# Patient Record
Sex: Female | Born: 1935 | Race: Black or African American | Hispanic: No | Smoking: Never smoker
Health system: Southern US, Community
[De-identification: ages and names within clinical notes are randomized; demographics above are authoritative.]

## PROBLEM LIST (undated history)

## (undated) DIAGNOSIS — I739 Peripheral vascular disease, unspecified: Secondary | ICD-10-CM

## (undated) DIAGNOSIS — R001 Bradycardia, unspecified: Secondary | ICD-10-CM

## (undated) DIAGNOSIS — I82409 Acute embolism and thrombosis of unspecified deep veins of unspecified lower extremity: Secondary | ICD-10-CM

## (undated) DIAGNOSIS — M353 Polymyalgia rheumatica: Secondary | ICD-10-CM

## (undated) DIAGNOSIS — N3941 Urge incontinence: Secondary | ICD-10-CM

## (undated) DIAGNOSIS — J309 Allergic rhinitis, unspecified: Secondary | ICD-10-CM

## (undated) DIAGNOSIS — G5790 Unspecified mononeuropathy of unspecified lower limb: Secondary | ICD-10-CM

## (undated) DIAGNOSIS — I6529 Occlusion and stenosis of unspecified carotid artery: Secondary | ICD-10-CM

## (undated) DIAGNOSIS — I10 Essential (primary) hypertension: Secondary | ICD-10-CM

## (undated) DIAGNOSIS — R6251 Failure to thrive (child): Secondary | ICD-10-CM

## (undated) DIAGNOSIS — R0600 Dyspnea, unspecified: Secondary | ICD-10-CM

## (undated) DIAGNOSIS — N189 Chronic kidney disease, unspecified: Secondary | ICD-10-CM

## (undated) DIAGNOSIS — M1990 Unspecified osteoarthritis, unspecified site: Secondary | ICD-10-CM

## (undated) DIAGNOSIS — F320 Major depressive disorder, single episode, mild: Secondary | ICD-10-CM

## (undated) DIAGNOSIS — R42 Dizziness and giddiness: Secondary | ICD-10-CM

## (undated) DIAGNOSIS — R1011 Right upper quadrant pain: Secondary | ICD-10-CM

## (undated) DIAGNOSIS — I679 Cerebrovascular disease, unspecified: Secondary | ICD-10-CM

## (undated) DIAGNOSIS — F32A Depression, unspecified: Secondary | ICD-10-CM

## (undated) DIAGNOSIS — E785 Hyperlipidemia, unspecified: Secondary | ICD-10-CM

## (undated) DIAGNOSIS — N318 Other neuromuscular dysfunction of bladder: Secondary | ICD-10-CM

## (undated) HISTORY — DX: Chronic kidney disease, unspecified: N18.9

## (undated) HISTORY — DX: Essential (primary) hypertension: I10

## (undated) HISTORY — DX: Cerebrovascular disease, unspecified: I67.9

## (undated) HISTORY — DX: Unspecified mononeuropathy of unspecified lower limb: G57.90

## (undated) HISTORY — DX: Hyperlipidemia, unspecified: E78.5

## (undated) HISTORY — DX: Occlusion and stenosis of unspecified carotid artery: I65.29

## (undated) HISTORY — DX: Major depressive disorder, single episode, mild: F32.0

## (undated) HISTORY — DX: Acute embolism and thrombosis of unspecified deep veins of unspecified lower extremity: I82.409

## (undated) HISTORY — DX: Depression, unspecified: F32.A

## (undated) HISTORY — DX: Urge incontinence: N39.41

## (undated) HISTORY — DX: Failure to thrive (child): R62.51

## (undated) HISTORY — DX: Allergic rhinitis, unspecified: J30.9

## (undated) HISTORY — DX: Peripheral vascular disease, unspecified: I73.9

## (undated) HISTORY — DX: Right upper quadrant pain: R10.11

## (undated) HISTORY — DX: Polymyalgia rheumatica: M35.3

## (undated) HISTORY — DX: Other neuromuscular dysfunction of bladder: N31.8

## (undated) HISTORY — DX: Dyspnea, unspecified: R06.00

## (undated) HISTORY — DX: Bradycardia, unspecified: R00.1

## (undated) HISTORY — DX: Dizziness and giddiness: R42

## (undated) HISTORY — DX: Unspecified osteoarthritis, unspecified site: M19.90

## (undated) HISTORY — PX: OTHER SURGICAL HISTORY: SHX169

## (undated) HISTORY — PX: CATARACT EXTRACTION: SUR2

---

## 1998-06-04 HISTORY — PX: CHOLECYSTECTOMY: SHX55

## 1999-07-06 HISTORY — PX: OTHER SURGICAL HISTORY: SHX169

## 1999-07-06 LAB — HM DEXA SCAN

## 2000-06-04 HISTORY — PX: OTHER SURGICAL HISTORY: SHX169

## 2000-09-02 HISTORY — PX: OTHER SURGICAL HISTORY: SHX169

## 2000-10-02 HISTORY — PX: OTHER SURGICAL HISTORY: SHX169

## 2002-05-19 LAB — FECAL OCCULT BLOOD, GUAIAC: Fecal Occult Blood: NEGATIVE

## 2002-06-04 HISTORY — PX: COLONOSCOPY: SHX174

## 2002-06-04 LAB — HM COLONOSCOPY

## 2002-11-03 HISTORY — PX: OTHER SURGICAL HISTORY: SHX169

## 2004-05-05 ENCOUNTER — Ambulatory Visit: Payer: Self-pay | Admitting: Family Medicine

## 2004-11-07 ENCOUNTER — Ambulatory Visit: Payer: Self-pay | Admitting: Family Medicine

## 2004-11-10 LAB — HM MAMMOGRAPHY: HM Mammogram: NORMAL

## 2004-11-29 ENCOUNTER — Ambulatory Visit: Payer: Self-pay | Admitting: Family Medicine

## 2004-12-08 ENCOUNTER — Ambulatory Visit: Payer: Self-pay | Admitting: Family Medicine

## 2004-12-22 ENCOUNTER — Ambulatory Visit: Payer: Self-pay | Admitting: Family Medicine

## 2005-01-02 HISTORY — PX: OTHER SURGICAL HISTORY: SHX169

## 2005-01-03 ENCOUNTER — Ambulatory Visit: Payer: Self-pay | Admitting: Cardiology

## 2005-01-19 ENCOUNTER — Ambulatory Visit: Payer: Self-pay

## 2005-02-02 ENCOUNTER — Ambulatory Visit: Payer: Self-pay | Admitting: Cardiology

## 2005-02-16 ENCOUNTER — Ambulatory Visit: Payer: Self-pay | Admitting: Cardiology

## 2005-02-16 ENCOUNTER — Ambulatory Visit: Payer: Self-pay

## 2005-02-22 ENCOUNTER — Ambulatory Visit: Payer: Self-pay | Admitting: Family Medicine

## 2005-04-09 ENCOUNTER — Ambulatory Visit: Payer: Self-pay | Admitting: Family Medicine

## 2005-05-01 ENCOUNTER — Ambulatory Visit: Payer: Self-pay | Admitting: Family Medicine

## 2005-07-04 ENCOUNTER — Ambulatory Visit: Payer: Self-pay | Admitting: Family Medicine

## 2005-07-30 ENCOUNTER — Ambulatory Visit: Payer: Self-pay | Admitting: Cardiology

## 2005-10-08 ENCOUNTER — Ambulatory Visit: Payer: Self-pay | Admitting: Family Medicine

## 2005-10-09 ENCOUNTER — Ambulatory Visit: Payer: Self-pay | Admitting: Family Medicine

## 2005-12-10 ENCOUNTER — Ambulatory Visit: Payer: Self-pay | Admitting: Family Medicine

## 2006-02-11 ENCOUNTER — Ambulatory Visit: Payer: Self-pay | Admitting: Family Medicine

## 2006-03-04 ENCOUNTER — Ambulatory Visit: Payer: Self-pay | Admitting: Cardiology

## 2006-03-04 ENCOUNTER — Ambulatory Visit: Payer: Self-pay

## 2006-04-23 ENCOUNTER — Ambulatory Visit: Payer: Self-pay | Admitting: Family Medicine

## 2006-04-29 ENCOUNTER — Ambulatory Visit: Payer: Self-pay | Admitting: Family Medicine

## 2006-09-09 ENCOUNTER — Ambulatory Visit: Payer: Self-pay | Admitting: Cardiology

## 2006-10-11 ENCOUNTER — Ambulatory Visit: Payer: Self-pay | Admitting: Family Medicine

## 2006-10-11 ENCOUNTER — Encounter: Payer: Self-pay | Admitting: Family Medicine

## 2006-10-11 DIAGNOSIS — I6529 Occlusion and stenosis of unspecified carotid artery: Secondary | ICD-10-CM | POA: Insufficient documentation

## 2006-10-11 DIAGNOSIS — I10 Essential (primary) hypertension: Secondary | ICD-10-CM | POA: Insufficient documentation

## 2006-10-11 DIAGNOSIS — N318 Other neuromuscular dysfunction of bladder: Secondary | ICD-10-CM | POA: Insufficient documentation

## 2006-10-11 DIAGNOSIS — I679 Cerebrovascular disease, unspecified: Secondary | ICD-10-CM | POA: Insufficient documentation

## 2006-10-11 DIAGNOSIS — N189 Chronic kidney disease, unspecified: Secondary | ICD-10-CM | POA: Insufficient documentation

## 2006-10-11 DIAGNOSIS — G5790 Unspecified mononeuropathy of unspecified lower limb: Secondary | ICD-10-CM | POA: Insufficient documentation

## 2006-10-11 DIAGNOSIS — I519 Heart disease, unspecified: Secondary | ICD-10-CM | POA: Insufficient documentation

## 2006-10-11 DIAGNOSIS — N184 Chronic kidney disease, stage 4 (severe): Secondary | ICD-10-CM | POA: Insufficient documentation

## 2006-10-11 DIAGNOSIS — N183 Chronic kidney disease, stage 3 unspecified: Secondary | ICD-10-CM | POA: Insufficient documentation

## 2006-10-11 DIAGNOSIS — M1990 Unspecified osteoarthritis, unspecified site: Secondary | ICD-10-CM | POA: Insufficient documentation

## 2006-10-11 DIAGNOSIS — J309 Allergic rhinitis, unspecified: Secondary | ICD-10-CM | POA: Insufficient documentation

## 2006-10-11 DIAGNOSIS — I739 Peripheral vascular disease, unspecified: Secondary | ICD-10-CM | POA: Insufficient documentation

## 2006-10-11 DIAGNOSIS — M353 Polymyalgia rheumatica: Secondary | ICD-10-CM | POA: Insufficient documentation

## 2006-10-11 DIAGNOSIS — Z8679 Personal history of other diseases of the circulatory system: Secondary | ICD-10-CM | POA: Insufficient documentation

## 2006-10-11 DIAGNOSIS — E785 Hyperlipidemia, unspecified: Secondary | ICD-10-CM | POA: Insufficient documentation

## 2006-10-14 LAB — CONVERTED CEMR LAB
ALT: 19 units/L (ref 0–40)
AST: 32 units/L (ref 0–37)
Folate: 16.7 ng/mL
LDL Cholesterol: 114 mg/dL — ABNORMAL HIGH (ref 0–99)
Total CHOL/HDL Ratio: 4.7
Triglycerides: 78 mg/dL (ref 0–149)

## 2007-03-19 ENCOUNTER — Encounter: Payer: Self-pay | Admitting: Family Medicine

## 2007-03-21 ENCOUNTER — Ambulatory Visit: Payer: Self-pay | Admitting: Family Medicine

## 2007-07-25 ENCOUNTER — Telehealth: Payer: Self-pay | Admitting: Family Medicine

## 2007-09-03 DIAGNOSIS — I6529 Occlusion and stenosis of unspecified carotid artery: Secondary | ICD-10-CM

## 2007-09-03 HISTORY — DX: Occlusion and stenosis of unspecified carotid artery: I65.29

## 2007-09-09 ENCOUNTER — Ambulatory Visit: Payer: Self-pay

## 2007-09-09 ENCOUNTER — Ambulatory Visit: Payer: Self-pay | Admitting: Internal Medicine

## 2008-02-05 ENCOUNTER — Ambulatory Visit: Payer: Self-pay | Admitting: Internal Medicine

## 2008-02-27 ENCOUNTER — Ambulatory Visit: Payer: Self-pay | Admitting: Family Medicine

## 2008-02-27 DIAGNOSIS — N3941 Urge incontinence: Secondary | ICD-10-CM | POA: Insufficient documentation

## 2008-02-27 LAB — CONVERTED CEMR LAB
Bilirubin Urine: NEGATIVE
Blood in Urine, dipstick: NEGATIVE
Ketones, urine, test strip: NEGATIVE
Nitrite: POSITIVE
Specific Gravity, Urine: <1.005

## 2008-02-28 ENCOUNTER — Encounter: Payer: Self-pay | Admitting: Family Medicine

## 2008-03-01 LAB — CONVERTED CEMR LAB
ALT: 18 units/L (ref 0–35)
Albumin: 3.4 g/dL — ABNORMAL LOW (ref 3.5–5.2)
BUN: 18 mg/dL (ref 6–23)
CO2: 31 meq/L (ref 19–32)
Chloride: 103 meq/L (ref 96–112)
Cholesterol: 156 mg/dL (ref 0–200)
Creatinine, Ser: 1.1 mg/dL (ref 0.4–1.2)
GFR calc Af Amer: 61 mL/min
Glucose, Bld: 100 mg/dL — ABNORMAL HIGH (ref 70–99)
HDL: 35.2 mg/dL — ABNORMAL LOW (ref >39.0)
Sed Rate: 75 mm/hr — ABNORMAL HIGH (ref 0–22)
Total CHOL/HDL Ratio: 4.4
Triglycerides: 61 mg/dL (ref 0–149)

## 2008-03-04 ENCOUNTER — Ambulatory Visit: Payer: Self-pay

## 2008-03-04 ENCOUNTER — Ambulatory Visit: Payer: Self-pay | Admitting: Family Medicine

## 2008-03-05 ENCOUNTER — Encounter: Payer: Self-pay | Admitting: Family Medicine

## 2008-05-21 ENCOUNTER — Ambulatory Visit: Payer: Self-pay | Admitting: Internal Medicine

## 2008-05-21 ENCOUNTER — Telehealth (INDEPENDENT_AMBULATORY_CARE_PROVIDER_SITE_OTHER): Payer: Self-pay | Admitting: Internal Medicine

## 2008-05-24 ENCOUNTER — Telehealth (INDEPENDENT_AMBULATORY_CARE_PROVIDER_SITE_OTHER): Payer: Self-pay | Admitting: Internal Medicine

## 2008-05-25 ENCOUNTER — Encounter (INDEPENDENT_AMBULATORY_CARE_PROVIDER_SITE_OTHER): Payer: Self-pay | Admitting: Internal Medicine

## 2008-05-25 HISTORY — PX: CT SCAN: SHX5351

## 2008-06-01 ENCOUNTER — Encounter (INDEPENDENT_AMBULATORY_CARE_PROVIDER_SITE_OTHER): Payer: Self-pay | Admitting: Internal Medicine

## 2008-08-05 ENCOUNTER — Telehealth: Payer: Self-pay | Admitting: Family Medicine

## 2008-10-26 DIAGNOSIS — I498 Other specified cardiac arrhythmias: Secondary | ICD-10-CM | POA: Insufficient documentation

## 2008-10-26 DIAGNOSIS — R0602 Shortness of breath: Secondary | ICD-10-CM | POA: Insufficient documentation

## 2008-11-05 ENCOUNTER — Ambulatory Visit: Payer: Self-pay | Admitting: Internal Medicine

## 2008-11-05 ENCOUNTER — Encounter: Payer: Self-pay | Admitting: Internal Medicine

## 2008-11-30 ENCOUNTER — Ambulatory Visit: Payer: Self-pay

## 2008-12-01 ENCOUNTER — Encounter: Payer: Self-pay | Admitting: Internal Medicine

## 2009-01-11 ENCOUNTER — Ambulatory Visit: Payer: Self-pay | Admitting: Family Medicine

## 2009-01-12 LAB — CONVERTED CEMR LAB
Basophils Relative: 0.2 % (ref 0.0–3.0)
CO2: 32 meq/L (ref 19–32)
Chloride: 97 meq/L (ref 96–112)
Cholesterol: 158 mg/dL (ref 0–200)
Creatinine, Ser: 1.3 mg/dL — ABNORMAL HIGH (ref 0.4–1.2)
Eosinophils Absolute: 0.1 10*3/uL (ref 0.0–0.7)
Eosinophils Relative: 0.7 % (ref 0.0–5.0)
HCT: 35.9 % — ABNORMAL LOW (ref 36.0–46.0)
Hemoglobin: 12.1 g/dL (ref 12.0–15.0)
LDL Cholesterol: 114 mg/dL — ABNORMAL HIGH (ref 0–99)
MCHC: 33.6 g/dL (ref 30.0–36.0)
MCV: 87.4 fL (ref 78.0–100.0)
Monocytes Absolute: 1.2 10*3/uL — ABNORMAL HIGH (ref 0.1–1.0)
Neutro Abs: 4.9 10*3/uL (ref 1.4–7.7)
Neutrophils Relative %: 58.2 % (ref 43.0–77.0)
Potassium: 3.4 meq/L — ABNORMAL LOW (ref 3.5–5.1)
RBC: 4.1 M/uL (ref 3.87–5.11)
Sodium: 138 meq/L (ref 135–145)
Total CHOL/HDL Ratio: 5
VLDL: 12.8 mg/dL (ref 0.0–40.0)
WBC: 8.3 10*3/uL (ref 4.5–10.5)

## 2009-01-17 ENCOUNTER — Telehealth (INDEPENDENT_AMBULATORY_CARE_PROVIDER_SITE_OTHER): Payer: Self-pay | Admitting: Internal Medicine

## 2009-01-25 ENCOUNTER — Ambulatory Visit: Payer: Self-pay | Admitting: Family Medicine

## 2009-01-25 DIAGNOSIS — E875 Hyperkalemia: Secondary | ICD-10-CM | POA: Insufficient documentation

## 2009-01-26 LAB — CONVERTED CEMR LAB: Vitamin B-12: 504 pg/mL (ref 211–911)

## 2009-02-09 ENCOUNTER — Telehealth: Payer: Self-pay | Admitting: Family Medicine

## 2009-03-03 ENCOUNTER — Ambulatory Visit: Payer: Self-pay | Admitting: Family Medicine

## 2009-04-07 ENCOUNTER — Ambulatory Visit: Payer: Self-pay | Admitting: Family Medicine

## 2009-04-07 DIAGNOSIS — R609 Edema, unspecified: Secondary | ICD-10-CM | POA: Insufficient documentation

## 2009-04-11 ENCOUNTER — Encounter: Payer: Self-pay | Admitting: Family Medicine

## 2009-04-12 ENCOUNTER — Telehealth: Payer: Self-pay | Admitting: Family Medicine

## 2009-04-12 ENCOUNTER — Ambulatory Visit: Payer: Self-pay | Admitting: Family Medicine

## 2009-04-12 ENCOUNTER — Ambulatory Visit: Payer: Self-pay

## 2009-04-12 DIAGNOSIS — I82409 Acute embolism and thrombosis of unspecified deep veins of unspecified lower extremity: Secondary | ICD-10-CM | POA: Insufficient documentation

## 2009-04-15 ENCOUNTER — Ambulatory Visit: Payer: Self-pay | Admitting: Family Medicine

## 2009-04-15 LAB — CONVERTED CEMR LAB
Bilirubin Urine: NEGATIVE
Glucose, Urine, Semiquant: NEGATIVE
Prothrombin Time: 15.2 s
Specific Gravity, Urine: 1.01
Urine crystals, microscopic: 0 /hpf
Urobilinogen, UA: 0.2
pH: 7

## 2009-04-16 ENCOUNTER — Encounter: Payer: Self-pay | Admitting: Family Medicine

## 2009-04-18 ENCOUNTER — Ambulatory Visit: Payer: Self-pay | Admitting: Family Medicine

## 2009-04-18 LAB — CONVERTED CEMR LAB: Prothrombin Time: 19.8 s

## 2009-04-21 ENCOUNTER — Ambulatory Visit: Payer: Self-pay | Admitting: Family Medicine

## 2009-04-27 ENCOUNTER — Ambulatory Visit: Payer: Self-pay | Admitting: Family Medicine

## 2009-05-02 LAB — CONVERTED CEMR LAB: Prothrombin Time: 30.6 s — ABNORMAL HIGH (ref 11.6–15.2)

## 2009-05-06 ENCOUNTER — Ambulatory Visit: Payer: Self-pay | Admitting: Family Medicine

## 2009-05-07 ENCOUNTER — Encounter: Payer: Self-pay | Admitting: Family Medicine

## 2009-05-09 LAB — CONVERTED CEMR LAB
Albumin: 3.2 g/dL — ABNORMAL LOW (ref 3.5–5.2)
Chloride: 105 meq/L (ref 96–112)
GFR calc non Af Amer: 34.18 mL/min (ref >60)
Phosphorus: 4.1 mg/dL (ref 2.3–4.6)
Potassium: 4.2 meq/L (ref 3.5–5.1)

## 2009-05-13 ENCOUNTER — Ambulatory Visit: Payer: Self-pay | Admitting: Family Medicine

## 2009-05-13 LAB — CONVERTED CEMR LAB: INR: 1.4

## 2009-05-17 ENCOUNTER — Ambulatory Visit: Payer: Self-pay | Admitting: Family Medicine

## 2009-05-17 LAB — CONVERTED CEMR LAB
INR: 2.8
Prothrombin Time: 20.4 s

## 2009-05-24 ENCOUNTER — Telehealth: Payer: Self-pay | Admitting: Family Medicine

## 2009-05-31 ENCOUNTER — Ambulatory Visit: Payer: Self-pay | Admitting: Family Medicine

## 2009-05-31 LAB — CONVERTED CEMR LAB: INR: 2.9

## 2009-06-01 ENCOUNTER — Telehealth: Payer: Self-pay | Admitting: Family Medicine

## 2009-06-28 ENCOUNTER — Ambulatory Visit: Payer: Self-pay | Admitting: Family Medicine

## 2009-06-28 LAB — CONVERTED CEMR LAB: Prothrombin Time: 24 s

## 2009-07-05 ENCOUNTER — Ambulatory Visit: Payer: Self-pay | Admitting: Family Medicine

## 2009-07-05 LAB — CONVERTED CEMR LAB: Prothrombin Time: 19.9 s

## 2009-08-02 ENCOUNTER — Ambulatory Visit: Payer: Self-pay | Admitting: Family Medicine

## 2009-08-02 LAB — CONVERTED CEMR LAB: INR: 3.5

## 2009-08-08 ENCOUNTER — Ambulatory Visit: Payer: Self-pay | Admitting: Family Medicine

## 2009-08-08 DIAGNOSIS — H900 Conductive hearing loss, bilateral: Secondary | ICD-10-CM | POA: Insufficient documentation

## 2009-08-08 LAB — CONVERTED CEMR LAB
Creatinine, Ser: 2.8 mg/dL — ABNORMAL HIGH (ref 0.4–1.2)
GFR calc non Af Amer: 20.51 mL/min (ref >60)
Glucose, Bld: 89 mg/dL (ref 70–99)
Phosphorus: 5.1 mg/dL — ABNORMAL HIGH (ref 2.3–4.6)
Sodium: 143 meq/L (ref 135–145)

## 2009-08-12 ENCOUNTER — Telehealth: Payer: Self-pay | Admitting: Family Medicine

## 2009-08-29 ENCOUNTER — Ambulatory Visit: Payer: Self-pay | Admitting: Family Medicine

## 2009-08-29 ENCOUNTER — Other Ambulatory Visit: Admission: RE | Admit: 2009-08-29 | Discharge: 2009-08-29 | Payer: Self-pay | Admitting: Family Medicine

## 2009-08-29 DIAGNOSIS — R3129 Other microscopic hematuria: Secondary | ICD-10-CM | POA: Insufficient documentation

## 2009-08-29 LAB — CONVERTED CEMR LAB
Casts: 0 /lpf
Glucose, Urine, Semiquant: NEGATIVE
Specific Gravity, Urine: 1.01
pH: 6

## 2009-08-29 LAB — HM PAP SMEAR

## 2009-08-30 ENCOUNTER — Encounter: Payer: Self-pay | Admitting: Family Medicine

## 2009-08-31 ENCOUNTER — Telehealth: Payer: Self-pay | Admitting: Family Medicine

## 2009-09-02 ENCOUNTER — Ambulatory Visit: Payer: Self-pay | Admitting: Family Medicine

## 2009-09-02 LAB — CONVERTED CEMR LAB: INR: 3.3

## 2009-09-05 ENCOUNTER — Encounter: Payer: Self-pay | Admitting: Family Medicine

## 2009-09-05 ENCOUNTER — Encounter (INDEPENDENT_AMBULATORY_CARE_PROVIDER_SITE_OTHER): Payer: Self-pay | Admitting: *Deleted

## 2009-09-05 ENCOUNTER — Telehealth: Payer: Self-pay | Admitting: Family Medicine

## 2009-09-05 LAB — CONVERTED CEMR LAB: Pap Smear: NEGATIVE

## 2009-09-13 ENCOUNTER — Ambulatory Visit: Payer: Self-pay | Admitting: Family Medicine

## 2009-09-16 ENCOUNTER — Telehealth: Payer: Self-pay | Admitting: Family Medicine

## 2009-09-23 ENCOUNTER — Encounter: Payer: Self-pay | Admitting: Family Medicine

## 2009-10-03 ENCOUNTER — Telehealth: Payer: Self-pay | Admitting: Family Medicine

## 2009-10-11 ENCOUNTER — Ambulatory Visit: Payer: Self-pay | Admitting: Family Medicine

## 2009-10-11 LAB — CONVERTED CEMR LAB
INR: 3
Prothrombin Time: 35.8 s

## 2009-10-19 ENCOUNTER — Ambulatory Visit: Payer: Self-pay | Admitting: Urology

## 2009-11-08 ENCOUNTER — Ambulatory Visit: Payer: Self-pay | Admitting: Family Medicine

## 2009-11-08 LAB — CONVERTED CEMR LAB
INR: 4.6
Prothrombin Time: 55 s

## 2009-11-09 ENCOUNTER — Encounter: Payer: Self-pay | Admitting: Internal Medicine

## 2009-11-10 ENCOUNTER — Encounter: Payer: Self-pay | Admitting: Internal Medicine

## 2009-11-10 ENCOUNTER — Ambulatory Visit: Payer: Self-pay

## 2009-11-14 ENCOUNTER — Ambulatory Visit: Payer: Self-pay | Admitting: Family Medicine

## 2009-11-14 LAB — CONVERTED CEMR LAB: Prothrombin Time: 40.9 s

## 2009-11-17 ENCOUNTER — Encounter: Payer: Self-pay | Admitting: Family Medicine

## 2009-11-22 ENCOUNTER — Ambulatory Visit: Payer: Self-pay | Admitting: Family Medicine

## 2009-11-28 ENCOUNTER — Ambulatory Visit: Payer: Self-pay | Admitting: Family Medicine

## 2009-11-28 LAB — CONVERTED CEMR LAB
INR: 4.4
Prothrombin Time: 53 s

## 2009-12-06 ENCOUNTER — Ambulatory Visit: Payer: Self-pay | Admitting: Family Medicine

## 2009-12-06 LAB — CONVERTED CEMR LAB: INR: 3.3

## 2009-12-20 ENCOUNTER — Ambulatory Visit: Payer: Self-pay | Admitting: Family Medicine

## 2009-12-20 LAB — CONVERTED CEMR LAB: INR: 2.7

## 2009-12-22 ENCOUNTER — Telehealth: Payer: Self-pay | Admitting: Family Medicine

## 2010-01-17 ENCOUNTER — Ambulatory Visit: Payer: Self-pay | Admitting: Family Medicine

## 2010-01-17 LAB — CONVERTED CEMR LAB: Prothrombin Time: 49.5 s

## 2010-01-18 ENCOUNTER — Telehealth (INDEPENDENT_AMBULATORY_CARE_PROVIDER_SITE_OTHER): Payer: Self-pay | Admitting: *Deleted

## 2010-01-25 ENCOUNTER — Ambulatory Visit: Payer: Self-pay | Admitting: Family Medicine

## 2010-01-25 LAB — CONVERTED CEMR LAB
INR: 2.4
Prothrombin Time: 29.3 s

## 2010-02-08 ENCOUNTER — Ambulatory Visit: Payer: Self-pay | Admitting: Family Medicine

## 2010-02-10 ENCOUNTER — Encounter: Payer: Self-pay | Admitting: Family Medicine

## 2010-02-24 ENCOUNTER — Ambulatory Visit: Payer: Self-pay | Admitting: Family Medicine

## 2010-02-24 DIAGNOSIS — R634 Abnormal weight loss: Secondary | ICD-10-CM | POA: Insufficient documentation

## 2010-02-24 LAB — CONVERTED CEMR LAB: INR: 2.2

## 2010-02-27 LAB — CONVERTED CEMR LAB
ALT: 6 units/L (ref 0–35)
BUN: 45 mg/dL — ABNORMAL HIGH (ref 6–23)
CO2: 23 meq/L (ref 19–32)
Chloride: 103 meq/L (ref 96–112)
GFR calc non Af Amer: 19.44 mL/min (ref >60)
HDL: 31.7 mg/dL — ABNORMAL LOW (ref >39.00)
VLDL: 19.2 mg/dL (ref 0.0–40.0)

## 2010-02-28 ENCOUNTER — Telehealth: Payer: Self-pay | Admitting: Family Medicine

## 2010-03-13 ENCOUNTER — Encounter: Payer: Self-pay | Admitting: Family Medicine

## 2010-03-24 ENCOUNTER — Ambulatory Visit: Payer: Self-pay | Admitting: Family Medicine

## 2010-03-31 ENCOUNTER — Ambulatory Visit: Payer: Self-pay | Admitting: Family Medicine

## 2010-03-31 LAB — CONVERTED CEMR LAB
INR: 3.4
Prothrombin Time: 40.7 s

## 2010-04-12 ENCOUNTER — Ambulatory Visit: Payer: Self-pay | Admitting: Family Medicine

## 2010-04-12 LAB — CONVERTED CEMR LAB
INR: 3.5
Prothrombin Time: 41.5 s

## 2010-05-02 ENCOUNTER — Telehealth: Payer: Self-pay | Admitting: Family Medicine

## 2010-05-05 ENCOUNTER — Emergency Department: Payer: Self-pay | Admitting: Emergency Medicine

## 2010-05-05 ENCOUNTER — Ambulatory Visit: Payer: Self-pay | Admitting: Nephrology

## 2010-05-05 ENCOUNTER — Encounter: Payer: Self-pay | Admitting: Family Medicine

## 2010-05-30 ENCOUNTER — Encounter: Payer: Self-pay | Admitting: Family Medicine

## 2010-06-06 ENCOUNTER — Ambulatory Visit
Admission: RE | Admit: 2010-06-06 | Discharge: 2010-06-06 | Payer: Self-pay | Source: Home / Self Care | Attending: Family Medicine | Admitting: Family Medicine

## 2010-06-06 DIAGNOSIS — R071 Chest pain on breathing: Secondary | ICD-10-CM | POA: Insufficient documentation

## 2010-06-15 ENCOUNTER — Ambulatory Visit: Payer: Self-pay | Admitting: Internal Medicine

## 2010-06-19 ENCOUNTER — Ambulatory Visit
Admission: RE | Admit: 2010-06-19 | Discharge: 2010-06-19 | Payer: Self-pay | Source: Home / Self Care | Attending: Family Medicine | Admitting: Family Medicine

## 2010-06-19 DIAGNOSIS — R079 Chest pain, unspecified: Secondary | ICD-10-CM | POA: Insufficient documentation

## 2010-06-22 ENCOUNTER — Encounter: Payer: Self-pay | Admitting: Family Medicine

## 2010-07-02 LAB — CONVERTED CEMR LAB
BUN: 20 mg/dL (ref 6–23)
Basophils Absolute: 0.1 10*3/uL (ref 0.0–0.1)
CO2: 33 meq/L — ABNORMAL HIGH (ref 19–32)
Calcium: 8.9 mg/dL (ref 8.4–10.5)
Creatinine, Ser: 1.7 mg/dL — ABNORMAL HIGH (ref 0.4–1.2)
Eosinophils Absolute: 0.2 10*3/uL (ref 0.0–0.7)
GFR calc non Af Amer: 36.52 mL/min (ref >60)
Glucose, Bld: 92 mg/dL (ref 70–99)
HCT: 30 % — ABNORMAL LOW (ref 36.0–46.0)
Homocysteine: 22.4 micromoles/L — ABNORMAL HIGH (ref 4.0–15.4)
INR: 1.1 — ABNORMAL HIGH (ref 0.8–1.0)
Lymphs Abs: 2.1 10*3/uL (ref 0.7–4.0)
MCV: 88.8 fL (ref 78.0–100.0)
Monocytes Absolute: 0.6 10*3/uL (ref 0.1–1.0)
Neutrophils Relative %: 36.9 % — ABNORMAL LOW (ref 43.0–77.0)
Platelets: 358 10*3/uL (ref 150.0–400.0)
Prothrombin Time: 11 s (ref 9.1–11.7)
RDW: 13.9 % (ref 11.5–14.6)
Total Bilirubin: 0.7 mg/dL (ref 0.3–1.2)

## 2010-07-04 NOTE — Progress Notes (Signed)
Summary: Pt tired and weak  Phone Note Call from Patient Call back at [phone]   Caller: Tracy Morales pt's daughter Call For: Judith Part MD Summary of Call: Pt's daughter called and said Pt's kidney dr. told her pt's blood was low after being seen on 02/10/10. Tracy Morales asked if pt needed to take any med for low blood and was told by nurse at Kidney dr's office no to continue what she had been taking.  Tracy Morales wants to know if that is why she  is so tired and weak. Does pt need to take anything because her blood is low. there is a note from [facility] on 02/10/10 in EMR.Please advise. [facility]. Initial call taken by: Lewanda Rife LPN,  February 28, 2010 10:46 AM  Follow-up for Phone Call        did they tell her not to continue what she was taking or to continue ? (phone note is unclear) Follow-up by: Judith Part MD,  February 28, 2010 11:17 AM  Additional Follow-up for Phone Call Additional follow up Details #1::        I'm sorry. Pt was told to continue what she was taking.Lewanda Rife LPN  February 28, 2010 2:09 PM  thanks for that clarification   her anemia is not really bad enough to cause the fatigue she is having (it is quite mild) and likely coming from her age/ chronic dz and kidney function her renal dr would not recommend med for this unless it was much worse I think her fatigue is multifactorial -- and age is playing a role as well as chronic medical problems  please keep me updated of any new symptoms and by all means they should also let her renal Dr know how she has been feeling as well Additional Follow-up by: Judith Part MD,  February 28, 2010 4:56 PM    Additional Follow-up for Phone Call Additional follow up Details #2::    Patient's daughter Tracy Morales notified as instructed by telephone. Lewanda Rife LPN  February 28, 2010 5:17 PM

## 2010-07-04 NOTE — Assessment & Plan Note (Signed)
Summary: 3 MONTH FOLLOW UP/[facility]   Vital Signs:  Patient profile:   77 year old female Height:      63.5 inches Weight:      143.25 pounds Temp:     97.3 degrees F oral Pulse rate:   68 / minute Pulse rhythm:   regular BP sitting:   138 / 80  (left arm) Cuff size:   regular  Vitals Entered By: Lewanda Rife LPN (August 09, 5407 11:19 AM)  History of Present Illness: here for f/u mult med prob incl HTN/ , renal insuff/ DVT  last visit cr 1.8- needs to be re checked  is doing pretty good with water intake -- almost too much  is more thirsty lately  was having some utis at that time  is thinking about compression stockings for veins - but they are hard to put on   suppl K - that was better   still on coumadin for dvt and doing well overall  will be done with that in may for 6 mo course   struggles with arthritis - esp in her legs   no swelling- from DVt is gone entirely   used to take darvocet occ for pain -- was taken off market  is getting by with tylenol overall   still has incontinence -- bladder leaks all the time - has to wear depends  no burning or odor or frequency   hearing is worse - ? wax in ears   no longer on extra K -- just a multi vit , needs to have that checked today    Allergies: 1)  Ace Inhibitors  Past History:  Past Medical History: Last updated: 04/12/2009 Dyspnea     --echo with normal EF. +diastolic dysfunction CAROTID STENOSIS (ICD-433.10)     --u/s 4/09, L 40-59% R 0-39%  stable for many years.  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 basilar insuffiency (2002) Dexa- osteopenia (07/1999) Carotid doppler- non significant stenosis (01/2005) Zoster (10/2000) Colonoscopy- polyps (06/2002) Trigger fingercontracture (11/2002) 2D Echo- mild aortic sclerosis (01/2005) Carotid doppler 40% R, 40-60% L CT abd and pelvis--inflated urinary bladder and large amt stool throughout without blockage---05/25/08 R DVT 11/10  Family History: Last updated: 04/15/2009 Father: Heart problems, ? HTN Mother:  Siblings: sister- benign breast nodule brother - blood clot   Social History: Last updated: 10/26/2008 Marital Status: widowed Children: 2 Occupation: retired non smoker no alcohol Regular Exercise - yes Drug Use - no  Risk Factors: Exercise: yes (10/26/2008)  Risk Factors: Smoking Status: never (10/11/2006)  Review of Systems General:  Denies fatigue, fever, loss of appetite, and malaise. Eyes:  Denies blurring and eye irritation. CV:  Denies chest pain or discomfort, lightheadness, near fainting, and palpitations. Resp:  Denies cough, shortness of breath, and wheezing. GI:  Denies abdominal pain, change in bowel habits, indigestion, nausea, and vomiting. GU:  Complains of incontinence; denies discharge, dysuria, and urinary frequency. MS:  Complains of joint pain and stiffness; denies muscle aches, cramps, and muscle weakness. Derm:  Denies itching, lesion(s), poor wound healing,  and rash. Neuro:  Denies numbness, tingling, visual disturbances, and weakness. Psych:  mood is ok. Endo:  Denies cold intolerance, excessive thirst, excessive urination, and heat intolerance. Heme:  Denies abnormal bruising and bleeding.  Physical Exam  General:  elderly and  frail appearing- in good spirits  Head:  normocephalic, atraumatic, and no abnormalities observed.   Eyes:  vision grossly intact, pupils equal, pupils round, and pupils reactive to light.  no conjunctival pallor, injection or icterus  Ears:  R ear normal and L ear normal.  - no cerumen hearing seems to be mildly impaired  Nose:  no nasal discharge.   Mouth:  pharynx pink and moist.   Neck:  supple with full rom and no masses or thyromegally, no JVD or carotid bruit  Chest Wall:  No deformities, masses, or tenderness noted. Lungs:  normal respiratory effort, no intercostal retractions, no accessory muscle use, normal breath sounds, no crackles, and no wheezes.   Heart:  RRR stable systolic M  no gallops Abdomen:  soft, non-tender, and normal bowel sounds.no renal bruits (noted on sitting)   Msk:  poor rom L knee with some crepitice- suspect OA  Pulses:  R and L carotid,radial,femoral,dorsalis pedis and posterior tibial pulses are full and equal bilaterally Extremities:  no cce today Neurologic:  sensation intact to light touch, gait normal, and DTRs symmetrical and normal.   Skin:  Intact without suspicious lesions or rashes Cervical Nodes:  No lymphadenopathy noted Psych:  normal affect, talkative and pleasant  is mentally sharp   Impression & Recommendations:  Problem # 1:  DVT (ICD-453.40) Assessment Improved overall good recovery with coumadin- to complete 6 mo course  no more swelling or pain at all adv to update if any return of symptoms continue protimes   Problem # 2:  EDEMA (ICD-782.3) Assessment: Improved resolved currently disc opt of supp hose in future for comfort and varicose veins   Problem # 3:  HYPERPOTASSEMIA (ICD-276.7) Assessment: Unchanged check level today- now just on mvi Orders: Venipuncture (78469) TLB-Renal Function Panel (80069-RENAL)  Problem # 4:  HYPERTENSION (ICD-401.9) Assessment: Unchanged bp remains controlled - without change / no meds  currently continues to watch sodium in diet  BP today: 138/80 Prior BP: 142/70 (05/06/2009)  Labs Reviewed: K+: 4.2 (05/06/2009) Creat: : 1.8 (05/06/2009)   Chol: 158 (01/11/2009)   HDL: 31.40 (01/11/2009)   LDL: 114 (01/11/2009)   TG: 64.0 (01/11/2009)  Problem # 5:  RENAL INSUFFICIENCY, CHRONIC (ICD-585.9) Assessment: Unchanged  ongoing - with poss multiple etiol in elderly female with hx of vasc dz and HTN  re check today with better water intake and resolution of uti and update if worse would consider renal work up  Orders: Venipuncture (62952) TLB-Renal Function Panel (80069-RENAL)  Labs Reviewed: BUN: 24 (05/06/2009)   Cr: 1.8 (05/06/2009)    Hgb: 10.2 (04/12/2009)   Hct: 30.0 (04/12/2009)   Ca++: 9.2 (05/06/2009)   Phos: 4.1 (05/06/2009) TP: 7.6 (04/12/2009)   Alb: 3.2 (05/06/2009)  Problem # 6:  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 Take one by mouth daily 2)  Mirtazapine 15 Mg Tabs (Mirtazapine) .... Take one by mouth at bedtime 3)  Meclizine Hcl 25 Mg Tabs (Meclizine hcl) .... Take by mouth as directed prn 4)  Coumadin 5 Mg Tabs (Warfarin sodium) .... Take  by mouth as directed 5)  Daily Multi Tabs (Multiple vitamins-minerals) .... Take one tab two times a day 6)  Coumadin 5 Mg Tabs (Warfarin sodium) .... Take by mouth as directed 7)  Tylenol Extra Strength 500 Mg Tabs (Acetaminophen) .... Otc as directed.  Patient Instructions: 1)  labs today for kidney function 2)  continue tylenol for pain as needed but update me if you need something stronger 3)  ears are clear-- if you want to see a hearing specialist in future update me  4)  I will update you also about what  to do for potassium  Current Allergies (reviewed today): ACE INHIBITORS

## 2010-07-04 NOTE — Progress Notes (Signed)
Summary: citrate  Phone Note Call from Patient Call back at [facility] Phone [phone]   Caller: Daughter Summary of Call: Daughter says that her mom takes citrate of magnesium because she says that "it works for her". Daughter wants to know if it is safe for her mother to be taking it because of the sodium that is in it with Anthony's high BP. Please advise. She says that when she was at the kidney dr. it was 200/90 and that is when she started taking the amlodipine 5 mg. Initial call taken by: Melody Comas,  September 16, 2009 11:33 AM  Follow-up for Phone Call        it is probably fine - the sodium in other compounds is often different than sodium chloride which is what inc bp let me know daily dose she is taking please Follow-up by: Judith Part MD,  September 16, 2009 12:01 PM  Additional Follow-up for Phone Call Additional follow up Details #1::        Left message for patient's daughter to call back. Lewanda Rife LPN  September 16, 2009 5:13 PM   spoke with pt's daughter.  Pt takes 6-8 oz of magnesium citrate within 24hrs each time. Daughter not sure how much sodium is in the bottle. Pt's daughter knows Dr Milinda Antis has left and it will be next week for answer. She said no problem. Lewanda Rife LPN  September 16, 2009 5:30 PM     Additional Follow-up for Phone Call Additional follow up Details #2::    I did some research and use of magnesium citrate is not good for kidney- in fact is contraindicated in her because of her kidney issues -- she needs to stop it  she should let her renal physician know they have been using it - this may be important I recommend stool softener or miralax for constipation - both otc  Follow-up by: Judith Part MD,  September 20, 2009 11:37 AM  Additional Follow-up for Phone Call Additional follow up Details #3:: Details for Additional Follow-up Action Taken: Patient's daughter notified as instructed by telephone. Lewanda Rife LPN  September 20, 2009 12:48 PM

## 2010-07-04 NOTE — Progress Notes (Signed)
Summary: refill request for remeron  Phone Note Refill Request Message from:  Fax from Pharmacy  Refills Requested: Medication #1:  MIRTAZAPINE 15 MG TABS Take one by mouth at bedtime   Last Refilled: 07/12/2009 Faxed request from [facility], [phone].  Initial call taken by: Lowella Petties CMA,  August 12, 2009 8:17 AM  Follow-up for Phone Call        px written on EMR for call in  Follow-up by: Judith Part MD,  August 12, 2009 9:04 AM  Additional Follow-up for Phone Call Additional follow up Details #1::        Called to pharmacy. Additional Follow-up by: Lowella Petties CMA,  August 12, 2009 9:10 AM    Prescriptions: MIRTAZAPINE 15 MG TABS (MIRTAZAPINE) Take one by mouth at bedtime  #30 x 11   Entered and Authorized by:   Judith Part MD   Signed by:   Lowella Petties CMA on 08/12/2009   Method used:   Telephoned to ...       [facility]  [place] (retail)       [address]       [place], [place]  [address]       Ph: [phone] or [phone]       Fax: [phone]   RxID:   [id number]

## 2010-07-04 NOTE — Progress Notes (Signed)
Summary: what can she take for pain  Phone Note Call from Patient Call back at Home Phone [phone]   Caller: Patient Call For: Judith Part MD Summary of Call: Patient takes tylenol for pain. They have been to numberous stores and can't find any tylenol in the stores. She is on a blood thinner and wants to know what else she can take.  Initial call taken by: Melody Comas,  December 22, 2009 9:54 AM  Follow-up for Phone Call        tylenol is acetaminophen-- so any store brand of acetominophen is fine - can take 500 mg 2 pills up to every 4-6 hours  if brand is not avail the store brand is fine Follow-up by: Judith Part MD,  December 22, 2009 12:15 PM  Additional Follow-up for Phone Call Additional follow up Details #1::        Patient's daughter notified as instructed by telephone.Lewanda Rife LPN  December 22, 2009 12:25 PM

## 2010-07-04 NOTE — Consult Note (Signed)
Summary: [facility]   Imported By: Lanelle Bal 09/08/2009 14:25:47  _____________________________________________________________________  External Attachment:    Type:   Image     Comment:   External Document

## 2010-07-04 NOTE — Progress Notes (Signed)
Summary: losing weight  Phone Note Call from Patient Call back at Home Phone [phone]   Caller: Patient Call For: Judith Part MD Summary of Call: Daughter is calling to let you know that patient is losing weight again. She is not eating. Daughter doesn't know how much weight she has lost, but can tell by looking at her that she has lost weight. Daughter wants to know if you want her to start taking the mirtazapine again. [facility] [place]  Initial call taken by: Melody Comas,  January 18, 2010 10:03 AM  Follow-up for Phone Call        I think it is a good idea f/u with me in 1-2 mo  px written on EMR for call in  Follow-up by: Judith Part MD,  January 18, 2010 11:47 AM  Additional Follow-up for Phone Call Additional follow up Details #1::        Detailed message left for daughter [name]. Rx sent electronically Liane Comber CMA (AAMA)  January 18, 2010 1:03 PM     New/Updated Medications: REMERON 15 MG TABS (MIRTAZAPINE) 1 by mouth at bedtime Prescriptions: REMERON 15 MG TABS (MIRTAZAPINE) 1 by mouth at bedtime  #30 x 11   Entered by:   Liane Comber CMA (AAMA)   Authorized by:   Judith Part MD   Signed by:   Liane Comber CMA (AAMA) on 01/18/2010   Method used:   Electronically [facility]        [facility]  [place] (retail)       [address]       [place], [place]  [address]       Ph: [phone] or [phone]       Fax: [phone]   RxID:   [id number]

## 2010-07-04 NOTE — Letter (Signed)
Summary: Results Follow up Letter  [place] [facility]  [address] [place], [place] [address]   Phone: [phone]  Fax: [phone]    09/05/2009 MRN: [id number]    SHANITHA TWINING [address] [place], [place]  [address]    Dear Ms. Dacey,  The following are the results of your recent test(s):  Test         Result    Pap Smear:        Normal __X___  Not Normal _____ Comments: ______________________________________________________ Cholesterol: LDL(Bad cholesterol):         Your goal is less than:         HDL (Good cholesterol):       Your goal is more than: Comments:  ______________________________________________________ Mammogram:        Normal _____  Not Normal _____ Comments:  ___________________________________________________________________ Hemoccult:        Normal _____  Not normal _______ Comments:    _____________________________________________________________________ Other Tests:    We routinely do not discuss normal results over the telephone.  If you desire a copy of the results, or you have any questions about this information we can discuss them at your next office visit.   Sincerely,    Marne A. Milinda Antis, M.D.  MAT:lsf

## 2010-07-04 NOTE — Miscellaneous (Signed)
Summary: Warfarin 1mg   Medications Added WARFARIN SODIUM 1 MG TABS (WARFARIN SODIUM) take as directed by mouth       Clinical Lists Changes  Medications: Added new medication of WARFARIN SODIUM 1 MG TABS (WARFARIN SODIUM) take as directed by mouth - Signed Rx of WARFARIN SODIUM 1 MG TABS (WARFARIN SODIUM) take as directed by mouth;  #30 x 5;  Signed;  Entered by: Lewanda Rife LPN;  Authorized by: Colon Flattery Tower MD;  Method used: [facility]  [place] [place], [place]  [address], Ph: [phone] or [phone], Fax: [phone]    Prescriptions: WARFARIN SODIUM 1 MG TABS (WARFARIN SODIUM) take as directed by mouth  #30 x 5   Entered by:   Lewanda Rife LPN   Authorized by:   Judith Part MD   Signed by:   Lewanda Rife LPN on 40/34/7425   Method used:   Telephoned to ...       [facility]  [place] (retail)       [address]       [place], [place]  [address]       Ph: [phone] or [phone]       Fax: [phone]   RxID:   [id number]  Terri in lab asked me to call med into [facility].Lewanda Rife LPN  January 17, 2010 10:35 AM  Current Allergies: ACE INHIBITORS

## 2010-07-04 NOTE — Letter (Signed)
Summary: [facility]  [facility]   Imported By: Lanelle Bal 03/21/2010 10:33:10  _____________________________________________________________________  External Attachment:    Type:   Image     Comment:   External Document

## 2010-07-04 NOTE — Miscellaneous (Signed)
Summary: Orders Update  Clinical Lists Changes  Orders: Added new Test order of Carotid Duplex (Carotid Duplex) - Signed 

## 2010-07-04 NOTE — Consult Note (Signed)
Summary: [facility]   Imported By: Lanelle Bal 09/29/2009 12:21:10  _____________________________________________________________________  External Attachment:    Type:   Image     Comment:   External Document

## 2010-07-04 NOTE — Assessment & Plan Note (Signed)
Summary: KNOT ON ARM   Vital Signs:  Patient profile:   77 year old female Weight:      136 pounds BMI:     23.80 Temp:     98.0 degrees F oral Pulse rate:   64 / minute Pulse rhythm:   regular BP sitting:   120 / 68  (left arm) Cuff size:   regular  Vitals Entered By: Sydell Axon LPN (November 22, 2009 10:36 AM) CC: Bruise and knot on right arm, on Coumadin   History of Present Illness: Pt here with her daughrter for bruise to inner right forearm, which she noticed last Sat. It has a small knot in it and she was concerned about a possible blood clot. She has had no breathing probs and feels well otherwise. Is essentially at baseline. She uses a walker to help her ambulate. The buise has not increased since seeing it and the knot is nontender and also has not extended. Sheis on coumadin chronically, therapeutic as of her last check 6/13, slightly high the check befiore that.  Problems Prior to Update: 1)  Routine Gynecological Examination  (ICD-V72.31) 2)  Microscopic Hematuria  (ICD-599.72) 3)  Conductive Hearing Loss Bilateral  (ICD-389.06) 4)  Encounter For Therapeutic Drug Monitoring  (ICD-V58.83) 5)  Encounter For Long-term Use of Anticoagulants  (ICD-V58.61) 6)  Dvt  (ICD-453.40) 7)  Edema  (ICD-782.3) 8)  Hyperpotassemia  (ICD-276.7) 9)  Diastolic Dysfunction  (ICD-429.9) 10)  Carotid Stenosis  (ICD-433.10) 11)  Cardiac Murmur, Hx of  (ICD-V12.50) 12)  Peripheral Vascular Disease  (ICD-443.9) 13)  Bradycardia  (ICD-427.89) 14)  Hypertension  (ICD-401.9) 15)  Hyperlipidemia  (ICD-272.4) 16)  Dyspnea  (ICD-786.05) 17)  Incontinence, Urge  (ICD-788.31) 18)  Renal Insufficiency, Chronic  (ICD-585.9) 19)  Peripheral Neuropathy, Lower Extremities, Bilateral  (ICD-355.8) 20)  Cerebrovascular Disease  (ICD-437.9) 21)  Osteoarthritis  (ICD-715.90) 22)  Allergic Rhinitis  (ICD-477.9) 23)  Polymyalgia Rheumatica, Hx of  (ICD-725) 24)  Hx of Overactive Bladder   (ICD-596.51)  Medications Prior to Update: 1)  Zocor 20 Mg Tabs (Simvastatin) .... Take One By Mouth Daily 2)  Meclizine Hcl 25 Mg Tabs (Meclizine Hcl) .... Take By Mouth As Directed Prn 3)  Daily Multi  Tabs (Multiple Vitamins-Minerals) .... Take One Tab Two Times A Day 4)  Coumadin 5 Mg Tabs (Warfarin Sodium) .... Take By Mouth As Directed 5)  Tylenol Extra Strength 500 Mg Tabs (Acetaminophen) .... Otc As Directed. 6)  Septra Ds 800-160 Mg Tabs (Sulfamethoxazole-Trimethoprim) .Marland Kitchen.. 1 By Mouth Two Times A Day For 7 Days For Urinary Infection 7)  Walker With [employer] .... For Use With Ambulation Once Daily  429.9, 715.90, 355.9  Allergies: 1)  Ace Inhibitors  Physical Exam  General:  elderly and frail appearing- in good spirits  Head:  normocephalic, atraumatic, and no abnormalities observed.   Eyes:  vision grossly intact, pupils equal, pupils round, and pupils reactive to light.  no conj pallor  Skin:  R forearm medially with 2-3 cm ecymotic spot with superficial 5mm firm knot in the middle directly over a superficial vein. No tenderness and easily mobile.   Impression & Recommendations:  Problem # 1:  CONTUSION OF FOREARM (ICD-923.10) Assessment New Bruise to forearm with small hematoma formation to stop bleeding from superficial vein due to coumadin. Ressured. Should resolve without any further problem. Apply heat as often as possible. Last PT therapeutic one week ago. RTC if extends or becomes tender.  Complete Medication List: 1)  Zocor 20 Mg Tabs (Simvastatin) .... Take one by mouth daily 2)  Meclizine Hcl 25 Mg Tabs (Meclizine hcl) .... Take by mouth as directed prn 3)  Coumadin 5 Mg Tabs (Warfarin sodium) .... Take by mouth as directed 4)  Tylenol Extra Strength 500 Mg Tabs (Acetaminophen) .... Otc as directed. 5)  Walker With [employer]  .... For use with ambulation once daily  429.9, 715.90, 355.9 6)  Amlodipine Besylate 5 Mg Tabs (Amlodipine besylate) .... Take one by mouth  daily  Patient Instructions: 1)  RTC if enlarges.  Current Allergies (reviewed today): ACE INHIBITORS

## 2010-07-04 NOTE — Assessment & Plan Note (Signed)
Summary: vaginal bleeding/ alc   Vital Signs:  Patient profile:   77 year old female Height:      63.5 inches Weight:      144.75 pounds Temp:     98.2 degrees F oral Pulse rate:   68 / minute Pulse rhythm:   regular BP sitting:   152 / 78  (left arm) Cuff size:   regular  Vitals Entered By: Lewanda Rife LPN (August 29, 2009 11:56 AM) CC: on 08/27/09 saw bright red blood on pad in panty. No abdominal pain and no blood since Sat.   History of Present Illness: on 3/26 had episode of vaginal bleeding -- unusual for her has never had this before (no bleeding since 1956) on coumadin  no pain or d/c or gyn symptoms  not sexually active    gets up often at night -- uses bedside commode  urine is a bit darker than usual -- since her epidose  is good about drinking water  urine smelled funny   no burning to urinate  does urinate freqently , however   Allergies: 1)  Ace Inhibitors  Past History:  Past Medical History: Last updated: 04/12/2009 Dyspnea     --echo with normal EF. +diastolic dysfunction CAROTID STENOSIS (ICD-433.10)     --u/s 4/09, L 40-59% R 0-39%  stable for many years.  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 HTN Mother:  Siblings: sister- benign breast nodule brother - blood clot   Social History: Last updated: 10/26/2008 Marital Status: widowed Children: 2 Occupation: retired non smoker no alcohol Regular Exercise - yes Drug Use - no  Risk Factors: Exercise: yes (10/26/2008)  Risk Factors: Smoking Status: never (10/11/2006)  Review of Systems General:  Denies fatigue, fever, loss of appetite, and malaise. Eyes:  Denies blurring and eye irritation. CV:  Denies chest pain or discomfort, lightheadness, palpitations, shortness of breath with exertion, and swelling of feet. Resp:  Denies cough and wheezing. GI:  Denies abdominal pain, bloody stools, change in bowel habits, indigestion, nausea, and vomiting. GU:  Complains of hematuria, nocturia, and urinary frequency; denies discharge and dysuria. MS:  Denies low back pain and mid back pain. Derm:  Denies lesion(s), poor wound healing, and rash. Neuro:  Denies headaches, numbness, and tingling. Psych:  Denies anxiety and depression. Endo:  Denies cold intolerance, excessive thirst, excessive urination, and heat intolerance. Heme:  Denies abnormal bruising and enlarge lymph nodes.  Physical Exam  General:  elderly and frail appearing- in good spirits  Head:  normocephalic, atraumatic, and no abnormalities observed.   Eyes:  vision grossly intact, pupils equal, pupils round, and pupils reactive to light.  no conj pallor  Mouth:  pharynx pink and moist.   Neck:  supple with full rom and no masses or thyromegally, no JVD or carotid bruit  Lungs:  normal respiratory effort,  no intercostal retractions, no accessory muscle use, normal breath sounds, no crackles, and no wheezes.   Heart:  RRR stable systolic M  no gallops Abdomen:  Bowel sounds positive,abdomen soft and non-tender without masses, organomegaly or hernias noted. no suprapubic tenderness or fullness felt  Rectal:  No external abnormalities noted. Normal sphincter tone. No rectal masses or tenderness. heme neg stool  Genitalia:  no vaginal discharge, no vaginal or cervical lesions, and no adnexal masses or tenderness.   vaginal atrophy noted no pain on bimanual exam no bleeding or source of bleeding found Msk:  poor rom L knee with some crepitice- suspect OA  Pulses:  R and L carotid,radial,femoral,dorsalis pedis and posterior tibial pulses are full and equal bilaterally Extremities:  no cce today Neurologic:  sensation intact to light touch, gait normal, and DTRs symmetrical and normal.   Skin:  Intact without suspicious lesions or rashes nl color and turgor  no pallor or icterus  Cervical Nodes:  No lymphadenopathy noted Inguinal Nodes:  No significant adenopathy Psych:  normal affect, talkative and pleasant    Impression & Recommendations:  Problem # 1:  MICROSCOPIC HEMATURIA (ICD-599.72) Assessment New blood on pad and in urine with worse incontinence  rbcs and some wbcs on ua- tx empirically for uti with sulfa pend cx  also pap pend  Her updated medication list for this problem includes:    Septra Ds 800-160 Mg Tabs (Sulfamethoxazole-trimethoprim) .Marland Kitchen... 1 by mouth two times a day for 7 days for urinary infection  Orders: T-Culture, Urine ([id number]) Specimen Handling (24401) UA Dipstick W/ Micro (manual) (81000)  Problem # 2:  ROUTINE GYNECOLOGICAL EXAMINATION (ICD-V72.31) Assessment: Comment Only  exam done in light of blood on her pad pap done no source of bleeding found- but vag mucosa is very atrophic  suspect blood seen more likely from urine  Orders: Obtaining  Screening PAP Smear (U2725) Pelvic & Breast Exam ( Medicare)  (G0101)  Problem # 3:  ENCOUNTER FOR THERAPEUTIC DRUG MONITORING (ICD-V58.83) Assessment: Comment Only  INR today -- will be starting abx that may inc inr- pt and caregiver aware   Orders: Protime ([id number])  Problem # 4:  POLYMYALGIA RHEUMATICA, HX OF (ICD-725) Assessment: Improved this has been stable- and in past pt was put on remeron for this / pain/ wt loss/sleep feels she does not need it and is stopping it   Complete Medication List: 1)  Zocor 20 Mg Tabs (Simvastatin) .... Take one by mouth daily 2)  Meclizine Hcl 25 Mg Tabs (Meclizine hcl) .... Take by mouth as directed prn 3)  Daily Multi Tabs (Multiple vitamins-minerals) .... Take one tab two times a day 4)  Coumadin 5 Mg Tabs (Warfarin sodium) .... Take by mouth as directed 5)  Tylenol Extra Strength 500 Mg Tabs (Acetaminophen) .... Otc as directed. 6)  Septra Ds 800-160 Mg Tabs (Sulfamethoxazole-trimethoprim) .Marland Kitchen.. 1 by mouth two times a day for 7 days for urinary infection  Patient Instructions: 1)  go ahead mirtazapine -- if you feel worse and start loosing weight let me know  2)  take the septra DS for urinary infection 3)  if bleeding re- occurs please update me  4)  if fever or other symptoms update me 5)  keep drinking your water  6)  I will update you with a plan when your urine culture returns  Prescriptions: SEPTRA  DS 800-160 MG TABS (SULFAMETHOXAZOLE-TRIMETHOPRIM) 1 by mouth two times a day for 7 days for urinary infection  #14 x 0   Entered and Authorized by:   Judith Part MD   Signed by:   Judith Part MD on 08/29/2009   Method used:   [facility]        [facility] [id number]       [address]       [place], [place]  [address]       Ph: [phone] or [phone]       Fax: [phone]   RxID:   [id number]   Current Allergies (reviewed today): ACE INHIBITORS   Laboratory Results   Urine  Tests  Date/Time Received: August 29, 2009 1:06 PM  Date/Time Reported: August 29, 2009 1:06 PM   Routine Urinalysis   Color: yellow Appearance: Hazy Glucose: negative   (Normal Range: Negative) Bilirubin: negative   (Normal Range: Negative) Ketone: negative   (Normal Range: Negative) Spec. Gravity: 1.010   (Normal Range: 1.003-1.035) Blood: large   (Normal Range: Negative) pH: 6.0   (Normal Range: 5.0-8.0) Protein: 30   (Normal Range: Negative) Urobilinogen: 0.2   (Normal Range: 0-1) Nitrite: negative   (Normal Range: Negative) Leukocyte Esterace: small   (Normal Range: Negative)  Urine Microscopic WBC/HPF: 2-4 RBC/HPF: many Bacteria/HPF: moderate Mucous/HPF: few Epithelial/HPF: 1-3 Crystals/HPF: few Casts/LPF: 0 Yeast/HPF: 0 Other: 0     Blood Tests   Date/Time Received: August 29, 2009 2:02 PM Date/Time Reported: August 29, 2009 2:02 PM   INR: 2.4   (Normal Range: 0.88-1.12   Therap INR: 2.0-3.5)      ANTICOAGULATION RECORD PREVIOUS REGIMEN & LAB RESULTS Anticoagulation Diagnosis:  Deep venous thrombosis on  06/28/2009 Previous INR Goal Range:  2.5-3.5 on  06/28/2009 Previous INR:  3.5 on  08/02/2009 Previous Coumadin Dose(mg):  5/2.5/5/2.5 on  06/28/2009 Previous Regimen:  no change on  08/02/2009 Previous Coagulation Comments:  . on  05/13/2009  NEW REGIMEN & LAB RESULTS Current INR: 2.4 Current Coumadin Dose(mg): 5/2.5/2.5 Regimen: no change  (no change)  Provider: Suprina Mandeville Repeat testing in: 3 weeks  Anticoagulation Visit Questionnaire Coumadin dose missed/changed:  No Abnormal Bleeding Symptoms:  No  Any diet changes including alcohol intake, vegetables or greens since the last visit:  No Any illnesses or hospitalizations since the last visit:  No Any signs of clotting since the last visit (including chest discomfort, dizziness, shortness of breath, arm tingling, slurred speech, swelling or redness in leg):  No  MEDICATIONS ZOCOR 20 MG TABS  (SIMVASTATIN) Take one by mouth daily MECLIZINE HCL 25 MG TABS (MECLIZINE HCL) Take by mouth as directed prn DAILY MULTI  TABS (MULTIPLE VITAMINS-MINERALS) take one tab two times a day COUMADIN 5 MG TABS (WARFARIN SODIUM) take by mouth as directed TYLENOL EXTRA STRENGTH 500 MG TABS (ACETAMINOPHEN) OTC As directed. SEPTRA DS 800-160 MG TABS (SULFAMETHOXAZOLE-TRIMETHOPRIM) 1 by mouth two times a day for 7 days for urinary infection

## 2010-07-04 NOTE — Progress Notes (Signed)
Summary: ? drug interaction  Phone Note From Pharmacy   [facility]er: [facility] Summary of Call: Letter regarding possible drug interaction between sulfa and warfarin is on your shelf. Initial call taken by: Lowella Petties CMA,  August 31, 2009 8:54 AM  Follow-up for Phone Call        I am aware of the possible interaction I disc this with pt when I gave it to her - that this (and all ) abx can affect INR when on coumadin  for that reason- I recommend checking her INR early- fri or monday please to monitor - thanks  Follow-up by: Judith Part MD,  August 31, 2009 10:23 AM  Additional Follow-up for Phone Call Additional follow up Details #1::        Left message for patient to call back. Lewanda Rife LPN  August 31, 2009 10:50 AM   Advised pt's daughter, protime scheduled for friday. Additional Follow-up by: Lowella Petties CMA,  August 31, 2009 11:06 AM

## 2010-07-04 NOTE — Progress Notes (Signed)
Summary: regarding BP  Phone Note Call from Patient   Caller: DaughterMaren Morales [phone] Summary of Call: Pt saw kidney specialist this morning, BP was up so he put her on amlodipine 5 mg, one a day.  She is supposed to get her protime checked 4/18 but Dr. Thedore Mins said she should get it checked sooner than that.  Please advise. Initial call taken by: Lowella Petties CMA,  September 05, 2009 11:27 AM  Follow-up for Phone Call        thanks for the update sched PT/ INR later this week- thanks Follow-up by: Judith Part MD,  September 05, 2009 11:41 AM  Additional Follow-up for Phone Call Additional follow up Details #1::        Left message for patient to call back. Lewanda Rife LPN  September 05, 1912 2:59 PM   [name] called back and said the dr. wanted her protime checked next week. Pt scheduled for 09/13/09 at 9:45 am for PT and INR. Left 09/19/09 scheduled in case need to be retested.Lewanda Rife LPN  September 06, 7827 3:07 PM

## 2010-07-04 NOTE — Consult Note (Signed)
Summary: [facility]   Imported By: Lanelle Bal 11/25/2009 10:07:26  _____________________________________________________________________  External Attachment:    Type:   Image     Comment:   External Document

## 2010-07-04 NOTE — Assessment & Plan Note (Signed)
Summary: F/U,FLU SHOT/CLE   Vital Signs:  Patient profile:   77 year old female Height:      63.5 inches Weight:      128.25 pounds BMI:     22.44 Temp:     97.9 degrees F oral Pulse rate:   76 / minute Pulse rhythm:   regular BP sitting:   114 / 64  (left arm) Cuff size:   regular  Vitals Entered By: Lewanda Rife LPN (February 24, 2010 11:28 AM) CC: follow-up visit   History of Present Illness: here for f/u of chronic med problems and also wt loss   wt is down  8 lb family called and we re-started remeron in helps of inc appetite again this helped her sleep but not appetite  has gotten some ensure  ? unsure what she craves or would taste good    HTN in good control  renal insuff(with bladder outlet obst) is fairly stable and being watched by renal dribbles all the time  last cr 2.8 K got higher so went off vit  gets up frequently to urinate   family says her mood is low -- she agrees    flu shot today  lipids- on zocor  DVT last fall with cerebrovasc dz-on coumadin  plan to stop coumadin at 1 year mark in nov if no problems   constipation due to low intake  has to take regular laxative     Allergies: 1)  Ace Inhibitors  Past History:  Past Surgical History: Last updated: 04/12/2009 Cataract extraction Cholecystectomy (2000) Carotid doppler- bilateral plaque (09/2000) Echo- mild LVH EF 50% AS Arterial doppler-  Vert. basilar insuffiency (2002) Dexa- osteopenia (07/1999) Carotid doppler- non significant stenosis (01/2005) Zoster (10/2000) Colonoscopy- polyps (06/2002) Trigger fingercontracture (11/2002) 2D Echo- mild aortic sclerosis (01/2005) Carotid doppler 40% R, 40-60% L CT abd and pelvis--inflated urinary bladder and large amt stool throughout without blockage---05/25/08 R DVT 11/10  Family History: Last updated: 04/15/2009 Father: Heart problems, ? HTN Mother:  Siblings: sister- benign breast nodule brother - blood clot   Social  History: Last updated: 10/26/2008 Marital Status: widowed Children: 2 Occupation: retired non smoker no alcohol Regular Exercise - yes Drug Use - no  Risk Factors: Exercise: yes (10/26/2008)  Risk Factors: Smoking Status: never (10/11/2006)  Past Medical History: Dyspnea     --echo with normal EF. +diastolic dysfunction CAROTID STENOSIS (ICD-433.10)     --u/s 4/09, L 40-59% R 0-39%  stable for many years.  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 Eyes:  Denies blurring and eye irritation. CV:  Denies chest pain or discomfort, lightheadness, and palpitations. Resp:  Denies cough, shortness of breath, and wheezing. GI:  Denies abdominal pain, change in bowel habits, and indigestion. MS:  Denies joint pain, joint redness, joint swelling, muscle aches, and cramps; generally feels a bit weak. Derm:  Denies lesion(s), poor wound healing, and rash. Neuro:  Denies numbness and tingling. Psych:  Complains of depression; denies anxiety, panic attacks, sense of great danger, and suicidal thoughts/plans. Endo:  Denies cold intolerance, excessive thirst, excessive urination, and  heat intolerance. Heme:  Denies abnormal bruising and bleeding.  Physical Exam  General:  elderly and frail appearing- seems more down than usual Head:  normocephalic, atraumatic, and no  abnormalities observed.   Eyes:  vision grossly intact, pupils equal, pupils round, and pupils reactive to light.  no conj pallor  Mouth:  pharynx pink and moist.   Neck:  supple with full rom and no masses or thyromegally, no JVD or carotid bruit  Chest Wall:  No deformities, masses, or tenderness noted. Lungs:  Normal respiratory effort, chest expands symmetrically. Lungs are clear to auscultation, no crackles or wheezes. Heart:  RRR stable systolic M  no gallops Abdomen:  Bowel sounds positive,abdomen soft and non-tender without masses, organomegaly or hernias noted. no renal bruits  Msk:  No deformity or scoliosis noted of thoracic or lumbar spine.  some changes of OA in peripheral joints  Pulses:  R and L carotid,radial,femoral,dorsalis pedis and posterior tibial pulses are full and equal bilaterally Extremities:  trace pedal edema  Neurologic:  sensation intact to light touch.   Skin:  Intact without suspicious lesions or rashes Cervical Nodes:  No lymphadenopathy noted Inguinal Nodes:  No significant adenopathy Psych:  seems a bit depressed today does answer questions appropriately   Impression & Recommendations:  Problem # 1:  DVT (ICD-453.40) Assessment Improved  check INR- doing well can stop coumadin nov1 at the 1 year mark  Orders: Prescription Created Electronically [phone])  Problem # 2:  HYPERPOTASSEMIA (ICD-276.7) Assessment: Unchanged watching / also renal  renal panel Orders: Venipuncture (30865) TLB-Renal Function Panel (80069-RENAL) TLB-Lipid Panel (80061-LIPID) TLB-ALT (SGPT) (84460-ALT) TLB-AST (SGOT) (84450-SGOT)  Problem # 3:  HYPERTENSION (ICD-401.9) Assessment: Unchanged  good control on norvasc without change lab today Her updated medication list for this problem includes:    Amlodipine Besylate 5 Mg Tabs (Amlodipine besylate) .Marland Kitchen... Take one by mouth daily  Orders: Venipuncture (78469) TLB-Renal Function Panel (80069-RENAL) TLB-Lipid  Panel (80061-LIPID) TLB-ALT (SGPT) (84460-ALT) TLB-AST (SGOT) (84450-SGOT) Prescription Created Electronically [phone])  BP today: 114/64 Prior BP: 120/68 (11/22/2009)  Labs Reviewed: K+: 4.8 (08/08/2009) Creat: : 2.8 (08/08/2009)   Chol: 158 (01/11/2009)   HDL: 31.40 (01/11/2009)   LDL: 114 (01/11/2009)   TG: 64.0 (01/11/2009)  Problem # 4:  HYPERLIPIDEMIA (ICD-272.4) Assessment: Unchanged  lab today on low dose zocor and adv  Her updated medication list for this problem includes:    Zocor 20 Mg Tabs (Simvastatin) .Marland Kitchen... Take one by mouth daily  Orders: Venipuncture (84132) TLB-Renal Function Panel (80069-RENAL) TLB-Lipid Panel (80061-LIPID) TLB-ALT (SGPT) (84460-ALT) TLB-AST (SGOT) (84450-SGOT) Prescription Created Electronically [phone])  Labs Reviewed: SGOT: 31 (04/12/2009)   SGPT: 15 (04/12/2009)   HDL:31.40 (01/11/2009), 35.2 (02/27/2008)  LDL:114 (01/11/2009), 109 (02/27/2008)  Chol:158 (01/11/2009), 156 (02/27/2008)  Trig:64.0 (01/11/2009), 61 (02/27/2008)  Problem # 5:  WEIGHT LOSS (ICD-783.21) Assessment: Deteriorated  multifactorial with geriatric failure to thrive as well as depression/ renal insuff and mult meds inc remeron to 30  meal sched disc f/u 3 mo   Orders: Prescription Created Electronically [phone])  Problem # 6:  Preventive Health Care (ICD-V70.0) flu shot today  Complete Medication List: 1)  Zocor 20 Mg Tabs (Simvastatin) .... Take one by mouth daily 2)  Meclizine Hcl 25 Mg Tabs (Meclizine hcl) .... Take by mouth as directed prn 3)  Coumadin 5 Mg Tabs (Warfarin sodium) .... Take by mouth as directed 4)  Tylenol Extra Strength 500 Mg Tabs (Acetaminophen) .... Otc as directed. 5)  Walker With [employer]  .Marland KitchenMarland KitchenMarland Kitchen  For use with ambulation once daily  429.9, 715.90, 355.9 6)  Amlodipine Besylate 5 Mg Tabs (Amlodipine besylate) .... Take one by mouth daily 7)  Warfarin Sodium 1 Mg Tabs (Warfarin sodium) .... Take as directed by mouth 8)  Remeron 30 Mg Tabs  (Mirtazapine) .Marland Kitchen.. 1 by mouth at bedtime  Other Orders: Flu Vaccine 59yrs + MEDICARE PATIENTS (J1914) Administration Flu vaccine - MCR (N8295)  Patient Instructions: 1)  protime/ inr today 2)  you can stop coumadin on november 1 3)  labs today 4)  increase the remeron (mirtazapine) to 30 mg daily  5)  this should further help appetite 6)  put on regular eating schedule- even if small amounts 7)  miralax is fine  8)  follow up in 3 month  9)  handicapped sticker- [facility] to [facility] Prescriptions: ZOCOR 20 MG TABS (SIMVASTATIN) Take one by mouth daily  #30 x 11   Entered and Authorized by:   Judith Part MD   Signed by:   Judith Part MD on 02/24/2010   Method used:   [facility]        [facility]  [address] [facility]       [address]       [place], [place]  [address]       Ph: [phone] or [phone]       Fax: [phone]   RxID:   [id number] REMERON 30 MG TABS (MIRTAZAPINE) 1 by mouth at bedtime  #30 x 11   Entered and Authorized by:   Judith Part MD   Signed by:   Judith Part MD on 02/24/2010   Method used:   [facility]        [facility]  [address] [facility]ail[facility]       [address]       [place], [place]  [address]       Ph: [phone] or [phone]       Fax: [phone]   RxID:   [id number]   Current Allergies (reviewed today): ACE INHIBITORS     Flu Vaccine Consent Questions     Do you have a history of severe allergic reactions to this vaccine? no    Any prior history of allergic reactions to egg and/or gelatin? no    Do you have a sensitivity to the preservative Thimersol? no    Do you have a past history of Guillan-Barre Syndrome? no    Do you currently have an acute febrile illness? no    Have you ever had a severe reaction to latex? no    Vaccine information given and explained to patient? yes    Are you currently pregnant? no    Lot Number:[id number]   Exp Date:12/02/2010   Site Given  Left Deltoid  IMedflu  Lewanda Rife LPN  February 24, 2010 12:07 PM

## 2010-07-04 NOTE — Consult Note (Signed)
Summary: [facility]   Imported By: Sherian Rein 02/18/2010 09:40:45  _____________________________________________________________________  External Attachment:    Type:   Image     Comment:   External Document

## 2010-07-04 NOTE — Progress Notes (Signed)
Summary: needs order for a walker  Phone Note Call from Patient Call back at Home Phone [phone]   Caller: Daughter [name]  Summary of Call: Pt is asking for an order so that she can get a walker with wheels.  Please let daughter know when order is ready. Initial call taken by: Lowella Petties CMA,  Oct 03, 2009 4:51 PM  Follow-up for Phone Call        I will do px for that and put in nurse in box Follow-up by: Judith Part MD,  Oct 04, 2009 8:05 AM  Additional Follow-up for Phone Call Additional follow up Details #1::        [name] notified as instructed by telephone. Prescription left at front desk. Lewanda Rife LPN  Oct 05, 4538 12:05 PM     New/Updated Medications: * WALKER WITH WHEELS for use with ambulation once daily  429.9, 715.90, 355.9 Prescriptions: WALKER WITH WHEELS for use with ambulation once daily  429.9, 715.90, 355.9  #1 x 0   Entered and Authorized by:   Judith Part MD   Signed by:   Judith Part MD on 10/04/2009   Method used:   Print then Give to Patient   RxID:   [id number]

## 2010-07-04 NOTE — Progress Notes (Signed)
Summary: should pt take asa?  Phone Note Call from Patient Call back at [facility] Phone [phone]   Caller: Daughter  [name] Summary of Call: Pt's daughter is asking if you want pt to start taking a daily aspirin since she is no longer takes coumadin.  Also, should she start wearing support hose. Initial call taken by: Lowella Petties CMA, AAMA,  May 02, 2010 9:29 AM  Follow-up for Phone Call        I'm going to have her avoid the aspirin in light of her kidney function (asa is hard on the kidneys) good question however Follow-up by: Judith Part MD,  May 02, 2010 10:08 AM  Additional Follow-up for Phone Call Additional follow up Details #1::        Do you want pt to wear support stockings?Lewanda Rife LPN  May 02, 2010 10:09 AM   oh sorry...- yes I do if she can Additional Follow-up by: Judith Part MD,  May 02, 2010 10:20 AM    Additional Follow-up for Phone Call Additional follow up Details #2::    Left message for Minervat to call back. Lewanda Rife LPN  May 02, 2010 10:28 AM   [name] notified as instructed by telephone. Lewanda Rife LPN  May 02, 2010 10:54 AM

## 2010-07-05 ENCOUNTER — Ambulatory Visit: Payer: Self-pay | Admitting: Internal Medicine

## 2010-07-06 NOTE — Consult Note (Signed)
Summary: [facility]   Imported By: Lanelle Bal 06/13/2010 10:05:40  _____________________________________________________________________  External Attachment:    Type:   Image     Comment:   External Document

## 2010-07-06 NOTE — Assessment & Plan Note (Signed)
Summary: SIDE PAIN/CLE   Vital Signs:  Patient profile:   77 year old female Height:      63.5 inches Weight:      122 pounds BMI:     21.35 Temp:     97.9 degrees F oral Pulse rate:   84 / minute Pulse rhythm:   regular BP sitting:   118 / 64  (left arm) Cuff size:   regular  Vitals Entered By: Lewanda Rife LPN (June 06, 2010 3:37 PM) CC: Rt side pain , hurts worse when takes deep breath, on and off pain for 2 days. Now pain level is 0.   History of Present Illness: had some R side pain after she had a cold with cough  cough has been off and on for a while  ? how long the side pain was   cough was prod of clear sputum and is better now   got sore when she took a deep breath last night kept her up all night -- especially with breathing   nl appetite and BMs   today is totally normal   is still struggling with eating  perhaps not a whole lot  the remeron has not really helped   Allergies: 1)  Ace Inhibitors  Past History:  Past Medical History: Last updated: 02/24/2010 Dyspnea     --echo with normal EF. +diastolic dysfunction CAROTID STENOSIS (ICD-433.10)     --u/s 4/09, L 40-59% R 0-39%  stable for many years.  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  basilar insuffiency (2002) Dexa- osteopenia (07/1999) Carotid doppler- non significant stenosis (01/2005) Zoster (10/2000) Colonoscopy- polyps (06/2002) Trigger fingercontracture (11/2002) 2D Echo- mild aortic sclerosis (01/2005) Carotid doppler 40% R, 40-60% L CT abd and pelvis--inflated urinary bladder and large amt stool throughout without blockage---05/25/08 R DVT 11/10  Family History: Last updated: 04/15/2009 Father: Heart problems, ? HTN Mother:  Siblings: sister- benign breast nodule brother - blood clot   Social History: Last updated: 10/26/2008 Marital Status: widowed Children: 2 Occupation: retired non smoker no alcohol Regular Exercise - yes Drug Use - no  Risk Factors: Exercise: yes (10/26/2008)  Risk Factors: Smoking Status: never (10/11/2006)  Review of Systems General:  Complains of loss of appetite; denies chills, fatigue, and fever. Eyes:  Denies blurring and eye irritation. ENT:  Denies nasal congestion, postnasal drainage, and sore throat. CV:  Complains of chest pain or discomfort; denies lightheadness, palpitations, shortness of breath with exertion, and swelling of feet. Resp:  Complains of chest pain with inspiration, cough, and pleuritic; denies shortness of breath, sputum productive, and wheezing. GI:  Denies indigestion, nausea, and vomiting. GU:  Denies dysuria and urinary frequency. MS:  Complains of stiffness; denies cramps and muscle weakness. Derm:  Denies itching, lesion(s), poor wound healing, and rash. Neuro:  Denies numbness, tingling, and weakness. Endo:  Denies cold intolerance and heat intolerance. Heme:  Denies abnormal bruising and bleeding.  Physical Exam  General:  frail elderly appearing female- in no distress  Head:  normocephalic, atraumatic, and  no abnormalities observed.   Eyes:  vision grossly intact, pupils equal, pupils round, and pupils reactive to light.   Ears:  R ear normal and L ear normal.   Nose:  no nasal  discharge.   Mouth:  pharynx pink and moist.   Neck:  supple with full rom and no masses or thyromegally, no JVD or carotid bruit  Chest Wall:  No deformities, masses, or tenderness noted. no crepitice or skin change over chest wall Breasts:  No mass, nodules, thickening, tenderness, bulging, retraction, inflamation, nipple discharge or skin changes noted.   Lungs:  Normal respiratory effort, chest expands symmetrically. Lungs are clear to auscultation, no crackles or wheezes. Heart:  RRR stable systolic M  no gallops Abdomen:  Bowel sounds positive,abdomen soft and non-tender without masses, organomegaly or hernias noted. Msk:  no cs or TS tenderness Extremities:  trace pedal edema  Neurologic:  strength normal in all extremities, sensation intact to light touch, gait normal, and DTRs symmetrical and normal.   Skin:  Intact without suspicious lesions or rashes Cervical Nodes:  No lymphadenopathy noted Inguinal Nodes:  No significant adenopathy Psych:  normal affect, talkative and pleasant    Impression & Recommendations:  Problem # 1:  PAINFUL RESPIRATION (WUJ-811.91) Assessment New suspect rib pain/strain after cough at this moment is asymptomatic  will check x ray chest and ribs and update recommend sympt care- see pt instructions   Her updated medication list for this problem includes:    Ecotrin Low Strength 81 Mg Tbec (Aspirin) .Marland Kitchen... Take 1 tablet by mouth once a day  Orders: T-2 View CXR (71020TC) T-Ribs Unilateral 2 Views (71100TC)  Complete Medication List: 1)  Zocor 20 Mg Tabs (Simvastatin) .... Take one by mouth daily 2)  Meclizine Hcl 25 Mg Tabs (Meclizine hcl) .... Take by mouth as directed as needed 3)  Tylenol Extra Strength 500 Mg Tabs (Acetaminophen) .... Otc as directed. 4)  Walker With [employer]  .... For use with ambulation once daily  429.9, 715.90, 355.9 5)  Amlodipine Besylate 5 Mg Tabs (Amlodipine besylate) .... Take one by mouth daily 6)  Remeron 30 Mg  Tabs (Mirtazapine) .Marland Kitchen.. 1 by mouth at bedtime 7)  Stool Softener Unsure of Name  .... Otc as directed. 8)  Miralax Powd (Polyethylene glycol 3350) .... Otc as directed. 9)  Ecotrin Low Strength 81 Mg Tbec (Aspirin) .... Take 1 tablet by mouth once a day  Patient Instructions: 1)  doing x rays today- I will update you of results tomorrow 2)  please call if pain worsens 3)  you can try some gentle heat on your side and tylenol as needed    Orders Added: 1)  T-2 View CXR [71020TC] 2)  T-Ribs Unilateral 2 Views [71100TC] 3)  Est. Patient Level III [47829]    Current Allergies (reviewed today): ACE INHIBITORS

## 2010-07-06 NOTE — Letter (Signed)
Summary: [facility]  [facility]   Imported By: Maryln Gottron 06/27/2010 13:39:28  _____________________________________________________________________  External Attachment:    Type:   Image     Comment:   External Document

## 2010-07-06 NOTE — Letter (Signed)
Summary: [facility]   Imported By: Lester [place] 05/11/2010 09:07:46  _____________________________________________________________________  External Attachment:    Type:   Image     Comment:   External Document

## 2010-08-19 ENCOUNTER — Emergency Department: Payer: Self-pay | Admitting: Emergency Medicine

## 2010-08-28 ENCOUNTER — Telehealth: Payer: Self-pay | Admitting: *Deleted

## 2010-08-28 NOTE — Telephone Encounter (Signed)
Patient's daughter, Maren Reamer  notified as instructed by telephone. [name] will call back for appt if no improvement.

## 2010-08-28 NOTE — Telephone Encounter (Signed)
Something like ben gay or icy hot may be helpful but do not use this with a heating pad at the same time Can massage the area  If not improving -- needs follow up for eval / poss x rays depending on how it looks

## 2010-08-28 NOTE — Telephone Encounter (Signed)
Patient fell last week and hurt her back. Daughter says that she has been taking tylenol and using heating pad, but it isn't really helping. She is asking if there is something else she could try, such as a cream that they can apply. Please advise. Uses [facility].

## 2010-08-28 NOTE — Telephone Encounter (Signed)
Opened in error

## 2010-09-21 ENCOUNTER — Ambulatory Visit: Payer: Self-pay | Admitting: Internal Medicine

## 2010-10-03 ENCOUNTER — Ambulatory Visit: Payer: Self-pay | Admitting: Internal Medicine

## 2010-10-17 NOTE — Assessment & Plan Note (Signed)
[facility] OFFICE NOTE   Tracy Morales, Tracy Morales                      MRN:          [id number]  DATE:02/05/2008                            DOB:          08/03/1919    PRIMARY CARE PHYSICIAN:  Tracy A. Tower, MD.   INTERVAL HISTORY:  [name] is a very pleasant 77 year old woman with  a history of hypertension, carotid stenosis, and diastolic dysfunction  who returns today for follow-up.   She says overall she is doing quite well.  Her main complaint is  arthritis in her hands and legs.  She does note that she gets short of  breath with just to moderate activity, but she is able to do all her  things around the house that she needs to do.  She denies any worsening  of her dyspnea.  She has not had any orthopnea, no PND, no chest pain,  no lower extremity edema.   Her review of systems as per HPI and problems, otherwise negative.   CURRENT MEDICATIONS:  1. Zocor 20 a day.  2. Aspirin 81 a day.  3. HCTZ 25 a day.  4. Mirtazapine.   PHYSICAL EXAMINATION:  GENERAL:  She is an elderly woman in no acute  distress.  She ambulates in the clinic slowly with a cane.  VITAL SIGNS:  Blood pressure is 130/70, heart rate is 53 weight is 144  which is stable.  HEENT:  Normal.  NECK:  Supple.  There is no JVD.  Carotids are 2+ bilaterally without  bruits.  There is no lymphadenopathy or thyromegaly.  CARDIAC:  PMI is  nondisplaced.  She has regular with an S4, no murmur.  LUNGS:  Clear.  ABDOMEN:  Soft, nontender, nondistended.  No hepatosplenomegaly.  No  bruits.  No masses.  Good bowel sounds.  NEURO:  She is alert and oriented x3.  Cranial nerves II through XII are  intact.  Moves all 4 extremities without difficulty.  Affect is  pleasant.   Carotid ultrasound done on September 09, 2007, showed a 40-59% lesion on the  left internal carotid and 0-39% on the right.  This has been stable for  many years.   ASSESSMENT  AND PLAN:  1. Shortness of breath.  I think this is multifactorial.  She does      have significant diastolic dysfunction on her echocardiogram, but      no evidence of fluid overload.  Continue current therapy.  We will      repeat echocardiogram in the next few months to make sure this is      stable.  2. Hypertension.  Blood pressure is relatively well controlled as      followed by Dr. Milinda Morales.  3. Carotid artery stenosis.  This has been very stable.  We will      follow ultrasounds every 1-2 years.     Tracy Morales. Bensimhon, MD  Electronically Signed    DRB/MedQ  DD: 02/05/2008  DT: 02/06/2008  Job #: [id number]

## 2010-10-17 NOTE — Assessment & Plan Note (Signed)
[facility] OFFICE NOTE   Tracy Morales, Tracy Morales                      MRN:          [id number]  DATE:09/09/2007                            DOB:          [birth year]    PRIMARY CARE PHYSICIAN:  Tracy A. Tower, MD   INTERVAL HISTORY:  Tracy Morales is a delightful 77 year old woman with a  history of hypertension, carotid artery stenosis and diastolic  dysfunction.  She has not had any known coronary artery disease.  She  also has a history of chronic bradycardia.  She returns today for  routine follow-up.  She was previously followed by Dr. Diona Browner.   I was not seeing Tracy Morales for about a year and a half now.  She says  she has been doing quite well.  She continues to have fatigue and stable  exertional dyspnea.  She says she walks some with her cane, then has to  take a break and starts back up.  This is really unchanged.  Denies any  chest pain, no lower extremity edema.  No dizziness.  She has not had  any focal neurologic problems.   CURRENT MEDICATIONS:  1. Zocor 20 mg a day.  2. Cilostazol 100 mg b.i.d.  3. Ecotrin 81 mg a day.  4. Hydrochlorothiazide 25 mg a day.  5. Mirtazapine 15 mg a day.  6. Aspirin 81 mg a day coated.   PHYSICAL EXAMINATION:  GENERAL APPEARANCE:  She is an elderly woman in  no acute distress.  Ambulates around the clinic slowly with her cane.  VITAL SIGNS:  Blood pressure is 120/60, heart rate 60, her weight is 144  which is stable.  HEENT:  Normal.  She has some significant kyphosis.  NECK:  Supple.  No JVD, carotids are 2+ bilaterally without bruits.  There is no lymphadenopathy or thyromegaly.  CARDIAC:  PMI is nondisplaced.  Irregular rate and rhythm with no rubs  or gallops.  She does have a soft tricuspid regurgitation murmur at the  left sternal border.  LUNGS:  Clear.  ABDOMEN:  Soft, nontender, nondistended.  No hepatosplenomegaly.  No  bruits, no masses.  Good bowel  sounds.  NEUROLOGIC:  Alert and oriented x3.  Cranial nerves II-XII were intact.  Moves all four extremities difficulty.  Affect is very pleasant.   ASSESSMENT/PLAN:  1. Hypertension.  This is well-controlled.  2. Carotid artery stenosis.  She is due for her carotid ultrasound      today.  3. Exertional dyspnea.  This is stable.  She has had an echocardiogram      a couple years ago which showed normal left ventricular function      and just diastolic dysfunction.  She also had a Myoview which was      reportedly normal.  4. Hyperlipidemia.  This is followed Dr. Milinda Morales.  Given her carotid      disease, would suggest titration for Statin to keep her LDL under      70.  5. Cilostazol.  I am not sure why she is on this.  She  denies any real      history of claudication.  I think it is probably reasonable to go      ahead and stop this and see how she does.  6. Bradycardia.  This is stable.  She does have left anterior      fascicular block.  We will continue to follow.     Bevelyn Buckles. Bensimhon, MD  Electronically Signed    DRB/MedQ  DD: 09/09/2007  DT: 09/09/2007  Job #: [id number]   cc:   Tracy A. Milinda Antis, MD

## 2010-10-20 NOTE — Assessment & Plan Note (Signed)
[facility]                              CARDIOLOGY OFFICE NOTE   SHASTA, CHINN                      MRN:          [id number]  DATE:03/04/2006                            DOB:          1920/03/04    PRIMARY CARE PHYSICIAN:  Dr. Roxy Manns.   REASON FOR VISIT:  Cardiac followup.   HISTORY OF PRESENT ILLNESS:  Tracy Morales returns for her routine visit.  She  states that she has been doing fairly well.  She apparently did develop  cough after her change from Benicar to lisinopril, and has had this  discontinued.  Her electrocardiogram shows sinus rhythm with a leftward axis  and decreased R wave progression anteriorly.  She has not had any  significant chest pain and has had similar changes on prior tracings,  specifically 1 noted from July of 2006.  Recent lipids from May showed an  LDL cholesterol of 109.  She is scheduled for followup carotid Dopplers  later today.  She is not experiencing any orthopnea, PND, or lower extremity  edema.   ALLERGIES:  No known drug allergies.   PRESENT MEDICATIONS:  1. Zocor 20 mg p.o. q. day.  2. Cilostazol 100 mg p.o. b.i.d.  3. Ecotrin 81 mg p.o. q. day.  4. Hydrochlorothiazide 25 mg p.o. q. day.  5. Mirtazapine 15 mg p.o. nightly.   REVIEW OF SYSTEMS:  As described in the history of present illness.  Otherwise, negative.   EXAMINATION:  Blood pressure today is 133/74, heart rate is 58, weight is  134 pounds, which is down form 147 in February.  The patient is in no acute distress without active symptoms.  NECK:  Examination of the neck reveals no elevated jugular venous pressure  or loud bruits.  LUNGS:  Clear without labored breathing.  CARDIAC:  Regular rate and rhythm with a 2/6 systolic murmur at the base but  preserved S2 and no gallop.  EXTREMITIES:  No significant pitting edemas.   IMPRESSION/RECOMMENDATIONS:  1. Hypertension with a history of diastolic dysfunction.  This has been  relatively stable.  She is now off of Benicar and lisinopril, and      continues on hydrochlorothiazide.  If she needs additional blood      pressure control may need to consider Norvasc.  She was taken off of      Benicar originally per the patient request in hopes of finding a      generic substitute.  2. Known carotid artery disease with a 40-60% left internal carotid artery      stenosis.  Followup Dopplers are arranged for today.  We will inform      her by phone of the results.  3. Hyperlipidemia on Zocor.  LDL cholesterol was 109 in May.  Would      consider increasing Zocor of 40 mg in the evening.       Jonelle Sidle, MD     SGM/MedQ  DD:  03/04/2006  DT:  03/04/2006  Job #:  [id number]   cc:   Tracy A. Milinda Antis, MD

## 2010-10-20 NOTE — Assessment & Plan Note (Signed)
[facility]                            CARDIOLOGY OFFICE NOTE   CARNELLA, FRYMAN                      MRN:          [id number]  DATE:09/09/2006                            DOB:          1920/05/05    PRIMARY CARE PHYSICIAN:  Marne A. Tower, MD   REASON FOR VISIT:  Routine follow-up.   HISTORY OF PRESENT ILLNESS:  I saw Ms. Kemmerer back in October.  Her  history is detailed in my previous notes.  She is doing fairly well,  stating that she has had no significant exertional chest pain and no  major change in her baseline dyspnea on exertion, which she reports  being relatively mild at this point.  Her electrocardiogram is stable,  showing sinus bradycardia with a leftward axis and voltage criteria for  left ventricular hypertrophy with no marked change compared to a prior  tracing from October.  At that time, she had a carotid ultrasound, which  revealed a 0-39% right and coronary artery stenosis and 40%-59% left  coronary artery stenosis.  Otherwise, her medicines remain unchanged.   ALLERGIES:  No known drug allergies.   PRESENT MEDICATIONS:  1. Zocor 20 mg p.o. daily.  2. Cilostazol 100 mg p.o. b.i.d.  3. Ecotrin 81 mg p.o. daily.  4. Hydrochlorothiazide 25 mg p.o. daily.  5. Remeron 15 mg p.o. nightly.  6. She uses Meclizine and Zyrtec p.r.n.   REVIEW OF SYSTEMS:  As noted in the history of present illness.   PHYSICAL EXAMINATION:  Blood pressure 132/78, heart rate is 56, weight  is 141 pounds up from 134.  The patient is comfortable and in no acute distress.  NECK:  Reveals no elevated jugular pressure without bruits, no  thyromegaly was noted.  LUNGS:  Clear without labored breathing.  No rales.  CARDIAC:  Reveals a regular rate and rhythm, no loud murmur or gallop.  EXTREMITIES:  Show no pitting edema.   IMPRESSION/RECOMMENDATIONS:  1. History of hypertensive heart disease with diastolic dysfunction.      She will continue on  Hydrochlorothiazide at this point.  Would      consider possible addition of low-dose Norvasc as a next step if      needed.  She did not tolerate angiotension-converting enzyme      inhibitor before and was concerned about a angiotensin-receptor      blockers given cost.  I have encouraged her to follow up with Dr.      Milinda Antis, in the meanwhile we will plan to see her back in one years      time.  2. Carotid artery disease as detailed above.  We will plan a followup      carotid duplex scan in one year.     Jonelle Sidle, MD  Electronically Signed    SGM/MedQ  DD: 09/09/2006  DT: 09/09/2006  Job #: [id number]   cc:   Marne A. Milinda Antis, MD

## 2010-11-22 ENCOUNTER — Encounter: Payer: Self-pay | Admitting: Cardiovascular Disease

## 2010-11-28 ENCOUNTER — Encounter: Payer: Self-pay | Admitting: Internal Medicine

## 2010-11-28 ENCOUNTER — Encounter (INDEPENDENT_AMBULATORY_CARE_PROVIDER_SITE_OTHER): Payer: Medicare Other | Admitting: *Deleted

## 2010-11-28 ENCOUNTER — Other Ambulatory Visit: Payer: Self-pay | Admitting: Cardiology

## 2010-11-28 DIAGNOSIS — I6529 Occlusion and stenosis of unspecified carotid artery: Secondary | ICD-10-CM

## 2011-01-23 ENCOUNTER — Ambulatory Visit: Payer: Self-pay | Admitting: Internal Medicine

## 2011-02-03 ENCOUNTER — Ambulatory Visit: Payer: Self-pay | Admitting: Internal Medicine

## 2011-02-28 ENCOUNTER — Other Ambulatory Visit: Payer: Self-pay

## 2011-02-28 MED ORDER — SIMVASTATIN 20 MG PO TABS: 20.0000 mg | ORAL_TABLET | Freq: Every day | ORAL | Status: DC

## 2011-02-28 MED ORDER — MIRTAZAPINE 30 MG PO TABS: 30.0000 mg | ORAL_TABLET | Freq: Every day | ORAL | Status: DC

## 2011-02-28 NOTE — Telephone Encounter (Signed)
Will refill electronically  

## 2011-02-28 NOTE — Telephone Encounter (Signed)
[facility] faxed refill request Mirtazapine 30 mg. Last refilled 01/22/11.Please advise.

## 2011-03-01 ENCOUNTER — Other Ambulatory Visit: Payer: Self-pay | Admitting: *Deleted

## 2011-03-01 MED ORDER — MIRTAZAPINE 30 MG PO TABS: 30.0000 mg | ORAL_TABLET | Freq: Every day | ORAL | Status: DC

## 2011-03-01 MED ORDER — SIMVASTATIN 20 MG PO TABS: 20.0000 mg | ORAL_TABLET | Freq: Every day | ORAL | Status: DC

## 2011-03-01 NOTE — Telephone Encounter (Signed)
Sent electronically b/c pharmacy did not receive it  [employer]

## 2011-03-08 ENCOUNTER — Ambulatory Visit (INDEPENDENT_AMBULATORY_CARE_PROVIDER_SITE_OTHER): Payer: Medicare Other

## 2011-03-08 DIAGNOSIS — Z23 Encounter for immunization: Secondary | ICD-10-CM

## 2011-03-19 ENCOUNTER — Other Ambulatory Visit: Payer: Self-pay | Admitting: *Deleted

## 2011-03-19 MED ORDER — SIMVASTATIN 20 MG PO TABS: 20.0000 mg | ORAL_TABLET | Freq: Every day | ORAL | Status: DC

## 2011-03-19 NOTE — Telephone Encounter (Signed)
Schedule a general f/u when she can come  Will refill electronically

## 2011-03-19 NOTE — Telephone Encounter (Signed)
Last OV 06/06/2010 please advise.

## 2011-03-21 NOTE — Telephone Encounter (Signed)
Left vm for pt to callback 

## 2011-03-21 NOTE — Telephone Encounter (Signed)
Tracy Morales called back and left message with triage for me to call back. I called back at [phone] and left v/m for Tracy Morales to call me back.

## 2011-03-23 NOTE — Telephone Encounter (Signed)
Tracy Morales notified as instructed by telephone. Tracy Morales will call back to schedule appt.

## 2011-03-29 ENCOUNTER — Encounter: Payer: Self-pay | Admitting: Family Medicine

## 2011-03-30 ENCOUNTER — Ambulatory Visit (INDEPENDENT_AMBULATORY_CARE_PROVIDER_SITE_OTHER): Payer: Medicare Other | Admitting: Family Medicine

## 2011-03-30 ENCOUNTER — Encounter: Payer: Self-pay | Admitting: Family Medicine

## 2011-03-30 VITALS — BP 128/64 | HR 72 | Temp 98.0°F | Ht 63.5 in | Wt 137.2 lb

## 2011-03-30 DIAGNOSIS — I10 Essential (primary) hypertension: Secondary | ICD-10-CM

## 2011-03-30 DIAGNOSIS — N189 Chronic kidney disease, unspecified: Secondary | ICD-10-CM

## 2011-03-30 DIAGNOSIS — M79673 Pain in unspecified foot: Secondary | ICD-10-CM | POA: Insufficient documentation

## 2011-03-30 DIAGNOSIS — M79609 Pain in unspecified limb: Secondary | ICD-10-CM

## 2011-03-30 DIAGNOSIS — E785 Hyperlipidemia, unspecified: Secondary | ICD-10-CM

## 2011-03-30 LAB — LIPID PANEL
HDL: 37.3 mg/dL — ABNORMAL LOW (ref >39.00)
LDL Cholesterol: 143 mg/dL — ABNORMAL HIGH (ref 0–99)
VLDL: 12.8 mg/dL (ref 0.0–40.0)

## 2011-03-30 LAB — COMPREHENSIVE METABOLIC PANEL
ALT: 12 U/L (ref 0–35)
Alkaline Phosphatase: 103 U/L (ref 39–117)
CO2: 27 mEq/L (ref 19–32)
Creatinine, Ser: 2.1 mg/dL — ABNORMAL HIGH (ref 0.4–1.2)
GFR: 29.12 mL/min — ABNORMAL LOW (ref >60.00)
Sodium: 140 mEq/L (ref 135–145)
Total Bilirubin: 0.3 mg/dL (ref 0.3–1.2)
Total Protein: 7.5 g/dL (ref 6.0–8.3)

## 2011-03-30 NOTE — Patient Instructions (Signed)
I'm glad you are doing well and gaining weight  Keep eating a healthy diet No change in medicines Labs today  Look for heel protector/ boot or egg crate mattress cover at a medical supply company for night time heel pain

## 2011-03-30 NOTE — Assessment & Plan Note (Signed)
In very good control with norvasc- no problems or edema  No change Lab today

## 2011-03-30 NOTE — Assessment & Plan Note (Signed)
Check labs today  On low dose zocor without side eff Diet is balanced - focus on wt gain now Rev sat fats Lab today

## 2011-03-30 NOTE — Assessment & Plan Note (Signed)
Much better after dx of urinary retention and with self cathing  Lab today Continue renal f/u  Also appetite better - with imp in failure  To thrive

## 2011-03-30 NOTE — Assessment & Plan Note (Signed)
Pt is having pain in heels due to pressure from mattress when she lies on her back  No ulceration seen and no tenderness today  Adv checking out heel protector boots or egg crate cover for mattress and update me

## 2011-03-30 NOTE — Progress Notes (Signed)
Subjective:    Patient ID: Tracy Morales, female    DOB: August 31, 1919, 77 y.o.   MRN: [id number]  HPI Here for f/u of chronic medical problems  Is doing allright and feels good  Nothing new medically   Sometimes heels hurt in bed at night - not enough cushion on mattress    Wt is up 15 lb with bmi of 23 - which is great  appetitite is much better -- with the mirtazapine  Mood is good too  Sleeping well also   HTN stable 128/64- good control  No cp or ha or palpitatins  Due for labs for cholesterol on zocor Lab Results  Component Value Date   CHOL 156 02/24/2010   HDL 31.70* 02/24/2010   LDLCALC 105* 02/24/2010   TRIG 96.0 02/24/2010   CHOLHDL 5 02/24/2010   has been well controlled  Also due for check of multifactorial renal insuff    Chemistry      Component Value Date/Time   NA 137 02/24/2010 1152   K 4.7 02/24/2010 1152   CL 103 02/24/2010 1152   CO2 23 02/24/2010 1152   BUN 45* 02/24/2010 1152   CREATININE 2.9* 02/24/2010 1152      Component Value Date/Time   CALCIUM 8.7 02/24/2010 1152   ALKPHOS 63 04/12/2009 0000   AST 15 02/24/2010 1152   ALT 6 02/24/2010 1152   BILITOT 0.7 04/12/2009 0000     she does see specialist for this - and recently told that her kidney fxn was better and anemia better too  Doing self caths - she learned how to do that herself  Had issues with urinary retention  Had her flu shot   Patient Active Problem List  Diagnoses  . HYPERLIPIDEMIA  . HYPERPOTASSEMIA  . PERIPHERAL NEUROPATHY, LOWER EXTREMITIES, BILATERAL  . CONDUCTIVE HEARING LOSS BILATERAL  . HYPERTENSION  . BRADYCARDIA  . DIASTOLIC DYSFUNCTION  . CAROTID STENOSIS  . CEREBROVASCULAR DISEASE  . PERIPHERAL VASCULAR DISEASE  . DVT  . ALLERGIC RHINITIS  . RENAL INSUFFICIENCY, CHRONIC  . OVERACTIVE BLADDER  . MICROSCOPIC HEMATURIA  . OSTEOARTHRITIS  . POLYMYALGIA RHEUMATICA, HX OF  . EDEMA  . DYSPNEA  . INCONTINENCE, URGE  . CARDIAC MURMUR, HX OF  . Heel pain   Past  Medical History  Diagnosis Date  . Dyspnea     echo with normal EF. +diastolic dysfunction  . Occlusion and stenosis of carotid artery without mention of cerebral infarction 4/09    u/s L 40-59%, R 0-39%; stable for many years  . Peripheral vascular disease, unspecified   . Bradycardia   . HTN (hypertension)   . Hyperlipidemia   . RUQ pain   . Urge incontinence   . Chronic kidney disease, unspecified     chronic renal insufficiency  . Mononeuritis of lower limb, unspecified     peripheral neuropathy, bilateral  . Cerebrovascular disease, unspecified   . Osteoarthrosis, unspecified whether generalized or localized, unspecified site   . Dizziness and giddiness   . Allergic rhinitis, cause unspecified   . Polymyalgia rheumatica     hx  . Hypertonicity of bladder     hx  . DVT (deep venous thrombosis)     R  . Failure to thrive     gariatric  . Mild depression    Past Surgical History  Procedure Date  . Cataract extraction   . Cholecystectomy 2000  . Carotid doppler 4/02    bilateral plaque   .  Echo     mild LVH EF 50% As  . Arterial doppler   . Vert. basilar insufficiency 2002  . Dexa 2/01    osteopenia  . Carotid doppler 8/06    non significant stenosis  . Zoster 5/02  . Colonoscopy 1/04    polyps  . 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 2d echo 8/06    mild aortic stenosis.   . Carotid doppler     R 40%; L 40-60%  . Ct scan 05/25/08    abd and pelvis- inflated urinary bladder and large amt stool throughout w/o blockage    History  Substance Use Topics  . Smoking status: Never Smoker   . Smokeless tobacco: Not on file   Comment: non smoker   . Alcohol Use: No   Family History  Problem Relation Age of Onset  . Hypertension Father   . Other Father     Cardiac problems  . Heart disease Father   . Other Brother     blood clot  . Other Sister     benign breast nodule   Allergies  Allergen Reactions  . Ace Inhibitors     REACTION: cough   Current  Outpatient Prescriptions on File Prior to Visit  Medication Sig Dispense Refill  . amLODipine (NORVASC) 5 MG tablet Take 5 mg by mouth daily.        Marland Kitchen aspirin 81 MG EC tablet Take 81 mg by mouth daily.        . mirtazapine (REMERON) 30 MG tablet Take 1 tablet (30 mg total) by mouth at bedtime.  30 tablet  11  . NON FORMULARY Stool softener (unsure of name). OTC-UAD       . NON FORMULARY Walker with wheels - for use with ambulation once daily. 429.9, 715.90, 355.9       . polyethylene glycol powder (MIRALAX) powder Take 17 g by mouth daily. OTC  UAD       . simvastatin (ZOCOR) 20 MG tablet Take 1 tablet (20 mg total) by mouth daily.  30 tablet  5  . acetaminophen (TYLENOL) 500 MG tablet Take 500 mg by mouth every 6 (six) hours as needed. OTC-UAD       . meclizine (ANTIVERT) 32 MG tablet Take 32 mg by mouth as needed. UAD            Review of Systems Review of Systems  Constitutional: Negative for fever, appetite change, fatigue and unexpected weight change.  Eyes: Negative for pain and visual disturbance.  Respiratory: Negative for cough and shortness of breath.   Cardiovascular: Negative for cp or palpitations    Gastrointestinal: Negative for nausea, diarrhea and constipation.  Genitourinary: Negative for urgency and frequency.  Skin: Negative for pallor or rash   MSK - heel pain when lying in bed/ general arthritis, no swollen or red joints Neurological: Negative for weakness, light-headedness, numbness and headaches.  Hematological: Negative for adenopathy. Does not bruise/bleed easily.  Psychiatric/Behavioral: Negative for dysphoric mood. The patient is not nervous/anxious.          Objective:   Physical Exam  Constitutional: She appears well-developed and well-nourished. No distress.       Frail appearing elderly female  HENT:  Head: Normocephalic and atraumatic.  Mouth/Throat: Oropharynx is clear and moist.  Eyes: Conjunctivae and EOM are normal. Pupils are equal, round,  and reactive to light.  Neck: Normal range of motion. Neck supple. No JVD present. Carotid bruit is not present.  No thyromegaly present.  Cardiovascular: Normal rate, regular rhythm, normal heart sounds and intact distal pulses.  Exam reveals no gallop.   Pulmonary/Chest: Effort normal and breath sounds normal. No respiratory distress. She has no wheezes.  Abdominal: Soft. Bowel sounds are normal. She exhibits no distension, no abdominal bruit and no mass. There is no tenderness.  Musculoskeletal: She exhibits no edema and no tenderness.       Heels are not tender No skin change or ulceration seen  Lymphadenopathy:    She has no cervical adenopathy.  Neurological: She is alert. She has normal reflexes. No cranial nerve deficit. Coordination normal.  Skin: Skin is warm and dry. No rash noted. No erythema. No pallor.  Psychiatric: She has a normal mood and affect.          Assessment & Plan:

## 2011-06-21 ENCOUNTER — Other Ambulatory Visit: Payer: Self-pay | Admitting: *Deleted

## 2011-06-21 MED ORDER — SIMVASTATIN 20 MG PO TABS: 20.0000 mg | ORAL_TABLET | Freq: Every day | ORAL | Status: DC

## 2011-06-25 ENCOUNTER — Telehealth: Payer: Self-pay | Admitting: Family Medicine

## 2011-06-25 ENCOUNTER — Ambulatory Visit: Payer: Self-pay | Admitting: Family Medicine

## 2011-06-25 ENCOUNTER — Encounter: Payer: Self-pay | Admitting: Family Medicine

## 2011-06-25 ENCOUNTER — Ambulatory Visit (INDEPENDENT_AMBULATORY_CARE_PROVIDER_SITE_OTHER): Payer: Medicare Other | Admitting: Family Medicine

## 2011-06-25 VITALS — BP 126/76 | HR 76 | Temp 97.9°F | Wt 143.8 lb

## 2011-06-25 DIAGNOSIS — M79609 Pain in unspecified limb: Secondary | ICD-10-CM

## 2011-06-25 DIAGNOSIS — M79604 Pain in right leg: Secondary | ICD-10-CM

## 2011-06-25 DIAGNOSIS — I82409 Acute embolism and thrombosis of unspecified deep veins of unspecified lower extremity: Secondary | ICD-10-CM

## 2011-06-25 MED ORDER — ENOXAPARIN SODIUM 100 MG/ML ~~LOC~~ SOLN: 100.0000 mg | SUBCUTANEOUS | Status: DC

## 2011-06-25 MED ORDER — WARFARIN SODIUM 5 MG PO TABS: 5.0000 mg | ORAL_TABLET | Freq: Every day | ORAL | Status: DC

## 2011-06-25 NOTE — Telephone Encounter (Signed)
Thanks - please schedule her for f/u with me next week

## 2011-06-25 NOTE — Telephone Encounter (Signed)
Pt to see Dr Patsy Lager today 06/25/11 at 12:30pm.

## 2011-06-25 NOTE — Telephone Encounter (Signed)
Triage Record Num: [id number] Operator: Lesli Albee Patient Name: [facility] Call Date & Time: 06/25/2011 9:26:26AM Patient Phone: [phone] PCP: Patient Gender: Female PCP Fax : Patient DOB: Sep 21, 1919 Practice Name: [facility] Reason for Call: Caller: Maren Reamer Smith/Other; PCP: Roxy Manns A.; CB#: [phone]; ; ; Call regarding Daughter Was Told Saturday by Patient That Her Right Leg Swollen.; Daughter/[name] is calling to say that the pt has told her that her right leg is swollen. Pt tells the daughter does not know any of the details other than the fact that her MOm had a clot in that same leg 2 years ago. Rn unable to triage based on limited information. Advised daughter she could call back with the info and we could do a triage to get a more accurate view of when she needed to be seen. Daughter just requested appt. RN scheduled appt at 12:30 with Dr. Dallas Schimke . Dr. Milinda Antis is unavailable. Protocol(s) Used: Information Only Call; No Symptom Triage (Adult) Recommended Outcome per Protocol: Call Provider When Office is Open Reason for Outcome: Requesting regular office appointment Care Advice: ~ 06/25/2011 9:37:07AM Page 1 of 1 [email or web address]

## 2011-06-25 NOTE — Telephone Encounter (Signed)
Pt and pts daughter came by and was instructed on giving Lovenox injection. Copy of instructions including photos was given to pt and pts daughter; both pt and pts daughter said they had understanding of how to give injections and had no questions, also advised any questions to call back.Maren Reamer to call back to schedule appt with Dr Milinda Antis.

## 2011-06-25 NOTE — Progress Notes (Signed)
  Patient Name: Tracy Morales Date of Birth: 03/05/1920 Age: 78 y.o. Medical Record Number: [id number] Gender: female Date of Encounter: 06/25/2011  History of Present Illness:  Tracy Morales is a 78 y.o. very pleasant female patient who presents with the following:  R posterior lower leg pain - ? dvt Patient presents with R sided LE swelling, pain, firmness that has been present the last 5-7 days, but it has been decreasing in size and pain. No fever, chills, sweats, or trauma. Relatively inactive. No cut or break in skin  Had a prior DVT on the right 2 years ago.   Past Medical History, Surgical History, Social History, Family History, Problem List, Medications, and Allergies have been reviewed and updated if relevant.  Review of Systems: ROS: GEN: Acute illness details above GI: Tolerating PO intake GU: maintaining adequate hydration and urination Pulm: No SOB Interactive and getting along well at home.  Otherwise, ROS is as per the HPI.   Physical Examination: Filed Vitals:   06/25/11 1251  BP: 126/76  Pulse: 76  Temp: 97.9 F (36.6 C)  TempSrc: Oral  Weight: 143 lb 12 oz (65.205 kg)    There is no height on file to calculate BMI.   GEN: WDWN, NAD, Non-toxic, Alert & Oriented x 3 HEENT: Atraumatic, Normocephalic.  Ears and Nose: No external deformity. EXTR: No clubbing/cyanosis/edema. R posterior leg - more medially TTP, mild firmness, + with squeeze - pain NEURO: Normal gait.  PSYCH: Normally interactive. Conversant. Not depressed or anxious appearing.  Calm demeanor.    Assessment and Plan:  1. Right leg pain  US Venous Img Lower Unilateral Left  2. DVT (deep venous thrombosis)       At the time of this note, u/s has returned + for DVT on the right.  We will place the patient on Lovenox and start coumadin with close follow-up.  Wt = 65 kg Will use daily Lovenox dosing 100 mg daily Start Coumadin 5 mg daily, recheck on Thursday  All communicated  with patient / family  F/u next week with PCP

## 2011-06-25 NOTE — Telephone Encounter (Signed)
New onset DVT, placing on Lovenox and Coumadin  Heather or Rena - I asked them to come by today or tomorrow AM, ask for you to show them how to give the Subcutaneous injection for lovenox. And help schedule a follow-up with Dr. Milinda Antis next week  Terri - I am putting her on 5 mg of Coumadin for now - this will need to be adjusted, can you schedule an INR for thursday

## 2011-06-25 NOTE — Patient Instructions (Signed)
REFERRAL: GO THE THE FRONT ROOM AT THE ENTRANCE OF OUR CLINIC, NEAR CHECK IN. ASK FOR [name]. SHE WILL HELP YOU SET UP YOUR REFERRAL. DATE: TIME:  

## 2011-06-25 NOTE — Telephone Encounter (Signed)
Pt was seen

## 2011-06-26 ENCOUNTER — Encounter: Payer: Self-pay | Admitting: Family Medicine

## 2011-06-26 NOTE — Telephone Encounter (Signed)
Spoke with [name]. Has given the Lovenox injection with no problem and [name] has already scheduled f/u appt with Dr Milinda Antis.

## 2011-06-28 ENCOUNTER — Ambulatory Visit (INDEPENDENT_AMBULATORY_CARE_PROVIDER_SITE_OTHER): Payer: Medicare Other | Admitting: Family Medicine

## 2011-06-28 DIAGNOSIS — I82409 Acute embolism and thrombosis of unspecified deep veins of unspecified lower extremity: Secondary | ICD-10-CM

## 2011-06-28 DIAGNOSIS — Z5181 Encounter for therapeutic drug level monitoring: Secondary | ICD-10-CM

## 2011-06-28 DIAGNOSIS — Z7901 Long term (current) use of anticoagulants: Secondary | ICD-10-CM

## 2011-06-28 LAB — POCT INR: INR: 1.3

## 2011-06-28 NOTE — Patient Instructions (Signed)
Continue 5 mg daily and Lovenox injections. Will recheck on Monday

## 2011-07-02 ENCOUNTER — Ambulatory Visit: Payer: Medicare Other

## 2011-07-03 ENCOUNTER — Ambulatory Visit (INDEPENDENT_AMBULATORY_CARE_PROVIDER_SITE_OTHER): Payer: Medicare Other | Admitting: Family Medicine

## 2011-07-03 ENCOUNTER — Encounter: Payer: Self-pay | Admitting: Family Medicine

## 2011-07-03 VITALS — BP 130/74 | HR 68 | Temp 98.0°F | Ht 63.5 in | Wt 143.8 lb

## 2011-07-03 DIAGNOSIS — Z5181 Encounter for therapeutic drug level monitoring: Secondary | ICD-10-CM

## 2011-07-03 DIAGNOSIS — I82409 Acute embolism and thrombosis of unspecified deep veins of unspecified lower extremity: Secondary | ICD-10-CM

## 2011-07-03 DIAGNOSIS — Z7901 Long term (current) use of anticoagulants: Secondary | ICD-10-CM

## 2011-07-03 MED ORDER — WARFARIN SODIUM 5 MG PO TABS: ORAL_TABLET | ORAL | Status: DC

## 2011-07-03 NOTE — Assessment & Plan Note (Signed)
2nd episode of DVT - with family hx Clotting disorder is possible Doing very well transitioning from lovenox to coumadin today with INR 3.1 (goal 2.5-3.5) Symptoms much improved and reassuring exam R leg Disc coumadin dosing - dec to 2.5, 5,5,2.5,5,5 Voiced understanding Elderly but mentally sharp and steady with walker Has help and supervision from family

## 2011-07-03 NOTE — Progress Notes (Signed)
Subjective:    Patient ID: Tracy Morales, female    DOB: [birth year], 78 y.o.   MRN: [id number]  HPI Here for f/u of DVT- seen by Dr Patsy Lager- started on lovenox and coumadin Also had DVT 2 years ago Also had brother with hx of blood clot Had been sitting - sewing - working on [employer] are stable   On R leg-knot was hard at the onset -then went down some and was sore  Today-feels ok and down in size  Still taking lovenox   - INR was 3.1 today- great  Is taking 5 mg daily  Is quite steady with her walker No falls No cp  No sob   Patient Active Problem List  Diagnoses  . HYPERLIPIDEMIA  . HYPERPOTASSEMIA  . PERIPHERAL NEUROPATHY, LOWER EXTREMITIES, BILATERAL  . CONDUCTIVE HEARING LOSS BILATERAL  . HYPERTENSION  . BRADYCARDIA  . DIASTOLIC DYSFUNCTION  . CAROTID STENOSIS  . CEREBROVASCULAR DISEASE  . PERIPHERAL VASCULAR DISEASE  . DVT  . ALLERGIC RHINITIS  . RENAL INSUFFICIENCY, CHRONIC  . OVERACTIVE BLADDER  . MICROSCOPIC HEMATURIA  . OSTEOARTHRITIS  . POLYMYALGIA RHEUMATICA, HX OF  . EDEMA  . DYSPNEA  . INCONTINENCE, URGE  . CARDIAC MURMUR, HX OF  . Heel pain   Past Medical History  Diagnosis Date  . Dyspnea     echo with normal EF. +diastolic dysfunction  . Occlusion and stenosis of carotid artery without mention of cerebral infarction 4/09    u/s L 40-59%, R 0-39%; stable for many years  . Peripheral vascular disease, unspecified   . Bradycardia   . HTN (hypertension)   . Hyperlipidemia   . RUQ pain   . Urge incontinence   . Chronic kidney disease, unspecified     chronic renal insufficiency  . Mononeuritis of lower limb, unspecified     peripheral neuropathy, bilateral  . Cerebrovascular disease, unspecified   . Osteoarthrosis, unspecified whether generalized or localized, unspecified site   . Dizziness and giddiness   . Allergic rhinitis, cause unspecified   . Polymyalgia rheumatica     hx  . Hypertonicity of bladder     hx  . DVT (deep  venous thrombosis)     R  . Failure to thrive     gariatric  . Mild depression    Past Surgical History  Procedure Date  . Cataract extraction   . Cholecystectomy 2000  . Carotid doppler 4/02    bilateral plaque   . Echo     mild LVH EF 50% As  . Arterial doppler   . Vert. basilar insufficiency 2002  . Dexa 2/01    osteopenia  . Carotid doppler 8/06    non significant stenosis  . Zoster 5/02  . Colonoscopy 1/04    polyps  . 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 2d echo 8/06    mild aortic stenosis.   . Carotid doppler     R 40%; L 40-60%  . Ct scan 05/25/08    abd and pelvis- inflated urinary bladder and large amt stool throughout w/o blockage    History  Substance Use Topics  . Smoking status: Never Smoker   . Smokeless tobacco: Never Used   Comment: non smoker   . Alcohol Use: No   Family History  Problem Relation Age of Onset  . Hypertension Father   . Other Father     Cardiac problems  . Heart disease Father   . Other Brother  blood clot  . Other Sister     benign breast nodule   Allergies  Allergen Reactions  . Ace Inhibitors     REACTION: cough   Current Outpatient Prescriptions on File Prior to Visit  Medication Sig Dispense Refill  . acetaminophen (TYLENOL) 500 MG tablet Take 500 mg by mouth every 6 (six) hours as needed. OTC-UAD       . amLODipine (NORVASC) 5 MG tablet Take 5 mg by mouth daily.        Marland Kitchen aspirin 81 MG EC tablet Take 81 mg by mouth daily.        Marland Kitchen enoxaparin (LOVENOX) 100 MG/ML SOLN Inject 1 mL (100 mg total) into the skin daily.  10 mL  0  . ferrous sulfate 325 (65 FE) MG tablet Take 325 mg by mouth 2 (two) times daily with a meal.        . meclizine (ANTIVERT) 32 MG tablet Take 32 mg by mouth as needed. UAD       . mirtazapine (REMERON) 30 MG tablet Take 1 tablet (30 mg total) by mouth at bedtime.  30 tablet  11  . NON FORMULARY Stool softener (unsure of name). OTC-UAD       . NON FORMULARY Walker with wheels - for use with  ambulation once daily. 429.9, 715.90, 355.9       . polyethylene glycol powder (MIRALAX) powder Take 17 g by mouth daily. OTC  UAD       . simvastatin (ZOCOR) 20 MG tablet Take 1 tablet (20 mg total) by mouth daily.  30 tablet  5          Review of Systems Review of Systems  Constitutional: Negative for fever, appetite change, fatigue and unexpected weight change.  Eyes: Negative for pain and visual disturbance.  Respiratory: Negative for cough and shortness of breath.   Cardiovascular: Negative for cp or palpitations    Gastrointestinal: Negative for nausea, diarrhea and constipation.  Genitourinary: Negative for urgency and frequency.  Skin: Negative for pallor or rash   Neurological: Negative for weakness, light-headedness, numbness and headaches.  Hematological: Negative for adenopathy. Does not bruise/bleed easily. (but knows she will with the blood thinner) Psychiatric/Behavioral: Negative for dysphoric mood. The patient is not nervous/anxious.          Objective:   Physical Exam  Constitutional: She appears well-developed and well-nourished.  HENT:  Head: Normocephalic and atraumatic.  Mouth/Throat: Oropharynx is clear and moist.  Eyes: Conjunctivae and EOM are normal. Pupils are equal, round, and reactive to light. No scleral icterus.  Neck: Normal range of motion. Neck supple. No JVD present. Carotid bruit is not present.  Cardiovascular: Normal rate, regular rhythm and intact distal pulses.  Exam reveals no gallop.   Murmur heard. Pulmonary/Chest: Effort normal and breath sounds normal. No respiratory distress. She has no wheezes. She has no rales. She exhibits no tenderness.  Musculoskeletal: She exhibits edema. She exhibits no tenderness.       R leg- area of mild swelling and scant warmth over medial calf- non tender Some varicosities noted as well  Lymphadenopathy:    She has no cervical adenopathy.  Neurological: She is alert. She has normal reflexes. She  exhibits normal muscle tone.  Skin: Skin is warm and dry. No rash noted. No erythema. No pallor.  Psychiatric: She has a normal mood and affect.       Pt is mentally sharp  Assessment & Plan:

## 2011-07-03 NOTE — Patient Instructions (Signed)
I'm glad you are feeling good  Elevate leg when you can and let me know if pain or swelling get worse  Take the coumadin as follows  2.5 , 5, 5, 2.5, 5, 5   (can split the pill in 1/2 for 2.5)  That means wed 1/2 pill, Thursday whole, Friday whole, sat 1/2 , sun whole, mon whole  Stop the lovenox - since your INR is theraputic  Schedule protime/ INR check in 1 week Follow up with me in about 3 months Green vegetables can counteract the coumadin- so make sure to eat about the same amount every day

## 2011-07-10 ENCOUNTER — Ambulatory Visit (INDEPENDENT_AMBULATORY_CARE_PROVIDER_SITE_OTHER): Payer: Medicare Other | Admitting: Family Medicine

## 2011-07-10 DIAGNOSIS — M79604 Pain in right leg: Secondary | ICD-10-CM

## 2011-07-10 DIAGNOSIS — Z7901 Long term (current) use of anticoagulants: Secondary | ICD-10-CM

## 2011-07-10 DIAGNOSIS — I82409 Acute embolism and thrombosis of unspecified deep veins of unspecified lower extremity: Secondary | ICD-10-CM

## 2011-07-10 DIAGNOSIS — Z5181 Encounter for therapeutic drug level monitoring: Secondary | ICD-10-CM

## 2011-07-10 DIAGNOSIS — M79609 Pain in unspecified limb: Secondary | ICD-10-CM

## 2011-07-10 NOTE — Patient Instructions (Signed)
Hold x 2 days check Friday ( already took Coumadin today)

## 2011-07-13 ENCOUNTER — Ambulatory Visit (INDEPENDENT_AMBULATORY_CARE_PROVIDER_SITE_OTHER): Payer: Medicare Other | Admitting: Family Medicine

## 2011-07-13 ENCOUNTER — Other Ambulatory Visit: Payer: Self-pay | Admitting: *Deleted

## 2011-07-13 DIAGNOSIS — Z5181 Encounter for therapeutic drug level monitoring: Secondary | ICD-10-CM

## 2011-07-13 DIAGNOSIS — I82409 Acute embolism and thrombosis of unspecified deep veins of unspecified lower extremity: Secondary | ICD-10-CM

## 2011-07-13 DIAGNOSIS — Z7901 Long term (current) use of anticoagulants: Secondary | ICD-10-CM

## 2011-07-13 MED ORDER — WARFARIN SODIUM 1 MG PO TABS: 1.0000 mg | ORAL_TABLET | ORAL | Status: DC

## 2011-07-13 NOTE — Patient Instructions (Addendum)
2.5 mg daily except 1 mg T/TH check in 1 week New Rx for 1 mg called in Daughter Menerva, notified of changes

## 2011-07-19 ENCOUNTER — Ambulatory Visit (INDEPENDENT_AMBULATORY_CARE_PROVIDER_SITE_OTHER): Payer: Medicare Other | Admitting: Family Medicine

## 2011-07-19 DIAGNOSIS — I82409 Acute embolism and thrombosis of unspecified deep veins of unspecified lower extremity: Secondary | ICD-10-CM

## 2011-07-19 DIAGNOSIS — Z5181 Encounter for therapeutic drug level monitoring: Secondary | ICD-10-CM

## 2011-07-19 DIAGNOSIS — Z7901 Long term (current) use of anticoagulants: Secondary | ICD-10-CM

## 2011-07-19 LAB — POCT INR: INR: 1.8

## 2011-07-19 NOTE — Patient Instructions (Signed)
Increase to 2.5 mg then recheck 1 week

## 2011-07-20 ENCOUNTER — Ambulatory Visit: Payer: Medicare Other

## 2011-07-24 ENCOUNTER — Ambulatory Visit: Payer: Self-pay | Admitting: Internal Medicine

## 2011-07-24 LAB — CBC CANCER CENTER
Basophil %: 0.8 %
HCT: 36.5 % (ref 35.0–47.0)
HGB: 12.2 g/dL (ref 12.0–16.0)
Lymphocyte #: 1.6 x10 3/mm (ref 1.0–3.6)
Lymphocyte %: 40.4 %
MCH: 29.5 pg (ref 26.0–34.0)
MCHC: 33.5 g/dL (ref 32.0–36.0)
MCV: 88 fL (ref 80–100)
Monocyte %: 13.9 %
Neutrophil #: 1.6 x10 3/mm (ref 1.4–6.5)
RBC: 4.15 10*6/uL (ref 3.80–5.20)

## 2011-07-27 ENCOUNTER — Ambulatory Visit (INDEPENDENT_AMBULATORY_CARE_PROVIDER_SITE_OTHER): Payer: Medicare Other | Admitting: Family Medicine

## 2011-07-27 DIAGNOSIS — Z5181 Encounter for therapeutic drug level monitoring: Secondary | ICD-10-CM

## 2011-07-27 DIAGNOSIS — Z7901 Long term (current) use of anticoagulants: Secondary | ICD-10-CM

## 2011-07-27 DIAGNOSIS — I82409 Acute embolism and thrombosis of unspecified deep veins of unspecified lower extremity: Secondary | ICD-10-CM

## 2011-07-27 LAB — POCT INR: INR: 2.1

## 2011-07-27 NOTE — Patient Instructions (Signed)
Continue current dose, check in 2 weeks  

## 2011-08-03 ENCOUNTER — Ambulatory Visit: Payer: Self-pay | Admitting: Internal Medicine

## 2011-08-10 ENCOUNTER — Ambulatory Visit (INDEPENDENT_AMBULATORY_CARE_PROVIDER_SITE_OTHER): Payer: Medicare Other | Admitting: Internal Medicine

## 2011-08-10 DIAGNOSIS — I82409 Acute embolism and thrombosis of unspecified deep veins of unspecified lower extremity: Secondary | ICD-10-CM

## 2011-08-10 DIAGNOSIS — Z5181 Encounter for therapeutic drug level monitoring: Secondary | ICD-10-CM

## 2011-08-10 DIAGNOSIS — Z7901 Long term (current) use of anticoagulants: Secondary | ICD-10-CM

## 2011-08-10 LAB — POCT INR: INR: 1.5

## 2011-08-10 NOTE — Patient Instructions (Signed)
Take 5mg  today, then 2.5 mg then recheck 1 week

## 2011-08-11 ENCOUNTER — Other Ambulatory Visit: Payer: Self-pay | Admitting: Family Medicine

## 2011-08-17 ENCOUNTER — Ambulatory Visit (INDEPENDENT_AMBULATORY_CARE_PROVIDER_SITE_OTHER): Payer: Medicare Other | Admitting: Family Medicine

## 2011-08-17 DIAGNOSIS — Z7901 Long term (current) use of anticoagulants: Secondary | ICD-10-CM

## 2011-08-17 DIAGNOSIS — Z5181 Encounter for therapeutic drug level monitoring: Secondary | ICD-10-CM

## 2011-08-17 DIAGNOSIS — I82409 Acute embolism and thrombosis of unspecified deep veins of unspecified lower extremity: Secondary | ICD-10-CM

## 2011-08-17 LAB — POCT INR
INR: 1.7
INR: 1.7
INR: 1.7

## 2011-08-17 NOTE — Patient Instructions (Signed)
Take 2.5 mg except 5 mg Tues, Fri then recheck 1 week

## 2011-08-24 ENCOUNTER — Ambulatory Visit (INDEPENDENT_AMBULATORY_CARE_PROVIDER_SITE_OTHER): Payer: Medicare Other | Admitting: Internal Medicine

## 2011-08-24 DIAGNOSIS — I82409 Acute embolism and thrombosis of unspecified deep veins of unspecified lower extremity: Secondary | ICD-10-CM

## 2011-08-24 DIAGNOSIS — Z7901 Long term (current) use of anticoagulants: Secondary | ICD-10-CM

## 2011-08-24 DIAGNOSIS — Z5181 Encounter for therapeutic drug level monitoring: Secondary | ICD-10-CM

## 2011-08-24 LAB — POCT INR: INR: 1.9

## 2011-08-24 NOTE — Patient Instructions (Signed)
Take 2.5 mg except 5 mg Tues, Fri, sat   then recheck 1 week ( increased by 2.5 mg weekly)

## 2011-09-03 ENCOUNTER — Ambulatory Visit (INDEPENDENT_AMBULATORY_CARE_PROVIDER_SITE_OTHER): Payer: Medicare Other | Admitting: Family Medicine

## 2011-09-03 DIAGNOSIS — I82409 Acute embolism and thrombosis of unspecified deep veins of unspecified lower extremity: Secondary | ICD-10-CM

## 2011-09-03 DIAGNOSIS — Z7901 Long term (current) use of anticoagulants: Secondary | ICD-10-CM

## 2011-09-03 DIAGNOSIS — Z5181 Encounter for therapeutic drug level monitoring: Secondary | ICD-10-CM

## 2011-09-03 LAB — POCT INR: INR: 3.3

## 2011-09-03 NOTE — Patient Instructions (Signed)
Decrease back to 2.5 mg except 5 mg Tues, Fri,   then recheck 2 weeks

## 2011-09-17 ENCOUNTER — Ambulatory Visit (INDEPENDENT_AMBULATORY_CARE_PROVIDER_SITE_OTHER): Payer: Medicare Other | Admitting: Family Medicine

## 2011-09-17 DIAGNOSIS — Z7901 Long term (current) use of anticoagulants: Secondary | ICD-10-CM

## 2011-09-17 DIAGNOSIS — I82409 Acute embolism and thrombosis of unspecified deep veins of unspecified lower extremity: Secondary | ICD-10-CM

## 2011-09-17 DIAGNOSIS — Z5181 Encounter for therapeutic drug level monitoring: Secondary | ICD-10-CM

## 2011-09-17 NOTE — Patient Instructions (Signed)
2.5 mg except 5 mg Tues, Fri,   then recheck 2 weeks

## 2011-10-01 ENCOUNTER — Ambulatory Visit: Payer: Medicare Other | Admitting: Family Medicine

## 2011-10-01 ENCOUNTER — Ambulatory Visit: Payer: Medicare Other

## 2011-10-05 ENCOUNTER — Ambulatory Visit: Payer: Medicare Other

## 2011-10-05 ENCOUNTER — Ambulatory Visit: Payer: Medicare Other | Admitting: Family Medicine

## 2011-10-08 ENCOUNTER — Ambulatory Visit: Payer: Medicare Other

## 2011-10-09 ENCOUNTER — Ambulatory Visit (INDEPENDENT_AMBULATORY_CARE_PROVIDER_SITE_OTHER): Payer: Medicare Other | Admitting: Family Medicine

## 2011-10-09 ENCOUNTER — Encounter: Payer: Self-pay | Admitting: Family Medicine

## 2011-10-09 VITALS — BP 132/62 | HR 68 | Temp 98.8°F | Ht 63.5 in | Wt 146.8 lb

## 2011-10-09 DIAGNOSIS — Z5181 Encounter for therapeutic drug level monitoring: Secondary | ICD-10-CM

## 2011-10-09 DIAGNOSIS — I10 Essential (primary) hypertension: Secondary | ICD-10-CM

## 2011-10-09 DIAGNOSIS — E785 Hyperlipidemia, unspecified: Secondary | ICD-10-CM

## 2011-10-09 DIAGNOSIS — I82409 Acute embolism and thrombosis of unspecified deep veins of unspecified lower extremity: Secondary | ICD-10-CM

## 2011-10-09 DIAGNOSIS — N189 Chronic kidney disease, unspecified: Secondary | ICD-10-CM

## 2011-10-09 DIAGNOSIS — Z7901 Long term (current) use of anticoagulants: Secondary | ICD-10-CM

## 2011-10-09 LAB — LIPID PANEL
Cholesterol: 143 mg/dL (ref 0–200)
HDL: 33.8 mg/dL — ABNORMAL LOW (ref >39.00)
LDL Cholesterol: 88 mg/dL (ref 0–99)
VLDL: 21 mg/dL (ref 0.0–40.0)

## 2011-10-09 LAB — COMPREHENSIVE METABOLIC PANEL
AST: 30 U/L (ref 0–37)
Alkaline Phosphatase: 89 U/L (ref 39–117)
Glucose, Bld: 89 mg/dL (ref 70–99)
Sodium: 140 mEq/L (ref 135–145)
Total Bilirubin: 0.2 mg/dL — ABNORMAL LOW (ref 0.3–1.2)
Total Protein: 7.6 g/dL (ref 6.0–8.3)

## 2011-10-09 LAB — TSH: TSH: 1.15 u[IU]/mL (ref 0.35–5.50)

## 2011-10-09 LAB — POCT INR: INR: 3

## 2011-10-09 NOTE — Patient Instructions (Addendum)
If you are interested in a shingles/zoster vaccine - call your insurance to check on coverage,( you should not get it within 1 month of other vaccines) , then call us for a prescription  for it to take to a pharmacy that gives the shot    Labs today  protime today  Keep eating a healthy diet an try to stay active Try 4 oz of tonic water at bedtime for cramps (to prevent them)- also stretch your legs out  Follow up in about 6 months

## 2011-10-09 NOTE — Patient Instructions (Signed)
Continue 2.5 mg daily except 5 mg Tues, Fri,   then recheck 4 weeks 

## 2011-10-09 NOTE — Progress Notes (Signed)
Subjective:    Patient ID: Tracy Morales, female    DOB: 24-Jan-1920, 78 y.o.   MRN: [id number]  HPI Here for f/u of chronic conditions Has been feeling "allright"   Long term anticoag for recurrent DVT Last INR 3.4  Wt is up 3 lb  Hx of geriatric failure to thrive  bmi is 25 now On mirtazapine Appetite is good , and mood is ok as well - getting better   bp is  132/62   Today BP Readings from Last 3 Encounters:  10/09/11 132/62  07/03/11 130/74  06/25/11 126/76   no swellig in legs    Chemistry      Component Value Date/Time   NA 140 03/30/2011 1120   K 4.6 03/30/2011 1120   CL 108 03/30/2011 1120   CO2 27 03/30/2011 1120   BUN 40* 03/30/2011 1120   CREATININE 2.1* 03/30/2011 1120      Component Value Date/Time   CALCIUM 8.9 03/30/2011 1120   ALKPHOS 103 03/30/2011 1120   AST 24 03/30/2011 1120   ALT 12 03/30/2011 1120   BILITOT 0.3 03/30/2011 1120     hx of renal insuff -- just went not too long ago to renal - all is stable  No cp or palpitations or headaches or edema  No side effects to medicines    On norvasc - no problems  Sometimes cramps in legs   On zocor for lipids Lab Results  Component Value Date   CHOL 193 03/30/2011   HDL 37.30* 03/30/2011   LDLCALC 143* 03/30/2011   TRIG 64.0 03/30/2011   CHOLHDL 5 03/30/2011    Review of Systems  Constitutional: Negative for fever, appetite change, fatigue and unexpected weight change.  Eyes: Negative for pain and visual disturbance.  Respiratory: Negative for cough and shortness of breath.   Cardiovascular: Negative for cp or palpitations    Gastrointestinal: Negative for nausea, diarrhea and constipation.  Genitourinary: Negative for urgency and frequency.  Skin: Negative for pallor or rash   MSK occ aches and pains/ occ leg cramps  Neurological: Negative for weakness, light-headedness, numbness and headaches.  Hematological: Negative for adenopathy. Does not bruise/bleed easily.    Psychiatric/Behavioral: Negative for dysphoric mood. The patient is not nervous/anxious.        Review of Systems     Objective:   Physical Exam  Constitutional: She appears well-developed and well-nourished. No distress.       Frail appearing elderly female   HENT:  Head: Normocephalic and atraumatic.  Mouth/Throat: Oropharynx is clear and moist.  Eyes: Conjunctivae and EOM are normal. Pupils are equal, round, and reactive to light. No scleral icterus.  Neck: Normal range of motion. Neck supple. Carotid bruit is not present. Erythema present. No tracheal deviation present. No thyromegaly present.  Cardiovascular: Normal rate, regular rhythm and normal heart sounds.  Exam reveals no gallop.   Pulmonary/Chest: Effort normal and breath sounds normal. No respiratory distress. She has no wheezes.  Abdominal: Soft. Bowel sounds are normal. She exhibits no distension, no abdominal bruit and no mass. There is no tenderness.  Musculoskeletal: She exhibits edema. She exhibits no tenderness.  Lymphadenopathy:    She has no cervical adenopathy.  Neurological: She is alert. She has normal reflexes. No cranial nerve deficit. She exhibits normal muscle tone. Coordination normal.  Skin: Skin is warm and dry. No rash noted. No erythema. No pallor.  Psychiatric: She has a normal mood and affect.  Quiet and calm - pleasant          Assessment & Plan:

## 2011-10-10 ENCOUNTER — Encounter: Payer: Self-pay | Admitting: *Deleted

## 2011-10-11 ENCOUNTER — Emergency Department: Payer: Self-pay | Admitting: Emergency Medicine

## 2011-10-12 ENCOUNTER — Telehealth: Payer: Self-pay | Admitting: Family Medicine

## 2011-10-12 NOTE — Telephone Encounter (Signed)
agree

## 2011-10-12 NOTE — Telephone Encounter (Signed)
Triage Record Num: [id number] Operator: Terance Ice Patient Name: [facility] Call Date & Time: 10/11/2011 7:27:36PM Patient Phone: [phone] PCP: Audrie Gallus. Tower Patient Gender: Female PCP Fax : Patient DOB: 1920/06/02 Practice Name: [facility] [place] Reason for Call: Caller: [name]/daughter; PCP: Roxy Manns A.; CB#: [phone]; Call regarding Cuts, Scrapes, Abrasions; cut thumb with knife on 10/11/11 at 1800 appx; bleeding stopped within 10 min; cut is 1-1/12 inch long that is closed "folded over" but gaps open when bending thumb joint as cut is right at the joint; Guideline: Abrasions, Lacerations, Puncture Wounds; Disp: see ED immediately r/t wound is at joint and japes when joint bent; Appt? no, send to UC/ED, states will go to ED. Protocol(s) Used: Abrasions, Lacerations, Puncture Wounds Protocol(s) Used: Hand Injury Recommended Outcome per Protocol: See ED Immediately Reason for Outcome: Cut, abrasion, laceration or puncture wound is the primary concern Wound is on or near joint AND gapes when joint is bent Care Advice: ~ Another adult should drive. ~ Do not give the patient anything to eat or drink. ~ Support injured part in position of comfort to reduce pain and prevent further damage; avoid unnecessary movement. ~ IMMEDIATE ACTION Write down provider's name. List or place the following in a bag for transport with the patient: current prescription and/or nonprescription medications; alternative treatments, therapies and medications; and street drugs. ~ 10/11/2011 7:43:14PM Page 1 of 1 [email or web address]

## 2011-10-14 NOTE — Assessment & Plan Note (Signed)
bp in fair control at this time  No changes needed  Disc lifstyle change with low sodium diet and exercise   No change in med  

## 2011-10-14 NOTE — Assessment & Plan Note (Signed)
Much imp with imp of urinary retention/ cath for urine  Continue to monitor   Chemistry      Component Value Date/Time   NA 140 10/09/2011 1155   K 4.5 10/09/2011 1155   CL 107 10/09/2011 1155   CO2 23 10/09/2011 1155   BUN 41* 10/09/2011 1155   CREATININE 1.8* 10/09/2011 1155      Component Value Date/Time   CALCIUM 8.7 10/09/2011 1155   ALKPHOS 89 10/09/2011 1155   AST 30 10/09/2011 1155   ALT 16 10/09/2011 1155   BILITOT 0.2* 10/09/2011 1155     continues to see renal for obs

## 2011-11-06 ENCOUNTER — Ambulatory Visit (INDEPENDENT_AMBULATORY_CARE_PROVIDER_SITE_OTHER): Payer: Medicare Other | Admitting: Family Medicine

## 2011-11-06 DIAGNOSIS — I82409 Acute embolism and thrombosis of unspecified deep veins of unspecified lower extremity: Secondary | ICD-10-CM

## 2011-11-06 DIAGNOSIS — Z7901 Long term (current) use of anticoagulants: Secondary | ICD-10-CM

## 2011-11-06 DIAGNOSIS — Z5181 Encounter for therapeutic drug level monitoring: Secondary | ICD-10-CM

## 2011-11-06 NOTE — Patient Instructions (Signed)
Continue current dose, check in 4 weeks  

## 2011-12-04 ENCOUNTER — Ambulatory Visit (INDEPENDENT_AMBULATORY_CARE_PROVIDER_SITE_OTHER): Payer: Medicare Other | Admitting: Family Medicine

## 2011-12-04 DIAGNOSIS — Z7901 Long term (current) use of anticoagulants: Secondary | ICD-10-CM

## 2011-12-04 DIAGNOSIS — I82409 Acute embolism and thrombosis of unspecified deep veins of unspecified lower extremity: Secondary | ICD-10-CM

## 2011-12-04 DIAGNOSIS — Z5181 Encounter for therapeutic drug level monitoring: Secondary | ICD-10-CM

## 2011-12-04 NOTE — Patient Instructions (Signed)
Continue 2.5 mg daily except 5 mg Tues, Fri,   then recheck 4 weeks

## 2011-12-17 ENCOUNTER — Other Ambulatory Visit: Payer: Self-pay | Admitting: Family Medicine

## 2011-12-17 NOTE — Telephone Encounter (Signed)
Last OV 10/09/11. Ok to refill?

## 2011-12-17 NOTE — Telephone Encounter (Signed)
Px written for call in   

## 2012-01-01 ENCOUNTER — Ambulatory Visit (INDEPENDENT_AMBULATORY_CARE_PROVIDER_SITE_OTHER): Payer: Medicare Other | Admitting: Internal Medicine

## 2012-01-01 DIAGNOSIS — Z7901 Long term (current) use of anticoagulants: Secondary | ICD-10-CM

## 2012-01-01 DIAGNOSIS — I82409 Acute embolism and thrombosis of unspecified deep veins of unspecified lower extremity: Secondary | ICD-10-CM

## 2012-01-01 DIAGNOSIS — Z5181 Encounter for therapeutic drug level monitoring: Secondary | ICD-10-CM

## 2012-01-01 NOTE — Patient Instructions (Signed)
Continue current dose, check in 4 weeks  

## 2012-01-29 ENCOUNTER — Ambulatory Visit (INDEPENDENT_AMBULATORY_CARE_PROVIDER_SITE_OTHER): Payer: Medicare Other | Admitting: Family Medicine

## 2012-01-29 ENCOUNTER — Encounter: Payer: Self-pay | Admitting: Family Medicine

## 2012-01-29 VITALS — BP 130/72 | HR 68 | Temp 97.6°F | Ht 65.0 in | Wt 146.8 lb

## 2012-01-29 DIAGNOSIS — M79609 Pain in unspecified limb: Secondary | ICD-10-CM

## 2012-01-29 DIAGNOSIS — Z7901 Long term (current) use of anticoagulants: Secondary | ICD-10-CM

## 2012-01-29 DIAGNOSIS — M79604 Pain in right leg: Secondary | ICD-10-CM | POA: Insufficient documentation

## 2012-01-29 DIAGNOSIS — I739 Peripheral vascular disease, unspecified: Secondary | ICD-10-CM

## 2012-01-29 DIAGNOSIS — I82409 Acute embolism and thrombosis of unspecified deep veins of unspecified lower extremity: Secondary | ICD-10-CM

## 2012-01-29 DIAGNOSIS — Z5181 Encounter for therapeutic drug level monitoring: Secondary | ICD-10-CM

## 2012-01-29 DIAGNOSIS — M79605 Pain in left leg: Secondary | ICD-10-CM | POA: Insufficient documentation

## 2012-01-29 LAB — POCT INR: INR: 3.3

## 2012-01-29 NOTE — Assessment & Plan Note (Signed)
Worse with standing No acute findings on exam/ nl rom joints without pain/ no swelling/ varicosities stable and ambulates with walker  Will hold statin for 2 weeks and update to see if that is the cause  If not - will do ABIs since pedal pulses were absent  Doubt this is her PMR since pain is in lower body only

## 2012-01-29 NOTE — Progress Notes (Signed)
Subjective:    Patient ID: Tracy Morales, female    DOB: [birth year], 78 y.o.   MRN: [id number]  HPIHere for leg pain  Both hurt at times L one hurts more  Starts from thigh down whole leg - worse after standing  Can stand about 30 min at a time , ? How long she can walk for  She uses a walker all the time  Not more swollen than usual   No falls or accidents  Pain has been off and on for about 2 months Tylenol does helps some- uses sparingly   Back is not hurting    Hx of R DVT -on coumadin  tx INR on 7/30 She is on zocor   Also venous insuff Also PVD  Also hx of PMR- but arms and shoulder girdle no longer hurt  Patient Active Problem List  Diagnosis  . HYPERLIPIDEMIA  . HYPERPOTASSEMIA  . PERIPHERAL NEUROPATHY, LOWER EXTREMITIES, BILATERAL  . CONDUCTIVE HEARING LOSS BILATERAL  . HYPERTENSION  . BRADYCARDIA  . DIASTOLIC DYSFUNCTION  . CAROTID STENOSIS  . CEREBROVASCULAR DISEASE  . PERIPHERAL VASCULAR DISEASE  . DVT  . ALLERGIC RHINITIS  . RENAL INSUFFICIENCY, CHRONIC  . OVERACTIVE BLADDER  . MICROSCOPIC HEMATURIA  . OSTEOARTHRITIS  . POLYMYALGIA RHEUMATICA, HX OF  . EDEMA  . DYSPNEA  . INCONTINENCE, URGE  . CARDIAC MURMUR, HX OF  . Heel pain   Past Medical History  Diagnosis Date  . Dyspnea     echo with normal EF. +diastolic dysfunction  . Occlusion and stenosis of carotid artery without mention of cerebral infarction 4/09    u/s L 40-59%, R 0-39%; stable for many years  . Peripheral vascular disease, unspecified   . Bradycardia   . HTN (hypertension)   . Hyperlipidemia   . RUQ pain   . Urge incontinence   . Chronic kidney disease, unspecified     chronic renal insufficiency  . Mononeuritis of lower limb, unspecified     peripheral neuropathy, bilateral  . Cerebrovascular disease, unspecified   . Osteoarthrosis, unspecified whether generalized or localized, unspecified site   . Dizziness and giddiness   . Allergic rhinitis, cause unspecified     . Polymyalgia rheumatica     hx  . Hypertonicity of bladder     hx  . DVT (deep venous thrombosis)     R  . 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 Mild depression    Past Surgical History  Procedure Date  . Cataract extraction   . Cholecystectomy 2000  . Carotid doppler 4/02    bilateral plaque   . Echo     mild LVH EF 50% As  . Arterial doppler   . Vert. basilar insufficiency 2002  . Dexa 2/01    osteopenia  . Carotid doppler 8/06    non significant stenosis  . Zoster 5/02  . Colonoscopy 1/04    polyps  . 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 2d echo 8/06    mild aortic stenosis.   . Carotid doppler     R 40%; L 40-60%  . Ct scan 05/25/08    abd and pelvis- inflated urinary bladder and large amt stool throughout w/o blockage    History  Substance Use Topics  . Smoking status: Never Smoker   . Smokeless tobacco: Never Used   Comment: non smoker   . Alcohol Use: No   Family History  Problem Relation Age of Onset  .  Hypertension Father   . Other Father     Cardiac problems  . Heart disease Father   . Other Brother     blood clot  . Other Sister     benign breast nodule   Allergies  Allergen Reactions  . Ace Inhibitors     REACTION: cough   Current Outpatient Prescriptions on File Prior to Visit  Medication Sig Dispense Refill  . acetaminophen (TYLENOL) 500 MG tablet Take 500 mg by mouth every 6 (six) hours as needed. OTC-UAD       . amLODipine (NORVASC) 5 MG tablet Take 5 mg by mouth daily.        Marland Kitchen aspirin 81 MG EC tablet Take 81 mg by mouth daily.        . ferrous sulfate 325 (65 FE) MG tablet Take 325 mg by mouth 2 (two) times daily with a meal.        . Meclizine HCl (BONINE PO) Take by mouth as needed.      . mirtazapine (REMERON) 30 MG tablet Take 1 tablet (30 mg total) by mouth at bedtime.  30 tablet  11  . NON FORMULARY Stool softener (unsure of name). OTC-UAD       . NON FORMULARY Walker with wheels - for use with ambulation once daily.  429.9, 715.90, 355.9       . polyethylene glycol powder (MIRALAX) powder Take 17 g by mouth daily. OTC  UAD       . simvastatin (ZOCOR) 20 MG tablet TAKE 1 TABLET (20 MG TOTAL) BY MOUTH DAILY.  30 tablet  11  . warfarin (COUMADIN) 1 MG tablet Take 1 tablet (1 mg total) by mouth as directed.  90 tablet  3  . warfarin (COUMADIN) 5 MG tablet Take as directed  30 tablet  3  . warfarin (COUMADIN) 5 MG tablet TAKE 1 TABLET (5 MG TOTAL) BY MOUTH DAILY.  30 tablet  0   last Friday- did a lot of house work and cooking  Runs her own vacuum cleaner    Review of Systems Review of Systems  Constitutional: Negative for fever, appetite change, fatigue and unexpected weight change.  Eyes: Negative for pain and visual disturbance.  Respiratory: Negative for cough and shortness of breath.   Cardiovascular: Negative for cp or palpitations    Gastrointestinal: Negative for nausea, diarrhea and constipation.  Genitourinary: Negative for urgency and frequency.  Skin: Negative for pallor or rash   MSK pos for diffuse leg pain - worse with walking and standing / neg for swollen joints or redness Neurological: Negative for weakness, light-headedness, numbness and headaches.  Hematological: Negative for adenopathy. Does not bruise/bleed easily.  Psychiatric/Behavioral: Negative for dysphoric mood. The patient is not nervous/anxious.         Objective:   Physical Exam  Constitutional: She appears well-developed and well-nourished. No distress.       Frail appearing elderly female in no distress   HENT:  Head: Normocephalic and atraumatic.  Mouth/Throat: Oropharynx is clear and moist.  Eyes: Conjunctivae and EOM are normal. Pupils are equal, round, and reactive to light. Right eye exhibits no discharge. Left eye exhibits no discharge. No scleral icterus.  Neck: Normal range of motion. Neck supple.  Cardiovascular: Normal rate and regular rhythm.        Unable to palpate pedal pulses, though her feet felt  warm and well perfused   Pulmonary/Chest: Effort normal and breath sounds normal. No respiratory distress. She has  no wheezes.  Abdominal: Soft. Bowel sounds are normal. She exhibits no distension and no mass. There is no tenderness.  Musculoskeletal: She exhibits no edema and no tenderness.       Varicosities noted- compressible without ulceration or skin change  No tenderness -myofascial or bony Nl rom LS and hips and knees and ankle without pain  No palp cords Neg homan's sign  Lymphadenopathy:    She has no cervical adenopathy.  Neurological: She is alert. She has normal reflexes. No cranial nerve deficit. She exhibits normal muscle tone. Coordination normal.       Gait is steady with walker or assistance, was able to get up on the table   Skin: Skin is warm and dry. No rash noted. No erythema. No pallor.  Psychiatric: She has a normal mood and affect.       Mentally sharp           Assessment & Plan:

## 2012-01-29 NOTE — Patient Instructions (Addendum)
Hold your zocor/ simvastatin for 2 weeks -just to see if this could cause your leg pain  Call and update me in 2 weeks with how symptoms are If not improved- I'm going to set up a test for blood circulation in your legs  INR/ protime today

## 2012-01-29 NOTE — Assessment & Plan Note (Signed)
Pt has non palpable pedal pulses /hx of PVD Her feet feel warm and well perfused, however  ? If her leg pain could be related Will first try stopping statin to see if that is the cause , then if not, order ABIs

## 2012-01-29 NOTE — Patient Instructions (Signed)
Continue 2.5 mg daily except 5 mg Tues, Fri,   then recheck 4 weeks 

## 2012-02-12 ENCOUNTER — Telehealth: Payer: Self-pay | Admitting: Family Medicine

## 2012-02-12 DIAGNOSIS — I739 Peripheral vascular disease, unspecified: Secondary | ICD-10-CM

## 2012-02-12 DIAGNOSIS — M79604 Pain in right leg: Secondary | ICD-10-CM

## 2012-02-12 NOTE — Telephone Encounter (Signed)
Ok , re start the zocor and I will order ABIs to check her arterial circulation in her legs

## 2012-02-12 NOTE — Telephone Encounter (Signed)
Caller: [name]/Child; Phone: [phone]; Reason for Call: Dr Milinda Antis asked patient to call back in 2 weeks and give status update.  [name] wants Dr Milinda Antis to know that taking patient off of Zocor has not improved leg pain.

## 2012-02-13 ENCOUNTER — Telehealth: Payer: Self-pay | Admitting: Family Medicine

## 2012-02-13 ENCOUNTER — Other Ambulatory Visit: Payer: Self-pay

## 2012-02-13 DIAGNOSIS — I739 Peripheral vascular disease, unspecified: Secondary | ICD-10-CM

## 2012-02-13 DIAGNOSIS — M79604 Pain in right leg: Secondary | ICD-10-CM

## 2012-02-13 NOTE — Telephone Encounter (Signed)
Order for ABIs

## 2012-02-13 NOTE — Telephone Encounter (Signed)
Patient daughter notified

## 2012-02-14 ENCOUNTER — Encounter (INDEPENDENT_AMBULATORY_CARE_PROVIDER_SITE_OTHER): Payer: Medicare Other

## 2012-02-14 DIAGNOSIS — M79604 Pain in right leg: Secondary | ICD-10-CM

## 2012-02-14 DIAGNOSIS — I739 Peripheral vascular disease, unspecified: Secondary | ICD-10-CM

## 2012-02-14 DIAGNOSIS — M79605 Pain in left leg: Secondary | ICD-10-CM

## 2012-02-25 ENCOUNTER — Ambulatory Visit (INDEPENDENT_AMBULATORY_CARE_PROVIDER_SITE_OTHER): Payer: Medicare Other | Admitting: Family Medicine

## 2012-02-25 ENCOUNTER — Ambulatory Visit: Payer: Medicare Other

## 2012-02-25 ENCOUNTER — Ambulatory Visit (INDEPENDENT_AMBULATORY_CARE_PROVIDER_SITE_OTHER)
Admission: RE | Admit: 2012-02-25 | Discharge: 2012-02-25 | Disposition: A | Discharge status: Home / Self Care | Payer: Medicare Other | Source: Ambulatory Visit | Attending: Family Medicine | Admitting: Family Medicine

## 2012-02-25 ENCOUNTER — Encounter: Payer: Self-pay | Admitting: Family Medicine

## 2012-02-25 VITALS — BP 130/66 | HR 72 | Temp 97.6°F | Wt 146.0 lb

## 2012-02-25 DIAGNOSIS — Z7901 Long term (current) use of anticoagulants: Secondary | ICD-10-CM

## 2012-02-25 DIAGNOSIS — M5416 Radiculopathy, lumbar region: Secondary | ICD-10-CM

## 2012-02-25 DIAGNOSIS — Z5181 Encounter for therapeutic drug level monitoring: Secondary | ICD-10-CM

## 2012-02-25 DIAGNOSIS — I82409 Acute embolism and thrombosis of unspecified deep veins of unspecified lower extremity: Secondary | ICD-10-CM

## 2012-02-25 DIAGNOSIS — IMO0002 Reserved for concepts with insufficient information to code with codable children: Secondary | ICD-10-CM

## 2012-02-25 DIAGNOSIS — Z23 Encounter for immunization: Secondary | ICD-10-CM

## 2012-02-25 LAB — POCT INR: INR: 3

## 2012-02-25 MED ORDER — PREDNISONE 10 MG PO TABS: ORAL_TABLET | ORAL | Status: DC

## 2012-02-25 NOTE — Addendum Note (Signed)
Addended by: Eliezer Bottom on: 02/25/2012 11:22 AM   Modules accepted: Orders

## 2012-02-25 NOTE — Patient Instructions (Signed)
Continue current dose, check in 4 weeks  

## 2012-02-25 NOTE — Progress Notes (Signed)
Nature conservation officer at [facility] [address] [place] [address] Phone: [phone] Fax: [phone]  Date:  02/25/2012   Name:  Tracy Morales   DOB:  January 24, 1920   MRN:  [id number] Gender: female Age: 78 y.o.  PCP:  Roxy Manns, MD    Chief Complaint: Pain in back of both legs   History of Present Illness:  Tracy Morales is a 78 y.o. pleasant patient who presents with the following:  78 year old, seen at request of Dr. Milinda Antis: The patient is having back pain and radiculopathy down bilateral legs. Checks his arm he had ankle-brachial indexes, which were normal.  Legs in back of legs are hurting. Has been bothering for about a month. All the way to feet. Uses a walker. Lives by self - grandchild, lives with her.   She has not any, or falls. No prior back operations. No prior hip surgery. She denies any new numbness or any focal weakness. She does walk with a walker, but that is her baseline.  Her current symptoms have been ongoing for approximately the last 5-6 weeks and are under exacerbation.  Patient Active Problem List  Diagnosis  . HYPERLIPIDEMIA  . HYPERPOTASSEMIA  . PERIPHERAL NEUROPATHY, LOWER EXTREMITIES, BILATERAL  . CONDUCTIVE HEARING LOSS BILATERAL  . HYPERTENSION  . BRADYCARDIA  . DIASTOLIC DYSFUNCTION  . CAROTID STENOSIS  . CEREBROVASCULAR DISEASE  . PERIPHERAL VASCULAR DISEASE  . DVT  . ALLERGIC RHINITIS  . RENAL INSUFFICIENCY, CHRONIC  . OVERACTIVE BLADDER  . MICROSCOPIC HEMATURIA  . OSTEOARTHRITIS  . POLYMYALGIA RHEUMATICA, HX OF  . EDEMA  . DYSPNEA  . INCONTINENCE, URGE  . CARDIAC MURMUR, HX OF  . Heel pain  . Leg pain, bilateral    Past Medical History  Diagnosis Date  . Dyspnea     echo with normal EF. +diastolic dysfunction  . Occlusion and stenosis of carotid artery without mention of cerebral infarction 4/09    u/s L 40-59%, R 0-39%; stable for many years  . Peripheral vascular disease, unspecified   . Bradycardia   .  HTN (hypertension)   . Hyperlipidemia   . RUQ pain   . Urge incontinence   . Chronic kidney disease, unspecified     chronic renal insufficiency  . Mononeuritis of lower limb, unspecified     peripheral neuropathy, bilateral  . Cerebrovascular disease, unspecified   . Osteoarthrosis, unspecified whether generalized or localized, unspecified site   . Dizziness and giddiness   . Allergic rhinitis, cause unspecified   . Polymyalgia rheumatica     hx  . Hypertonicity of bladder     hx  . DVT (deep venous thrombosis)     R  . 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 Mild depression     Past Surgical History  Procedure Date  . Cataract extraction   . Cholecystectomy 2000  . Carotid doppler 4/02    bilateral plaque   . Echo     mild LVH EF 50% As  . Arterial doppler   . Vert. basilar insufficiency 2002  . Dexa 2/01    osteopenia  . Carotid doppler 8/06    non significant stenosis  . Zoster 5/02  . Colonoscopy 1/04    polyps  . 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 2d echo 8/06    mild aortic stenosis.   . Carotid doppler     R 40%; L 40-60%  . Ct scan  05/25/08    abd and pelvis- inflated urinary bladder and large amt stool throughout w/o blockage     History  Substance Use Topics  . Smoking status: Never Smoker   . Smokeless tobacco: Never Used   Comment: non smoker   . Alcohol Use: No    Family History  Problem Relation Age of Onset  . Hypertension Father   . Other Father     Cardiac problems  . Heart disease Father   . Other Brother     blood clot  . Other Sister     benign breast nodule    Allergies  Allergen Reactions  . Ace Inhibitors     REACTION: cough    Medication list has been reviewed and updated.  Outpatient Prescriptions Prior to Visit  Medication Sig Dispense Refill  . acetaminophen (TYLENOL) 500 MG tablet Take 500 mg by mouth every 6 (six) hours as needed. OTC-UAD       . amLODipine (NORVASC) 5 MG tablet Take 5 mg by mouth  daily.       Marland Kitchen aspirin 81 MG EC tablet Take 81 mg by mouth daily.        . ferrous sulfate 325 (65 FE) MG tablet Take 325 mg by mouth 2 (two) times daily with a meal.        . Meclizine HCl (BONINE PO) Take by mouth as needed.      . mirtazapine (REMERON) 30 MG tablet Take 1 tablet (30 mg total) by mouth at bedtime.  30 tablet  11  . NON FORMULARY Stool softener (unsure of name). OTC-UAD       . NON FORMULARY Walker with wheels - for use with ambulation once daily. 429.9, 715.90, 355.9       . polyethylene glycol powder (MIRALAX) powder Take 17 g by mouth daily. OTC  UAD       . simvastatin (ZOCOR) 20 MG tablet TAKE 1 TABLET (20 MG TOTAL) BY MOUTH DAILY.  30 tablet  11  . warfarin (COUMADIN) 1 MG tablet Take 1 tablet (1 mg total) by mouth as directed.  90 tablet  3  . warfarin (COUMADIN) 5 MG tablet Take as directed  30 tablet  3  . warfarin (COUMADIN) 5 MG tablet TAKE 1 TABLET (5 MG TOTAL) BY MOUTH DAILY.  30 tablet  0    Review of Systems:   GEN: No fevers, chills. Nontoxic. Primarily MSK c/o today. MSK: Detailed in the HPI GI: tolerating PO intake without difficulty Neuro: detailed above Otherwise the pertinent positives of the ROS are noted above.    Physical Examination: Filed Vitals:   02/25/12 0912  BP: 130/66  Pulse: 72  Temp: 97.6 F (36.4 C)   Filed Vitals:   02/25/12 0912  Weight: 146 lb (66.225 kg)   There is no height on file to calculate BMI. Ideal Body Weight:     GEN: Well-developed,well-nourished,in no acute distress; alert,appropriate and cooperative throughout examination HEENT: Normocephalic and atraumatic without obvious abnormalities. Ears, externally no deformities PULM: Breathing comfortably in no respiratory distress EXT: No clubbing, cyanosis, or edema PSYCH: Normally interactive. Cooperative during the interview.   Range of motion at  the waist: Flexion, extension, lateral bending and rotation:  Patient examined while seated.   No echymosis or  edema Rises to examination table with mild difficulty Gait: minimally antalgic - uses walker  Inspection/Deformity: N Paraspinus Tenderness: L4-S1  B Ankle Dorsiflexion (L5,4): 5/5 B Great Toe Dorsiflexion (L5,4): 5/5 Heel  Walk (L5): WNL Toe Walk (S1): WNL Rise/Squat (L4): WNL, mild pain  SENSORY B Medial Foot (L4): WNL B Dorsum (L5): WNL B Lateral (S1): WNL Light Touch: WNL Pinprick: WNL  REFLEXES Knee (L4): 2+ Ankle (S1): 2+  B SLR, seated: neg Mild to moderate restriction and bilateral hip internal and external rotation. B Greater Troch: NT B Sciatic Notch: NT   Assessment and Plan:  1. Lumbar radiculopathy  DG Lumbar Spine Complete, predniSONE (DELTASONE) 10 MG tablet   Back pain with radiculopathy. Probable foraminal encroachment versus spinal cord impingement versus spinal stenosis, versus other pathology causing radicular symptoms.  At 78 years of age, cautioned her and recommended continued conservative management. She has been using Tylenol as needed. Low dose of prednisone over the next week.  Orders Today:  Orders Placed This Encounter  Procedures  . DG Lumbar Spine Complete    Standing Status: Future     Number of Occurrences: 1     Standing Expiration Date: 04/26/2013    Order Specific Question:  Preferred imaging location?    Answer:  [facility]    Order Specific Question:  Reason for exam:    Answer:  radicular back pain    Updated Medication List: (Includes new medications, updates to list, dose adjustments) Outpatient Encounter Prescriptions as of 02/25/2012  Medication Sig Dispense Refill  . acetaminophen (TYLENOL) 500 MG tablet Take 500 mg by mouth every 6 (six) hours as needed. OTC-UAD       . amLODipine (NORVASC) 5 MG tablet Take 5 mg by mouth daily.       Marland Kitchen aspirin 81 MG EC tablet Take 81 mg by mouth daily.        . ferrous sulfate 325 (65 FE) MG tablet Take 325 mg by mouth 2 (two) times daily with a meal.        . Meclizine HCl  (BONINE PO) Take by mouth as needed.      . mirtazapine (REMERON) 30 MG tablet Take 1 tablet (30 mg total) by mouth at bedtime.  30 tablet  11  . NON FORMULARY Stool softener (unsure of name). OTC-UAD       . NON FORMULARY Walker with wheels - for use with ambulation once daily. 429.9, 715.90, 355.9       . polyethylene glycol powder (MIRALAX) powder Take 17 g by mouth daily. OTC  UAD       . simvastatin (ZOCOR) 20 MG tablet TAKE 1 TABLET (20 MG TOTAL) BY MOUTH DAILY.  30 tablet  11  . warfarin (COUMADIN) 1 MG tablet Take 1 tablet (1 mg total) by mouth as directed.  90 tablet  3  . warfarin (COUMADIN) 5 MG tablet Take as directed  30 tablet  3  . warfarin (COUMADIN) 5 MG tablet TAKE 1 TABLET (5 MG TOTAL) BY MOUTH DAILY.  30 tablet  0  . predniSONE (DELTASONE) 10 MG tablet 2 tablets po for 4 days, then 1 tablet for 3 days  11 tablet  0    Medications Discontinued: There are no discontinued medications.   Hannah Beat, MD,

## 2012-02-26 ENCOUNTER — Ambulatory Visit: Payer: Medicare Other

## 2012-03-03 ENCOUNTER — Other Ambulatory Visit: Payer: Self-pay | Admitting: Family Medicine

## 2012-03-24 ENCOUNTER — Ambulatory Visit: Payer: Medicare Other

## 2012-03-24 ENCOUNTER — Telehealth: Payer: Self-pay | Admitting: Family Medicine

## 2012-03-24 NOTE — Telephone Encounter (Signed)
Will put px in IN box to fax to medical supply- lets see if this helps

## 2012-03-24 NOTE — Telephone Encounter (Signed)
Caller: [name]/Other; Patient Name: Tracy Morales; PCP: Roxy Manns [facility] Phone Number: [phone]. Caller reports patient has ongoing leg pain for approx 2 months and she asks if compression stockings may help her feel better.  She relates she has been seen by Dr. Milinda Antis and [facility] regarding this pain; negative x-ray and ultrasounds per caller.   Bilateral pain reported; estimated at 6-7 of 10.  Pain worse with activity and improves with rest. Emergent symptoms ruled out.  Home care per Leg Non-Injury protocol. Note to office for follow up related to caller question regarding copression stockings/ possible Rx for same. Caller asks to use [employer] company for this if ordered. [phone]).

## 2012-03-24 NOTE — Telephone Encounter (Signed)
We can try support stockings to waist and see if this helps for venous insufficiency I will put px in IN box to fax to med supply co

## 2012-03-24 NOTE — Telephone Encounter (Signed)
Faxed in Rx and notified pt's daughter Maren Reamer

## 2012-03-25 ENCOUNTER — Ambulatory Visit (INDEPENDENT_AMBULATORY_CARE_PROVIDER_SITE_OTHER): Payer: Medicare Other | Admitting: Family Medicine

## 2012-03-25 DIAGNOSIS — I82409 Acute embolism and thrombosis of unspecified deep veins of unspecified lower extremity: Secondary | ICD-10-CM

## 2012-03-25 DIAGNOSIS — Z7901 Long term (current) use of anticoagulants: Secondary | ICD-10-CM

## 2012-03-25 DIAGNOSIS — Z5181 Encounter for therapeutic drug level monitoring: Secondary | ICD-10-CM

## 2012-03-25 NOTE — Patient Instructions (Signed)
Continue current dose, check in 4 weeks  

## 2012-04-11 ENCOUNTER — Ambulatory Visit (INDEPENDENT_AMBULATORY_CARE_PROVIDER_SITE_OTHER): Payer: Medicare Other | Admitting: Family Medicine

## 2012-04-11 ENCOUNTER — Encounter: Payer: Self-pay | Admitting: Family Medicine

## 2012-04-11 VITALS — BP 146/68 | HR 80 | Temp 97.7°F | Ht 65.0 in | Wt 144.5 lb

## 2012-04-11 DIAGNOSIS — I10 Essential (primary) hypertension: Secondary | ICD-10-CM

## 2012-04-11 DIAGNOSIS — Z9181 History of falling: Secondary | ICD-10-CM

## 2012-04-11 DIAGNOSIS — M79609 Pain in unspecified limb: Secondary | ICD-10-CM

## 2012-04-11 DIAGNOSIS — M79604 Pain in right leg: Secondary | ICD-10-CM

## 2012-04-11 DIAGNOSIS — N189 Chronic kidney disease, unspecified: Secondary | ICD-10-CM

## 2012-04-11 MED ORDER — MIRTAZAPINE 30 MG PO TABS: 30.0000 mg | ORAL_TABLET | Freq: Every day | ORAL | Status: DC

## 2012-04-11 NOTE — Assessment & Plan Note (Signed)
bp in fair control at this time  No changes needed  Disc lifstyle change with low sodium diet and exercise   Was better on 2nd check

## 2012-04-11 NOTE — Assessment & Plan Note (Signed)
With nl ABIs  Thought to be due to spinal stenosis/ deg LS dz  Imp after course of prednisone  Tylenol prn  Uses walker Overall improved

## 2012-04-11 NOTE — Assessment & Plan Note (Signed)
Due to mobility issues Urged pt to use walker instead of a cane -all the time Also disc getting rugs off the floor Caregiver present No falls or fractures

## 2012-04-11 NOTE — Progress Notes (Signed)
Subjective:    Patient ID: Tracy Morales, female    DOB: 05-27-1920, 78 y.o.   MRN: [id number]  HPI Here for f/u of chronic conditions  Saw Dr Patsy Lager for leg pain after ABIs were neg -thought to be likely spinal stenosis Walking with walker Took a prednisone pack  L leg bothers her the most  Overall is better than it was   occ has a dizzy / vertigo episode - but the meclizine helps with just one dose   On mirtazapine for appetite- it helps tremendously  Wt is down 2 lb (but overall much better than in 2012)    bp is stable today  No cp or palpitations or headaches or edema  No side effects to medicines  BP Readings from Last 3 Encounters:  04/11/12 146/68  02/25/12 130/66  01/29/12 130/72      Hyperlipidemia Lab Results  Component Value Date   CHOL 143 10/09/2011   CHOL 193 03/30/2011   CHOL 156 02/24/2010   Lab Results  Component Value Date   HDL 33.80* 10/09/2011   HDL 40.98* 03/30/2011   HDL 31.70* 02/24/2010   Lab Results  Component Value Date   LDLCALC 88 10/09/2011   LDLCALC 143* 03/30/2011   LDLCALC 105* 02/24/2010   Lab Results  Component Value Date   TRIG 105.0 10/09/2011   TRIG 64.0 03/30/2011   TRIG 96.0 02/24/2010   Lab Results  Component Value Date   CHOLHDL 4 10/09/2011   CHOLHDL 5 03/30/2011   CHOLHDL 5 02/24/2010   No results found for this basename: LDLDIRECT   on statin and diet  Figured out the statin did not cause pain  Uses a cane or a walker No falls and no broken bones  Most of her chairs have arms on them Needs to pick up rugs in the house  Mood has been very good   Patient Active Problem List  Diagnosis  . HYPERLIPIDEMIA  . HYPERPOTASSEMIA  . PERIPHERAL NEUROPATHY, LOWER EXTREMITIES, BILATERAL  . CONDUCTIVE HEARING LOSS BILATERAL  . HYPERTENSION  . BRADYCARDIA  . DIASTOLIC DYSFUNCTION  . CAROTID STENOSIS  . CEREBROVASCULAR DISEASE  . PERIPHERAL VASCULAR DISEASE  . DVT  . ALLERGIC RHINITIS  . RENAL INSUFFICIENCY, CHRONIC   . OVERACTIVE BLADDER  . MICROSCOPIC HEMATURIA  . OSTEOARTHRITIS  . POLYMYALGIA RHEUMATICA, HX OF  . EDEMA  . DYSPNEA  . INCONTINENCE, URGE  . CARDIAC MURMUR, HX OF  . Heel pain  . Leg pain, bilateral  . Risk for falls   Past Medical History  Diagnosis Date  . Dyspnea     echo with normal EF. +diastolic dysfunction  . Occlusion and stenosis of carotid artery without mention of cerebral infarction 4/09    u/s L 40-59%, R 0-39%; stable for many years  . Peripheral vascular disease, unspecified   . Bradycardia   . HTN (hypertension)   . Hyperlipidemia   . RUQ pain   . Urge incontinence   . Chronic kidney disease, unspecified     chronic renal insufficiency  . Mononeuritis of lower limb, unspecified     peripheral neuropathy, bilateral  . Cerebrovascular disease, unspecified   . Osteoarthrosis, unspecified whether generalized or localized, unspecified site   . Dizziness and giddiness   . Allergic rhinitis, cause unspecified   . Polymyalgia rheumatica     hx  . Hypertonicity of bladder     hx  . DVT (deep venous thrombosis)     R  .  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 Mild depression    Past Surgical History  Procedure Date  . Cataract extraction   . Cholecystectomy 2000  . Carotid doppler 4/02    bilateral plaque   . Echo     mild LVH EF 50% As  . Arterial doppler   . Vert. basilar insufficiency 2002  . Dexa 2/01    osteopenia  . Carotid doppler 8/06    non significant stenosis  . Zoster 5/02  . Colonoscopy 1/04    polyps  . 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 2d echo 8/06    mild aortic stenosis.   . Carotid doppler     R 40%; L 40-60%  . Ct scan 05/25/08    abd and pelvis- inflated urinary bladder and large amt stool throughout w/o blockage    History  Substance Use Topics  . Smoking status: Never Smoker   . Smokeless tobacco: Never Used     Comment: non smoker   . Alcohol Use: No   Family History  Problem Relation Age of Onset  .  Hypertension Father   . Other Father     Cardiac problems  . Heart disease Father   . Other Brother     blood clot  . Other Sister     benign breast nodule   Allergies  Allergen Reactions  . Ace Inhibitors     REACTION: cough   Current Outpatient Prescriptions on File Prior to Visit  Medication Sig Dispense Refill  . acetaminophen (TYLENOL) 500 MG tablet Take 500 mg by mouth every 6 (six) hours as needed. OTC-UAD       . amLODipine (NORVASC) 5 MG tablet Take 5 mg by mouth daily.       Marland Kitchen aspirin 81 MG EC tablet Take 81 mg by mouth daily.        . ferrous sulfate 325 (65 FE) MG tablet Take 325 mg by mouth 2 (two) times daily with a meal.        . Meclizine HCl (BONINE PO) Take by mouth as needed.      . mirtazapine (REMERON) 30 MG tablet Take 1 tablet (30 mg total) by mouth at bedtime.  30 tablet  11  . NON FORMULARY Stool softener (unsure of name). OTC-UAD       . NON FORMULARY Walker with wheels - for use with ambulation once daily. 429.9, 715.90, 355.9       . polyethylene glycol powder (MIRALAX) powder Take 17 g by mouth daily. OTC  UAD       . simvastatin (ZOCOR) 20 MG tablet TAKE 1 TABLET (20 MG TOTAL) BY MOUTH DAILY.  30 tablet  11  . warfarin (COUMADIN) 1 MG tablet Take 1 tablet (1 mg total) by mouth as directed.  90 tablet  3  . warfarin (COUMADIN) 5 MG tablet Take as directed  30 tablet  3  . warfarin (COUMADIN) 5 MG tablet TAKE 1 TABLET (5 MG TOTAL) BY MOUTH DAILY.  30 tablet  0      Review of Systems Review of Systems  Constitutional: Negative for fever, appetite change, fatigue and unexpected weight change.  Eyes: Negative for pain and visual disturbance.  Respiratory: Negative for cough and shortness of breath.   Cardiovascular: Negative for cp or palpitations    Gastrointestinal: Negative for nausea, diarrhea and constipation.  Genitourinary: Negative for urgency and frequency.  Skin: Negative for pallor or rash  MSK pos for aches and pains - overall  improved Neurological: Negative for weakness, light-headedness, numbness and headaches.  Hematological: Negative for adenopathy. Does not bruise/bleed easily.  Psychiatric/Behavioral: Negative for dysphoric mood. The patient is not nervous/anxious.         Objective:   Physical Exam  Constitutional: She appears well-developed and well-nourished. No distress.       Frail appearing elderly female in no distress  HENT:  Head: Normocephalic and atraumatic.  Mouth/Throat: Oropharynx is clear and moist.  Eyes: Conjunctivae normal and EOM are normal. Pupils are equal, round, and reactive to light. No scleral icterus.  Neck: Normal range of motion. Neck supple. No JVD present. No thyromegaly present.  Cardiovascular: Normal rate and regular rhythm.   Pulmonary/Chest: Effort normal and breath sounds normal. No respiratory distress. She has no wheezes.  Abdominal: Soft. Bowel sounds are normal. She exhibits no distension and no mass. There is no tenderness.  Musculoskeletal: She exhibits no edema and no tenderness.  Lymphadenopathy:    She has no cervical adenopathy.  Neurological: She is alert. She has normal reflexes. No cranial nerve deficit. She exhibits normal muscle tone. Coordination normal.  Skin: Skin is warm and dry. No rash noted. No erythema. No pallor.  Psychiatric: She has a normal mood and affect.          Assessment & Plan:

## 2012-04-11 NOTE — Patient Instructions (Addendum)
Take care of yourself  Make sure to eat regular meals  Continue your hobbies/ sewing Your walker is safer than your cane to prevent falls Make sure to take up all your rugs to prevent falls also  Follow up with me in 6 months

## 2012-04-11 NOTE — Assessment & Plan Note (Signed)
Pt makes effort to get enough fluids   Chemistry      Component Value Date/Time   NA 140 10/09/2011 1155   K 4.5 10/09/2011 1155   CL 107 10/09/2011 1155   CO2 23 10/09/2011 1155   BUN 41* 10/09/2011 1155   CREATININE 1.8* 10/09/2011 1155      Component Value Date/Time   CALCIUM 8.7 10/09/2011 1155   ALKPHOS 89 10/09/2011 1155   AST 30 10/09/2011 1155   ALT 16 10/09/2011 1155   BILITOT 0.2* 10/09/2011 1155     sees renal

## 2012-04-22 ENCOUNTER — Ambulatory Visit (INDEPENDENT_AMBULATORY_CARE_PROVIDER_SITE_OTHER): Payer: Medicare Other | Admitting: Family Medicine

## 2012-04-22 DIAGNOSIS — Z7901 Long term (current) use of anticoagulants: Secondary | ICD-10-CM

## 2012-04-22 DIAGNOSIS — I82409 Acute embolism and thrombosis of unspecified deep veins of unspecified lower extremity: Secondary | ICD-10-CM

## 2012-04-22 DIAGNOSIS — Z5181 Encounter for therapeutic drug level monitoring: Secondary | ICD-10-CM

## 2012-04-22 NOTE — Patient Instructions (Addendum)
Hold next dose then Continue 2.5 mg daily except 5 mg Tues, Fri,   then recheck 2 weeks

## 2012-04-25 ENCOUNTER — Other Ambulatory Visit: Payer: Self-pay | Admitting: *Deleted

## 2012-04-25 DIAGNOSIS — I82409 Acute embolism and thrombosis of unspecified deep veins of unspecified lower extremity: Secondary | ICD-10-CM

## 2012-04-25 MED ORDER — WARFARIN SODIUM 5 MG PO TABS: ORAL_TABLET | ORAL | Status: DC

## 2012-05-08 ENCOUNTER — Ambulatory Visit (INDEPENDENT_AMBULATORY_CARE_PROVIDER_SITE_OTHER): Payer: Medicare Other | Admitting: General Practice

## 2012-05-08 DIAGNOSIS — Z7901 Long term (current) use of anticoagulants: Secondary | ICD-10-CM

## 2012-05-08 DIAGNOSIS — I82409 Acute embolism and thrombosis of unspecified deep veins of unspecified lower extremity: Secondary | ICD-10-CM

## 2012-05-08 DIAGNOSIS — Z5181 Encounter for therapeutic drug level monitoring: Secondary | ICD-10-CM

## 2012-05-08 LAB — POCT INR: INR: 2.6

## 2012-05-09 ENCOUNTER — Other Ambulatory Visit: Payer: Self-pay | Admitting: Family Medicine

## 2012-05-09 NOTE — Telephone Encounter (Signed)
Ok to refill 

## 2012-05-09 NOTE — Telephone Encounter (Signed)
She can have 12 mo of refils, thanks 

## 2012-05-23 ENCOUNTER — Other Ambulatory Visit: Payer: Self-pay | Admitting: Family Medicine

## 2012-05-29 ENCOUNTER — Ambulatory Visit (INDEPENDENT_AMBULATORY_CARE_PROVIDER_SITE_OTHER): Payer: Medicare Other | Admitting: General Practice

## 2012-05-29 DIAGNOSIS — I82409 Acute embolism and thrombosis of unspecified deep veins of unspecified lower extremity: Secondary | ICD-10-CM

## 2012-05-29 DIAGNOSIS — Z5181 Encounter for therapeutic drug level monitoring: Secondary | ICD-10-CM

## 2012-05-29 DIAGNOSIS — Z7901 Long term (current) use of anticoagulants: Secondary | ICD-10-CM

## 2012-06-26 ENCOUNTER — Ambulatory Visit (INDEPENDENT_AMBULATORY_CARE_PROVIDER_SITE_OTHER): Payer: Medicare Other | Admitting: General Practice

## 2012-06-26 DIAGNOSIS — Z7901 Long term (current) use of anticoagulants: Secondary | ICD-10-CM

## 2012-06-26 DIAGNOSIS — I82409 Acute embolism and thrombosis of unspecified deep veins of unspecified lower extremity: Secondary | ICD-10-CM

## 2012-06-26 DIAGNOSIS — Z5181 Encounter for therapeutic drug level monitoring: Secondary | ICD-10-CM

## 2012-06-26 LAB — POCT INR: INR: 3

## 2012-07-14 ENCOUNTER — Other Ambulatory Visit: Payer: Self-pay | Admitting: *Deleted

## 2012-07-14 MED ORDER — WARFARIN SODIUM 1 MG PO TABS: 1.0000 mg | ORAL_TABLET | ORAL | Status: DC

## 2012-07-24 ENCOUNTER — Ambulatory Visit (INDEPENDENT_AMBULATORY_CARE_PROVIDER_SITE_OTHER): Payer: Medicare Other | Admitting: General Practice

## 2012-07-24 DIAGNOSIS — Z5181 Encounter for therapeutic drug level monitoring: Secondary | ICD-10-CM

## 2012-07-24 DIAGNOSIS — I82409 Acute embolism and thrombosis of unspecified deep veins of unspecified lower extremity: Secondary | ICD-10-CM

## 2012-07-24 DIAGNOSIS — Z7901 Long term (current) use of anticoagulants: Secondary | ICD-10-CM

## 2012-07-24 LAB — POCT INR: INR: 2.9

## 2012-08-21 ENCOUNTER — Ambulatory Visit (INDEPENDENT_AMBULATORY_CARE_PROVIDER_SITE_OTHER): Payer: Medicare Other | Admitting: General Practice

## 2012-08-21 DIAGNOSIS — I82409 Acute embolism and thrombosis of unspecified deep veins of unspecified lower extremity: Secondary | ICD-10-CM

## 2012-08-21 DIAGNOSIS — Z5181 Encounter for therapeutic drug level monitoring: Secondary | ICD-10-CM

## 2012-08-21 DIAGNOSIS — Z7901 Long term (current) use of anticoagulants: Secondary | ICD-10-CM

## 2012-08-21 LAB — POCT INR: INR: 2.8

## 2012-09-25 ENCOUNTER — Ambulatory Visit (INDEPENDENT_AMBULATORY_CARE_PROVIDER_SITE_OTHER): Payer: Medicare Other | Admitting: General Practice

## 2012-09-25 DIAGNOSIS — Z7901 Long term (current) use of anticoagulants: Secondary | ICD-10-CM

## 2012-09-25 DIAGNOSIS — Z5181 Encounter for therapeutic drug level monitoring: Secondary | ICD-10-CM

## 2012-09-25 DIAGNOSIS — I82409 Acute embolism and thrombosis of unspecified deep veins of unspecified lower extremity: Secondary | ICD-10-CM

## 2012-09-25 LAB — POCT INR: INR: 2.2

## 2012-10-10 ENCOUNTER — Encounter: Payer: Self-pay | Admitting: Family Medicine

## 2012-10-10 ENCOUNTER — Ambulatory Visit (INDEPENDENT_AMBULATORY_CARE_PROVIDER_SITE_OTHER): Payer: Medicare Other | Admitting: Family Medicine

## 2012-10-10 VITALS — BP 134/72 | HR 78 | Temp 99.0°F | Ht 65.0 in | Wt 143.0 lb

## 2012-10-10 DIAGNOSIS — E785 Hyperlipidemia, unspecified: Secondary | ICD-10-CM

## 2012-10-10 DIAGNOSIS — E875 Hyperkalemia: Secondary | ICD-10-CM

## 2012-10-10 DIAGNOSIS — Z9181 History of falling: Secondary | ICD-10-CM

## 2012-10-10 DIAGNOSIS — I10 Essential (primary) hypertension: Secondary | ICD-10-CM

## 2012-10-10 DIAGNOSIS — N189 Chronic kidney disease, unspecified: Secondary | ICD-10-CM

## 2012-10-10 LAB — COMPREHENSIVE METABOLIC PANEL
AST: 26 U/L (ref 0–37)
Alkaline Phosphatase: 90 U/L (ref 39–117)
BUN: 31 mg/dL — ABNORMAL HIGH (ref 6–23)
Creatinine, Ser: 1.8 mg/dL — ABNORMAL HIGH (ref 0.4–1.2)
Glucose, Bld: 98 mg/dL (ref 70–99)

## 2012-10-10 LAB — CBC WITH DIFFERENTIAL/PLATELET
Basophils Relative: 0.9 % (ref 0.0–3.0)
Eosinophils Absolute: 0.2 10*3/uL (ref 0.0–0.7)
Eosinophils Relative: 4.9 % (ref 0.0–5.0)
HCT: 37.9 % (ref 36.0–46.0)
Hemoglobin: 12.7 g/dL (ref 12.0–15.0)
Lymphs Abs: 1.9 10*3/uL (ref 0.7–4.0)
MCHC: 33.6 g/dL (ref 30.0–36.0)
MCV: 87.3 fl (ref 78.0–100.0)
Monocytes Absolute: 0.6 10*3/uL (ref 0.1–1.0)
Neutro Abs: 1.6 10*3/uL (ref 1.4–7.7)
RBC: 4.34 Mil/uL (ref 3.87–5.11)

## 2012-10-10 LAB — LIPID PANEL
Cholesterol: 165 mg/dL (ref 0–200)
LDL Cholesterol: 111 mg/dL — ABNORMAL HIGH (ref 0–99)
Triglycerides: 122 mg/dL (ref 0.0–149.0)
VLDL: 24.4 mg/dL (ref 0.0–40.0)

## 2012-10-10 LAB — TSH: TSH: 1.89 u[IU]/mL (ref 0.35–5.50)

## 2012-10-10 NOTE — Assessment & Plan Note (Signed)
Pt uses walker[place] High fall risk -and on coumadin Ref to PT for gait training

## 2012-10-10 NOTE — Assessment & Plan Note (Signed)
bp in fair control at this time  No changes needed  Disc lifstyle change with low sodium diet and exercise  Lab today 

## 2012-10-10 NOTE — Assessment & Plan Note (Addendum)
Lipids today  Eating well  On simvastatin and diet

## 2012-10-10 NOTE — Assessment & Plan Note (Signed)
Lab today No cramping or other symptoms

## 2012-10-10 NOTE — Progress Notes (Signed)
Subjective:    Patient ID: Tracy Morales, female    DOB: 02/05/1920, 79 y.o.   MRN: [id number]  HPI Here for f/u of chronic health problems   Wt is stable with bmi of 23 Appetite- is good  Also sleeps pretty well  Mood - is ok for the most part - overall   Here with her daughter - from [place]    bp is stable today  No cp or palpitations or headaches or edema  No side effects to medicines  BP Readings from Last 3 Encounters:  10/10/12 134/72  04/11/12 146/68  02/25/12 130/66      Visiting nurse recommended opthy exam   Also wheelchair to prevent falls  Lipids- due for a check  Lab Results  Component Value Date   CHOL 143 10/09/2011   HDL 33.80* 10/09/2011   LDLCALC 88 10/09/2011   TRIG 105.0 10/09/2011   CHOLHDL 4 10/09/2011    Sees renal for renal insuff Seen early in April - will - with 6 month follow up   Has been over a year since last eye exam  She thinks her vision is ok  No hx of glaucoma   Also suggested a wheel chair to prevent falls  She has a walker that she uses all the time  Has fallen in the past - always falls backwards -no injuries This is very worrisome in light of coumadin   Patient Active Problem List   Diagnosis Date Noted  . Risk for falls 04/11/2012  . Leg pain, bilateral 01/29/2012  . Heel pain 03/30/2011  . MICROSCOPIC HEMATURIA 08/29/2009  . CONDUCTIVE HEARING LOSS BILATERAL 08/08/2009  . DVT 04/12/2009  . EDEMA 04/07/2009  . HYPERPOTASSEMIA 01/25/2009  . BRADYCARDIA 10/26/2008  . DYSPNEA 10/26/2008  . INCONTINENCE, URGE 02/27/2008  . HYPERLIPIDEMIA 10/11/2006  . PERIPHERAL NEUROPATHY, LOWER EXTREMITIES, BILATERAL 10/11/2006  . HYPERTENSION 10/11/2006  . DIASTOLIC DYSFUNCTION 10/11/2006  . CAROTID STENOSIS 10/11/2006  . CEREBROVASCULAR DISEASE 10/11/2006  . PERIPHERAL VASCULAR DISEASE 10/11/2006  . ALLERGIC RHINITIS 10/11/2006  . RENAL INSUFFICIENCY, CHRONIC 10/11/2006  . OVERACTIVE BLADDER 10/11/2006  . OSTEOARTHRITIS 10/11/2006  .  POLYMYALGIA RHEUMATICA, HX OF 10/11/2006  . CARDIAC MURMUR, HX OF 10/11/2006   Past Medical History  Diagnosis Date  . Dyspnea     echo with normal EF. +diastolic dysfunction  . Occlusion and stenosis of carotid artery without mention of cerebral infarction 4/09    u/s L 40-59%, R 0-39%; stable for many years  . Peripheral vascular disease, unspecified   . Bradycardia   . HTN (hypertension)   . Hyperlipidemia   . RUQ pain   . Urge incontinence   . Chronic kidney disease, unspecified     chronic renal insufficiency  . Mononeuritis of lower limb, unspecified     peripheral neuropathy, bilateral  . Cerebrovascular disease, unspecified   . Osteoarthrosis, unspecified whether generalized or localized, unspecified site   . Dizziness and giddiness   . Allergic rhinitis, cause unspecified   . Polymyalgia rheumatica     hx  . Hypertonicity of bladder     hx  . DVT (deep venous thrombosis)     R  . 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 Mild depression    Past Surgical History  Procedure Laterality Date  . Cataract extraction    . Cholecystectomy  2000  . Carotid doppler  4/02    bilateral plaque   . Echo  mild LVH EF 50% As  . Arterial doppler    . Vert. basilar insufficiency  2002  . Dexa  2/01    osteopenia  . Carotid doppler  8/06    non significant stenosis  . Zoster  5/02  . Colonoscopy  1/04    polyps  . Triger finger contracture  6/04  . 2d echo  8/06    mild aortic stenosis.   . Carotid doppler      R 40%; L 40-60%  . Ct scan  05/25/08    abd and pelvis- inflated urinary bladder and large amt stool throughout w/o blockage    History  Substance Use Topics  . Smoking status: Never Smoker   . Smokeless tobacco: Never Used     Comment: non smoker   . Alcohol Use: No   Family History  Problem Relation Age of Onset  . Hypertension Father   . Other Father     Cardiac problems  . Heart disease Father   . Other Brother     blood clot  .  Other Sister     benign breast nodule   Allergies  Allergen Reactions  . Ace Inhibitors     REACTION: cough   Current Outpatient Prescriptions on File Prior to Visit  Medication Sig Dispense Refill  . acetaminophen (TYLENOL) 500 MG tablet Take 500 mg by mouth every 6 (six) hours as needed. OTC-UAD       . amLODipine (NORVASC) 5 MG tablet Take 5 mg by mouth daily.       Marland Kitchen aspirin 81 MG EC tablet Take 81 mg by mouth daily.        . [employer] IRON 325 (65 FE) MG tablet TAKE 1 TABLET BY MOUTH 2 TIMES A DAY  60 tablet  4  . Meclizine HCl (BONINE PO) Take by mouth as needed.      . mirtazapine (REMERON) 30 MG tablet Take 1 tablet (30 mg total) by mouth at bedtime.  30 tablet  11  . NON FORMULARY Stool softener (unsure of name). OTC-UAD       . NON FORMULARY Walker with wheels - for use with ambulation once daily. 429.9, 715.90, 355.9       . polyethylene glycol powder (MIRALAX) powder Take 17 g by mouth daily. OTC  UAD       . simvastatin (ZOCOR) 20 MG tablet TAKE 1 TABLET (20 MG TOTAL) BY MOUTH DAILY.  30 tablet  11  . warfarin (COUMADIN) 1 MG tablet Take 1 tablet (1 mg total) by mouth as directed.  90 tablet  1  . warfarin (COUMADIN) 5 MG tablet TAKE 1 TABLET (5 MG TOTAL) BY MOUTH DAILY.  30 tablet  0  . warfarin (COUMADIN) 5 MG tablet Take as directed  30 tablet  6   No current facility-administered medications on file prior to visit.     Review of Systems Review of Systems  Constitutional: Negative for fever, appetite change, fatigue and unexpected weight change.  Eyes: Negative for pain and visual disturbance.  Respiratory: Negative for cough and shortness of breath.   Cardiovascular: Negative for cp or palpitations    Gastrointestinal: Negative for nausea, diarrhea and constipation.  Genitourinary: Negative for urgency and frequency.  Skin: Negative for pallor or rash   Neurological: Negative for weakness, light-headedness, numbness and headaches. pos for generally poor balance   Hematological: Negative for adenopathy. Does not bruise/bleed easily.  Psychiatric/Behavioral: Negative for dysphoric mood. The patient is  not nervous/anxious.         Objective:   Physical Exam  Constitutional: She appears well-developed and well-nourished. No distress.  Frail appearing (but well) elderly female  HENT:  Head: Normocephalic and atraumatic.  Eyes: Conjunctivae and EOM are normal. Pupils are equal, round, and reactive to light. Right eye exhibits no discharge. Left eye exhibits no discharge. No scleral icterus.  Neck: Normal range of motion. Neck supple. No JVD present. Carotid bruit is not present. No thyromegaly present.  Cardiovascular: Normal rate, regular rhythm, normal heart sounds and intact distal pulses.  Exam reveals no gallop.   Pulmonary/Chest: Effort normal and breath sounds normal. No respiratory distress. She has no wheezes.  Abdominal: Soft. Bowel sounds are normal. She exhibits no distension, no abdominal bruit and no mass. There is no tenderness.  Musculoskeletal: She exhibits no edema and no tenderness.  Kyphosis noted   Lymphadenopathy:    She has no cervical adenopathy.  Neurological: She is alert. She has normal reflexes. No cranial nerve deficit. She exhibits normal muscle tone.  Gait is wide based, slow and steady with walker  Skin: Skin is warm and dry. No rash noted. No erythema. No pallor.  Psychiatric: She has a normal mood and affect.  Nl affect            Assessment & Plan:

## 2012-10-10 NOTE — Assessment & Plan Note (Signed)
Sees Dr Thedore Mins- [facility] today and will send result

## 2012-10-10 NOTE — Patient Instructions (Addendum)
Labs today  See Shirlee Limerick on the way out to discuss physical therapy referral to prevent falls  Use walker all the time - [place] scooter [place] the shopping center  Take care of yourself

## 2012-10-13 ENCOUNTER — Telehealth: Payer: Self-pay | Admitting: Family Medicine

## 2012-10-13 NOTE — Telephone Encounter (Signed)
Caller: Eugenia/Child; Phone: [phone]; Reason for Call: Needs to speak to [facility] as was to schedule appointment for physical therapy.  PLEASE CALL AND ADVISE

## 2012-10-14 ENCOUNTER — Encounter: Payer: Self-pay | Admitting: *Deleted

## 2012-10-23 ENCOUNTER — Ambulatory Visit (INDEPENDENT_AMBULATORY_CARE_PROVIDER_SITE_OTHER): Payer: Medicare Other | Admitting: General Practice

## 2012-10-23 DIAGNOSIS — Z7901 Long term (current) use of anticoagulants: Secondary | ICD-10-CM

## 2012-10-23 DIAGNOSIS — I82409 Acute embolism and thrombosis of unspecified deep veins of unspecified lower extremity: Secondary | ICD-10-CM

## 2012-10-23 DIAGNOSIS — Z5181 Encounter for therapeutic drug level monitoring: Secondary | ICD-10-CM

## 2012-10-24 ENCOUNTER — Encounter: Payer: Self-pay | Admitting: Family Medicine

## 2012-11-02 ENCOUNTER — Encounter: Payer: Self-pay | Admitting: Family Medicine

## 2012-11-20 ENCOUNTER — Ambulatory Visit (INDEPENDENT_AMBULATORY_CARE_PROVIDER_SITE_OTHER): Payer: Medicare Other | Admitting: Family Medicine

## 2012-11-20 ENCOUNTER — Ambulatory Visit: Payer: Medicare Other

## 2012-11-20 DIAGNOSIS — Z7901 Long term (current) use of anticoagulants: Secondary | ICD-10-CM

## 2012-11-20 DIAGNOSIS — Z5181 Encounter for therapeutic drug level monitoring: Secondary | ICD-10-CM

## 2012-11-20 DIAGNOSIS — I82409 Acute embolism and thrombosis of unspecified deep veins of unspecified lower extremity: Secondary | ICD-10-CM

## 2012-12-15 ENCOUNTER — Other Ambulatory Visit: Payer: Self-pay | Admitting: Family Medicine

## 2012-12-18 ENCOUNTER — Ambulatory Visit (INDEPENDENT_AMBULATORY_CARE_PROVIDER_SITE_OTHER): Payer: Medicare Other | Admitting: Family Medicine

## 2012-12-18 DIAGNOSIS — Z5181 Encounter for therapeutic drug level monitoring: Secondary | ICD-10-CM

## 2012-12-18 DIAGNOSIS — Z7901 Long term (current) use of anticoagulants: Secondary | ICD-10-CM

## 2012-12-18 DIAGNOSIS — I82409 Acute embolism and thrombosis of unspecified deep veins of unspecified lower extremity: Secondary | ICD-10-CM

## 2012-12-18 LAB — POCT INR: INR: 3.4

## 2013-01-01 ENCOUNTER — Other Ambulatory Visit: Payer: Self-pay | Admitting: Family Medicine

## 2013-01-29 ENCOUNTER — Ambulatory Visit (INDEPENDENT_AMBULATORY_CARE_PROVIDER_SITE_OTHER): Payer: Medicare Other | Admitting: Family Medicine

## 2013-01-29 DIAGNOSIS — Z7901 Long term (current) use of anticoagulants: Secondary | ICD-10-CM

## 2013-01-29 DIAGNOSIS — Z5181 Encounter for therapeutic drug level monitoring: Secondary | ICD-10-CM

## 2013-01-29 DIAGNOSIS — I82409 Acute embolism and thrombosis of unspecified deep veins of unspecified lower extremity: Secondary | ICD-10-CM

## 2013-02-25 ENCOUNTER — Ambulatory Visit (INDEPENDENT_AMBULATORY_CARE_PROVIDER_SITE_OTHER): Payer: Medicare Other

## 2013-02-25 DIAGNOSIS — Z23 Encounter for immunization: Secondary | ICD-10-CM

## 2013-03-12 ENCOUNTER — Ambulatory Visit (INDEPENDENT_AMBULATORY_CARE_PROVIDER_SITE_OTHER): Payer: Medicare Other | Admitting: Family Medicine

## 2013-03-12 DIAGNOSIS — Z7901 Long term (current) use of anticoagulants: Secondary | ICD-10-CM

## 2013-03-12 DIAGNOSIS — Z5181 Encounter for therapeutic drug level monitoring: Secondary | ICD-10-CM

## 2013-03-12 DIAGNOSIS — I82409 Acute embolism and thrombosis of unspecified deep veins of unspecified lower extremity: Secondary | ICD-10-CM

## 2013-04-14 ENCOUNTER — Ambulatory Visit (INDEPENDENT_AMBULATORY_CARE_PROVIDER_SITE_OTHER): Payer: Medicare Other | Admitting: Family Medicine

## 2013-04-14 ENCOUNTER — Encounter: Payer: Self-pay | Admitting: Family Medicine

## 2013-04-14 ENCOUNTER — Ambulatory Visit (INDEPENDENT_AMBULATORY_CARE_PROVIDER_SITE_OTHER)
Admission: RE | Admit: 2013-04-14 | Discharge: 2013-04-14 | Disposition: A | Discharge status: Home / Self Care | Payer: Medicare Other | Source: Ambulatory Visit | Attending: Family Medicine | Admitting: Family Medicine

## 2013-04-14 VITALS — BP 142/62 | HR 65 | Temp 97.6°F | Wt 138.2 lb

## 2013-04-14 DIAGNOSIS — M25579 Pain in unspecified ankle and joints of unspecified foot: Secondary | ICD-10-CM

## 2013-04-14 DIAGNOSIS — M25571 Pain in right ankle and joints of right foot: Secondary | ICD-10-CM

## 2013-04-14 NOTE — Progress Notes (Signed)
Pre-visit discussion using our clinic review tool. No additional management support is needed unless otherwise documented below in the visit note.  No recent med changes.  R foot pain.  Started w/o trigger.  Started a few days ago.  Initially with1st toe pain near the bunion, improved in the meantime.  Then had dorsal swelling.  Then the pain on the dorsum of the foot.   Pain with the sheet laying across the foot at night.  Less pain during the day, more at night.  Not as much pain walking on the foot. Her foot didn't get red or hot.  No L foot sx. No R ankle pain.  Able to bear weight.    She has been soaking her foot, with some relief.   Tylenol last night helped.   Meds, vitals, and allergies reviewed.   ROS: See HPI.  Otherwise, noncontributory.  nad R foot with normal inspection except for minimal dorsal swelling and the chronic bunion noted.  Distal 2nd/3rd MT sore dorsally but no fluctuant mass.   Xray w/o acute process, self read.

## 2013-04-14 NOTE — Patient Instructions (Signed)
Take tylenol as needed for pain and we'll notify you with the xray report.  Take care. Glad to see you.

## 2013-04-15 DIAGNOSIS — M25579 Pain in unspecified ankle and joints of unspecified foot: Secondary | ICD-10-CM | POA: Insufficient documentation

## 2013-04-15 NOTE — Assessment & Plan Note (Signed)
No fx seen, less likely to be gout with less pain during the day.  Could be OA flare. Would continue prn tylenol and f/u prn.  D/w pt.  She agrees.

## 2013-04-23 ENCOUNTER — Ambulatory Visit: Payer: Medicare Other

## 2013-04-27 ENCOUNTER — Ambulatory Visit (INDEPENDENT_AMBULATORY_CARE_PROVIDER_SITE_OTHER): Payer: Medicare Other | Admitting: Family Medicine

## 2013-04-27 ENCOUNTER — Encounter: Payer: Self-pay | Admitting: Family Medicine

## 2013-04-27 VITALS — BP 124/68 | HR 68 | Temp 97.4°F | Ht 65.0 in | Wt 136.5 lb

## 2013-04-27 DIAGNOSIS — Z7901 Long term (current) use of anticoagulants: Secondary | ICD-10-CM

## 2013-04-27 DIAGNOSIS — E785 Hyperlipidemia, unspecified: Secondary | ICD-10-CM

## 2013-04-27 DIAGNOSIS — I129 Hypertensive chronic kidney disease with stage 1 through stage 4 chronic kidney disease, or unspecified chronic kidney disease: Secondary | ICD-10-CM

## 2013-04-27 DIAGNOSIS — I10 Essential (primary) hypertension: Secondary | ICD-10-CM

## 2013-04-27 DIAGNOSIS — I82409 Acute embolism and thrombosis of unspecified deep veins of unspecified lower extremity: Secondary | ICD-10-CM

## 2013-04-27 DIAGNOSIS — N189 Chronic kidney disease, unspecified: Secondary | ICD-10-CM

## 2013-04-27 DIAGNOSIS — Z5181 Encounter for therapeutic drug level monitoring: Secondary | ICD-10-CM

## 2013-04-27 DIAGNOSIS — Z9181 History of falling: Secondary | ICD-10-CM

## 2013-04-27 DIAGNOSIS — W19XXXA Unspecified fall, initial encounter: Secondary | ICD-10-CM

## 2013-04-27 LAB — POCT INR: INR: 3.1

## 2013-04-27 NOTE — Assessment & Plan Note (Signed)
Reviewed last lipid adn statin and diet

## 2013-04-27 NOTE — Assessment & Plan Note (Signed)
Pt continues f/u with Dr Lasandra Beech to keep up good water intake

## 2013-04-27 NOTE — Progress Notes (Signed)
Subjective:    Patient ID: Tracy Morales, female    DOB: 20-Oct-1919, 79 y.o.   MRN: [id number]  HPI  Here for f/u of chronic health problems  Wt is down 2 lb  Appetite has dec with age - she does not "eat a whole lot" Will forget to eat when she is quilting or busy  Was recently in for foot pain - is doing better now - xray normal    bp is stable today  No cp or palpitations or headaches or edema  No side effects to medicines  BP Readings from Last 3 Encounters:  04/27/13 124/68  04/14/13 142/62  10/10/12 134/72      Hyperlipidemia Simvastatin and diet Lab Results  Component Value Date   CHOL 165 10/10/2012   HDL 30.10* 10/10/2012   LDLCALC 111* 10/10/2012   TRIG 122.0 10/10/2012   CHOLHDL 5 10/10/2012     Sees Dr Thedore Mins for renal insuff  Thinks she saw him this fall- thinks things are stable   She did have a fall last sat  Went to turn in the kitchen and fell backwards - fell on her buttocks  She was not using her walker -"not enough room in her kitchen" Caregiver debates this -states her walker will fit through her whole house-she just does not like to use it   Patient Active Problem List   Diagnosis Date Noted  . Fall 04/27/2013  . Pain in joint, ankle and foot 04/15/2013  . Risk for falls 04/11/2012  . Leg pain, bilateral 01/29/2012  . Heel pain 03/30/2011  . MICROSCOPIC HEMATURIA 08/29/2009  . CONDUCTIVE HEARING LOSS BILATERAL 08/08/2009  . DVT 04/12/2009  . EDEMA 04/07/2009  . HYPERPOTASSEMIA 01/25/2009  . BRADYCARDIA 10/26/2008  . DYSPNEA 10/26/2008  . INCONTINENCE, URGE 02/27/2008  . HYPERLIPIDEMIA 10/11/2006  . PERIPHERAL NEUROPATHY, LOWER EXTREMITIES, BILATERAL 10/11/2006  . HYPERTENSION 10/11/2006  . DIASTOLIC DYSFUNCTION 10/11/2006  . CAROTID STENOSIS 10/11/2006  . CEREBROVASCULAR DISEASE 10/11/2006  . PERIPHERAL VASCULAR DISEASE 10/11/2006  . ALLERGIC RHINITIS 10/11/2006  . RENAL INSUFFICIENCY, CHRONIC 10/11/2006  . OVERACTIVE BLADDER 10/11/2006   . OSTEOARTHRITIS 10/11/2006  . POLYMYALGIA RHEUMATICA, HX OF 10/11/2006  . CARDIAC MURMUR, HX OF 10/11/2006   Past Medical History  Diagnosis Date  . Dyspnea     echo with normal EF. +diastolic dysfunction  . Occlusion and stenosis of carotid artery without mention of cerebral infarction 4/09    u/s L 40-59%, R 0-39%; stable for many years  . Peripheral vascular disease, unspecified   . Bradycardia   . HTN (hypertension)   . Hyperlipidemia   . RUQ pain   . Urge incontinence   . Chronic kidney disease, unspecified     chronic renal insufficiency  . Mononeuritis of lower limb, unspecified     peripheral neuropathy, bilateral  . Cerebrovascular disease, unspecified   . Osteoarthrosis, unspecified whether generalized or localized, unspecified site   . Dizziness and giddiness   . Allergic rhinitis, cause unspecified   . Polymyalgia rheumatica     hx  . Hypertonicity of bladder     hx  . DVT (deep venous thrombosis)     R  . 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 Mild depression    Past Surgical History  Procedure Laterality Date  . Cataract extraction    . Cholecystectomy  2000  . Carotid doppler  4/02    bilateral plaque   . Echo  mild LVH EF 50% As  . Arterial doppler    . Vert. basilar insufficiency  2002  . Dexa  2/01    osteopenia  . Carotid doppler  8/06    non significant stenosis  . Zoster  5/02  . Colonoscopy  1/04    polyps  . Triger finger contracture  6/04  . 2d echo  8/06    mild aortic stenosis.   . Carotid doppler      R 40%; L 40-60%  . Ct scan  05/25/08    abd and pelvis- inflated urinary bladder and large amt stool throughout w/o blockage    History  Substance Use Topics  . Smoking status: Never Smoker   . Smokeless tobacco: Never Used     Comment: non smoker   . Alcohol Use: No   Family History  Problem Relation Age of Onset  . Hypertension Father   . Other Father     Cardiac problems  . Heart disease Father   .  Other Brother     blood clot  . Other Sister     benign breast nodule   Allergies  Allergen Reactions  . Ace Inhibitors     REACTION: cough   Current Outpatient Prescriptions on File Prior to Visit  Medication Sig Dispense Refill  . acetaminophen (TYLENOL) 500 MG tablet Take 500 mg by mouth every 6 (six) hours as needed. OTC-UAD       . amLODipine (NORVASC) 5 MG tablet Take 5 mg by mouth daily.       Marland Kitchen aspirin 81 MG EC tablet Take 81 mg by mouth daily.        . ferrous sulfate 325 (65 FE) MG tablet TAKE 1 TABLET BY MOUTH 2 TIMES A DAY  60 tablet  4  . Meclizine HCl (BONINE PO) Take by mouth as needed.      . mirtazapine (REMERON) 30 MG tablet Take 1 tablet (30 mg total) by mouth at bedtime.  30 tablet  11  . NON FORMULARY Stool softener (unsure of name). OTC-UAD       . NON FORMULARY Walker with wheels - for use with ambulation once daily. 429.9, 715.90, 355.9       . polyethylene glycol powder (MIRALAX) powder Take 17 g by mouth daily. OTC  UAD       . simvastatin (ZOCOR) 20 MG tablet TAKE 1 TABLET (20 MG TOTAL) BY MOUTH DAILY.  30 tablet  5  . warfarin (COUMADIN) 1 MG tablet Take 1 tablet (1 mg total) by mouth as directed.  90 tablet  1  . warfarin (COUMADIN) 5 MG tablet TAKE 1 TABLET (5 MG TOTAL) BY MOUTH DAILY.  30 tablet  0   No current facility-administered medications on file prior to visit.    Review of Systems Review of Systems  Constitutional: Negative for fever, appetite change, fatigue and unexpected weight change.  Eyes: Negative for pain and visual disturbance.  Respiratory: Negative for cough and shortness of breath.   Cardiovascular: Negative for cp or palpitations    Gastrointestinal: Negative for nausea, diarrhea and constipation.  Genitourinary: Negative for urgency and frequency.  Skin: Negative for pallor or rash   MSK pos for soreness on buttocks after fall  Neurological: Negative for weakness, light-headedness, numbness and headaches.  Hematological:  Negative for adenopathy. Does not bruise/bleed easily.  Psychiatric/Behavioral: Negative for dysphoric mood. The patient is not nervous/anxious.         Objective:  Physical Exam  Constitutional: She appears well-developed and well-nourished. No distress.  HENT:  Head: Normocephalic and atraumatic.  Right Ear: External ear normal.  Left Ear: External ear normal.  Nose: Nose normal.  Mouth/Throat: Oropharynx is clear and moist.  Eyes: Conjunctivae and EOM are normal. Pupils are equal, round, and reactive to light. Right eye exhibits no discharge. Left eye exhibits no discharge. No scleral icterus.  Neck: Normal range of motion. Neck supple. No JVD present. Carotid bruit is present. No thyromegaly present.  Cardiovascular: Normal rate, regular rhythm, normal heart sounds and intact distal pulses.  Exam reveals no gallop.   Pulmonary/Chest: Effort normal and breath sounds normal. No respiratory distress. She has no wheezes. She has no rales.  Abdominal: Soft. Bowel sounds are normal. She exhibits no distension, no abdominal bruit and no mass. There is no tenderness.  Musculoskeletal: She exhibits no edema and no tenderness.  No tenderness over LS/ TS or joints No pelvic tenderness   Mild R sided gluteal tenderness  No bruising or swelling   Nl gait with walker Bearing wt on both legs    Lymphadenopathy:    She has no cervical adenopathy.  Neurological: She is alert. She has normal reflexes. No cranial nerve deficit. She exhibits normal muscle tone. Coordination normal.  Skin: Skin is warm and dry. No rash noted. No erythema. No pallor.  Psychiatric: She has a normal mood and affect.  Nl affect/ mentally sharp          Assessment & Plan:

## 2013-04-27 NOTE — Assessment & Plan Note (Signed)
Disc circumstance with fall  No major injuries fortunately- but disc risks of falls esp on coumadin  Enc to use walker at all times - and caregiver assured they would work on that  She has had PT for balance in the past

## 2013-04-27 NOTE — Assessment & Plan Note (Signed)
Re emphisized imp of use of walker at ALL TIMES now-and pt voices understanding Understands risks of falls with coumadin

## 2013-04-27 NOTE — Patient Instructions (Signed)
You can buy your iron pill (ferrous sulfate ) over the counter - 325 mg - 2 daily -- ask the pharmacist [place]  Keep eating regular meals even if they are small to keep from loosing weight (ensure is fine as well) Blood pressure is good  Always always have your walker with you from now on to prevent falls Use ice on sore area if you needed  No change in medicines  Follow up in 6 months for annual exam with labs prior (or that day if preferred)

## 2013-04-27 NOTE — Progress Notes (Signed)
Pre-visit discussion using our clinic review tool. No additional management support is needed unless otherwise documented below in the visit note.  

## 2013-04-27 NOTE — Assessment & Plan Note (Signed)
BP: 124/68 mmHg   bp in fair control at this time  No changes needed Disc lifstyle change with low sodium diet and activity   Rev last lab

## 2013-05-31 ENCOUNTER — Other Ambulatory Visit: Payer: Self-pay | Admitting: Family Medicine

## 2013-06-01 NOTE — Telephone Encounter (Signed)
Rout to [facility] 

## 2013-06-08 ENCOUNTER — Ambulatory Visit (INDEPENDENT_AMBULATORY_CARE_PROVIDER_SITE_OTHER): Payer: Medicare HMO | Admitting: Family Medicine

## 2013-06-08 DIAGNOSIS — Z5181 Encounter for therapeutic drug level monitoring: Secondary | ICD-10-CM

## 2013-06-08 DIAGNOSIS — Z7901 Long term (current) use of anticoagulants: Secondary | ICD-10-CM

## 2013-06-08 LAB — POCT INR: INR: 2.7

## 2013-06-17 ENCOUNTER — Other Ambulatory Visit: Payer: Self-pay | Admitting: Family Medicine

## 2013-06-18 NOTE — Telephone Encounter (Signed)
Electronic refill request, please advise  

## 2013-06-18 NOTE — Telephone Encounter (Signed)
Please refill for a year, thanks 

## 2013-06-23 ENCOUNTER — Ambulatory Visit (INDEPENDENT_AMBULATORY_CARE_PROVIDER_SITE_OTHER): Payer: Medicare HMO | Admitting: Family Medicine

## 2013-06-23 ENCOUNTER — Encounter: Payer: Self-pay | Admitting: Family Medicine

## 2013-06-23 VITALS — BP 136/72 | HR 72 | Temp 97.4°F | Ht 65.0 in | Wt 134.5 lb

## 2013-06-23 DIAGNOSIS — S0990XA Unspecified injury of head, initial encounter: Secondary | ICD-10-CM | POA: Insufficient documentation

## 2013-06-23 DIAGNOSIS — R519 Headache, unspecified: Secondary | ICD-10-CM | POA: Insufficient documentation

## 2013-06-23 DIAGNOSIS — R51 Headache: Secondary | ICD-10-CM

## 2013-06-23 DIAGNOSIS — W19XXXA Unspecified fall, initial encounter: Secondary | ICD-10-CM

## 2013-06-23 NOTE — Progress Notes (Signed)
Subjective:    Patient ID: Tracy Morales, female    DOB: 06-23-1919, 80 y.o.   MRN: [id number]  HPI Here with a funny feeling in her head  No pain  Not dizzy  It feels heavy-like there is pressure in it   2 weeks ago - she fell (was dizzy and "had nothing to hold on to")- no time to sit down  Her chair may have slipped away from her ? (she is not really sure) Larey Seat on buttocks and her head hit the door -no loss of consciousness  ? How hard her head hit the door   Lives with grandson - but he is out most of the time   No vision change  No nausea  No headache  Acting normally- no change in personality  Balance worsens with age Has a walker  Has done PT for balance    Patient Active Problem List   Diagnosis Date Noted  . Fall 04/27/2013  . Pain in joint, ankle and foot 04/15/2013  . Risk for falls 04/11/2012  . Leg pain, bilateral 01/29/2012  . Heel pain 03/30/2011  . MICROSCOPIC HEMATURIA 08/29/2009  . CONDUCTIVE HEARING LOSS BILATERAL 08/08/2009  . DVT 04/12/2009  . EDEMA 04/07/2009  . HYPERPOTASSEMIA 01/25/2009  . BRADYCARDIA 10/26/2008  . DYSPNEA 10/26/2008  . INCONTINENCE, URGE 02/27/2008  . HYPERLIPIDEMIA 10/11/2006  . PERIPHERAL NEUROPATHY, LOWER EXTREMITIES, BILATERAL 10/11/2006  . HYPERTENSION 10/11/2006  . DIASTOLIC DYSFUNCTION 10/11/2006  . CAROTID STENOSIS 10/11/2006  . CEREBROVASCULAR DISEASE 10/11/2006  . PERIPHERAL VASCULAR DISEASE 10/11/2006  . ALLERGIC RHINITIS 10/11/2006  . RENAL INSUFFICIENCY, CHRONIC 10/11/2006  . OVERACTIVE BLADDER 10/11/2006  . OSTEOARTHRITIS 10/11/2006  . POLYMYALGIA RHEUMATICA, HX OF 10/11/2006  . CARDIAC MURMUR, HX OF 10/11/2006   Past Medical History  Diagnosis Date  . Dyspnea     echo with normal EF. +diastolic dysfunction  . Occlusion and stenosis of carotid artery without mention of cerebral infarction 4/09    u/s L 40-59%, R 0-39%; stable for many years  . Peripheral vascular disease, unspecified   .  Bradycardia   . HTN (hypertension)   . Hyperlipidemia   . RUQ pain   . Urge incontinence   . Chronic kidney disease, unspecified     chronic renal insufficiency  . Mononeuritis of lower limb, unspecified     peripheral neuropathy, bilateral  . Cerebrovascular disease, unspecified   . Osteoarthrosis, unspecified whether generalized or localized, unspecified site   . Dizziness and giddiness   . Allergic rhinitis, cause unspecified   . Polymyalgia rheumatica     hx  . Hypertonicity of bladder     hx  . DVT (deep venous thrombosis)     R  . 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 Mild depression    Past Surgical History  Procedure Laterality Date  . Cataract extraction    . Cholecystectomy  2000  . Carotid doppler  4/02    bilateral plaque   . Echo      mild LVH EF 50% As  . Arterial doppler    . Vert. basilar insufficiency  2002  . Dexa  2/01    osteopenia  . Carotid doppler  8/06    non significant stenosis  . Zoster  5/02  . Colonoscopy  1/04    polyps  . Triger finger contracture  6/04  . 2d echo  8/06    mild aortic stenosis.   . Carotid  doppler      R 40%; L 40-60%  . Ct scan  05/25/08    abd and pelvis- inflated urinary bladder and large amt stool throughout w/o blockage    History  Substance Use Topics  . Smoking status: Never Smoker   . Smokeless tobacco: Never Used     Comment: non smoker   . Alcohol Use: No   Family History  Problem Relation Age of Onset  . Hypertension Father   . Other Father     Cardiac problems  . Heart disease Father   . Other Brother     blood clot  . Other Sister     benign breast nodule   Allergies  Allergen Reactions  . Ace Inhibitors     REACTION: cough   Current Outpatient Prescriptions on File Prior to Visit  Medication Sig Dispense Refill  . acetaminophen (TYLENOL) 500 MG tablet Take 500 mg by mouth every 6 (six) hours as needed. OTC-UAD       . amLODipine (NORVASC) 5 MG tablet Take 5 mg by mouth  daily.       Marland Kitchen. aspirin 81 MG EC tablet Take 81 mg by mouth daily.        . ferrous sulfate 325 (65 FE) MG tablet TAKE 1 TABLET BY MOUTH 2 TIMES A DAY  60 tablet  4  . Meclizine HCl (BONINE PO) Take by mouth as needed.      . mirtazapine (REMERON) 30 MG tablet TAKE 1 TABLET (30 MG TOTAL) BY MOUTH AT BEDTIME.  30 tablet  11  . NON FORMULARY Stool softener (unsure of name). OTC-UAD       . NON FORMULARY Walker with wheels - for use with ambulation once daily. 429.9, 715.90, 355.9       . polyethylene glycol powder (MIRALAX) powder Take 17 g by mouth daily. OTC  UAD       . simvastatin (ZOCOR) 20 MG tablet TAKE 1 TABLET (20 MG TOTAL) BY MOUTH DAILY.  30 tablet  5  . warfarin (COUMADIN) 1 MG tablet Take 1 tablet (1 mg total) by mouth as directed.  90 tablet  1  . warfarin (COUMADIN) 5 MG tablet TAKE 1 TABLET (5 MG TOTAL) BY MOUTH DAILY.  30 tablet  0  . warfarin (COUMADIN) 5 MG tablet TAKE AS DIRECTED  30 tablet  2   No current facility-administered medications on file prior to visit.     Review of Systems Review of Systems  Constitutional: Negative for fever, appetite change, fatigue and unexpected weight change.  Eyes: Negative for pain and visual disturbance.  Respiratory: Negative for cough and shortness of breath.   Cardiovascular: Negative for cp or palpitations    Gastrointestinal: Negative for nausea, diarrhea and constipation.  Genitourinary: Negative for urgency and frequency.  Skin: Negative for pallor or rash   Neurological: Negative for weakness, light-headedness, numbness and pos for head discomfort  Neg for dizzines today but does have occas vertigo Hematological: Negative for adenopathy. Does not bruise/bleed easily.  Psychiatric/Behavioral: Negative for dysphoric mood. The patient is not nervous/anxious.         Objective:   Physical Exam  Constitutional: She is oriented to person, place, and time. She appears well-developed and well-nourished. No distress.  Frail  appearing elderly female   HENT:  Head: Normocephalic and atraumatic.  Right Ear: External ear normal.  Left Ear: External ear normal.  Mouth/Throat: Oropharynx is clear and moist.  Scant cerumen  No  visible head trauma or tenderness  Eyes: Conjunctivae and EOM are normal. Pupils are equal, round, and reactive to light. No scleral icterus.  Neck: Normal range of motion. Neck supple. No JVD present. Carotid bruit is not present. No thyromegaly present.  Cardiovascular: Normal rate, regular rhythm and intact distal pulses.  Exam reveals no gallop.   Pulmonary/Chest: Effort normal and breath sounds normal. No respiratory distress. She has no wheezes. She has no rales.  Abdominal: Soft. Bowel sounds are normal. She exhibits no distension, no abdominal bruit and no mass. There is no tenderness.  Musculoskeletal: She exhibits no edema and no tenderness.  Lymphadenopathy:    She has no cervical adenopathy.  Neurological: She is alert and oriented to person, place, and time. She has normal strength and normal reflexes. She displays no atrophy and no tremor. No cranial nerve deficit or sensory deficit. She exhibits normal muscle tone. Gait abnormal. Coordination normal.  Drifts back slt with rhomberg  Gait is slow with assistance- small steps / no foot drop  Somewhat wide based No bradykinesia  Skin: Skin is warm and dry. No rash noted. No erythema. No pallor.  Psychiatric: She has a normal mood and affect.  Nl mood and mentally sharp for her age           Assessment & Plan:

## 2013-06-23 NOTE — Progress Notes (Signed)
Pre-visit discussion using our clinic review tool. No additional management support is needed unless otherwise documented below in the visit note.  

## 2013-06-23 NOTE — Patient Instructions (Signed)
Stop up front to schedule cat scan If symptoms suddenly worsen- get to the ER Work hard on safety and fall prevention  Keep using your walker Have a family meeting about your safety at home given recent falls - I do wonder if an assisted living residence would be safer

## 2013-06-24 ENCOUNTER — Ambulatory Visit (INDEPENDENT_AMBULATORY_CARE_PROVIDER_SITE_OTHER)
Admission: RE | Admit: 2013-06-24 | Discharge: 2013-06-24 | Disposition: A | Discharge status: Home / Self Care | Payer: Medicare HMO | Source: Ambulatory Visit | Attending: Family Medicine | Admitting: Family Medicine

## 2013-06-24 ENCOUNTER — Ambulatory Visit: Payer: Medicare HMO | Admitting: Family Medicine

## 2013-06-24 DIAGNOSIS — R51 Headache: Secondary | ICD-10-CM

## 2013-06-24 DIAGNOSIS — S0990XA Unspecified injury of head, initial encounter: Secondary | ICD-10-CM

## 2013-06-24 NOTE — Assessment & Plan Note (Signed)
Recurrent despite walker and safety measures Disc need to move to facility with more help-family agrees -they will disc it  May need to stop coumadin if falls continue  For now she will be closely monitored

## 2013-06-24 NOTE — Assessment & Plan Note (Signed)
Ct to r/o subdural Hx of intermittent vertigo and ? If this adds to it  Also generally poor balance - again disc fall prev in detail

## 2013-06-24 NOTE — Assessment & Plan Note (Signed)
Episodes of head pressure with documented fall -hitting head Pt on coumadin-need to r/o subdural hematoma No neuro changes  Check head CT

## 2013-06-27 ENCOUNTER — Other Ambulatory Visit: Payer: Self-pay | Admitting: Family Medicine

## 2013-07-05 ENCOUNTER — Other Ambulatory Visit: Payer: Self-pay | Admitting: Family Medicine

## 2013-07-13 ENCOUNTER — Ambulatory Visit: Payer: Self-pay | Admitting: Podiatry

## 2013-07-20 ENCOUNTER — Ambulatory Visit (INDEPENDENT_AMBULATORY_CARE_PROVIDER_SITE_OTHER): Payer: Medicare HMO | Admitting: Family Medicine

## 2013-07-20 DIAGNOSIS — Z7901 Long term (current) use of anticoagulants: Secondary | ICD-10-CM

## 2013-07-20 DIAGNOSIS — I82409 Acute embolism and thrombosis of unspecified deep veins of unspecified lower extremity: Secondary | ICD-10-CM

## 2013-07-20 DIAGNOSIS — Z5181 Encounter for therapeutic drug level monitoring: Secondary | ICD-10-CM

## 2013-07-20 LAB — POCT INR: INR: 1.9

## 2013-07-27 ENCOUNTER — Ambulatory Visit (INDEPENDENT_AMBULATORY_CARE_PROVIDER_SITE_OTHER): Payer: Medicare HMO | Admitting: Podiatry

## 2013-07-27 VITALS — BP 147/84 | HR 76 | Resp 16 | Ht 65.0 in | Wt 130.0 lb

## 2013-07-27 DIAGNOSIS — M79609 Pain in unspecified limb: Secondary | ICD-10-CM

## 2013-07-27 DIAGNOSIS — B351 Tinea unguium: Secondary | ICD-10-CM

## 2013-07-27 NOTE — Progress Notes (Signed)
She presents today with a chief complaint of painful toenails bilateral.  Objective: Vital signs are stable she is alert and oriented x3. His pain on palpation of her thick yellow dystrophic clinically mycotic nails as well as debridement.  Assessment: Pain in limb secondary to onychomycosis bilateral.  Plan: Debridement of nails 1 through 5 bilateral is cover service secondary to pain.

## 2013-08-31 ENCOUNTER — Ambulatory Visit (INDEPENDENT_AMBULATORY_CARE_PROVIDER_SITE_OTHER): Payer: Medicare HMO | Admitting: Family Medicine

## 2013-08-31 DIAGNOSIS — Z5181 Encounter for therapeutic drug level monitoring: Secondary | ICD-10-CM

## 2013-08-31 DIAGNOSIS — Z7901 Long term (current) use of anticoagulants: Secondary | ICD-10-CM

## 2013-08-31 DIAGNOSIS — I82409 Acute embolism and thrombosis of unspecified deep veins of unspecified lower extremity: Secondary | ICD-10-CM

## 2013-08-31 LAB — POCT INR: INR: 2

## 2013-09-01 ENCOUNTER — Other Ambulatory Visit: Payer: Self-pay | Admitting: Family Medicine

## 2013-10-12 ENCOUNTER — Ambulatory Visit (INDEPENDENT_AMBULATORY_CARE_PROVIDER_SITE_OTHER): Payer: Medicare HMO | Admitting: Family Medicine

## 2013-10-12 DIAGNOSIS — I82409 Acute embolism and thrombosis of unspecified deep veins of unspecified lower extremity: Secondary | ICD-10-CM

## 2013-10-12 DIAGNOSIS — Z5181 Encounter for therapeutic drug level monitoring: Secondary | ICD-10-CM

## 2013-10-12 DIAGNOSIS — Z7901 Long term (current) use of anticoagulants: Secondary | ICD-10-CM

## 2013-10-12 LAB — POCT INR: INR: 2.5

## 2013-10-21 ENCOUNTER — Ambulatory Visit (INDEPENDENT_AMBULATORY_CARE_PROVIDER_SITE_OTHER): Payer: Medicare HMO | Admitting: Podiatry

## 2013-10-21 ENCOUNTER — Encounter: Payer: Self-pay | Admitting: Podiatry

## 2013-10-21 VITALS — BP 128/77 | HR 73 | Resp 16

## 2013-10-21 DIAGNOSIS — B351 Tinea unguium: Secondary | ICD-10-CM

## 2013-10-21 DIAGNOSIS — M79609 Pain in unspecified limb: Secondary | ICD-10-CM

## 2013-10-21 NOTE — Progress Notes (Signed)
She presents today with a chief complaint of painful elongated toenails one through 5 bilateral.  Objective: Pulses are palpable bilateral. Nails are thick yellow dystrophic onychomycotic and painful palpation.  Assessment: Pain in limb secondary to onychomycosis 1 through 5 bilateral.  Plan: Debridement of nails in thickness and length as a covered service secondary to pain.

## 2013-10-30 ENCOUNTER — Ambulatory Visit (INDEPENDENT_AMBULATORY_CARE_PROVIDER_SITE_OTHER): Payer: Medicare HMO | Admitting: Family Medicine

## 2013-10-30 ENCOUNTER — Encounter: Payer: Self-pay | Admitting: Family Medicine

## 2013-10-30 VITALS — BP 122/70 | HR 57 | Temp 97.4°F | Ht 62.5 in | Wt 135.0 lb

## 2013-10-30 DIAGNOSIS — Z0000 Encounter for general adult medical examination without abnormal findings: Secondary | ICD-10-CM | POA: Insufficient documentation

## 2013-10-30 DIAGNOSIS — I10 Essential (primary) hypertension: Secondary | ICD-10-CM

## 2013-10-30 DIAGNOSIS — Z9181 History of falling: Secondary | ICD-10-CM

## 2013-10-30 DIAGNOSIS — N189 Chronic kidney disease, unspecified: Secondary | ICD-10-CM

## 2013-10-30 DIAGNOSIS — Z23 Encounter for immunization: Secondary | ICD-10-CM

## 2013-10-30 DIAGNOSIS — E785 Hyperlipidemia, unspecified: Secondary | ICD-10-CM

## 2013-10-30 LAB — COMPREHENSIVE METABOLIC PANEL
ALBUMIN: 3.7 g/dL (ref 3.5–5.2)
ALT: 20 U/L (ref 0–35)
AST: 32 U/L (ref 0–37)
Alkaline Phosphatase: 89 U/L (ref 39–117)
BUN: 32 mg/dL — AB (ref 6–23)
CHLORIDE: 106 meq/L (ref 96–112)
CO2: 28 mEq/L (ref 19–32)
Calcium: 9.2 mg/dL (ref 8.4–10.5)
Creatinine, Ser: 1.5 mg/dL — ABNORMAL HIGH (ref 0.4–1.2)
GFR: 41.12 mL/min — ABNORMAL LOW (ref >60.00)
Glucose, Bld: 88 mg/dL (ref 70–99)
POTASSIUM: 4.4 meq/L (ref 3.5–5.1)
SODIUM: 140 meq/L (ref 135–145)
TOTAL PROTEIN: 7.3 g/dL (ref 6.0–8.3)
Total Bilirubin: 0.5 mg/dL (ref 0.2–1.2)

## 2013-10-30 LAB — CBC WITH DIFFERENTIAL/PLATELET
Basophils Absolute: 0 10*3/uL (ref 0.0–0.1)
Basophils Relative: 0.6 % (ref 0.0–3.0)
EOS ABS: 0.1 10*3/uL (ref 0.0–0.7)
EOS PCT: 3.6 % (ref 0.0–5.0)
HCT: 39.2 % (ref 36.0–46.0)
Hemoglobin: 13 g/dL (ref 12.0–15.0)
LYMPHS PCT: 46.5 % — AB (ref 12.0–46.0)
Lymphs Abs: 1.9 10*3/uL (ref 0.7–4.0)
MCHC: 33.2 g/dL (ref 30.0–36.0)
MCV: 88.4 fl (ref 78.0–100.0)
Monocytes Absolute: 0.6 10*3/uL (ref 0.1–1.0)
Monocytes Relative: 13.4 % — ABNORMAL HIGH (ref 3.0–12.0)
NEUTROS PCT: 35.9 % — AB (ref 43.0–77.0)
Neutro Abs: 1.5 10*3/uL (ref 1.4–7.7)
Platelets: 240 10*3/uL (ref 150.0–400.0)
RBC: 4.44 Mil/uL (ref 3.87–5.11)
RDW: 14.6 % (ref 11.5–15.5)
WBC: 4.1 10*3/uL (ref 4.0–10.5)

## 2013-10-30 LAB — TSH: TSH: 1.72 u[IU]/mL (ref 0.35–4.50)

## 2013-10-30 LAB — LIPID PANEL
CHOLESTEROL: 165 mg/dL (ref 0–200)
HDL: 34.2 mg/dL — ABNORMAL LOW (ref >39.00)
LDL Cholesterol: 120 mg/dL — ABNORMAL HIGH (ref 0–99)
Total CHOL/HDL Ratio: 5
Triglycerides: 54 mg/dL (ref 0.0–149.0)
VLDL: 10.8 mg/dL (ref 0.0–40.0)

## 2013-10-30 MED ORDER — SIMVASTATIN 20 MG PO TABS: ORAL_TABLET | ORAL | Status: DC

## 2013-10-30 MED ORDER — AMLODIPINE BESYLATE 5 MG PO TABS: 5.0000 mg | ORAL_TABLET | Freq: Every day | ORAL | Status: DC

## 2013-10-30 NOTE — Patient Instructions (Signed)
Stay active and stay social  Try a fiber supplement like citrucel or metamucil daily to help with the intermittent diarrhea and constipation  You are due for a tetanus shot - get that at your county [facility] dept  prevar vaccine here today  Labs today

## 2013-10-30 NOTE — Progress Notes (Signed)
Subjective:    Patient ID: Tracy Morales, female    DOB: Dec 08, 1919, 80 y.o.   MRN: [id number]  HPI I have personally reviewed the Medicare Annual Wellness questionnaire and have noted 1. The patient's medical and social history 2. Their use of alcohol, tobacco or illicit drugs 3. Their current medications and supplements 4. The patient's functional ability including ADL's, fall risks, home safety risks and hearing or visual             impairment. 5. Diet and physical activities 6. Evidence for depression or mood disorders  The patients weight, height, BMI have been recorded in the chart and visual acuity is per eye clinic.  I have made referrals, counseling and provided education to the patient based review of the above and I have provided the pt with a written personalized care plan for preventive services.  Has had some loose stools - for about 6 days- generally loose/ not watery No blood in stool  She takes iron pills so her stool is black This is unusual for her - tends to go back and forth between this and constipation  No abd pain or fever   See scanned forms.  Routine anticipatory guidance given to patient.  See health maintenance. Colon cancer screening 1/04 - does not want to do another one for screening , she would rather not do ifob  Breast cancer screening 2012 , and she is interested in a mammogram  Self breast exam-no lumps or changes  Flu vaccine 9/14 Tetanus vaccine 12/03  Pneumovax 12/03 , =will do the prevnar today  Zoster vaccine- cannot afford/ins does not cover   Advance directive- has a living will  Cognitive function addressed- see scanned forms- and if abnormal then additional documentation follows. - thinks her memory is fair  Family agrees   PMH and SH reviewed  Meds, vitals, and allergies reviewed.   ROS: See HPI.  Otherwise negative.    bp is stable today  No cp or palpitations or headaches or edema  No side effects to medicines  BP  Readings from Last 3 Encounters:  10/30/13 122/70  10/21/13 128/77  07/27/13 147/84     Hyperlipidemia  Due for a check  Lab Results  Component Value Date   CHOL 165 10/10/2012   HDL 30.10* 10/10/2012   LDLCALC 111* 10/10/2012   TRIG 122.0 10/10/2012   CHOLHDL 5 10/10/2012    Still on coumadin for vascular dz and DVT hx  Sees Dr Thedore MinsSingh  for her renal insuff - dialysis has been mentioned - watching her closely   Patient Active Problem List   Diagnosis Date Noted  . Encounter for Medicare annual wellness exam 10/30/2013  . Headache(784.0) 06/23/2013  . Head injury, unspecified 06/23/2013  . Fall 04/27/2013  . Pain in joint, ankle and foot 04/15/2013  . Risk for falls 04/11/2012  . Leg pain, bilateral 01/29/2012  . Heel pain 03/30/2011  . MICROSCOPIC HEMATURIA 08/29/2009  . CONDUCTIVE HEARING LOSS BILATERAL 08/08/2009  . DVT 04/12/2009  . EDEMA 04/07/2009  . HYPERPOTASSEMIA 01/25/2009  . BRADYCARDIA 10/26/2008  . DYSPNEA 10/26/2008  . INCONTINENCE, URGE 02/27/2008  . HYPERLIPIDEMIA 10/11/2006  . PERIPHERAL NEUROPATHY, LOWER EXTREMITIES, BILATERAL 10/11/2006  . HYPERTENSION 10/11/2006  . DIASTOLIC DYSFUNCTION 10/11/2006  . CAROTID STENOSIS 10/11/2006  . CEREBROVASCULAR DISEASE 10/11/2006  . PERIPHERAL VASCULAR DISEASE 10/11/2006  . ALLERGIC RHINITIS 10/11/2006  . RENAL INSUFFICIENCY, CHRONIC 10/11/2006  . OVERACTIVE BLADDER 10/11/2006  . OSTEOARTHRITIS 10/11/2006  .  POLYMYALGIA RHEUMATICA, HX OF 10/11/2006  . CARDIAC MURMUR, HX OF 10/11/2006   Past Medical History  Diagnosis Date  . Dyspnea     echo with normal EF. +diastolic dysfunction  . Occlusion and stenosis of carotid artery without mention of cerebral infarction 4/09    u/s L 40-59%, R 0-39%; stable for many years  . Peripheral vascular disease, unspecified   . Bradycardia   . HTN (hypertension)   . Hyperlipidemia   . RUQ pain   . Urge incontinence   . Chronic kidney disease, unspecified     chronic renal  insufficiency  . Mononeuritis of lower limb, unspecified     peripheral neuropathy, bilateral  . Cerebrovascular disease, unspecified   . Osteoarthrosis, unspecified whether generalized or localized, unspecified site   . Dizziness and giddiness   . Allergic rhinitis, cause unspecified   . Polymyalgia rheumatica     hx  . Hypertonicity of bladder     hx  . DVT (deep venous thrombosis)     R  . 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 Mild depression    Past Surgical History  Procedure Laterality Date  . Cataract extraction    . Cholecystectomy  2000  . Carotid doppler  4/02    bilateral plaque   . Echo      mild LVH EF 50% As  . Arterial doppler    . Vert. basilar insufficiency  2002  . Dexa  2/01    osteopenia  . Carotid doppler  8/06    non significant stenosis  . Zoster  5/02  . Colonoscopy  1/04    polyps  . Triger finger contracture  6/04  . 2d echo  8/06    mild aortic stenosis.   . Carotid doppler      R 40%; L 40-60%  . Ct scan  05/25/08    abd and pelvis- inflated urinary bladder and large amt stool throughout w/o blockage    History  Substance Use Topics  . Smoking status: Never Smoker   . Smokeless tobacco: Never Used     Comment: non smoker   . Alcohol Use: No   Family History  Problem Relation Age of Onset  . Hypertension Father   . Other Father     Cardiac problems  . Heart disease Father   . Other Brother     blood clot  . Other Sister     benign breast nodule   Allergies  Allergen Reactions  . Ace Inhibitors     REACTION: cough   Current Outpatient Prescriptions on File Prior to Visit  Medication Sig Dispense Refill  . acetaminophen (TYLENOL) 500 MG tablet Take 500 mg by mouth every 6 (six) hours as needed. OTC-UAD       . aspirin 81 MG EC tablet Take 81 mg by mouth daily.        . ferrous sulfate 325 (65 FE) MG tablet TAKE 1 TABLET BY MOUTH 2 TIMES A DAY  60 tablet  4  . Meclizine HCl (BONINE PO) Take by mouth as needed.       . mirtazapine (REMERON) 30 MG tablet TAKE 1 TABLET (30 MG TOTAL) BY MOUTH AT BEDTIME.  30 tablet  11  . NON FORMULARY Stool softener (unsure of name). OTC-UAD       . NON FORMULARY Walker with wheels - for use with ambulation once daily. 429.9, 715.90, 355.9       .  polyethylene glycol powder (MIRALAX) powder Take 17 g by mouth daily. OTC  UAD       . warfarin (COUMADIN) 1 MG tablet Take 1 tablet (1 mg total) by mouth as directed.  90 tablet  1  . warfarin (COUMADIN) 5 MG tablet TAKE 1 TABLET (5 MG TOTAL) BY MOUTH DAILY.  30 tablet  0  . warfarin (COUMADIN) 5 MG tablet TAKE AS DIRECTED  30 tablet  2   No current facility-administered medications on file prior to visit.    Review of Systems    Review of Systems  Constitutional: Negative for fever, appetite change, fatigue and unexpected weight change.  Eyes: Negative for pain and visual disturbance.  Respiratory: Negative for cough and shortness of breath.   Cardiovascular: Negative for cp or palpitations    Gastrointestinal: Negative for nausea, diarrhea and constipation.  Genitourinary: Negative for urgency and frequency.  Skin: Negative for pallor or rash  MSK pos for age related aches and pains   Neurological: Negative for weakness, light-headedness, numbness and headaches.  Hematological: Negative for adenopathy. Does not bruise/bleed easily.  Psychiatric/Behavioral: Negative for dysphoric mood. The patient is not nervous/anxious.      Objective:   Physical Exam  Constitutional: She appears well-developed and well-nourished. No distress.  HENT:  Head: Normocephalic and atraumatic.  Right Ear: External ear normal.  Left Ear: External ear normal.  Mouth/Throat: Oropharynx is clear and moist.  Eyes: Conjunctivae and EOM are normal. Pupils are equal, round, and reactive to light. No scleral icterus.  Neck: Normal range of motion. Neck supple. No JVD present. Carotid bruit is present. No thyromegaly present.  Cardiovascular:  Normal rate, regular rhythm and intact distal pulses.  Exam reveals no gallop.   Murmur heard. Pulmonary/Chest: Effort normal and breath sounds normal. No respiratory distress. She has no wheezes. She exhibits no tenderness.  Abdominal: Soft. Bowel sounds are normal. She exhibits no distension, no abdominal bruit and no mass. There is no tenderness.  Genitourinary: No breast swelling, tenderness, discharge or bleeding.  Breast exam: No mass, nodules, thickening, tenderness, bulging, retraction, inflamation, nipple discharge or skin changes noted.  No axillary or clavicular LA.      Musculoskeletal: Normal range of motion. She exhibits no edema and no tenderness.  No muscular or myofascial tenderness   Lymphadenopathy:    She has no cervical adenopathy.  Neurological: She is alert. She has normal reflexes. No cranial nerve deficit. She exhibits normal muscle tone. Coordination normal.  Skin: Skin is warm and dry. No rash noted. No erythema. No pallor.  Psychiatric: She has a normal mood and affect.          Assessment & Plan:

## 2013-10-30 NOTE — Progress Notes (Signed)
Pre visit review using our clinic review tool, if applicable. No additional management support is needed unless otherwise documented below in the visit note. 

## 2013-11-01 NOTE — Assessment & Plan Note (Signed)
bp in fair control at this time  BP Readings from Last 1 Encounters:  10/30/13 122/70   No changes needed Disc lifstyle change with low sodium diet and exercise  Labs today

## 2013-11-01 NOTE — Assessment & Plan Note (Signed)
This is followed by renal - and has been stable  Disc imp of good water intake and avoidance or renal toxic meds

## 2013-11-01 NOTE — Assessment & Plan Note (Signed)
Lipid panel today  Known atherosclerosis  Simvastatin and diet

## 2013-11-01 NOTE — Assessment & Plan Note (Signed)
Reviewed health habits including diet and exercise and skin cancer prevention Reviewed appropriate screening tests for age  Also reviewed health mt list, fam hx and immunization status , as well as social and family history   See HPI  Labs today prevnar today Will make her own mammogram appt

## 2013-11-01 NOTE — Assessment & Plan Note (Signed)
Pt now uses walker at all times-and no further falls Disc safety in and outside of the home

## 2013-11-03 ENCOUNTER — Encounter: Payer: Self-pay | Admitting: *Deleted

## 2013-11-10 ENCOUNTER — Telehealth: Payer: Self-pay

## 2013-11-10 NOTE — Telephone Encounter (Signed)
I spoke with Tracy Morales at [facility] med records and pt did have tdap on 10/11/2011 for laceration of hand. Updated immunization list.

## 2013-11-10 NOTE — Telephone Encounter (Signed)
[name] pts daughter left v/m; pt had tetanus shot at [facility] in 2013; Maren Reamer wants to know if still needs another tetanus shot. [name] request cb.

## 2013-11-10 NOTE — Telephone Encounter (Signed)
[name] notified as instructed.

## 2013-11-10 NOTE — Telephone Encounter (Signed)
Good, she is up to date- let her know if you have not already' Thanks

## 2013-11-23 ENCOUNTER — Ambulatory Visit (INDEPENDENT_AMBULATORY_CARE_PROVIDER_SITE_OTHER): Payer: Medicare HMO | Admitting: Family Medicine

## 2013-11-23 DIAGNOSIS — Z7901 Long term (current) use of anticoagulants: Secondary | ICD-10-CM

## 2013-11-23 DIAGNOSIS — Z5181 Encounter for therapeutic drug level monitoring: Secondary | ICD-10-CM

## 2013-11-23 DIAGNOSIS — I82409 Acute embolism and thrombosis of unspecified deep veins of unspecified lower extremity: Secondary | ICD-10-CM

## 2013-11-23 LAB — POCT INR: INR: 2.5

## 2013-12-10 ENCOUNTER — Ambulatory Visit: Payer: Self-pay | Admitting: Family Medicine

## 2013-12-10 ENCOUNTER — Encounter: Payer: Self-pay | Admitting: Family Medicine

## 2013-12-14 ENCOUNTER — Ambulatory Visit: Payer: Self-pay | Admitting: Family Medicine

## 2013-12-14 ENCOUNTER — Telehealth: Payer: Self-pay | Admitting: Family Medicine

## 2013-12-14 ENCOUNTER — Telehealth: Payer: Self-pay | Admitting: *Deleted

## 2013-12-14 NOTE — Telephone Encounter (Signed)
I am ok with holding her coumadin 5 days prior  Will forward this to [facility] and [address]

## 2013-12-14 NOTE — Telephone Encounter (Signed)
That is reassuring-thanks- if pt knows already-nothing to do

## 2013-12-14 NOTE — Telephone Encounter (Signed)
Patient had additional views done today.  It was recommended she receive a biopsy.  [place] called to let you know they were able to obtain her prior mammograms [facility] and now the radiologist is recommending a screening mammogram in one year. Radiologist put it as a Bi-rads- 2. Patient is aware.

## 2013-12-14 NOTE — Telephone Encounter (Signed)
[facility] letting her know to hold pt's coumadin 5 days prior to biopsy

## 2013-12-14 NOTE — Telephone Encounter (Signed)
Pt had additional imaging on right breast today. The radiologist is recommending a US biopsy of right breast. Due to pt's age and she is on coumadin they need to know if she needs to stop the coumadin 5 days before the biopsy, if not we would need to have her coumadin checked 2 days before procedure at the office and she would need to be within range, and [facility]. The radiologist wanted to know which one do you want pt to do, and then they would need an order letting them know which one you chose, please advise   Also Routed note to Carlena SaxBlair so she is aware

## 2013-12-14 NOTE — Telephone Encounter (Signed)
This is up to Dr. Milinda Antisower and/or the person performing the biopsy (regarding holding Coumadin before).  Dr. Milinda Antisower, will you let me know what you think and I'll call pt.

## 2013-12-15 ENCOUNTER — Encounter: Payer: Self-pay | Admitting: Family Medicine

## 2013-12-15 NOTE — Telephone Encounter (Signed)
Left vm with pt's daughter to call our office so that we can reschedule INR check according to date of breast bx.

## 2013-12-21 NOTE — Telephone Encounter (Signed)
Left another vm with daughter to get date of biopsy to reschedule next INR check.

## 2014-01-04 ENCOUNTER — Ambulatory Visit (INDEPENDENT_AMBULATORY_CARE_PROVIDER_SITE_OTHER): Payer: Medicare HMO | Admitting: Family Medicine

## 2014-01-04 DIAGNOSIS — I82409 Acute embolism and thrombosis of unspecified deep veins of unspecified lower extremity: Secondary | ICD-10-CM

## 2014-01-04 DIAGNOSIS — Z7901 Long term (current) use of anticoagulants: Secondary | ICD-10-CM

## 2014-01-04 DIAGNOSIS — Z5181 Encounter for therapeutic drug level monitoring: Secondary | ICD-10-CM

## 2014-01-04 LAB — POCT INR: INR: 2.7

## 2014-01-20 ENCOUNTER — Ambulatory Visit: Payer: Medicare HMO | Admitting: Podiatry

## 2014-01-27 ENCOUNTER — Ambulatory Visit (INDEPENDENT_AMBULATORY_CARE_PROVIDER_SITE_OTHER): Payer: Medicare HMO | Admitting: Podiatry

## 2014-01-27 DIAGNOSIS — M79609 Pain in unspecified limb: Secondary | ICD-10-CM

## 2014-01-27 DIAGNOSIS — B351 Tinea unguium: Secondary | ICD-10-CM

## 2014-01-27 DIAGNOSIS — M79676 Pain in unspecified toe(s): Secondary | ICD-10-CM

## 2014-01-27 NOTE — Progress Notes (Signed)
She presents today with a chief complaint of painful elongated toenails one through 5 bilateral.  Objective: Pulses are strongly palpable bilateral. Nails are thick yellow dystrophic onychomycotic and painful palpation.  Assessment: Pain in limb secondary to onychomycosis 1 through 5 bilateral.  Plan: Debridement of nails 1 through 5 bilateral covered service secondary to pain. 

## 2014-02-01 ENCOUNTER — Ambulatory Visit (INDEPENDENT_AMBULATORY_CARE_PROVIDER_SITE_OTHER): Payer: Medicare HMO

## 2014-02-01 DIAGNOSIS — Z23 Encounter for immunization: Secondary | ICD-10-CM

## 2014-02-15 ENCOUNTER — Ambulatory Visit (INDEPENDENT_AMBULATORY_CARE_PROVIDER_SITE_OTHER): Payer: Medicare HMO | Admitting: Family Medicine

## 2014-02-15 DIAGNOSIS — Z7901 Long term (current) use of anticoagulants: Secondary | ICD-10-CM

## 2014-02-15 DIAGNOSIS — Z5181 Encounter for therapeutic drug level monitoring: Secondary | ICD-10-CM

## 2014-02-15 DIAGNOSIS — I82409 Acute embolism and thrombosis of unspecified deep veins of unspecified lower extremity: Secondary | ICD-10-CM

## 2014-02-15 LAB — POCT INR: INR: 4.7

## 2014-02-22 ENCOUNTER — Ambulatory Visit (INDEPENDENT_AMBULATORY_CARE_PROVIDER_SITE_OTHER): Payer: Medicare HMO | Admitting: Family Medicine

## 2014-02-22 DIAGNOSIS — Z7901 Long term (current) use of anticoagulants: Secondary | ICD-10-CM

## 2014-02-22 DIAGNOSIS — I82409 Acute embolism and thrombosis of unspecified deep veins of unspecified lower extremity: Secondary | ICD-10-CM

## 2014-02-22 DIAGNOSIS — Z5181 Encounter for therapeutic drug level monitoring: Secondary | ICD-10-CM

## 2014-02-22 LAB — POCT INR: INR: 1.6

## 2014-03-12 ENCOUNTER — Other Ambulatory Visit: Payer: Self-pay | Admitting: Family Medicine

## 2014-03-15 NOTE — Telephone Encounter (Signed)
Electronic refill request, Carlena SaxBlair out of office please advise

## 2014-03-15 NOTE — Telephone Encounter (Signed)
done

## 2014-03-15 NOTE — Telephone Encounter (Signed)
Please refill for 6 mo 

## 2014-03-20 ENCOUNTER — Other Ambulatory Visit: Payer: Self-pay | Admitting: Family Medicine

## 2014-03-22 ENCOUNTER — Ambulatory Visit (INDEPENDENT_AMBULATORY_CARE_PROVIDER_SITE_OTHER): Payer: Medicare HMO | Admitting: *Deleted

## 2014-03-22 DIAGNOSIS — I82409 Acute embolism and thrombosis of unspecified deep veins of unspecified lower extremity: Secondary | ICD-10-CM

## 2014-03-22 DIAGNOSIS — Z5181 Encounter for therapeutic drug level monitoring: Secondary | ICD-10-CM

## 2014-03-22 DIAGNOSIS — Z7901 Long term (current) use of anticoagulants: Secondary | ICD-10-CM

## 2014-03-22 LAB — POCT INR: INR: 2.9

## 2014-03-22 NOTE — Telephone Encounter (Signed)
Please refill for 6 mo 

## 2014-03-22 NOTE — Telephone Encounter (Signed)
Electronic refill request, Blair out of office, please advise  

## 2014-03-22 NOTE — Telephone Encounter (Signed)
done

## 2014-04-28 ENCOUNTER — Ambulatory Visit: Payer: Medicare HMO | Admitting: Podiatry

## 2014-05-03 ENCOUNTER — Other Ambulatory Visit (INDEPENDENT_AMBULATORY_CARE_PROVIDER_SITE_OTHER): Payer: Medicare HMO

## 2014-05-03 DIAGNOSIS — I82409 Acute embolism and thrombosis of unspecified deep veins of unspecified lower extremity: Secondary | ICD-10-CM

## 2014-05-03 DIAGNOSIS — Z7901 Long term (current) use of anticoagulants: Secondary | ICD-10-CM

## 2014-05-03 DIAGNOSIS — Z5181 Encounter for therapeutic drug level monitoring: Secondary | ICD-10-CM

## 2014-05-03 LAB — PROTIME-INR
INR: 3.1 ratio — ABNORMAL HIGH (ref 0.8–1.0)
Prothrombin Time: 32.9 s — ABNORMAL HIGH (ref 9.6–13.1)

## 2014-05-05 ENCOUNTER — Ambulatory Visit: Payer: Medicare HMO | Admitting: Podiatry

## 2014-05-10 ENCOUNTER — Ambulatory Visit (INDEPENDENT_AMBULATORY_CARE_PROVIDER_SITE_OTHER): Payer: Medicare HMO | Admitting: Podiatry

## 2014-05-10 DIAGNOSIS — M79676 Pain in unspecified toe(s): Secondary | ICD-10-CM

## 2014-05-10 DIAGNOSIS — B351 Tinea unguium: Secondary | ICD-10-CM

## 2014-05-10 NOTE — Progress Notes (Signed)
She presents today with a chief complaint of painful elongated toenails one through 5 bilateral.  Objective: Pulses are strongly palpable bilateral. Nails are thick yellow dystrophic onychomycotic and painful palpation.  Assessment: Pain in limb secondary to onychomycosis 1 through 5 bilateral.  Plan: Debridement of nails 1 through 5 bilateral covered service secondary to pain. 

## 2014-06-06 ENCOUNTER — Emergency Department: Payer: Self-pay | Admitting: Emergency Medicine

## 2014-06-06 LAB — CBC
HCT: 40.3 % (ref 35.0–47.0)
HGB: 13 g/dL (ref 12.0–16.0)
MCH: 28.9 pg (ref 26.0–34.0)
MCHC: 32.1 g/dL (ref 32.0–36.0)
MCV: 90 fL (ref 80–100)
Platelet: 253 10*3/uL (ref 150–440)
RBC: 4.48 10*6/uL (ref 3.80–5.20)
RDW: 14.2 % (ref 11.5–14.5)
WBC: 6 10*3/uL (ref 3.6–11.0)

## 2014-06-06 LAB — COMPREHENSIVE METABOLIC PANEL
ALBUMIN: 3.5 g/dL (ref 3.4–5.0)
ALK PHOS: 120 U/L — AB
ANION GAP: 10 (ref 7–16)
AST: 38 U/L — AB (ref 15–37)
BUN: 24 mg/dL — ABNORMAL HIGH (ref 7–18)
Bilirubin,Total: 0.4 mg/dL (ref 0.2–1.0)
Calcium, Total: 9 mg/dL (ref 8.5–10.1)
Chloride: 105 mmol/L (ref 98–107)
Co2: 22 mmol/L (ref 21–32)
Creatinine: 1.46 mg/dL — ABNORMAL HIGH (ref 0.60–1.30)
EGFR (African American): 43 — ABNORMAL LOW
EGFR (Non-African Amer.): 36 — ABNORMAL LOW
Glucose: 138 mg/dL — ABNORMAL HIGH (ref 65–99)
Osmolality: 280 (ref 275–301)
POTASSIUM: 4.2 mmol/L (ref 3.5–5.1)
SGPT (ALT): 20 U/L
Sodium: 137 mmol/L (ref 136–145)
Total Protein: 8.3 g/dL — ABNORMAL HIGH (ref 6.4–8.2)

## 2014-06-06 LAB — TROPONIN I

## 2014-06-09 ENCOUNTER — Ambulatory Visit (INDEPENDENT_AMBULATORY_CARE_PROVIDER_SITE_OTHER): Payer: Medicare HMO | Admitting: Family Medicine

## 2014-06-09 ENCOUNTER — Encounter: Payer: Self-pay | Admitting: Family Medicine

## 2014-06-09 VITALS — BP 108/62 | HR 89 | Temp 97.8°F | Ht 62.5 in | Wt 124.2 lb

## 2014-06-09 DIAGNOSIS — Z7901 Long term (current) use of anticoagulants: Secondary | ICD-10-CM

## 2014-06-09 DIAGNOSIS — I82409 Acute embolism and thrombosis of unspecified deep veins of unspecified lower extremity: Secondary | ICD-10-CM

## 2014-06-09 DIAGNOSIS — Z5181 Encounter for therapeutic drug level monitoring: Secondary | ICD-10-CM

## 2014-06-09 DIAGNOSIS — M19011 Primary osteoarthritis, right shoulder: Secondary | ICD-10-CM

## 2014-06-09 DIAGNOSIS — R634 Abnormal weight loss: Secondary | ICD-10-CM | POA: Insufficient documentation

## 2014-06-09 DIAGNOSIS — M129 Arthropathy, unspecified: Secondary | ICD-10-CM

## 2014-06-09 DIAGNOSIS — R911 Solitary pulmonary nodule: Secondary | ICD-10-CM

## 2014-06-09 LAB — POCT INR: INR: 5.6

## 2014-06-09 NOTE — Patient Instructions (Signed)
Try to get in some more calories Lab for protime today  Stop at check out for referral for chest CT  TB skin test today- (PPD)- need to read that on Friday   Take tylenol for shoulder as needed 2 pills up to every 4 hours If you want to see orthopedics for your shoulder -call and let me know

## 2014-06-09 NOTE — Progress Notes (Signed)
Subjective:    Patient ID: Tracy Morales, female    DOB: 09/20/1919, 81 y.o.   MRN: [id number]  HPI Here for f/u of ED visit 06/06/14 at [facility] for right shoulder pain  Xray showed arthritis - given tramadol for this - not taking it however (tylenol -will take)- and it is feels better  Overall doing much better and can use it -does not an othro consult  cxr showed ? Emphysema with 12 mm suprahilar nodule - CT is recommended  No cough or sob as a rule  Does not remember ever having TB   She never smoked  No 2nd hand exposure except from a wood stove -she sits by it a lot  Never had TB - but thinks her mother may have had it (she died when she was a baby)    Has a hearing aid now   Glucose was 138 Cr stable at 1.46 Alk phos 120  Cbc nl  Troponin nl  Her wt is down 11 lb with bmi of 22  She does not eat much when she is in pain (her shoulder hurt for 3 weeks)- thinks she lost all the weight in 3 weeks      Review of Systems Review of Systems  Constitutional: Negative for fever, fatigue and unexpected weight change. pos for dec appetite and wt loss  Eyes: Negative for pain and visual disturbance.  Respiratory: Negative for cough and shortness of breath.  neg for wheeze  Cardiovascular: Negative for cp or palpitations    Gastrointestinal: Negative for nausea, diarrhea and constipation.  Genitourinary: Negative for urgency and frequency.  Skin: Negative for pallor or rash   MSK pos for R shoulder pain that is improved  Neurological: Negative for weakness, light-headedness, numbness and headaches.  Hematological: Negative for adenopathy. Does not bruise/bleed easily.  Psychiatric/Behavioral: Negative for dysphoric mood. The patient is not nervous/anxious.         Objective:   Physical Exam  Constitutional: She appears well-developed and well-nourished. No distress.  Frail appearing elderly female   HENT:  Head: Normocephalic and atraumatic.  Mouth/Throat: Oropharynx  is clear and moist.  Eyes: Conjunctivae and EOM are normal. Pupils are equal, round, and reactive to light. No scleral icterus.  Neck: Normal range of motion. Neck supple. Carotid bruit is present. Erythema present. No tracheal deviation present. No thyromegaly present.  Cardiovascular: Normal rate and regular rhythm.   Pulmonary/Chest: Effort normal and breath sounds normal. No respiratory distress. She has no wheezes. She has no rales. She exhibits no tenderness.  No crackles   Musculoskeletal: She exhibits tenderness. She exhibits no edema.  Diffuse tenderness R shoulder Limited rom -unable to abd past 90 deg Some acromion tenderness No swelling or skin change  Pos hawking test   Lymphadenopathy:    She has no cervical adenopathy.  Neurological: She is alert. She has normal reflexes.  Skin: Skin is warm and dry. No rash noted. No erythema.  Psychiatric: She has a normal mood and affect.          Assessment & Plan:   Problem List Items Addressed This Visit      Other   Arthritis of right shoulder region    Ongoing -improved now with tylenol Limited rom of R arm with findings of OA on xray  Rev ER note/studies   Declines orthopedic ref at this time- will update     Loss of weight - Primary    Per caregiver- she has  suffered from pain in shoulder and does not eat when she is uncomfortable That is improved Disc pain control and tx opt  Will watch wt carefully Exp imp of regular meals and snacks with protein  Rev ER studies      Relevant Orders      TB Skin Test (Completed)   Nodule of left lung    Incidental finding on cxr in ER along with changes of emphysema No hx of smoke exp- does have exp to wood burning stove ? Hx TB exp when young  Placed PPD today  Ref for CT of the chest w/o contrast  No symptoms     Relevant Orders      CT Chest Wo Contrast      TB Skin Test (Completed)    Other Visit Diagnoses    DVT (deep venous thrombosis)        Long term  current use of anticoagulant therapy        Encounter for therapeutic drug monitoring

## 2014-06-09 NOTE — Progress Notes (Signed)
Pre visit review using our clinic review tool, if applicable. No additional management support is needed unless otherwise documented below in the visit note. 

## 2014-06-10 ENCOUNTER — Telehealth: Payer: Self-pay | Admitting: Family Medicine

## 2014-06-10 NOTE — Telephone Encounter (Signed)
Pts daughter Maren ReamerMinerva said she was returning a call from Dr ToysRusower's medical assistant

## 2014-06-10 NOTE — Assessment & Plan Note (Signed)
Ongoing -improved now with tylenol Limited rom of R arm with findings of OA on xray  Rev ER note/studies   Declines orthopedic ref at this time- will update

## 2014-06-10 NOTE — Assessment & Plan Note (Signed)
Incidental finding on cxr in ER along with changes of emphysema No hx of smoke exp- does have exp to wood burning stove ? Hx TB exp when young  Placed PPD today  Ref for CT of the chest w/o contrast  No symptoms

## 2014-06-10 NOTE — Telephone Encounter (Signed)
Daughter advise it was Carlena SaxBlair with the coumadin clinic that called her but looking at the notes she has already spoke with daughter

## 2014-06-10 NOTE — Assessment & Plan Note (Signed)
Per caregiver- she has suffered from pain in shoulder and does not eat when she is uncomfortable That is improved Disc pain control and tx opt  Will watch wt carefully Exp imp of regular meals and snacks with protein  Rev ER studies

## 2014-06-11 ENCOUNTER — Ambulatory Visit: Payer: Self-pay | Admitting: Family Medicine

## 2014-06-11 LAB — TB SKIN TEST
INDURATION: 0 mm
TB Skin Test: NEGATIVE

## 2014-06-14 ENCOUNTER — Encounter: Payer: Self-pay | Admitting: Family Medicine

## 2014-06-14 ENCOUNTER — Ambulatory Visit (INDEPENDENT_AMBULATORY_CARE_PROVIDER_SITE_OTHER): Payer: Medicare HMO | Admitting: Family Medicine

## 2014-06-14 DIAGNOSIS — Z7901 Long term (current) use of anticoagulants: Secondary | ICD-10-CM

## 2014-06-14 LAB — POCT INR: INR: 2.6

## 2014-06-15 ENCOUNTER — Encounter: Payer: Self-pay | Admitting: Family Medicine

## 2014-06-24 ENCOUNTER — Other Ambulatory Visit: Payer: Self-pay | Admitting: Family Medicine

## 2014-06-24 NOTE — Telephone Encounter (Signed)
done

## 2014-06-24 NOTE — Telephone Encounter (Signed)
Please refill for a year  

## 2014-06-24 NOTE — Telephone Encounter (Signed)
Electronic refill request, please advise  

## 2014-06-30 ENCOUNTER — Other Ambulatory Visit: Payer: Self-pay | Admitting: Family Medicine

## 2014-07-26 ENCOUNTER — Other Ambulatory Visit (INDEPENDENT_AMBULATORY_CARE_PROVIDER_SITE_OTHER): Payer: Medicare HMO

## 2014-07-26 DIAGNOSIS — Z5181 Encounter for therapeutic drug level monitoring: Secondary | ICD-10-CM

## 2014-07-26 DIAGNOSIS — I82409 Acute embolism and thrombosis of unspecified deep veins of unspecified lower extremity: Secondary | ICD-10-CM

## 2014-07-26 DIAGNOSIS — I82401 Acute embolism and thrombosis of unspecified deep veins of right lower extremity: Secondary | ICD-10-CM

## 2014-07-26 DIAGNOSIS — Z7901 Long term (current) use of anticoagulants: Secondary | ICD-10-CM

## 2014-07-26 LAB — POCT INR: INR: 3.3

## 2014-08-09 ENCOUNTER — Other Ambulatory Visit: Payer: Medicare HMO

## 2014-08-12 ENCOUNTER — Ambulatory Visit (INDEPENDENT_AMBULATORY_CARE_PROVIDER_SITE_OTHER): Payer: Medicare HMO | Admitting: Podiatry

## 2014-08-12 DIAGNOSIS — B351 Tinea unguium: Secondary | ICD-10-CM

## 2014-08-12 DIAGNOSIS — E1149 Type 2 diabetes mellitus with other diabetic neurological complication: Secondary | ICD-10-CM

## 2014-08-12 DIAGNOSIS — M79676 Pain in unspecified toe(s): Secondary | ICD-10-CM

## 2014-08-12 DIAGNOSIS — E1140 Type 2 diabetes mellitus with diabetic neuropathy, unspecified: Secondary | ICD-10-CM | POA: Diagnosis not present

## 2014-08-12 DIAGNOSIS — Q828 Other specified congenital malformations of skin: Secondary | ICD-10-CM

## 2014-08-12 NOTE — Progress Notes (Signed)
Patient ID: Tracy Morales, female   DOB: 11/17/1919, 81 y.o.   MRN: [id number]  Subjective: 81 y.o.-year-old female returns the office today for painful, elongated, thickened toenails which she is unable to trim herself. Denies any redness or drainage around the nails. Denies any acute changes since last appointment and no new complaints today. Denies any systemic complaints such as fevers, chills, nausea, vomiting.   Objective: AAO 3, NAD DP pulses palpable, PT pulses decreased, CRT less than 3 seconds Protective sensation decreased with Simms Weinstein monofilament, Achilles tendon reflex intact.  Nails hypertrophic, dystrophic, elongated, brittle, discolored 10. There is tenderness overlying these nails 1-5 bilaterally. There is no surrounding erythema or drainage along the nail sites. Right foot submetatarsal 1 hyperkerotic lesion. Upon debridement, no underlying ulceration, drainage, or clinical signs of infection.  No open lesions or other pre-ulcerative lesions are identified. No other areas of tenderness bilateral lower extremities.Decrease in medial arch height bilaterally. Bilateral HAV. No overlying edema, erythema, increased warmth. No pain with calf compression, swelling, warmth, erythema.  Assessment: Patient presents with symptomatic onychomycosis; porokeratosis right submetatarsal 1  Plan: -Treatment options including alternatives, risks, complications were discussed -Nails sharply debrided 10 without complication/bleeding. -Hyperkerotic lesion sharply debrided x 1 without complications/bleeding.  -Discussed daily foot inspection. If there are any changes, to call the office immediately.  -Follow-up in 3 months or sooner if any problems are to arise. In the meantime, encouraged to call the office with any questions, concerns, changes symptoms.

## 2014-08-23 ENCOUNTER — Other Ambulatory Visit (INDEPENDENT_AMBULATORY_CARE_PROVIDER_SITE_OTHER): Payer: Medicare HMO

## 2014-08-23 ENCOUNTER — Ambulatory Visit: Payer: Medicare HMO

## 2014-08-23 ENCOUNTER — Telehealth: Payer: Self-pay

## 2014-08-23 DIAGNOSIS — Z7901 Long term (current) use of anticoagulants: Secondary | ICD-10-CM

## 2014-08-23 DIAGNOSIS — I82409 Acute embolism and thrombosis of unspecified deep veins of unspecified lower extremity: Secondary | ICD-10-CM

## 2014-08-23 DIAGNOSIS — Z5181 Encounter for therapeutic drug level monitoring: Secondary | ICD-10-CM

## 2014-08-23 LAB — POCT INR: INR: 3.2

## 2014-08-23 NOTE — Telephone Encounter (Signed)
Patient aware of results and directions.  Appointment made.

## 2014-08-23 NOTE — Telephone Encounter (Signed)
-----   Message from Judy PimpleMarne A Tower, MD sent at 08/23/2014  1:45 PM EDT ----- Goal 2.5 to 3.5 Continue current dose  Re check 1 mo please

## 2014-09-23 ENCOUNTER — Other Ambulatory Visit: Payer: Medicare HMO

## 2014-09-24 ENCOUNTER — Other Ambulatory Visit (INDEPENDENT_AMBULATORY_CARE_PROVIDER_SITE_OTHER): Payer: Medicare HMO

## 2014-09-24 ENCOUNTER — Other Ambulatory Visit: Payer: Medicare HMO

## 2014-09-24 DIAGNOSIS — Z7901 Long term (current) use of anticoagulants: Secondary | ICD-10-CM

## 2014-09-24 DIAGNOSIS — Z5181 Encounter for therapeutic drug level monitoring: Secondary | ICD-10-CM | POA: Diagnosis not present

## 2014-09-24 DIAGNOSIS — I82409 Acute embolism and thrombosis of unspecified deep veins of unspecified lower extremity: Secondary | ICD-10-CM

## 2014-09-24 LAB — POCT INR: INR: 3.8

## 2014-10-05 ENCOUNTER — Other Ambulatory Visit (INDEPENDENT_AMBULATORY_CARE_PROVIDER_SITE_OTHER): Payer: Medicare HMO

## 2014-10-05 DIAGNOSIS — I82409 Acute embolism and thrombosis of unspecified deep veins of unspecified lower extremity: Secondary | ICD-10-CM

## 2014-10-05 DIAGNOSIS — Z7901 Long term (current) use of anticoagulants: Secondary | ICD-10-CM | POA: Diagnosis not present

## 2014-10-05 DIAGNOSIS — Z5181 Encounter for therapeutic drug level monitoring: Secondary | ICD-10-CM

## 2014-10-05 LAB — POCT INR: INR: 2.1

## 2014-10-21 ENCOUNTER — Other Ambulatory Visit (INDEPENDENT_AMBULATORY_CARE_PROVIDER_SITE_OTHER): Payer: Medicare HMO

## 2014-10-21 DIAGNOSIS — I82409 Acute embolism and thrombosis of unspecified deep veins of unspecified lower extremity: Secondary | ICD-10-CM

## 2014-10-21 DIAGNOSIS — Z7901 Long term (current) use of anticoagulants: Secondary | ICD-10-CM

## 2014-10-21 DIAGNOSIS — Z5181 Encounter for therapeutic drug level monitoring: Secondary | ICD-10-CM

## 2014-10-21 LAB — POCT INR: INR: 3.1

## 2014-10-26 ENCOUNTER — Other Ambulatory Visit: Payer: Self-pay | Admitting: Family Medicine

## 2014-11-04 ENCOUNTER — Other Ambulatory Visit (INDEPENDENT_AMBULATORY_CARE_PROVIDER_SITE_OTHER): Payer: Medicare HMO

## 2014-11-04 DIAGNOSIS — Z5181 Encounter for therapeutic drug level monitoring: Secondary | ICD-10-CM | POA: Diagnosis not present

## 2014-11-04 DIAGNOSIS — I82409 Acute embolism and thrombosis of unspecified deep veins of unspecified lower extremity: Secondary | ICD-10-CM

## 2014-11-04 DIAGNOSIS — Z7901 Long term (current) use of anticoagulants: Secondary | ICD-10-CM

## 2014-11-04 LAB — POCT INR: INR: 3.9

## 2014-11-11 ENCOUNTER — Ambulatory Visit (INDEPENDENT_AMBULATORY_CARE_PROVIDER_SITE_OTHER): Payer: Medicare HMO | Admitting: Podiatry

## 2014-11-11 DIAGNOSIS — E1149 Type 2 diabetes mellitus with other diabetic neurological complication: Secondary | ICD-10-CM

## 2014-11-11 DIAGNOSIS — B351 Tinea unguium: Secondary | ICD-10-CM | POA: Diagnosis not present

## 2014-11-11 DIAGNOSIS — E1140 Type 2 diabetes mellitus with diabetic neuropathy, unspecified: Secondary | ICD-10-CM | POA: Diagnosis not present

## 2014-11-11 DIAGNOSIS — M79676 Pain in unspecified toe(s): Secondary | ICD-10-CM

## 2014-11-11 NOTE — Progress Notes (Signed)
Subjective: 81 y.o.-year-old female  returns the office today for painful, elongated, thickened toenails which she is unable to trim herself. Denies any redness or drainage around the nails. Denies any acute changes since last appointment and no new complaints today. Denies any systemic complaints such as fevers, chills, nausea, vomiting.   Objective: AAO 3, NAD DP/PT pulses palpable, CRT less than 3 seconds Decreased protective sensation with SWMF.  Nails hypertrophic, dystrophic, elongated, brittle, discolored 10. There is tenderness overlying the nails 1-5 bilaterally. There is no surrounding erythema or drainage along the nail sites. No open lesions or pre-ulcerative lesions are identified. No other areas of tenderness bilateral lower extremities. No overlying edema, erythema, increased warmth. No pain with calf compression, swelling, warmth, erythema.  Assessment: Patient presents with symptomatic onychomycosis  Plan: -Treatment options including alternatives, risks, complications were discussed -Nails sharply debrided 10 without complication/bleeding. -Discussed daily foot inspection. If there are any changes, to call the office immediately.  -Follow-up in 3 months or sooner if any problems are to arise. In the meantime, encouraged to call the office with any questions, concerns, changes symptoms.

## 2014-11-17 ENCOUNTER — Telehealth: Payer: Self-pay | Admitting: Family Medicine

## 2014-11-17 NOTE — Telephone Encounter (Signed)
Done and in IN box 

## 2014-11-17 NOTE — Telephone Encounter (Signed)
Form placed in your inbox

## 2014-11-17 NOTE — Telephone Encounter (Signed)
Left voicemail letting [name] know form ready for pick-up

## 2014-11-17 NOTE — Telephone Encounter (Signed)
Son in law dropped of disability parking placard form for dr tower to sign Please call [name]  @ [phone] when ready for pick up On shapale's desk

## 2014-11-19 ENCOUNTER — Ambulatory Visit (INDEPENDENT_AMBULATORY_CARE_PROVIDER_SITE_OTHER): Payer: Medicare HMO | Admitting: *Deleted

## 2014-11-19 ENCOUNTER — Encounter: Payer: Self-pay | Admitting: Family Medicine

## 2014-11-19 ENCOUNTER — Ambulatory Visit (INDEPENDENT_AMBULATORY_CARE_PROVIDER_SITE_OTHER): Payer: Medicare HMO | Admitting: Family Medicine

## 2014-11-19 VITALS — BP 130/70 | HR 76 | Temp 97.6°F | Ht 62.5 in | Wt 131.2 lb

## 2014-11-19 DIAGNOSIS — I82409 Acute embolism and thrombosis of unspecified deep veins of unspecified lower extremity: Secondary | ICD-10-CM

## 2014-11-19 DIAGNOSIS — Z5181 Encounter for therapeutic drug level monitoring: Secondary | ICD-10-CM | POA: Diagnosis not present

## 2014-11-19 DIAGNOSIS — E785 Hyperlipidemia, unspecified: Secondary | ICD-10-CM

## 2014-11-19 DIAGNOSIS — L659 Nonscarring hair loss, unspecified: Secondary | ICD-10-CM

## 2014-11-19 DIAGNOSIS — Z7901 Long term (current) use of anticoagulants: Secondary | ICD-10-CM | POA: Diagnosis not present

## 2014-11-19 DIAGNOSIS — I10 Essential (primary) hypertension: Secondary | ICD-10-CM

## 2014-11-19 LAB — CBC WITH DIFFERENTIAL/PLATELET
BASOS ABS: 0 10*3/uL (ref 0.0–0.1)
Basophils Relative: 0.6 % (ref 0.0–3.0)
EOS ABS: 0.2 10*3/uL (ref 0.0–0.7)
Eosinophils Relative: 4.4 % (ref 0.0–5.0)
HCT: 38.1 % (ref 36.0–46.0)
Hemoglobin: 12.5 g/dL (ref 12.0–15.0)
LYMPHS PCT: 41.2 % (ref 12.0–46.0)
Lymphs Abs: 1.6 10*3/uL (ref 0.7–4.0)
MCHC: 32.9 g/dL (ref 30.0–36.0)
MCV: 88.5 fl (ref 78.0–100.0)
MONO ABS: 0.6 10*3/uL (ref 0.1–1.0)
Monocytes Relative: 16.6 % — ABNORMAL HIGH (ref 3.0–12.0)
NEUTROS PCT: 37.2 % — AB (ref 43.0–77.0)
Neutro Abs: 1.4 10*3/uL (ref 1.4–7.7)
Platelets: 243 10*3/uL (ref 150.0–400.0)
RBC: 4.31 Mil/uL (ref 3.87–5.11)
RDW: 15.1 % (ref 11.5–15.5)
WBC: 3.8 10*3/uL — ABNORMAL LOW (ref 4.0–10.5)

## 2014-11-19 LAB — COMPREHENSIVE METABOLIC PANEL
ALT: 15 U/L (ref 0–35)
AST: 27 U/L (ref 0–37)
Albumin: 3.5 g/dL (ref 3.5–5.2)
Alkaline Phosphatase: 98 U/L (ref 39–117)
BILIRUBIN TOTAL: 0.4 mg/dL (ref 0.2–1.2)
BUN: 27 mg/dL — ABNORMAL HIGH (ref 6–23)
CO2: 28 mEq/L (ref 19–32)
Calcium: 9 mg/dL (ref 8.4–10.5)
Chloride: 104 mEq/L (ref 96–112)
Creatinine, Ser: 1.41 mg/dL — ABNORMAL HIGH (ref 0.40–1.20)
GFR: 44.74 mL/min — ABNORMAL LOW (ref >60.00)
Glucose, Bld: 90 mg/dL (ref 70–99)
Potassium: 4.5 mEq/L (ref 3.5–5.1)
SODIUM: 136 meq/L (ref 135–145)
Total Protein: 6.9 g/dL (ref 6.0–8.3)

## 2014-11-19 LAB — LIPID PANEL
CHOL/HDL RATIO: 5
CHOLESTEROL: 161 mg/dL (ref 0–200)
HDL: 31.4 mg/dL — ABNORMAL LOW (ref >39.00)
LDL Cholesterol: 110 mg/dL — ABNORMAL HIGH (ref 0–99)
NONHDL: 129.6
Triglycerides: 97 mg/dL (ref 0.0–149.0)
VLDL: 19.4 mg/dL (ref 0.0–40.0)

## 2014-11-19 LAB — POCT INR: INR: 3.3

## 2014-11-19 LAB — TSH: TSH: 2.06 u[IU]/mL (ref 0.35–4.50)

## 2014-11-19 MED ORDER — SIMVASTATIN 20 MG PO TABS: ORAL_TABLET | ORAL | Status: DC

## 2014-11-19 MED ORDER — AMLODIPINE BESYLATE 5 MG PO TABS: ORAL_TABLET | ORAL | Status: DC

## 2014-11-19 MED ORDER — MIRTAZAPINE 30 MG PO TABS: ORAL_TABLET | ORAL | Status: DC

## 2014-11-19 MED ORDER — FERROUS SULFATE 325 (65 FE) MG PO TABS: 325.0000 mg | ORAL_TABLET | Freq: Two times a day (BID) | ORAL | Status: DC

## 2014-11-19 NOTE — Progress Notes (Signed)
Pre visit review using our clinic review tool, if applicable. No additional management support is needed unless otherwise documented below in the visit note. 

## 2014-11-19 NOTE — Progress Notes (Signed)
Subjective:    Patient ID: Tracy Morales, female    DOB: Aug 11, 1919, 81 y.o.   MRN: [id number]  HPI Here for f/u of chronic health problems   Wt is up 7 lb  bmi is 23  Was struggling prev with wt loss Is eating more regularly- making herself eat  Appetite is still not great- but making the effort   No changes in health status overall  Thinks her hair is coming out a bit more    Sees renal for f/u of renal insuff Has been for follow up - pretty stable   Lab Results  Component Value Date   CREATININE 1.46* 06/06/2014   BUN 24* 06/06/2014   NA 137 06/06/2014   K 4.2 06/06/2014   CL 105 06/06/2014   CO2 22 06/06/2014    bp is up a bit  today  No cp or palpitations or headaches or edema  No side effects to medicines  BP Readings from Last 3 Encounters:  11/19/14 148/78  06/09/14 108/62  10/30/13 122/70      Hyperlipidemia On zocor and diet  Lab Results  Component Value Date   CHOL 165 10/30/2013   HDL 34.20* 10/30/2013   LDLCALC 120* 10/30/2013   TRIG 54.0 10/30/2013   CHOLHDL 5 10/30/2013    Due for a check    Patient Active Problem List   Diagnosis Date Noted  . Hair loss 11/19/2014  . Loss of weight 06/09/2014  . Arthritis of right shoulder region 06/09/2014  . Nodule of left lung 06/09/2014  . Encounter for Medicare annual wellness exam 10/30/2013  . H/O fall 04/27/2013  . Pain in joint, ankle and foot 04/15/2013  . Risk for falls 04/11/2012  . Leg pain, bilateral 01/29/2012  . MICROSCOPIC HEMATURIA 08/29/2009  . CONDUCTIVE HEARING LOSS BILATERAL 08/08/2009  . DVT 04/12/2009  . EDEMA 04/07/2009  . HYPERPOTASSEMIA 01/25/2009  . BRADYCARDIA 10/26/2008  . DYSPNEA 10/26/2008  . INCONTINENCE, URGE 02/27/2008  . Hyperlipidemia 10/11/2006  . PERIPHERAL NEUROPATHY, LOWER EXTREMITIES, BILATERAL 10/11/2006  . Essential hypertension 10/11/2006  . DIASTOLIC DYSFUNCTION 10/11/2006  . CAROTID STENOSIS 10/11/2006  . CEREBROVASCULAR DISEASE 10/11/2006    . PERIPHERAL VASCULAR DISEASE 10/11/2006  . ALLERGIC RHINITIS 10/11/2006  . RENAL INSUFFICIENCY, CHRONIC 10/11/2006  . OVERACTIVE BLADDER 10/11/2006  . OSTEOARTHRITIS 10/11/2006  . POLYMYALGIA RHEUMATICA, HX OF 10/11/2006  . CARDIAC MURMUR, HX OF 10/11/2006   Past Medical History  Diagnosis Date  . Dyspnea     echo with normal EF. +diastolic dysfunction  . Occlusion and stenosis of carotid artery without mention of cerebral infarction 4/09    u/s L 40-59%, R 0-39%; stable for many years  . Peripheral vascular disease, unspecified   . Bradycardia   . HTN (hypertension)   . Hyperlipidemia   . RUQ pain   . Urge incontinence   . Chronic kidney disease, unspecified     chronic renal insufficiency  . Mononeuritis of lower limb, unspecified     peripheral neuropathy, bilateral  . Cerebrovascular disease, unspecified   . Osteoarthrosis, unspecified whether generalized or localized, unspecified site   . Dizziness and giddiness   . Allergic rhinitis, cause unspecified   . Polymyalgia rheumatica     hx  . Hypertonicity of bladder     hx  . DVT (deep venous thrombosis)     R  . 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 Mild depression    Past Surgical History  Procedure Laterality Date  . Cataract extraction    . Cholecystectomy  2000  . Carotid doppler  4/02    bilateral plaque   . Echo      mild LVH EF 50% As  . Arterial doppler    . Vert. basilar insufficiency  2002  . Dexa  2/01    osteopenia  . Carotid doppler  8/06    non significant stenosis  . Zoster  5/02  . Colonoscopy  1/04    polyps  . Triger finger contracture  6/04  . 2d echo  8/06    mild aortic stenosis.   . Carotid doppler      R 40%; L 40-60%  . Ct scan  05/25/08    abd and pelvis- inflated urinary bladder and large amt stool throughout w/o blockage    History  Substance Use Topics  . Smoking status: Never Smoker   . Smokeless tobacco: Never Used     Comment: non smoker   . Alcohol  Use: No   Family History  Problem Relation Age of Onset  . Hypertension Father   . Other Father     Cardiac problems  . Heart disease Father   . Other Brother     blood clot  . Other Sister     benign breast nodule   Allergies  Allergen Reactions  . Ace Inhibitors     REACTION: cough   Current Outpatient Prescriptions on File Prior to Visit  Medication Sig Dispense Refill  . acetaminophen (TYLENOL) 500 MG tablet Take 500 mg by mouth every 6 (six) hours as needed. OTC-UAD     . aspirin 81 MG EC tablet Take 81 mg by mouth daily.      . Meclizine HCl (BONINE PO) Take by mouth as needed.    . NON FORMULARY Walker with wheels - for use with ambulation once daily. 429.9, 715.90, 355.9     . psyllium (METAMUCIL) 58.6 % packet Take 1 packet by mouth daily.    Marland Kitchen warfarin (COUMADIN) 1 MG tablet Take 1 tablet (1 mg total) by mouth as directed. 90 tablet 1  . warfarin (COUMADIN) 5 MG tablet TAKE AS DIRECTED 30 tablet 5  . warfarin (COUMADIN) 5 MG tablet TAKE AS DIRECTED 30 tablet 5   No current facility-administered medications on file prior to visit.   Review of Systems Review of Systems  Constitutional: Negative for fever, appetite change, fatigue and unexpected weight change.  Eyes: Negative for pain and visual disturbance.  Respiratory: Negative for cough and shortness of breath.   Cardiovascular: Negative for cp or palpitations    Gastrointestinal: Negative for nausea,  and constipation. occ a hard time with bowel continence with loose stool  Genitourinary: Negative for urgency and frequency.  Skin: Negative for pallor or rash   Neurological: Negative for weakness, light-headedness, numbness and headaches.  Hematological: Negative for adenopathy. Does not bruise/bleed easily.  Psychiatric/Behavioral: Negative for dysphoric mood. The patient is not nervous/anxious (most of the time)          Objective:   Physical Exam  Constitutional: She appears well-developed and  well-nourished. No distress.  Frail appearing elderly female  HOH   HENT:  Head: Normocephalic and atraumatic.  Mouth/Throat: Oropharynx is clear and moist.  Eyes: Conjunctivae and EOM are normal. Pupils are equal, round, and reactive to light.  Neck: Normal range of motion. Neck supple. No JVD present. Carotid bruit is present. No thyromegaly present.  Cardiovascular: Normal rate,  regular rhythm, normal heart sounds and intact distal pulses.  Exam reveals no gallop.   Pulmonary/Chest: Effort normal and breath sounds normal. No respiratory distress. She has no wheezes. She has no rales.  No crackles  Abdominal: Soft. Bowel sounds are normal. She exhibits no distension, no abdominal bruit and no mass. There is no tenderness.  Musculoskeletal: She exhibits no edema.  Lymphadenopathy:    She has no cervical adenopathy.  Neurological: She is alert. She has normal reflexes.  Skin: Skin is warm and dry. No rash noted.  Psychiatric: She has a normal mood and affect.          Assessment & Plan:   Problem List Items Addressed This Visit    Essential hypertension - Primary    bp in fair control at this time  BP Readings from Last 1 Encounters:  11/19/14 130/70   No changes needed Disc lifstyle change with low sodium diet and exercise  Labs ordered today      Relevant Medications   amLODipine (NORVASC) 5 MG tablet   simvastatin (ZOCOR) 20 MG tablet   Other Relevant Orders   CBC with Differential/Platelet (Completed)   Comprehensive metabolic panel (Completed)   TSH (Completed)   Lipid panel (Completed)   Hair loss    Suspect female patterned loss Also age rel Disc nutrition  TSH with lab today      Hyperlipidemia    Lipid panel today Diet is fairly good  Disc goal for HDL and LDL      Relevant Medications   amLODipine (NORVASC) 5 MG tablet   simvastatin (ZOCOR) 20 MG tablet   Other Relevant Orders   Lipid panel (Completed)    Other Visit Diagnoses    DVT (deep  venous thrombosis)        Relevant Medications    amLODipine (NORVASC) 5 MG tablet    simvastatin (ZOCOR) 20 MG tablet    Long term current use of anticoagulant therapy        Encounter for therapeutic drug monitoring

## 2014-11-19 NOTE — Patient Instructions (Signed)
Labs today  I'm glad you are gaining weight  Continue regular meals  Be careful not to fall  Take care of yourself

## 2014-11-20 NOTE — Assessment & Plan Note (Signed)
Suspect female patterned loss Also age rel Disc nutrition  TSH with lab today

## 2014-11-20 NOTE — Assessment & Plan Note (Signed)
bp in fair control at this time  BP Readings from Last 1 Encounters:  11/19/14 130/70   No changes needed Disc lifstyle change with low sodium diet and exercise  Labs ordered today

## 2014-11-20 NOTE — Assessment & Plan Note (Signed)
Lipid panel today Diet is fairly good  Disc goal for HDL and LDL

## 2014-11-21 ENCOUNTER — Other Ambulatory Visit: Payer: Self-pay | Admitting: Family Medicine

## 2014-11-22 ENCOUNTER — Encounter: Payer: Self-pay | Admitting: *Deleted

## 2014-12-16 ENCOUNTER — Ambulatory Visit (INDEPENDENT_AMBULATORY_CARE_PROVIDER_SITE_OTHER): Payer: Medicare HMO | Admitting: *Deleted

## 2014-12-16 DIAGNOSIS — Z5181 Encounter for therapeutic drug level monitoring: Secondary | ICD-10-CM

## 2014-12-16 DIAGNOSIS — I82409 Acute embolism and thrombosis of unspecified deep veins of unspecified lower extremity: Secondary | ICD-10-CM | POA: Diagnosis not present

## 2014-12-16 DIAGNOSIS — Z7901 Long term (current) use of anticoagulants: Secondary | ICD-10-CM | POA: Diagnosis not present

## 2014-12-16 LAB — POCT INR: INR: 2.3

## 2014-12-16 NOTE — Progress Notes (Signed)
Pre visit review using our clinic review tool, if applicable. No additional management support is needed unless otherwise documented below in the visit note. 

## 2015-01-13 ENCOUNTER — Ambulatory Visit (INDEPENDENT_AMBULATORY_CARE_PROVIDER_SITE_OTHER): Payer: Medicare HMO | Admitting: *Deleted

## 2015-01-13 DIAGNOSIS — Z5181 Encounter for therapeutic drug level monitoring: Secondary | ICD-10-CM | POA: Diagnosis not present

## 2015-01-13 DIAGNOSIS — I82409 Acute embolism and thrombosis of unspecified deep veins of unspecified lower extremity: Secondary | ICD-10-CM | POA: Diagnosis not present

## 2015-01-13 DIAGNOSIS — Z7901 Long term (current) use of anticoagulants: Secondary | ICD-10-CM | POA: Diagnosis not present

## 2015-01-13 LAB — POCT INR: INR: 2.8

## 2015-01-13 NOTE — Progress Notes (Signed)
Pre visit review using our clinic review tool, if applicable. No additional management support is needed unless otherwise documented below in the visit note. 

## 2015-02-10 ENCOUNTER — Ambulatory Visit (INDEPENDENT_AMBULATORY_CARE_PROVIDER_SITE_OTHER): Payer: Medicare HMO | Admitting: *Deleted

## 2015-02-10 DIAGNOSIS — I82409 Acute embolism and thrombosis of unspecified deep veins of unspecified lower extremity: Secondary | ICD-10-CM | POA: Diagnosis not present

## 2015-02-10 DIAGNOSIS — Z7901 Long term (current) use of anticoagulants: Secondary | ICD-10-CM

## 2015-02-10 DIAGNOSIS — Z5181 Encounter for therapeutic drug level monitoring: Secondary | ICD-10-CM

## 2015-02-10 LAB — POCT INR: INR: 2.8

## 2015-02-10 NOTE — Progress Notes (Signed)
Pre visit review using our clinic review tool, if applicable. No additional management support is needed unless otherwise documented below in the visit note. 

## 2015-02-15 ENCOUNTER — Ambulatory Visit (INDEPENDENT_AMBULATORY_CARE_PROVIDER_SITE_OTHER): Payer: Medicare HMO | Admitting: Podiatry

## 2015-02-15 DIAGNOSIS — E1140 Type 2 diabetes mellitus with diabetic neuropathy, unspecified: Secondary | ICD-10-CM

## 2015-02-15 DIAGNOSIS — Q828 Other specified congenital malformations of skin: Secondary | ICD-10-CM | POA: Diagnosis not present

## 2015-02-15 DIAGNOSIS — M79676 Pain in unspecified toe(s): Secondary | ICD-10-CM

## 2015-02-15 DIAGNOSIS — B351 Tinea unguium: Secondary | ICD-10-CM

## 2015-02-15 DIAGNOSIS — E1149 Type 2 diabetes mellitus with other diabetic neurological complication: Secondary | ICD-10-CM

## 2015-02-15 NOTE — Telephone Encounter (Signed)
Left voicemail letting pt's daughter know form ready for pick-up

## 2015-02-15 NOTE — Telephone Encounter (Signed)
Form in Dr. Tower's inbox 

## 2015-02-15 NOTE — Progress Notes (Signed)
Subjective: 81 y.o.-year-old female  returns the office today for painful, elongated, thickened toenails which she is unable to trim herself. Denies any redness or drainage around the nails. Denies any acute changes since last appointment and no new complaints today. Denies any systemic complaints such as fevers, chills, nausea, vomiting.   Objective: AAO 3, NAD DP/PT pulses palpable 1/4, CRT less than 3 seconds Decreased protective sensation with SWMF.  Nails hypertrophic, dystrophic, elongated, brittle, discolored 10. There is tenderness overlying the nails 1-5 bilaterally. There is no surrounding erythema or drainage along the nail sites. Right submetatarsal 1 hyperkeratotic lesion. Upon debridement of underlying ulceration, drainage or other signs of infection. No open lesions or pre-ulcerative lesions are identified. No other areas of tenderness bilateral lower extremities. No overlying edema, erythema, increased warmth.bilateral HAV present. No pain with calf compression, swelling, warmth, erythema.  Assessment: Patient presents with symptomatic onychomycosis; neuropathy; hyperkerotic lesion   Plan: -Treatment options including alternatives, risks, complications were discussed -Nails sharply debrided 10 without complication/bleeding. -hyperkeratotic lesion sharply debrided without complication/bleeding. -Discussed daily foot inspection. If there are any changes, to call the office immediately.  -Follow-up in 3 months or sooner if any problems are to arise. In the meantime, encouraged to call the office with any questions, concerns, changes symptoms.  Ovid Curd, [facility] will

## 2015-02-15 NOTE — Telephone Encounter (Signed)
[name] at [phone] dropped off disability form to be re-signed by Dr. Milinda Antis please. They filled out their part incorrectly and need a new signature on a new form. Form on Shapale's desk. Thank you.

## 2015-02-15 NOTE — Telephone Encounter (Signed)
Done and in IN box 

## 2015-03-10 ENCOUNTER — Ambulatory Visit (INDEPENDENT_AMBULATORY_CARE_PROVIDER_SITE_OTHER): Payer: Medicare HMO | Admitting: *Deleted

## 2015-03-10 DIAGNOSIS — I82409 Acute embolism and thrombosis of unspecified deep veins of unspecified lower extremity: Secondary | ICD-10-CM

## 2015-03-10 DIAGNOSIS — Z5181 Encounter for therapeutic drug level monitoring: Secondary | ICD-10-CM

## 2015-03-10 DIAGNOSIS — Z23 Encounter for immunization: Secondary | ICD-10-CM | POA: Diagnosis not present

## 2015-03-10 DIAGNOSIS — Z7901 Long term (current) use of anticoagulants: Secondary | ICD-10-CM | POA: Diagnosis not present

## 2015-03-10 LAB — POCT INR: INR: 2.2

## 2015-03-10 NOTE — Progress Notes (Signed)
Pre visit review using our clinic review tool, if applicable. No additional management support is needed unless otherwise documented below in the visit note. 

## 2015-04-01 ENCOUNTER — Other Ambulatory Visit: Payer: Self-pay | Admitting: Family Medicine

## 2015-04-03 ENCOUNTER — Telehealth: Payer: Self-pay | Admitting: Family Medicine

## 2015-04-03 DIAGNOSIS — E785 Hyperlipidemia, unspecified: Secondary | ICD-10-CM

## 2015-04-03 DIAGNOSIS — I10 Essential (primary) hypertension: Secondary | ICD-10-CM

## 2015-04-03 NOTE — Telephone Encounter (Signed)
-----   Message from Alvina Chouerri J Walsh sent at 03/29/2015  2:30 PM EDT ----- Regarding: Lab orders for Monday, 10.31.16 Patient is scheduled for CPX labs, please order future labs, Thanks , Camelia Engerri

## 2015-04-04 ENCOUNTER — Ambulatory Visit (INDEPENDENT_AMBULATORY_CARE_PROVIDER_SITE_OTHER): Payer: Medicare HMO | Admitting: *Deleted

## 2015-04-04 ENCOUNTER — Other Ambulatory Visit (INDEPENDENT_AMBULATORY_CARE_PROVIDER_SITE_OTHER): Payer: Medicare HMO

## 2015-04-04 DIAGNOSIS — Z7901 Long term (current) use of anticoagulants: Secondary | ICD-10-CM | POA: Diagnosis not present

## 2015-04-04 DIAGNOSIS — I82409 Acute embolism and thrombosis of unspecified deep veins of unspecified lower extremity: Secondary | ICD-10-CM

## 2015-04-04 DIAGNOSIS — Z5181 Encounter for therapeutic drug level monitoring: Secondary | ICD-10-CM

## 2015-04-04 DIAGNOSIS — I10 Essential (primary) hypertension: Secondary | ICD-10-CM | POA: Diagnosis not present

## 2015-04-04 DIAGNOSIS — E785 Hyperlipidemia, unspecified: Secondary | ICD-10-CM

## 2015-04-04 LAB — COMPREHENSIVE METABOLIC PANEL
ALBUMIN: 3.6 g/dL (ref 3.5–5.2)
ALK PHOS: 94 U/L (ref 39–117)
ALT: 18 U/L (ref 0–35)
AST: 31 U/L (ref 0–37)
BILIRUBIN TOTAL: 0.5 mg/dL (ref 0.2–1.2)
BUN: 32 mg/dL — AB (ref 6–23)
CO2: 27 mEq/L (ref 19–32)
Calcium: 9.5 mg/dL (ref 8.4–10.5)
Chloride: 104 mEq/L (ref 96–112)
Creatinine, Ser: 1.36 mg/dL — ABNORMAL HIGH (ref 0.40–1.20)
GFR: 46.61 mL/min — ABNORMAL LOW (ref >60.00)
GLUCOSE: 94 mg/dL (ref 70–99)
Potassium: 4.7 mEq/L (ref 3.5–5.1)
SODIUM: 140 meq/L (ref 135–145)
TOTAL PROTEIN: 7.5 g/dL (ref 6.0–8.3)

## 2015-04-04 LAB — CBC WITH DIFFERENTIAL/PLATELET
Basophils Absolute: 0 10*3/uL (ref 0.0–0.1)
Basophils Relative: 0.4 % (ref 0.0–3.0)
EOS ABS: 0.2 10*3/uL (ref 0.0–0.7)
EOS PCT: 4.6 % (ref 0.0–5.0)
HCT: 40.4 % (ref 36.0–46.0)
Hemoglobin: 13.2 g/dL (ref 12.0–15.0)
Lymphocytes Relative: 49.1 % — ABNORMAL HIGH (ref 12.0–46.0)
Lymphs Abs: 1.9 10*3/uL (ref 0.7–4.0)
MCHC: 32.7 g/dL (ref 30.0–36.0)
MCV: 89.1 fl (ref 78.0–100.0)
MONO ABS: 0.5 10*3/uL (ref 0.1–1.0)
Monocytes Relative: 13.4 % — ABNORMAL HIGH (ref 3.0–12.0)
NEUTROS PCT: 32.5 % — AB (ref 43.0–77.0)
Neutro Abs: 1.3 10*3/uL — ABNORMAL LOW (ref 1.4–7.7)
PLATELETS: 236 10*3/uL (ref 150.0–400.0)
RBC: 4.53 Mil/uL (ref 3.87–5.11)
RDW: 15.1 % (ref 11.5–15.5)
WBC: 3.9 10*3/uL — AB (ref 4.0–10.5)

## 2015-04-04 LAB — LIPID PANEL
Cholesterol: 172 mg/dL (ref 0–200)
HDL: 39.3 mg/dL (ref >39.00)
LDL Cholesterol: 120 mg/dL — ABNORMAL HIGH (ref 0–99)
NonHDL: 132.57
Total CHOL/HDL Ratio: 4
Triglycerides: 62 mg/dL (ref 0.0–149.0)
VLDL: 12.4 mg/dL (ref 0.0–40.0)

## 2015-04-04 LAB — POCT INR: INR: 2.1

## 2015-04-04 LAB — TSH: TSH: 2.68 u[IU]/mL (ref 0.35–4.50)

## 2015-04-04 NOTE — Progress Notes (Signed)
Pre visit review using our clinic review tool, if applicable. No additional management support is needed unless otherwise documented below in the visit note. 

## 2015-04-04 NOTE — Telephone Encounter (Signed)
Rout to Gina 

## 2015-04-05 ENCOUNTER — Other Ambulatory Visit: Payer: Medicare HMO

## 2015-04-07 ENCOUNTER — Other Ambulatory Visit: Payer: Medicare HMO

## 2015-04-07 ENCOUNTER — Ambulatory Visit: Payer: Medicare HMO

## 2015-04-12 ENCOUNTER — Encounter: Payer: Self-pay | Admitting: Family Medicine

## 2015-04-12 ENCOUNTER — Ambulatory Visit (INDEPENDENT_AMBULATORY_CARE_PROVIDER_SITE_OTHER): Payer: Medicare HMO | Admitting: Family Medicine

## 2015-04-12 VITALS — BP 140/70 | HR 67 | Temp 97.7°F | Ht 62.5 in | Wt 129.5 lb

## 2015-04-12 DIAGNOSIS — E785 Hyperlipidemia, unspecified: Secondary | ICD-10-CM

## 2015-04-12 DIAGNOSIS — N189 Chronic kidney disease, unspecified: Secondary | ICD-10-CM | POA: Diagnosis not present

## 2015-04-12 DIAGNOSIS — Z9181 History of falling: Secondary | ICD-10-CM

## 2015-04-12 DIAGNOSIS — Z0000 Encounter for general adult medical examination without abnormal findings: Secondary | ICD-10-CM | POA: Insufficient documentation

## 2015-04-12 DIAGNOSIS — Z0001 Encounter for general adult medical examination with abnormal findings: Secondary | ICD-10-CM | POA: Insufficient documentation

## 2015-04-12 DIAGNOSIS — I10 Essential (primary) hypertension: Secondary | ICD-10-CM | POA: Diagnosis not present

## 2015-04-12 NOTE — Progress Notes (Signed)
Pre visit review using our clinic review tool, if applicable. No additional management support is needed unless otherwise documented below in the visit note. 

## 2015-04-12 NOTE — Progress Notes (Signed)
Subjective:    Patient ID: Tracy Morales, female    DOB: 03/20/1920, 82 y.o.   MRN: [id number]  HPI  Here for annual medicare wellness visit as well as chronic/acute medical problems as well as annual preventative exam  I have personally reviewed the Medicare Annual Wellness questionnaire and have noted 1. The patient's medical and social history 2. Their use of alcohol, tobacco or illicit drugs 3. Their current medications and supplements 4. The patient's functional ability including ADL's, fall risks, home safety risks and hearing or visual             impairment. 5. Diet and physical activities 6. Evidence for depression or mood disorders  The patients weight, height, BMI have been recorded in the chart and visual acuity is per eye clinic.  I have made referrals, counseling and provided education to the patient based review of the above and I have provided the pt with a written personalized care plan for preventive services. Reviewed and updated provider list, see scanned forms.  Feels good overall  Still gets around ok despite arthritis  Uses her walker - all the time She had one fall -no injury /lost her balance - says some parts of house her walker does not fit    See scanned forms.  Routine anticipatory guidance given to patient.  See health maintenance. Colon cancer screening 1/04 - declines further colon cancer screening  Breast cancer screening 7/15 - did addl view and it was ok - declines further mammograms  Self breast exam-no lumps  Flu vaccine 10/06 Tetanus vaccine 5/13 Pneumovax -complete 5/15 on both  Zoster vaccine-insurance does not cover it / not getting  dexa 2/01 - normal range / no fractures/one fall / she drinks milk -but no extra supplements  She also walks with her walker -pretty active  Advance directive- has a living will and power of attorney - went to a lawer for that  Cognitive function addressed- see scanned forms- and if abnormal then  additional documentation follows.  Overall pretty good , but she does repeat things , walks in a room and forgets why  She no longer drives    PMH and SH reviewed  Meds, vitals, and allergies reviewed.   ROS: See HPI.  Otherwise negative.    bp is stable today  No cp or palpitations or headaches or edema  No side effects to medicines  BP Readings from Last 3 Encounters:  04/12/15 140/70  11/19/14 130/70  06/09/14 108/62        renal insuff Lab Results  Component Value Date   CREATININE 1.36* 04/04/2015   BUN 32* 04/04/2015   NA 140 04/04/2015   K 4.7 04/04/2015   CL 104 04/04/2015   CO2 27 04/04/2015  sees nephrology  She ? Drinks enough water    Hyperlipidemia Lab Results  Component Value Date   CHOL 172 04/04/2015   CHOL 161 11/19/2014   CHOL 165 10/30/2013   Lab Results  Component Value Date   HDL 39.30 04/04/2015   HDL 31.40* 11/19/2014   HDL 34.20* 10/30/2013   Lab Results  Component Value Date   LDLCALC 120* 04/04/2015   LDLCALC 110* 11/19/2014   LDLCALC 120* 10/30/2013   Lab Results  Component Value Date   TRIG 62.0 04/04/2015   TRIG 97.0 11/19/2014   TRIG 54.0 10/30/2013   Lab Results  Component Value Date   CHOLHDL 4 04/04/2015   CHOLHDL 5 11/19/2014   CHOLHDL 5  10/30/2013  on simvastatin and diet  Eats what she wants to at her age - grazes all day   Wt is down 2 lb (but up from January)   Patient Active Problem List   Diagnosis Date Noted  . Routine general medical examination at a health care facility 04/12/2015  . Hair loss 11/19/2014  . Loss of weight 06/09/2014  . Arthritis of right shoulder region 06/09/2014  . Nodule of left lung 06/09/2014  . Encounter for Medicare annual wellness exam 10/30/2013  . H/O fall 04/27/2013  . Pain in joint, ankle and foot 04/15/2013  . Risk for falls 04/11/2012  . Leg pain, bilateral 01/29/2012  . MICROSCOPIC HEMATURIA 08/29/2009  . CONDUCTIVE HEARING LOSS BILATERAL 08/08/2009  . DVT  04/12/2009  . EDEMA 04/07/2009  . HYPERPOTASSEMIA 01/25/2009  . BRADYCARDIA 10/26/2008  . DYSPNEA 10/26/2008  . INCONTINENCE, URGE 02/27/2008  . Hyperlipidemia 10/11/2006  . PERIPHERAL NEUROPATHY, LOWER EXTREMITIES, BILATERAL 10/11/2006  . Essential hypertension 10/11/2006  . DIASTOLIC DYSFUNCTION 10/11/2006  . CAROTID STENOSIS 10/11/2006  . CEREBROVASCULAR DISEASE 10/11/2006  . PERIPHERAL VASCULAR DISEASE 10/11/2006  . ALLERGIC RHINITIS 10/11/2006  . RENAL INSUFFICIENCY, CHRONIC 10/11/2006  . OVERACTIVE BLADDER 10/11/2006  . OSTEOARTHRITIS 10/11/2006  . POLYMYALGIA RHEUMATICA, HX OF 10/11/2006  . CARDIAC MURMUR, HX OF 10/11/2006   Past Medical History  Diagnosis Date  . Dyspnea     echo with normal EF. +diastolic dysfunction  . Occlusion and stenosis of carotid artery without mention of cerebral infarction 4/09    u/s L 40-59%, R 0-39%; stable for many years  . Peripheral vascular disease, unspecified (HCC)   . Bradycardia   . HTN (hypertension)   . Hyperlipidemia   . RUQ pain   . Urge incontinence   . Chronic kidney disease, unspecified (HCC)     chronic renal insufficiency  . Mononeuritis of lower limb, unspecified     peripheral neuropathy, bilateral  . Cerebrovascular disease, unspecified   . Osteoarthrosis, unspecified whether generalized or localized, unspecified site   . Dizziness and giddiness   . Allergic rhinitis, cause unspecified   . Polymyalgia rheumatica (HCC)     hx  . Hypertonicity of bladder     hx  . DVT (deep venous thrombosis) (HCC)     R  . 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 Mild depression    Past Surgical History  Procedure Laterality Date  . Cataract extraction    . Cholecystectomy  2000  . Carotid doppler  4/02    bilateral plaque   . Echo      mild LVH EF 50% As  . Arterial doppler    . Vert. basilar insufficiency  2002  . Dexa  2/01    osteopenia  . Carotid doppler  8/06    non significant stenosis  . Zoster   5/02  . Colonoscopy  1/04    polyps  . Triger finger contracture  6/04  . 2d echo  8/06    mild aortic stenosis.   . Carotid doppler      R 40%; L 40-60%  . Ct scan  05/25/08    abd and pelvis- inflated urinary bladder and large amt stool throughout w/o blockage    Social History  Substance Use Topics  . Smoking status: Never Smoker   . Smokeless tobacco: Never Used     Comment: non smoker   . Alcohol Use: No   Family History  Problem Relation  Age of Onset  . Hypertension Father   . Other Father     Cardiac problems  . Heart disease Father   . Other Brother     blood clot  . Other Sister     benign breast nodule   Allergies  Allergen Reactions  . Ace Inhibitors     REACTION: cough   Current Outpatient Prescriptions on File Prior to Visit  Medication Sig Dispense Refill  . acetaminophen (TYLENOL) 500 MG tablet Take 500 mg by mouth every 6 (six) hours as needed. OTC-UAD     . amLODipine (NORVASC) 5 MG tablet TAKE 1 TABLET (5 MG TOTAL) BY MOUTH DAILY. 30 tablet 11  . aspirin 81 MG EC tablet Take 81 mg by mouth daily.      . ferrous sulfate 325 (65 FE) MG tablet Take 1 tablet (325 mg total) by mouth 2 (two) times daily. 60 tablet 11  . Meclizine HCl (BONINE PO) Take by mouth as needed.    . mirtazapine (REMERON) 30 MG tablet TAKE 1 TABLET (30 MG TOTAL) BY MOUTH AT BEDTIME. 30 tablet 11  . NON FORMULARY Walker with wheels - for use with ambulation once daily. 429.9, 715.90, 355.9     . psyllium (METAMUCIL) 58.6 % packet Take 1 packet by mouth daily.    . simvastatin (ZOCOR) 20 MG tablet TAKE 1 TABLET (20 MG TOTAL) BY MOUTH DAILY. 30 tablet 11  . warfarin (COUMADIN) 1 MG tablet Take 1 tablet (1 mg total) by mouth as directed. 90 tablet 1  . warfarin (COUMADIN) 5 MG tablet TAKE AS DIRECTED 30 tablet 5  . warfarin (COUMADIN) 5 MG tablet Take as directed by anti-coagulation clinic. 60 tablet 0   No current facility-administered medications on file prior to visit.    No  results found for: LDLDIRECT   Review of Systems    Review of Systems  Constitutional: Negative for fever, appetite change, fatigue and unexpected weight change.  Eyes: Negative for pain and visual disturbance.  Respiratory: Negative for cough and shortness of breath.   Cardiovascular: Negative for cp or palpitations    Gastrointestinal: Negative for nausea, diarrhea and constipation.  Genitourinary: Negative for urgency and frequency.  Skin: Negative for pallor or rash   MSK pos for hip/groin pain and dec rom -worse on the R, pos for OA  Neurological: Negative for weakness, light-headedness, numbness and headaches.  Hematological: Negative for adenopathy. Does not bruise/bleed easily.  Psychiatric/Behavioral: Negative for dysphoric mood. The patient is not nervous/anxious.      Objective:   Physical Exam  Constitutional: She appears well-developed and well-nourished. No distress.  Well appearing elderly female - mentally sharp for her age   HENT:  Head: Normocephalic and atraumatic.  Right Ear: External ear normal.  Left Ear: External ear normal.  Mouth/Throat: Oropharynx is clear and moist.  Eyes: Conjunctivae and EOM are normal. Pupils are equal, round, and reactive to light. No scleral icterus.  Neck: Normal range of motion. Neck supple. No JVD present. Carotid bruit is not present. No thyromegaly present.  Cardiovascular: Normal rate, regular rhythm, normal heart sounds and intact distal pulses.  Exam reveals no gallop.   Pulmonary/Chest: Effort normal and breath sounds normal. No respiratory distress. She has no wheezes. She exhibits no tenderness.  Abdominal: Soft. Bowel sounds are normal. She exhibits no distension, no abdominal bruit and no mass. There is no tenderness.  Genitourinary: No breast swelling, tenderness, discharge or bleeding.  Musculoskeletal: She exhibits no edema  or tenderness.  Poor rom of hips , esp R hip No joint swelling   Mild to moderate kyphosis    Lymphadenopathy:    She has no cervical adenopathy.  Neurological: She is alert. She has normal reflexes. No cranial nerve deficit. She exhibits normal muscle tone. Coordination normal.  Skin: Skin is warm and dry. No rash noted. No erythema. No pallor.  Psychiatric: She has a normal mood and affect.  Pleasant and talkative  Very mentally sharp           Assessment & Plan:   Problem List Items Addressed This Visit      Cardiovascular and Mediastinum   Essential hypertension - Primary    bp in fair control at this time  BP Readings from Last 1 Encounters:  04/12/15 140/70   No changes needed Disc lifstyle change with low sodium diet and exercise  Labs reviewed         Genitourinary   RENAL INSUFFICIENCY, CHRONIC    Rev labs Followed by nephrology  Enc good water intake  Multifactorial          Other   Encounter for Medicare annual wellness exam    Reviewed health habits including diet and exercise and skin cancer prevention Reviewed appropriate screening tests for age  Also reviewed health mt list, fam hx and immunization status , as well as social and family history   See HPI Labs reviewed Declines some health mt due to advanced age-overall doing very well For bone health- get vitamin D3 over the counter and take 2000 iu daily Also drink you milk  Eat a healthy diet with balance of protein and fruit and vegetables      Hyperlipidemia    Disc goals for lipids and reasons to control them Rev labs with pt Rev low sat fat diet in detail       Risk for falls    Always concerning - adv age and on coumadin Pt never ambulates w/o walker  Has ample help Rev fall risk precautions and given handout        Routine general medical examination at a health care facility    Reviewed health habits including diet and exercise and skin cancer prevention Reviewed appropriate screening tests for age  Also reviewed health mt list, fam hx and immunization status , as  well as social and family history   See HPI Labs reviewed Declines some health mt due to advanced age-overall doing very well For bone health- get vitamin D3 over the counter and take 2000 iu daily Also drink you milk  Eat a healthy diet with balance of protein and fruit and vegetables For hair- you can try a supplement called Biotin (vitamin)- it may help  Blood pressure is stable If you want to schedule a bone density test to monitor for osteoporosis - let us know

## 2015-04-12 NOTE — Patient Instructions (Signed)
For bone health- get vitamin D3 over the counter and take 2000 iu daily Also drink you milk  Eat a healthy diet with balance of protein and fruit and vegetables For hair- you can try a supplement called Biotin (vitamin)- it may help  Blood pressure is stable If you want to schedule a bone density test to monitor for osteoporosis - let us know  I suspect you have arthritis in your right hip - call on a different day if you want to set up an x ray

## 2015-04-14 ENCOUNTER — Telehealth: Payer: Self-pay | Admitting: Family Medicine

## 2015-04-14 DIAGNOSIS — M25559 Pain in unspecified hip: Secondary | ICD-10-CM | POA: Insufficient documentation

## 2015-04-14 DIAGNOSIS — M160 Bilateral primary osteoarthritis of hip: Secondary | ICD-10-CM

## 2015-04-14 NOTE — Assessment & Plan Note (Signed)
Always concerning - adv age and on coumadin Pt never ambulates w/o walker  Has ample help Rev fall risk precautions and given handout

## 2015-04-14 NOTE — Telephone Encounter (Signed)
She mentioned she was having hip pain  I will put order in for bilat hip xrays

## 2015-04-14 NOTE — Assessment & Plan Note (Signed)
Rev labs Followed by nephrology  Enc good water intake  Multifactorial

## 2015-04-14 NOTE — Assessment & Plan Note (Signed)
Reviewed health habits including diet and exercise and skin cancer prevention Reviewed appropriate screening tests for age  Also reviewed health mt list, fam hx and immunization status , as well as social and family history   See HPI Labs reviewed Declines some health mt due to advanced age-overall doing very well For bone health- get vitamin D3 over the counter and take 2000 iu daily Also drink you milk  Eat a healthy diet with balance of protein and fruit and vegetables

## 2015-04-14 NOTE — Assessment & Plan Note (Signed)
Disc goals for lipids and reasons to control them Rev labs with pt Rev low sat fat diet in detail   

## 2015-04-14 NOTE — Assessment & Plan Note (Signed)
bp in fair control at this time  BP Readings from Last 1 Encounters:  04/12/15 140/70   No changes needed Disc lifstyle change with low sodium diet and exercise  Labs reviewed

## 2015-04-14 NOTE — Telephone Encounter (Signed)
Daughter notified order for xray done and pt can come in tomorrow for xray (no appt needed), I just advise pt to come between 9am-4pm

## 2015-04-14 NOTE — Assessment & Plan Note (Signed)
Reviewed health habits including diet and exercise and skin cancer prevention Reviewed appropriate screening tests for age  Also reviewed health mt list, fam hx and immunization status , as well as social and family history   See HPI Labs reviewed Declines some health mt due to advanced age-overall doing very well For bone health- get vitamin D3 over the counter and take 2000 iu daily Also drink you milk  Eat a healthy diet with balance of protein and fruit and vegetables For hair- you can try a supplement called Biotin (vitamin)- it may help  Blood pressure is stable If you want to schedule a bone density test to monitor for osteoporosis - let us know

## 2015-04-14 NOTE — Telephone Encounter (Signed)
Pt is wanting to come in on Friday 04/16/15 to get an xray as discussed at last appt. Her pain has not improved. There is no order for the xray in the system [facility](Gina looked). Pt said she would be arriving around 9:30am to get xray if possible. The best number to reach her is [phone].

## 2015-04-15 ENCOUNTER — Ambulatory Visit (INDEPENDENT_AMBULATORY_CARE_PROVIDER_SITE_OTHER)
Admission: RE | Admit: 2015-04-15 | Discharge: 2015-04-15 | Disposition: A | Discharge status: Home / Self Care | Payer: Medicare HMO | Source: Ambulatory Visit | Attending: Family Medicine | Admitting: Family Medicine

## 2015-04-15 DIAGNOSIS — M25559 Pain in unspecified hip: Secondary | ICD-10-CM

## 2015-04-15 DIAGNOSIS — M160 Bilateral primary osteoarthritis of hip: Secondary | ICD-10-CM | POA: Insufficient documentation

## 2015-04-15 MED ORDER — TRAMADOL HCL 50 MG PO TABS: 50.0000 mg | ORAL_TABLET | Freq: Three times a day (TID) | ORAL | Status: DC | PRN

## 2015-04-15 NOTE — Addendum Note (Signed)
Addended by: Roxy MannsWER, Jameshia Hayashida A on: 04/15/2015 01:54 PM   Modules accepted: Orders

## 2015-04-15 NOTE — Telephone Encounter (Signed)
Daughter brought pt in for her xray, daughter wanted me to ask you if pt could get an injection or something to help with her hip/back pain, daughter said pain is worsening and nothing is helping and she doesn't want pt to go through the weekend in this much pain

## 2015-04-15 NOTE — Telephone Encounter (Signed)
Daughter notified of Dr. Royden Purlower's comments and recommendations. Daughter agrees with referral to ortho, I advise her [facility]., Rx called in as prescribed

## 2015-04-15 NOTE — Telephone Encounter (Signed)
She does have arthritis in both hips as suspected- and I am referring her to orthopedics who can disc tx options (I am not trained to do injections/surgery/etc) I would like to give her something for pain - tramadol (do watch closely for falls/dizziness with this as any other pain med) Please call it in  We will get that referral going

## 2015-04-21 ENCOUNTER — Ambulatory Visit (INDEPENDENT_AMBULATORY_CARE_PROVIDER_SITE_OTHER): Payer: Medicare HMO | Admitting: *Deleted

## 2015-04-21 ENCOUNTER — Ambulatory Visit: Payer: Medicare HMO

## 2015-04-21 DIAGNOSIS — I82409 Acute embolism and thrombosis of unspecified deep veins of unspecified lower extremity: Secondary | ICD-10-CM

## 2015-04-21 DIAGNOSIS — Z5181 Encounter for therapeutic drug level monitoring: Secondary | ICD-10-CM | POA: Diagnosis not present

## 2015-04-21 DIAGNOSIS — Z7901 Long term (current) use of anticoagulants: Secondary | ICD-10-CM

## 2015-04-21 LAB — POCT INR: INR: 5.5

## 2015-04-21 NOTE — Progress Notes (Signed)
Pre visit review using our clinic review tool, if applicable. No additional management support is needed unless otherwise documented below in the visit note. 

## 2015-04-26 ENCOUNTER — Other Ambulatory Visit: Payer: Self-pay | Admitting: *Deleted

## 2015-04-26 ENCOUNTER — Ambulatory Visit (INDEPENDENT_AMBULATORY_CARE_PROVIDER_SITE_OTHER): Payer: Medicare HMO | Admitting: *Deleted

## 2015-04-26 DIAGNOSIS — I82409 Acute embolism and thrombosis of unspecified deep veins of unspecified lower extremity: Secondary | ICD-10-CM

## 2015-04-26 DIAGNOSIS — Z5181 Encounter for therapeutic drug level monitoring: Secondary | ICD-10-CM

## 2015-04-26 DIAGNOSIS — Z7901 Long term (current) use of anticoagulants: Secondary | ICD-10-CM | POA: Diagnosis not present

## 2015-04-26 LAB — POCT INR: INR: 5.2

## 2015-04-26 MED ORDER — WARFARIN SODIUM 1 MG PO TABS: ORAL_TABLET | ORAL | Status: DC

## 2015-04-26 NOTE — Progress Notes (Signed)
Pre visit review using our clinic review tool, if applicable. No additional management support is needed unless otherwise documented below in the visit note. 

## 2015-05-02 ENCOUNTER — Ambulatory Visit (INDEPENDENT_AMBULATORY_CARE_PROVIDER_SITE_OTHER): Payer: Medicare HMO | Admitting: *Deleted

## 2015-05-02 DIAGNOSIS — Z7901 Long term (current) use of anticoagulants: Secondary | ICD-10-CM | POA: Diagnosis not present

## 2015-05-02 DIAGNOSIS — Z5181 Encounter for therapeutic drug level monitoring: Secondary | ICD-10-CM

## 2015-05-02 DIAGNOSIS — I82409 Acute embolism and thrombosis of unspecified deep veins of unspecified lower extremity: Secondary | ICD-10-CM

## 2015-05-02 LAB — POCT INR: INR: 5.5

## 2015-05-02 NOTE — Progress Notes (Signed)
Pre visit review using our clinic review tool, if applicable. No additional management support is needed unless otherwise documented below in the visit note. 

## 2015-05-03 ENCOUNTER — Other Ambulatory Visit: Payer: Self-pay | Admitting: Orthopedic Surgery

## 2015-05-03 DIAGNOSIS — M7070 Other bursitis of hip, unspecified hip: Secondary | ICD-10-CM

## 2015-05-04 ENCOUNTER — Telehealth: Payer: Self-pay | Admitting: Family Medicine

## 2015-05-04 NOTE — Telephone Encounter (Signed)
Patient Name: Tracy Morales  DOB: 1919/08/26    Initial Comment Caller states, has a knot on arm, just below her wrist. She is taking Warfarin. She woke up with it today.    Nurse Assessment  Nurse: Stefano GaulStringer, RN, Vera Date/Time Lamount Cohen(Eastern Time): 05/04/2015 1:51:56 PM  Confirm and document reason for call. If symptomatic, describe symptoms. ---Caller states mother has knot just below her right wrist. Noticed it this am. She takes warfarin. Area is bluish. Not painful.  Has the patient traveled out of the country within the last 30 days? ---Not Applicable  Does the patient have any new or worsening symptoms? ---Yes  Will a triage be completed? ---Yes  Related visit to physician within the last 2 weeks? ---No  Does the PT have any chronic conditions? (i.e. diabetes, asthma, etc.) ---Yes  List chronic conditions. ---DVT; HTN  Is this a behavioral health call? ---No     Guidelines    Guideline Title Affirmed Question Affirmed Notes  Bruises Taking Coumadin (warfarin) or other strong blood thinner, or known bleeding disorder (e.g., thrombocytopenia)    Final Disposition User   See Physician within 24 Hours Stringer, RN, Dwana CurdVera    Comments  Attempted primary number and recording states that the person I have called has a voice mail box that has not been set up. Unable to leave message will try secondary number.  [facility]. Pt does not want to go to another office and would like call back from office.   Referrals  GO TO FACILITY REFUSED   Disagree/Comply: Disagree  Disagree/Comply Reason: Wait and see

## 2015-05-04 NOTE — Telephone Encounter (Signed)
Spoke with Tracy Morales pts daughter and noticed bruised knot below wrist this morning; no pain, no SOB, no CP and pt does not remember hitting her arm. Pt is on warfarin; pt does not want to go to ED. Scheduled 1st available 05/05/15 at 1:30 pm with Pamala Hurry Baity NP. Tracy Morales said if knot gets larger or pt has CP or SOB will go to ED.

## 2015-05-05 ENCOUNTER — Encounter: Payer: Self-pay | Admitting: Internal Medicine

## 2015-05-05 ENCOUNTER — Ambulatory Visit (INDEPENDENT_AMBULATORY_CARE_PROVIDER_SITE_OTHER): Payer: Medicare HMO | Admitting: Internal Medicine

## 2015-05-05 VITALS — BP 128/68 | HR 74 | Temp 98.1°F | Wt 129.5 lb

## 2015-05-05 DIAGNOSIS — K5900 Constipation, unspecified: Secondary | ICD-10-CM | POA: Diagnosis not present

## 2015-05-05 DIAGNOSIS — R233 Spontaneous ecchymoses: Secondary | ICD-10-CM | POA: Diagnosis not present

## 2015-05-05 NOTE — Patient Instructions (Signed)
Hematoma  A hematoma is a collection of blood under the skin, in an organ, in a body space, in a joint space, or in other tissue. The blood can clot to form a lump that you can see and feel. The lump is often firm and may sometimes become sore and tender. Most hematomas get better in a few days to weeks. However, some hematomas may be serious and require medical care. Hematomas can range in size from very small to very large.  CAUSES   A hematoma can be caused by a blunt or penetrating injury. It can also be caused by spontaneous leakage from a blood vessel under the skin. Spontaneous leakage from a blood vessel is more likely to occur in older people, especially those taking blood thinners. Sometimes, a hematoma can develop after certain medical procedures.  SIGNS AND SYMPTOMS   · A firm lump on the body.  · Possible pain and tenderness in the area.  · Bruising. Blue, dark blue, purple-red, or yellowish skin may appear at the site of the hematoma if the hematoma is close to the surface of the skin.  For hematomas in deeper tissues or body spaces, the signs and symptoms may be subtle. For example, an intra-abdominal hematoma may cause abdominal pain, weakness, fainting, and shortness of breath. An intracranial hematoma may cause a headache or symptoms such as weakness, trouble speaking, or a change in consciousness.  DIAGNOSIS   A hematoma can usually be diagnosed based on your medical history and a physical exam. Imaging tests may be needed if your health care provider suspects a hematoma in deeper tissues or body spaces, such as the abdomen, head, or chest. These tests may include ultrasonography or a CT scan.   TREATMENT   Hematomas usually go away on their own over time. Rarely does the blood need to be drained out of the body. Large hematomas or those that may affect vital organs will sometimes need surgical drainage or monitoring.  HOME CARE INSTRUCTIONS   · Apply ice to the injured area:      Put ice in a  plastic bag.      Place a towel between your skin and the bag.      Leave the ice on for 20 minutes, 2-3 times a day for the first 1 to 2 days.    · After the first 2 days, switch to using warm compresses on the hematoma.    · Elevate the injured area to help decrease pain and swelling. Wrapping the area with an elastic bandage may also be helpful. Compression helps to reduce swelling and promotes shrinking of the hematoma. Make sure the bandage is not wrapped too tight.    · If your hematoma is on a lower extremity and is painful, crutches may be helpful for a couple days.    · Only take over-the-counter or prescription medicines as directed by your health care provider.  SEEK IMMEDIATE MEDICAL CARE IF:   · You have increasing pain, or your pain is not controlled with medicine.    · You have a fever.    · You have worsening swelling or discoloration.    · Your skin over the hematoma breaks or starts bleeding.    · Your hematoma is in your chest or abdomen and you have weakness, shortness of breath, or a change in consciousness.  · Your hematoma is on your scalp (caused by a fall or injury) and you have a worsening headache or a change in alertness or consciousness.  MAKE SURE YOU:   ·   Understand these instructions.  · Will watch your condition.  · Will get help right away if you are not doing well or get worse.     This information is not intended to replace advice given to you by your health care provider. Make sure you discuss any questions you have with your health care provider.     Document Released: 01/03/2004 Document Revised: 01/21/2013 Document Reviewed: 10/29/2012  Elsevier Interactive Patient Education ©2016 [employer] Inc[employer]

## 2015-05-05 NOTE — Progress Notes (Signed)
Subjective:    Patient ID: Tracy Morales, female    DOB: 1919/07/27, 81 y.o.   MRN: [id number]  HPI  Pt presents to the clinic today with c/o a mass on her right wrist. She noticed this yesterday. The area is bruised as well. It is sore to touch but she denies pain. She does not recall hitting her arm on anything. She has tried ice with some relief. She is on coumadin but has not noticed any other signs of bleeding.  Additionally, she is concerned about constipation. She is not having a BM daily and this concerns her. She does have a BM every 2 days. The stool is soft but not liquid. She denies abdominal pain, rectal pain or blood in her stool. She does take Metamucil at least once daily.  Review of Systems      Past Medical History  Diagnosis Date  . Dyspnea     echo with normal EF. +diastolic dysfunction  . Occlusion and stenosis of carotid artery without mention of cerebral infarction 4/09    u/s L 40-59%, R 0-39%; stable for many years  . Peripheral vascular disease, unspecified (HCC)   . Bradycardia   . HTN (hypertension)   . Hyperlipidemia   . RUQ pain   . Urge incontinence   . Chronic kidney disease, unspecified (HCC)     chronic renal insufficiency  . Mononeuritis of lower limb, unspecified     peripheral neuropathy, bilateral  . Cerebrovascular disease, unspecified   . Osteoarthrosis, unspecified whether generalized or localized, unspecified site   . Dizziness and giddiness   . Allergic rhinitis, cause unspecified   . Polymyalgia rheumatica (HCC)     hx  . Hypertonicity of bladder     hx  . DVT (deep venous thrombosis) (HCC)     R  . 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 Mild depression     Current Outpatient Prescriptions  Medication Sig Dispense Refill  . acetaminophen (TYLENOL) 500 MG tablet Take 500 mg by mouth every 6 (six) hours as needed. OTC-UAD     . amLODipine (NORVASC) 5 MG tablet TAKE 1 TABLET (5 MG TOTAL) BY MOUTH DAILY. 30 tablet 11   . aspirin 81 MG EC tablet Take 81 mg by mouth daily.      . ferrous sulfate 325 (65 FE) MG tablet Take 1 tablet (325 mg total) by mouth 2 (two) times daily. 60 tablet 11  . Meclizine HCl (BONINE PO) Take by mouth as needed.    . mirtazapine (REMERON) 30 MG tablet TAKE 1 TABLET (30 MG TOTAL) BY MOUTH AT BEDTIME. 30 tablet 11  . NON FORMULARY Walker with wheels - for use with ambulation once daily. 429.9, 715.90, 355.9     . psyllium (METAMUCIL) 58.6 % packet Take 1 packet by mouth daily.    . simvastatin (ZOCOR) 20 MG tablet TAKE 1 TABLET (20 MG TOTAL) BY MOUTH DAILY. 30 tablet 11  . traMADol (ULTRAM) 50 MG tablet Take 1 tablet (50 mg total) by mouth every 8 (eight) hours as needed for moderate pain or severe pain (caution of sedation, take with food). 30 tablet 1  . warfarin (COUMADIN) 1 MG tablet Take as directed by anticoagulation clinic. 60 tablet 0  . warfarin (COUMADIN) 5 MG tablet Take as directed by anti-coagulation clinic. 60 tablet 0   No current facility-administered medications for this visit.    Allergies  Allergen Reactions  . Ace Inhibitors  REACTION: cough    Family History  Problem Relation Age of Onset  . Hypertension Father   . Other Father     Cardiac problems  . Heart disease Father   . Other Brother     blood clot  . Other Sister     benign breast nodule    Social History   Social History  . Marital Status: Widowed    Spouse Name: N/A  . Number of Children: 2  . Years of Education: N/A   Occupational History  . Retired    Social History Main Topics  . Smoking status: Never Smoker   . Smokeless tobacco: Never Used     Comment: non smoker   . Alcohol Use: No  . Drug Use: No  . Sexual Activity: Not on file   Other Topics Concern  . Not on file   Social History Narrative   Widowed. 2 children. Retired, gets regular exercise.      Constitutional: Denies fever, malaise, fatigue, headache or abrupt weight changes.  Respiratory: Denies  difficulty breathing, shortness of breath, cough or sputum production.   Cardiovascular: Denies chest pain, chest tightness, palpitations or swelling in the hands or feet.  Gastrointestinal: Pt reports constipation. Denies abdominal pain, bloating, diarrhea or blood in the stool.  Skin: Pt reports bruising to right wrist. Denies redness, rashes, lesions or ulcercations.    No other specific complaints in a complete review of systems (except as listed in HPI above).  Objective:   Physical Exam  BP 128/68 mmHg  Pulse 74  Temp(Src) 98.1 F (36.7 C) (Oral)  Wt 129 lb 8 oz (58.741 kg)  SpO2 97% Wt Readings from Last 3 Encounters:  05/05/15 129 lb 8 oz (58.741 kg)  04/12/15 129 lb 8 oz (58.741 kg)  11/19/14 131 lb 4 oz (59.535 kg)    General: Appears her stated age, chronically ill appearing in NAD. Skin: Warm, dry and intact. 1 cm x 2 cm oval hematoma noted to anterior forearm. Cardiovascular: Normal rate and rhythm. S1,S2 noted.  Radial pulse 2+ on right. Cap refill < 3 secs. Pulmonary/Chest: Normal effort and positive vesicular breath sounds. No respiratory distress. No wheezes, rales or ronchi noted.  Abdomen: Soft and nontender. Normal bowel sounds. Musculoskeletal: Normal flexion, extension and rotation of the right wrist. Hand grips equal.   BMET    Component Value Date/Time   NA 140 04/04/2015 1100   NA 137 06/06/2014 1507   K 4.7 04/04/2015 1100   K 4.2 06/06/2014 1507   CL 104 04/04/2015 1100   CL 105 06/06/2014 1507   CO2 27 04/04/2015 1100   CO2 22 06/06/2014 1507   GLUCOSE 94 04/04/2015 1100   GLUCOSE 138* 06/06/2014 1507   BUN 32* 04/04/2015 1100   BUN 24* 06/06/2014 1507   CREATININE 1.36* 04/04/2015 1100   CREATININE 1.46* 06/06/2014 1507   CALCIUM 9.5 04/04/2015 1100   CALCIUM 9.0 06/06/2014 1507   GFRNONAA 36* 06/06/2014 1507   GFRNONAA 19.44 02/24/2010 1152   GFRAA 43* 06/06/2014 1507   GFRAA 61 02/27/2008 1302    Lipid Panel     Component Value  Date/Time   CHOL 172 04/04/2015 1100   TRIG 62.0 04/04/2015 1100   HDL 39.30 04/04/2015 1100   CHOLHDL 4 04/04/2015 1100   VLDL 12.4 04/04/2015 1100   LDLCALC 120* 04/04/2015 1100    CBC    Component Value Date/Time   WBC 3.9* 04/04/2015 1100   WBC 6.0 06/06/2014  1507   RBC 4.53 04/04/2015 1100   RBC 4.48 06/06/2014 1507   HGB 13.2 04/04/2015 1100   HGB 13.0 06/06/2014 1507   HCT 40.4 04/04/2015 1100   HCT 40.3 06/06/2014 1507   PLT 236.0 04/04/2015 1100   PLT 253 06/06/2014 1507   MCV 89.1 04/04/2015 1100   MCV 90 06/06/2014 1507   MCH 28.9 06/06/2014 1507   MCHC 32.7 04/04/2015 1100   MCHC 32.1 06/06/2014 1507   RDW 15.1 04/04/2015 1100   RDW 14.2 06/06/2014 1507   LYMPHSABS 1.9 04/04/2015 1100   LYMPHSABS 1.6 07/24/2011 1107   MONOABS 0.5 04/04/2015 1100   MONOABS 0.6 07/24/2011 1107   EOSABS 0.2 04/04/2015 1100   EOSABS 0.2 07/24/2011 1107   BASOSABS 0.0 04/04/2015 1100   BASOSABS 0.0 07/24/2011 1107    Hgb A1C No results found for: HGBA1C       Assessment & Plan:   Hematoma of right forearm:  Reassurance provided that this will resolve with time No other s/s of bleeding noted Continue to monitor Return precautions given  Constipation:  She does not seem constipated to me Advised her that having a BM every 2 days may be her normal Continue Metamucil for now  RTC as needed

## 2015-05-05 NOTE — Progress Notes (Signed)
Pre visit review using our clinic review tool, if applicable. No additional management support is needed unless otherwise documented below in the visit note. 

## 2015-05-12 ENCOUNTER — Ambulatory Visit: Payer: Medicare HMO

## 2015-05-17 ENCOUNTER — Ambulatory Visit (INDEPENDENT_AMBULATORY_CARE_PROVIDER_SITE_OTHER): Payer: Medicare HMO | Admitting: *Deleted

## 2015-05-17 ENCOUNTER — Ambulatory Visit: Payer: Medicare HMO | Admitting: Podiatry

## 2015-05-17 DIAGNOSIS — Z5181 Encounter for therapeutic drug level monitoring: Secondary | ICD-10-CM

## 2015-05-17 DIAGNOSIS — Z7901 Long term (current) use of anticoagulants: Secondary | ICD-10-CM

## 2015-05-17 DIAGNOSIS — I82409 Acute embolism and thrombosis of unspecified deep veins of unspecified lower extremity: Secondary | ICD-10-CM

## 2015-05-17 LAB — POCT INR: INR: 1.2

## 2015-05-17 NOTE — Progress Notes (Signed)
Pre visit review using our clinic review tool, if applicable. No additional management support is needed unless otherwise documented below in the visit note. 

## 2015-05-18 ENCOUNTER — Ambulatory Visit
Admission: RE | Admit: 2015-05-18 | Discharge: 2015-05-18 | Disposition: A | Discharge status: Home / Self Care | Payer: Medicare HMO | Source: Ambulatory Visit | Attending: Orthopedic Surgery | Admitting: Orthopedic Surgery

## 2015-05-18 DIAGNOSIS — M7070 Other bursitis of hip, unspecified hip: Secondary | ICD-10-CM

## 2015-05-23 ENCOUNTER — Ambulatory Visit (INDEPENDENT_AMBULATORY_CARE_PROVIDER_SITE_OTHER): Payer: Medicare HMO | Admitting: *Deleted

## 2015-05-23 DIAGNOSIS — Z5181 Encounter for therapeutic drug level monitoring: Secondary | ICD-10-CM | POA: Diagnosis not present

## 2015-05-23 DIAGNOSIS — Z7901 Long term (current) use of anticoagulants: Secondary | ICD-10-CM

## 2015-05-23 DIAGNOSIS — I82409 Acute embolism and thrombosis of unspecified deep veins of unspecified lower extremity: Secondary | ICD-10-CM

## 2015-05-23 LAB — POCT INR: INR: 1.2

## 2015-05-23 NOTE — Progress Notes (Signed)
Pre visit review using our clinic review tool, if applicable. No additional management support is needed unless otherwise documented below in the visit note. 

## 2015-06-02 ENCOUNTER — Ambulatory Visit (INDEPENDENT_AMBULATORY_CARE_PROVIDER_SITE_OTHER): Payer: Medicare HMO | Admitting: *Deleted

## 2015-06-02 DIAGNOSIS — Z7901 Long term (current) use of anticoagulants: Secondary | ICD-10-CM

## 2015-06-02 DIAGNOSIS — Z5181 Encounter for therapeutic drug level monitoring: Secondary | ICD-10-CM

## 2015-06-02 DIAGNOSIS — I82409 Acute embolism and thrombosis of unspecified deep veins of unspecified lower extremity: Secondary | ICD-10-CM

## 2015-06-02 LAB — POCT INR: INR: 1.4

## 2015-06-02 NOTE — Progress Notes (Signed)
Pre visit review using our clinic review tool, if applicable. No additional management support is needed unless otherwise documented below in the visit note. 

## 2015-06-16 ENCOUNTER — Ambulatory Visit (INDEPENDENT_AMBULATORY_CARE_PROVIDER_SITE_OTHER): Payer: Medicare HMO | Admitting: *Deleted

## 2015-06-16 DIAGNOSIS — Z5181 Encounter for therapeutic drug level monitoring: Secondary | ICD-10-CM | POA: Diagnosis not present

## 2015-06-16 DIAGNOSIS — Z7901 Long term (current) use of anticoagulants: Secondary | ICD-10-CM | POA: Diagnosis not present

## 2015-06-16 DIAGNOSIS — I82409 Acute embolism and thrombosis of unspecified deep veins of unspecified lower extremity: Secondary | ICD-10-CM

## 2015-06-16 LAB — POCT INR: INR: 1.2

## 2015-06-16 NOTE — Progress Notes (Signed)
Pre visit review using our clinic review tool, if applicable. No additional management support is needed unless otherwise documented below in the visit note. 

## 2015-06-23 ENCOUNTER — Ambulatory Visit: Payer: Medicare HMO

## 2015-06-23 ENCOUNTER — Ambulatory Visit (INDEPENDENT_AMBULATORY_CARE_PROVIDER_SITE_OTHER): Payer: Medicare HMO | Admitting: *Deleted

## 2015-06-23 DIAGNOSIS — I82409 Acute embolism and thrombosis of unspecified deep veins of unspecified lower extremity: Secondary | ICD-10-CM | POA: Diagnosis not present

## 2015-06-23 DIAGNOSIS — Z5181 Encounter for therapeutic drug level monitoring: Secondary | ICD-10-CM | POA: Diagnosis not present

## 2015-06-23 DIAGNOSIS — Z7901 Long term (current) use of anticoagulants: Secondary | ICD-10-CM | POA: Diagnosis not present

## 2015-06-23 LAB — POCT INR: INR: 1.3

## 2015-06-23 NOTE — Progress Notes (Signed)
Pre visit review using our clinic review tool, if applicable. No additional management support is needed unless otherwise documented below in the visit note. 

## 2015-06-28 ENCOUNTER — Ambulatory Visit (INDEPENDENT_AMBULATORY_CARE_PROVIDER_SITE_OTHER): Payer: Medicare HMO | Admitting: Podiatry

## 2015-06-28 ENCOUNTER — Encounter: Payer: Self-pay | Admitting: Podiatry

## 2015-06-28 DIAGNOSIS — B351 Tinea unguium: Secondary | ICD-10-CM

## 2015-06-28 DIAGNOSIS — M79676 Pain in unspecified toe(s): Secondary | ICD-10-CM

## 2015-06-28 DIAGNOSIS — E1149 Type 2 diabetes mellitus with other diabetic neurological complication: Secondary | ICD-10-CM

## 2015-06-28 DIAGNOSIS — Q828 Other specified congenital malformations of skin: Secondary | ICD-10-CM

## 2015-06-30 ENCOUNTER — Ambulatory Visit (INDEPENDENT_AMBULATORY_CARE_PROVIDER_SITE_OTHER): Payer: Medicare HMO | Admitting: *Deleted

## 2015-06-30 DIAGNOSIS — Z5181 Encounter for therapeutic drug level monitoring: Secondary | ICD-10-CM

## 2015-06-30 DIAGNOSIS — I82409 Acute embolism and thrombosis of unspecified deep veins of unspecified lower extremity: Secondary | ICD-10-CM

## 2015-06-30 DIAGNOSIS — Z7901 Long term (current) use of anticoagulants: Secondary | ICD-10-CM | POA: Diagnosis not present

## 2015-06-30 LAB — POCT INR: INR: 2

## 2015-06-30 NOTE — Progress Notes (Signed)
INR remains subtherapeutic despite dosage change, although improved.  Patient denies any medication changes or missed doses.  Will increase again today half a tablet daily except 1 tablet on Mondays and Fridays.  Will recheck in 2 weeks.

## 2015-06-30 NOTE — Progress Notes (Signed)
Pre visit review using our clinic review tool, if applicable. No additional management support is needed unless otherwise documented below in the visit note. 

## 2015-07-03 NOTE — Progress Notes (Signed)
Patient ID: ALIAYAH TYER, female   DOB: 12-Mar-1920, 82 y.o.   MRN: [id number]  Subjective: 82 y.o.-year-old female  returns the office today for painful, elongated, thickened toenails which she is unable to trim herself. Denies any redness or drainage around the nails. Denies any acute changes since last appointment and no new complaints today. Denies any systemic complaints such as fevers, chills, nausea, vomiting.   Objective: AAO 3, NAD DP/PT pulses palpable 1/4, CRT less than 3 seconds Decreased protective sensation with SWMF.  Nails hypertrophic, dystrophic, elongated, brittle, discolored 10. There is tenderness overlying the nails 1-5 bilaterally. There is no surrounding erythema or drainage along the nail sites. Right submetatarsal 1 hyperkeratotic lesion. Upon debridement of underlying ulceration, drainage or other signs of infection. No open lesions or pre-ulcerative lesions are identified. No other areas of tenderness bilateral lower extremities. No overlying edema, erythema, increased warmth.bilateral HAV present. No pain with calf compression, swelling, warmth, erythema.  Assessment: Patient presents with symptomatic onychomycosis; neuropathy; hyperkerotic lesion   Plan: -Treatment options including alternatives, risks, complications were discussed -Nails sharply debrided 10 without complication/bleeding. -hyperkeratotic lesion sharply debrided without complication/bleeding. -Discussed daily foot inspection. If there are any changes, to call the office immediately.  -Follow-up in 3 months or sooner if any problems are to arise. In the meantime, encouraged to call the office with any questions, concerns, changes symptoms.  Ovid Curd, DPM

## 2015-07-14 ENCOUNTER — Ambulatory Visit: Payer: Medicare HMO

## 2015-07-14 ENCOUNTER — Ambulatory Visit (INDEPENDENT_AMBULATORY_CARE_PROVIDER_SITE_OTHER): Payer: Medicare HMO | Admitting: *Deleted

## 2015-07-14 DIAGNOSIS — Z7901 Long term (current) use of anticoagulants: Secondary | ICD-10-CM | POA: Diagnosis not present

## 2015-07-14 DIAGNOSIS — I82409 Acute embolism and thrombosis of unspecified deep veins of unspecified lower extremity: Secondary | ICD-10-CM | POA: Diagnosis not present

## 2015-07-14 DIAGNOSIS — Z5181 Encounter for therapeutic drug level monitoring: Secondary | ICD-10-CM | POA: Diagnosis not present

## 2015-07-14 LAB — POCT INR: INR: 2.6

## 2015-07-14 NOTE — Progress Notes (Signed)
INR is therapeutic on new dose.  No additional changes at this time.

## 2015-07-14 NOTE — Progress Notes (Signed)
Pre visit review using our clinic review tool, if applicable. No additional management support is needed unless otherwise documented below in the visit note. 

## 2015-08-11 ENCOUNTER — Ambulatory Visit (INDEPENDENT_AMBULATORY_CARE_PROVIDER_SITE_OTHER): Payer: Medicare HMO | Admitting: *Deleted

## 2015-08-11 DIAGNOSIS — I82409 Acute embolism and thrombosis of unspecified deep veins of unspecified lower extremity: Secondary | ICD-10-CM

## 2015-08-11 DIAGNOSIS — Z5181 Encounter for therapeutic drug level monitoring: Secondary | ICD-10-CM

## 2015-08-11 DIAGNOSIS — Z7901 Long term (current) use of anticoagulants: Secondary | ICD-10-CM | POA: Diagnosis not present

## 2015-08-11 LAB — POCT INR: INR: 2.8

## 2015-08-11 NOTE — Progress Notes (Signed)
Pre visit review using our clinic review tool, if applicable. No additional management support is needed unless otherwise documented below in the visit note. 

## 2015-08-12 ENCOUNTER — Other Ambulatory Visit: Payer: Self-pay | Admitting: *Deleted

## 2015-08-12 ENCOUNTER — Other Ambulatory Visit: Payer: Self-pay | Admitting: Family Medicine

## 2015-08-12 NOTE — Telephone Encounter (Signed)
Rout to Gina 

## 2015-09-08 ENCOUNTER — Ambulatory Visit (INDEPENDENT_AMBULATORY_CARE_PROVIDER_SITE_OTHER): Payer: Medicare HMO | Admitting: Primary Care

## 2015-09-08 ENCOUNTER — Encounter: Payer: Self-pay | Admitting: Primary Care

## 2015-09-08 ENCOUNTER — Ambulatory Visit (INDEPENDENT_AMBULATORY_CARE_PROVIDER_SITE_OTHER): Payer: Medicare HMO | Admitting: *Deleted

## 2015-09-08 ENCOUNTER — Ambulatory Visit: Payer: Medicare HMO

## 2015-09-08 VITALS — BP 122/66 | HR 78 | Temp 98.5°F | Ht 63.0 in | Wt 136.1 lb

## 2015-09-08 DIAGNOSIS — Z5181 Encounter for therapeutic drug level monitoring: Secondary | ICD-10-CM | POA: Diagnosis not present

## 2015-09-08 DIAGNOSIS — R319 Hematuria, unspecified: Secondary | ICD-10-CM

## 2015-09-08 DIAGNOSIS — N390 Urinary tract infection, site not specified: Secondary | ICD-10-CM

## 2015-09-08 DIAGNOSIS — I82409 Acute embolism and thrombosis of unspecified deep veins of unspecified lower extremity: Secondary | ICD-10-CM

## 2015-09-08 DIAGNOSIS — Z7901 Long term (current) use of anticoagulants: Secondary | ICD-10-CM | POA: Diagnosis not present

## 2015-09-08 LAB — POC URINALSYSI DIPSTICK (AUTOMATED)
BILIRUBIN UA: NEGATIVE
GLUCOSE UA: NEGATIVE
Ketones, UA: NEGATIVE
NITRITE UA: NEGATIVE
Spec Grav, UA: 1.025
UROBILINOGEN UA: NEGATIVE
pH, UA: 5.5

## 2015-09-08 LAB — POCT INR: INR: 2.6

## 2015-09-08 MED ORDER — CEPHALEXIN 500 MG PO CAPS: 500.0000 mg | ORAL_CAPSULE | Freq: Two times a day (BID) | ORAL | Status: DC

## 2015-09-08 NOTE — Progress Notes (Signed)
Pre visit review using our clinic review tool, if applicable. No additional management support is needed unless otherwise documented below in the visit note. 

## 2015-09-08 NOTE — Patient Instructions (Signed)
Start Cephalexin antibiotics. Take 1 capsule by mouth twice daily for 7 days.  Ensure you stay hydrated with water.  Please notify me if no improvement in symptoms in 3-4 days.  It was a pleasure meeting you!  Urinary Tract Infection Urinary tract infections (UTIs) can develop anywhere along your urinary tract. Your urinary tract is your body's drainage system for removing wastes and extra water. Your urinary tract includes two kidneys, two ureters, a bladder, and a urethra. Your kidneys are a pair of bean-shaped organs. Each kidney is about the size of your fist. They are located below your ribs, one on each side of your spine. CAUSES Infections are caused by microbes, which are microscopic organisms, including fungi, viruses, and bacteria. These organisms are so small that they can only be seen through a microscope. Bacteria are the microbes that most commonly cause UTIs. SYMPTOMS  Symptoms of UTIs may vary by age and gender of the patient and by the location of the infection. Symptoms in young women typically include a frequent and intense urge to urinate and a painful, burning feeling in the bladder or urethra during urination. Older women and men are more likely to be tired, shaky, and weak and have muscle aches and abdominal pain. A fever may mean the infection is in your kidneys. Other symptoms of a kidney infection include pain in your back or sides below the ribs, nausea, and vomiting. DIAGNOSIS To diagnose a UTI, your caregiver will ask you about your symptoms. Your caregiver will also ask you to provide a urine sample. The urine sample will be tested for bacteria and white blood cells. White blood cells are made by your body to help fight infection. TREATMENT  Typically, UTIs can be treated with medication. Because most UTIs are caused by a bacterial infection, they usually can be treated with the use of antibiotics. The choice of antibiotic and length of treatment depend on your symptoms  and the type of bacteria causing your infection. HOME CARE INSTRUCTIONS  If you were prescribed antibiotics, take them exactly as your caregiver instructs you. Finish the medication even if you feel better after you have only taken some of the medication.  Drink enough water and fluids to keep your urine clear or pale yellow.  Avoid caffeine, tea, and carbonated beverages. They tend to irritate your bladder.  Empty your bladder often. Avoid holding urine for long periods of time.  Empty your bladder before and after sexual intercourse.  After a bowel movement, women should cleanse from front to back. Use each tissue only once. SEEK MEDICAL CARE IF:   You have back pain.  You develop a fever.  Your symptoms do not begin to resolve within 3 days. SEEK IMMEDIATE MEDICAL CARE IF:   You have severe back pain or lower abdominal pain.  You develop chills.  You have nausea or vomiting.  You have continued burning or discomfort with urination. MAKE SURE YOU:   Understand these instructions.  Will watch your condition.  Will get help right away if you are not doing well or get worse.   This information is not intended to replace advice given to you by your health care provider. Make sure you discuss any questions you have with your health care provider.   Document Released: 02/28/2005 Document Revised: 02/09/2015 Document Reviewed: 06/29/2011 Elsevier Interactive Patient Education [employer]2016 [employer] Inc.

## 2015-09-08 NOTE — Progress Notes (Signed)
Subjective:    Patient ID: Tracy Morales, female    DOB: Oct 12, 1919, 82 y.o.   MRN: [id number]  HPI  Tracy Morales is a 82 year old female who presents today with a chief complaint of left flank pain. She also reports foul smelling urine and fatigue. Her symptoms have been present for about 1 week. She has to self catheterize herself for urine and will cath three times in the morning/afternoon and three times in the evening. Denies hematuria, fevers, chills, vaginal discharge, vaginal itching. She has follow up with her Urologist Monday next week. She's not taken anything OTC for her symptoms.   Review of Systems  Constitutional: Negative for fever.  Gastrointestinal: Negative for nausea and abdominal pain.  Genitourinary: Positive for flank pain. Negative for dysuria, frequency and hematuria.       Foul smelling urine       Past Medical History  Diagnosis Date  . Dyspnea     echo with normal EF. +diastolic dysfunction  . Occlusion and stenosis of carotid artery without mention of cerebral infarction 4/09    u/s L 40-59%, R 0-39%; stable for many years  . Peripheral vascular disease, unspecified (HCC)   . Bradycardia   . HTN (hypertension)   . Hyperlipidemia   . RUQ pain   . Urge incontinence   . Chronic kidney disease, unspecified (HCC)     chronic renal insufficiency  . Mononeuritis of lower limb, unspecified     peripheral neuropathy, bilateral  . Cerebrovascular disease, unspecified   . Osteoarthrosis, unspecified whether generalized or localized, unspecified site   . Dizziness and giddiness   . Allergic rhinitis, cause unspecified   . Polymyalgia rheumatica (HCC)     hx  . Hypertonicity of bladder     hx  . DVT (deep venous thrombosis) (HCC)     R  . 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 Mild depression     Social History   Social History  . Marital Status: Widowed    Spouse Name: N/A  . Number of Children: 2  . Years of Education: N/A    Occupational History  . Retired    Social History Main Topics  . Smoking status: Never Smoker   . Smokeless tobacco: Never Used     Comment: non smoker   . Alcohol Use: No  . Drug Use: No  . Sexual Activity: Not on file   Other Topics Concern  . Not on file   Social History Narrative   Widowed. 2 children. Retired, gets regular exercise.     Past Surgical History  Procedure Laterality Date  . Cataract extraction    . Cholecystectomy  2000  . Carotid doppler  4/02    bilateral plaque   . Echo      mild LVH EF 50% As  . Arterial doppler    . Vert. basilar insufficiency  2002  . Dexa  2/01    osteopenia  . Carotid doppler  8/06    non significant stenosis  . Zoster  5/02  . Colonoscopy  1/04    polyps  . Triger finger contracture  6/04  . 2d echo  8/06    mild aortic stenosis.   . Carotid doppler      R 40%; L 40-60%  . Ct scan  05/25/08    abd and pelvis- inflated urinary bladder and large amt stool throughout w/o blockage  Family History  Problem Relation Age of Onset  . Hypertension Father   . Other Father     Cardiac problems  . Heart disease Father   . Other Brother     blood clot  . Other Sister     benign breast nodule    Allergies  Allergen Reactions  . Ace Inhibitors     REACTION: cough    Current Outpatient Prescriptions on File Prior to Visit  Medication Sig Dispense Refill  . acetaminophen (TYLENOL) 500 MG tablet Take 500 mg by mouth every 6 (six) hours as needed. OTC-UAD     . amLODipine (NORVASC) 5 MG tablet TAKE 1 TABLET (5 MG TOTAL) BY MOUTH DAILY. 30 tablet 11  . aspirin 81 MG EC tablet Take 81 mg by mouth daily.      . ferrous sulfate 325 (65 FE) MG tablet Take 1 tablet (325 mg total) by mouth 2 (two) times daily. 60 tablet 11  . Meclizine HCl (BONINE PO) Take by mouth as needed.    . mirtazapine (REMERON) 30 MG tablet TAKE 1 TABLET (30 MG TOTAL) BY MOUTH AT BEDTIME. 30 tablet 11  . NON FORMULARY Walker with wheels - for use  with ambulation once daily. 429.9, 715.90, 355.9     . psyllium (METAMUCIL) 58.6 % packet Take 1 packet by mouth daily.    . simvastatin (ZOCOR) 20 MG tablet TAKE 1 TABLET (20 MG TOTAL) BY MOUTH DAILY. 30 tablet 11  . warfarin (COUMADIN) 5 MG tablet TAKE AS DIRECTED BY ANTI-COAGULATION CLINIC. 60 tablet 0   No current facility-administered medications on file prior to visit.    BP 122/66 mmHg  Pulse 78  Temp(Src) 98.5 F (36.9 C) (Oral)  Ht 5\' 3"  (1.6 m)  Wt 136 lb 1.9 oz (61.744 kg)  BMI 24.12 kg/m2  SpO2 99%    Objective:   Physical Exam  Constitutional: She appears well-nourished.  Cardiovascular: Normal rate and regular rhythm.   Pulmonary/Chest: Effort normal and breath sounds normal.  Abdominal: Soft. Bowel sounds are normal. There is no tenderness. There is no CVA tenderness.  Skin: Skin is warm and dry.          Assessment & Plan:  Urinary Tract Infection:  Left flank pain with foul smelling urine x 1 week. Self catheterizes and notices that she's not feeling "right" Exam unremarkable, does not appear ill or toxic. UA: 3+ leuks, trace blood, no nitrities. Culture sent and is pending. Given symptoms and large amount of bacteria, will treat. Start Cephalexin BID course. Follow up with urology as scheduled.  Return precautions provided.

## 2015-09-10 LAB — URINE CULTURE: Colony Count: >=100000

## 2015-09-27 ENCOUNTER — Encounter: Payer: Self-pay | Admitting: Podiatry

## 2015-09-27 ENCOUNTER — Ambulatory Visit (INDEPENDENT_AMBULATORY_CARE_PROVIDER_SITE_OTHER): Payer: Medicare HMO | Admitting: Podiatry

## 2015-09-27 DIAGNOSIS — B351 Tinea unguium: Secondary | ICD-10-CM | POA: Diagnosis not present

## 2015-09-27 DIAGNOSIS — Q828 Other specified congenital malformations of skin: Secondary | ICD-10-CM | POA: Diagnosis not present

## 2015-09-27 DIAGNOSIS — E1149 Type 2 diabetes mellitus with other diabetic neurological complication: Secondary | ICD-10-CM

## 2015-09-27 DIAGNOSIS — M79676 Pain in unspecified toe(s): Secondary | ICD-10-CM

## 2015-09-27 NOTE — Progress Notes (Signed)
Patient ID: Tracy Morales, female   DOB: 1919-10-10, 82 y.o.   MRN: [id number]  Subjective: 82 y.o.-year-old female  returns the office today for painful, elongated, thickened toenails which she is unable to trim herself. Denies any redness or drainage around the nails. Denies any acute changes since last appointment and no new complaints today. Denies any systemic complaints such as fevers, chills, nausea, vomiting.   Objective: AAO 3, NAD DP/PT pulses palpable 1/4, CRT less than 3 seconds Decreased protective sensation with SWMF.  Nails hypertrophic, dystrophic, elongated, brittle, discolored 10. There is tenderness overlying the nails 1-5 bilaterally. There is no surrounding erythema or drainage along the nail sites. Right submetatarsal 1 hyperkeratotic lesion. Upon debridement of underlying ulceration, drainage or other signs of infection. No open lesions or pre-ulcerative lesions are identified. No other areas of tenderness bilateral lower extremities. No overlying edema, erythema, increased warmth. Bilateral HAV present. Hammertoes present.  No pain with calf compression, swelling, warmth, erythema.  Assessment: Patient presents with symptomatic onychomycosis; neuropathy; hyperkerotic lesion   Plan: -Treatment options including alternatives, risks, complications were discussed -Nails sharply debrided 10 without complication/bleeding. -Hyperkeratotic lesion sharply debrided without complication/bleeding x 1 -Discussed daily foot inspection. If there are any changes, to call the office immediately.  -Follow-up in 3 months or sooner if any problems are to arise. In the meantime, encouraged to call the office with any questions, concerns, changes symptoms.  Ovid CurdMatthew Wagoner, DPM

## 2015-10-06 ENCOUNTER — Ambulatory Visit (INDEPENDENT_AMBULATORY_CARE_PROVIDER_SITE_OTHER): Payer: Medicare HMO | Admitting: *Deleted

## 2015-10-06 DIAGNOSIS — Z5181 Encounter for therapeutic drug level monitoring: Secondary | ICD-10-CM

## 2015-10-06 DIAGNOSIS — Z7901 Long term (current) use of anticoagulants: Secondary | ICD-10-CM | POA: Diagnosis not present

## 2015-10-06 DIAGNOSIS — I82409 Acute embolism and thrombosis of unspecified deep veins of unspecified lower extremity: Secondary | ICD-10-CM

## 2015-10-06 LAB — POCT INR: INR: 2.7

## 2015-10-06 NOTE — Progress Notes (Signed)
Pre visit review using our clinic review tool, if applicable. No additional management support is needed unless otherwise documented below in the visit note. 

## 2015-11-03 ENCOUNTER — Ambulatory Visit (INDEPENDENT_AMBULATORY_CARE_PROVIDER_SITE_OTHER): Payer: Medicare HMO | Admitting: *Deleted

## 2015-11-03 DIAGNOSIS — Z5181 Encounter for therapeutic drug level monitoring: Secondary | ICD-10-CM

## 2015-11-03 DIAGNOSIS — I82409 Acute embolism and thrombosis of unspecified deep veins of unspecified lower extremity: Secondary | ICD-10-CM

## 2015-11-03 DIAGNOSIS — Z7901 Long term (current) use of anticoagulants: Secondary | ICD-10-CM | POA: Diagnosis not present

## 2015-11-03 LAB — POCT INR: INR: 2.1

## 2015-11-03 NOTE — Progress Notes (Signed)
Pre visit review using our clinic review tool, if applicable. No additional management support is needed unless otherwise documented below in the visit note.  INR is sub therapeutic today.  Patient denies any missed doses, new medications, or diet changes.  She was instructed to take a whole tablet today, then resume dosing as previously scheduled.  She was encouraged to avoid greens for the next 2-3 days.  Will recheck in 2-3 weeks and make additional adjustments at that time if needed.

## 2015-11-05 ENCOUNTER — Other Ambulatory Visit: Payer: Self-pay | Admitting: Family Medicine

## 2015-11-07 NOTE — Telephone Encounter (Signed)
Rout to Gina 

## 2015-11-24 ENCOUNTER — Other Ambulatory Visit: Payer: Self-pay | Admitting: Family Medicine

## 2015-11-24 ENCOUNTER — Other Ambulatory Visit (INDEPENDENT_AMBULATORY_CARE_PROVIDER_SITE_OTHER): Payer: Medicare HMO

## 2015-11-24 DIAGNOSIS — Z7901 Long term (current) use of anticoagulants: Secondary | ICD-10-CM

## 2015-11-24 DIAGNOSIS — I82409 Acute embolism and thrombosis of unspecified deep veins of unspecified lower extremity: Secondary | ICD-10-CM

## 2015-11-24 DIAGNOSIS — Z5181 Encounter for therapeutic drug level monitoring: Secondary | ICD-10-CM

## 2015-11-24 LAB — POCT INR: INR: 3.1

## 2015-12-03 ENCOUNTER — Telehealth: Payer: Self-pay

## 2015-12-03 NOTE — Telephone Encounter (Signed)
Patient is on the list for [employer] 2017 and may be a good candidate for an AWV in 2017. Please let me know if/when appt is scheduled.   

## 2015-12-16 ENCOUNTER — Telehealth: Payer: Self-pay | Admitting: Family Medicine

## 2015-12-16 NOTE — Telephone Encounter (Signed)
Fine with me -but I don't know what staff will be like that day-please let her know and see if it can be changed

## 2015-12-16 NOTE — Telephone Encounter (Signed)
Pt daughter called. Wants to change PT/INR from 7/24 to 7/19. Will this be ok?  Please call back [phone]

## 2015-12-16 NOTE — Telephone Encounter (Signed)
Tracy Morales said that was okay, so appt changed and daughter notified

## 2015-12-21 ENCOUNTER — Other Ambulatory Visit (INDEPENDENT_AMBULATORY_CARE_PROVIDER_SITE_OTHER): Payer: Medicare HMO

## 2015-12-21 DIAGNOSIS — I82409 Acute embolism and thrombosis of unspecified deep veins of unspecified lower extremity: Secondary | ICD-10-CM

## 2015-12-21 DIAGNOSIS — Z5181 Encounter for therapeutic drug level monitoring: Secondary | ICD-10-CM | POA: Diagnosis not present

## 2015-12-21 DIAGNOSIS — Z7901 Long term (current) use of anticoagulants: Secondary | ICD-10-CM

## 2015-12-21 LAB — POCT INR: INR: 3.6

## 2015-12-26 ENCOUNTER — Other Ambulatory Visit: Payer: Medicare HMO

## 2015-12-27 ENCOUNTER — Ambulatory Visit (INDEPENDENT_AMBULATORY_CARE_PROVIDER_SITE_OTHER): Payer: Medicare HMO | Admitting: Podiatry

## 2015-12-27 ENCOUNTER — Encounter: Payer: Self-pay | Admitting: Podiatry

## 2015-12-27 DIAGNOSIS — E1149 Type 2 diabetes mellitus with other diabetic neurological complication: Secondary | ICD-10-CM | POA: Diagnosis not present

## 2015-12-27 DIAGNOSIS — B351 Tinea unguium: Secondary | ICD-10-CM

## 2015-12-27 DIAGNOSIS — M79676 Pain in unspecified toe(s): Secondary | ICD-10-CM | POA: Diagnosis not present

## 2015-12-27 DIAGNOSIS — Q828 Other specified congenital malformations of skin: Secondary | ICD-10-CM | POA: Diagnosis not present

## 2015-12-27 NOTE — Progress Notes (Signed)
Patient ID: JAELENE DECH, female   DOB: 06-14-1919, 82 y.o.   MRN: [id number]  Subjective: 82 y.o.-year-old female  returns the office today for painful, elongated, thickened toenails which she is unable to trim herself. Denies any redness or drainage around the nails. Denies any acute changes since last appointment and no new complaints today. Denies any systemic complaints such as fevers, chills, nausea, vomiting.   Objective: AAO 3, NAD DP/PT pulses palpable 1/4, CRT less than 3 seconds Decreased protective sensation with SWMF.  Nails hypertrophic, dystrophic, elongated, brittle, discolored 10. There is tenderness overlying the nails 1-5 bilaterally. There is no surrounding erythema or drainage along the nail sites. Right submetatarsal 1 hyperkeratotic lesion. Upon debridement of underlying ulceration, drainage or other signs of infection. No open lesions or pre-ulcerative lesions are identified. HAV and hammertoes present No other areas of tenderness bilateral lower extremities. No overlying edema, erythema, increased warmth. Bilateral HAV present. Hammertoes present.  No pain with calf compression, swelling, warmth, erythema. Overall, exam is unchanged.   Assessment: Patient presents with symptomatic onychomycosis; neuropathy; hyperkerotic lesion   Plan: -Treatment options including alternatives, risks, complications were discussed -Nails sharply debrided 10 without complication/bleeding. -Hyperkeratotic lesion sharply debrided without complication/bleeding x 1 -Discussed daily foot inspection. If there are any changes, to call the office immediately.  -Follow-up in 3 months or sooner if any problems are to arise. In the meantime, encouraged to call the office with any questions, concerns, changes symptoms.  Ovid Curd, DPM

## 2016-01-05 ENCOUNTER — Telehealth: Payer: Self-pay | Admitting: Family Medicine

## 2016-01-05 ENCOUNTER — Other Ambulatory Visit (INDEPENDENT_AMBULATORY_CARE_PROVIDER_SITE_OTHER): Payer: Medicare HMO

## 2016-01-05 DIAGNOSIS — Z7901 Long term (current) use of anticoagulants: Secondary | ICD-10-CM

## 2016-01-05 DIAGNOSIS — Z5181 Encounter for therapeutic drug level monitoring: Secondary | ICD-10-CM | POA: Diagnosis not present

## 2016-01-05 DIAGNOSIS — I82409 Acute embolism and thrombosis of unspecified deep veins of unspecified lower extremity: Secondary | ICD-10-CM | POA: Diagnosis not present

## 2016-01-05 LAB — POCT INR: INR: 2.4

## 2016-01-05 NOTE — Telephone Encounter (Signed)
Thanks Carrie! 

## 2016-01-05 NOTE — Telephone Encounter (Signed)
Patient's daughter returned Kim's call about lab work.  I let her know to continue current dose and she scheduled appointment for patient on 01/19/16 at 9:45.

## 2016-01-19 ENCOUNTER — Other Ambulatory Visit (INDEPENDENT_AMBULATORY_CARE_PROVIDER_SITE_OTHER): Payer: Medicare HMO

## 2016-01-19 DIAGNOSIS — Z7901 Long term (current) use of anticoagulants: Secondary | ICD-10-CM

## 2016-01-19 DIAGNOSIS — I82409 Acute embolism and thrombosis of unspecified deep veins of unspecified lower extremity: Secondary | ICD-10-CM | POA: Diagnosis not present

## 2016-01-19 DIAGNOSIS — Z5181 Encounter for therapeutic drug level monitoring: Secondary | ICD-10-CM | POA: Diagnosis not present

## 2016-01-19 LAB — POCT INR: INR: 2.6

## 2016-01-23 ENCOUNTER — Telehealth: Payer: Self-pay | Admitting: Family Medicine

## 2016-01-23 NOTE — Telephone Encounter (Signed)
Addressed through result notes  

## 2016-01-23 NOTE — Telephone Encounter (Signed)
Patient's daughter,[name],returned Shapale's call.

## 2016-02-26 ENCOUNTER — Other Ambulatory Visit: Payer: Self-pay | Admitting: Family Medicine

## 2016-02-27 NOTE — Telephone Encounter (Signed)
Please refill times 3 

## 2016-02-27 NOTE — Telephone Encounter (Signed)
done

## 2016-02-27 NOTE — Telephone Encounter (Signed)
Last filled on 11/07/15 #60 with 0 refills, please advise

## 2016-03-05 ENCOUNTER — Other Ambulatory Visit (INDEPENDENT_AMBULATORY_CARE_PROVIDER_SITE_OTHER): Payer: Medicare HMO

## 2016-03-05 ENCOUNTER — Ambulatory Visit: Payer: Medicare HMO

## 2016-03-05 ENCOUNTER — Telehealth: Payer: Self-pay | Admitting: Family Medicine

## 2016-03-05 DIAGNOSIS — Z23 Encounter for immunization: Secondary | ICD-10-CM | POA: Diagnosis not present

## 2016-03-05 DIAGNOSIS — Z5181 Encounter for therapeutic drug level monitoring: Secondary | ICD-10-CM

## 2016-03-05 DIAGNOSIS — I82491 Acute embolism and thrombosis of other specified deep vein of right lower extremity: Secondary | ICD-10-CM | POA: Diagnosis not present

## 2016-03-05 DIAGNOSIS — Z7901 Long term (current) use of anticoagulants: Secondary | ICD-10-CM

## 2016-03-05 LAB — POCT INR: INR: 2.6

## 2016-03-05 NOTE — Addendum Note (Signed)
Addended by: Liane ComberHAVERS, NATASHA C on: 03/05/2016 11:55 AM   Modules accepted: Orders

## 2016-03-05 NOTE — Telephone Encounter (Signed)
Pt called - she would like to have the pt and the cpe labs both done on 11/07.  Is this ok for the pt?  Thanks  [phone]

## 2016-03-05 NOTE — Telephone Encounter (Signed)
Daughter notified and INR appt cancelled on 04/16/16, and advise we will do everything on 04/10/16

## 2016-03-05 NOTE — Telephone Encounter (Signed)
I don't mind it if she has labs the same day as she is seen-no problem

## 2016-03-29 ENCOUNTER — Ambulatory Visit (INDEPENDENT_AMBULATORY_CARE_PROVIDER_SITE_OTHER): Payer: Medicare HMO | Admitting: Podiatry

## 2016-03-29 ENCOUNTER — Encounter: Payer: Self-pay | Admitting: Podiatry

## 2016-03-29 DIAGNOSIS — E1149 Type 2 diabetes mellitus with other diabetic neurological complication: Secondary | ICD-10-CM | POA: Diagnosis not present

## 2016-03-29 DIAGNOSIS — B351 Tinea unguium: Secondary | ICD-10-CM

## 2016-03-29 DIAGNOSIS — Q828 Other specified congenital malformations of skin: Secondary | ICD-10-CM

## 2016-03-29 DIAGNOSIS — M79676 Pain in unspecified toe(s): Secondary | ICD-10-CM | POA: Diagnosis not present

## 2016-03-29 NOTE — Progress Notes (Signed)
Patient ID: Tracy Morales, female   DOB: Feb 05, 1920, 82 y.o.   MRN: [id number]  Subjective: 82 y.o.-year-old female  returns the office today for painful, elongated, thickened toenails which she is unable to trim herself. Denies any redness or drainage around the nails. Denies any acute changes since last appointment and no new complaints today. Denies any systemic complaints such as fevers, chills, nausea, vomiting.   Objective: AAO 3, NAD DP/PT pulses palpable 1/4, CRT less than 3 seconds Decreased protective sensation with SWMF.  Nails hypertrophic, dystrophic, elongated, brittle, discolored 10. There is tenderness overlying the nails 1-5 bilaterally. There is no surrounding erythema or drainage along the nail sites. Right submetatarsal 1 hyperkeratotic lesion. Upon debridement of underlying ulceration, drainage or other signs of infection. No open lesions or pre-ulcerative lesions are identified. HAV and hammertoes present. Atrophy of the fat pad and plantarflexion of the metatarsal heads.  No other areas of tenderness bilateral lower extremities. No overlying edema, erythema, increased warmth. Bilateral HAV present. Hammertoes present.  No pain with calf compression, swelling, warmth, erythema. Overall, exam is unchanged.   Assessment: Patient presents with symptomatic onychomycosis; neuropathy; hyperkerotic lesion   Plan: -Treatment options including alternatives, risks, complications were discussed -Nails sharply debrided 10 without complication/bleeding. -Hyperkeratotic lesion sharply debrided without complication/bleeding x 1 -Discussed daily foot inspection. If there are any changes, to call the office immediately.  -Follow-up in 3 months or sooner if any problems are to arise. In the meantime, encouraged to call the office with any questions, concerns, changes symptoms.  Ovid CurdMatthew Wagoner, DPM

## 2016-04-08 ENCOUNTER — Telehealth: Payer: Self-pay | Admitting: Family Medicine

## 2016-04-08 DIAGNOSIS — Z0000 Encounter for general adult medical examination without abnormal findings: Secondary | ICD-10-CM

## 2016-04-08 NOTE — Telephone Encounter (Signed)
-----   Message from Alvina Chouerri J Walsh sent at 04/02/2016 11:01 AM EDT ----- Regarding: Lab orders for Tuesday, 11.7.17 Patient is scheduled for CPX labs, please order future labs, Thanks , Camelia Engerri

## 2016-04-10 ENCOUNTER — Other Ambulatory Visit (INDEPENDENT_AMBULATORY_CARE_PROVIDER_SITE_OTHER): Payer: Medicare HMO

## 2016-04-10 DIAGNOSIS — Z0000 Encounter for general adult medical examination without abnormal findings: Secondary | ICD-10-CM

## 2016-04-10 DIAGNOSIS — I82491 Acute embolism and thrombosis of other specified deep vein of right lower extremity: Secondary | ICD-10-CM | POA: Diagnosis not present

## 2016-04-10 DIAGNOSIS — Z5181 Encounter for therapeutic drug level monitoring: Secondary | ICD-10-CM | POA: Diagnosis not present

## 2016-04-10 DIAGNOSIS — Z7901 Long term (current) use of anticoagulants: Secondary | ICD-10-CM

## 2016-04-10 LAB — COMPREHENSIVE METABOLIC PANEL
ALBUMIN: 3.9 g/dL (ref 3.5–5.2)
ALT: 13 U/L (ref 0–35)
AST: 27 U/L (ref 0–37)
Alkaline Phosphatase: 100 U/L (ref 39–117)
BUN: 31 mg/dL — AB (ref 6–23)
CHLORIDE: 104 meq/L (ref 96–112)
CO2: 27 meq/L (ref 19–32)
CREATININE: 1.66 mg/dL — AB (ref 0.40–1.20)
Calcium: 9.3 mg/dL (ref 8.4–10.5)
GFR: 36.95 mL/min — ABNORMAL LOW (ref >60.00)
Glucose, Bld: 99 mg/dL (ref 70–99)
Potassium: 4.3 mEq/L (ref 3.5–5.1)
SODIUM: 139 meq/L (ref 135–145)
Total Bilirubin: 0.5 mg/dL (ref 0.2–1.2)
Total Protein: 7.3 g/dL (ref 6.0–8.3)

## 2016-04-10 LAB — CBC WITH DIFFERENTIAL/PLATELET
BASOS PCT: 0.5 % (ref 0.0–3.0)
Basophils Absolute: 0 10*3/uL (ref 0.0–0.1)
EOS ABS: 0.1 10*3/uL (ref 0.0–0.7)
Eosinophils Relative: 3.7 % (ref 0.0–5.0)
HCT: 38.9 % (ref 36.0–46.0)
HEMOGLOBIN: 12.7 g/dL (ref 12.0–15.0)
Lymphocytes Relative: 41.6 % (ref 12.0–46.0)
Lymphs Abs: 1.7 10*3/uL (ref 0.7–4.0)
MCHC: 32.8 g/dL (ref 30.0–36.0)
MCV: 88.1 fl (ref 78.0–100.0)
MONO ABS: 0.6 10*3/uL (ref 0.1–1.0)
Monocytes Relative: 14.3 % — ABNORMAL HIGH (ref 3.0–12.0)
Neutro Abs: 1.6 10*3/uL (ref 1.4–7.7)
Neutrophils Relative %: 39.9 % — ABNORMAL LOW (ref 43.0–77.0)
Platelets: 229 10*3/uL (ref 150.0–400.0)
RBC: 4.42 Mil/uL (ref 3.87–5.11)
RDW: 15 % (ref 11.5–15.5)
WBC: 4 10*3/uL (ref 4.0–10.5)

## 2016-04-10 LAB — LIPID PANEL
CHOL/HDL RATIO: 5
CHOLESTEROL: 159 mg/dL (ref 0–200)
HDL: 34.7 mg/dL — ABNORMAL LOW (ref >39.00)
LDL CALC: 105 mg/dL — AB (ref 0–99)
NONHDL: 124.12
Triglycerides: 95 mg/dL (ref 0.0–149.0)
VLDL: 19 mg/dL (ref 0.0–40.0)

## 2016-04-10 LAB — POCT INR: INR: 2.2

## 2016-04-10 LAB — TSH: TSH: 3.16 u[IU]/mL (ref 0.35–4.50)

## 2016-04-16 ENCOUNTER — Other Ambulatory Visit: Payer: Medicare HMO

## 2016-04-17 ENCOUNTER — Encounter: Payer: Self-pay | Admitting: Family Medicine

## 2016-04-17 ENCOUNTER — Ambulatory Visit (INDEPENDENT_AMBULATORY_CARE_PROVIDER_SITE_OTHER): Payer: Medicare HMO | Admitting: Family Medicine

## 2016-04-17 VITALS — BP 136/70 | HR 75 | Temp 97.7°F | Ht 62.5 in | Wt 135.2 lb

## 2016-04-17 DIAGNOSIS — Z9181 History of falling: Secondary | ICD-10-CM

## 2016-04-17 DIAGNOSIS — I10 Essential (primary) hypertension: Secondary | ICD-10-CM | POA: Diagnosis not present

## 2016-04-17 DIAGNOSIS — E7800 Pure hypercholesterolemia, unspecified: Secondary | ICD-10-CM | POA: Diagnosis not present

## 2016-04-17 DIAGNOSIS — Z0000 Encounter for general adult medical examination without abnormal findings: Secondary | ICD-10-CM

## 2016-04-17 DIAGNOSIS — M79604 Pain in right leg: Secondary | ICD-10-CM

## 2016-04-17 DIAGNOSIS — N189 Chronic kidney disease, unspecified: Secondary | ICD-10-CM | POA: Diagnosis not present

## 2016-04-17 MED ORDER — AMLODIPINE BESYLATE 5 MG PO TABS: 5.0000 mg | ORAL_TABLET | Freq: Every day | ORAL | 11 refills | Status: DC

## 2016-04-17 MED ORDER — SIMVASTATIN 20 MG PO TABS: ORAL_TABLET | ORAL | 11 refills | Status: DC

## 2016-04-17 MED ORDER — MIRTAZAPINE 30 MG PO TABS: 30.0000 mg | ORAL_TABLET | Freq: Every day | ORAL | 11 refills | Status: DC

## 2016-04-17 NOTE — Patient Instructions (Addendum)
Try to get 1200-1500 mg of calcium per day with at least 1000 iu of vitamin D - for bone health  Use your walker  You cannot take coumadin if you fall  Start metamucil again daily to see if it helps with loose stool  INR is stable - next one is due in 6 weeks  Stop at check out to schedule your medicare interview with the nurse

## 2016-04-17 NOTE — Progress Notes (Signed)
Subjective:    Patient ID: Tracy Morales, female    DOB: [birth year], 82 y.o.   MRN: [id number]  HPI Here for health maintenance exam and to review chronic medical problems    "if I can still stand up I'm all right"  Is walking with a walker  She tries to keep busy   Has not had AMW visit   Wt Readings from Last 3 Encounters:  04/19/16 134 lb 8 oz (61 kg)  04/17/16 135 lb 4 oz (61.3 kg)  09/08/15 136 lb 1.9 oz (61.7 kg)     Eye exam - had it within the past year   Tetanus shot 5/13  dexa 2/01-osteopenia  Does not want to do another one  She takes a vit with ca and D Last month she had a fall - she hit her head and was fine - (when not using her walker) - turning to go into another room  Has a knot on her head  No headaches or dizziness    utd on vaccines  Cannot afford zoster vaccine (has had shingles)   Mammogram 7/15- followed by [place] -had a calcified adenoma  Does not want to keep getting mammograms  Self exam - no changes or lumps   Colonoscopy 1/04 found polyps  Has had loose stool off an on - more than a year , tried carrot soup/helped a bit  Has metamucil - does not use daily    bp is stable today  No cp or palpitations or headaches or edema  No side effects to medicines  BP Readings from Last 3 Encounters:  04/17/16 136/70  09/08/15 122/66  05/05/15 128/68       Chemistry      Component Value Date/Time   NA 139 04/10/2016 0927   NA 137 06/06/2014 1507   K 4.3 04/10/2016 0927   K 4.2 06/06/2014 1507   CL 104 04/10/2016 0927   CL 105 06/06/2014 1507   CO2 27 04/10/2016 0927   CO2 22 06/06/2014 1507   BUN 31 (H) 04/10/2016 0927   BUN 24 (H) 06/06/2014 1507   CREATININE 1.66 (H) 04/10/2016 0927   CREATININE 1.46 (H) 06/06/2014 1507      Component Value Date/Time   CALCIUM 9.3 04/10/2016 0927   CALCIUM 9.0 06/06/2014 1507   ALKPHOS 100 04/10/2016 0927   ALKPHOS 120 (H) 06/06/2014 1507   AST 27 04/10/2016 0927   AST 38 (H) 06/06/2014 1507     ALT 13 04/10/2016 0927   ALT 20 06/06/2014 1507   BILITOT 0.5 04/10/2016 0927   BILITOT 0.4 06/06/2014 1507     hx of CKD-sees renal for this  Cr is up to 1.6  She tries to drink enough fluids (family has to remind)    Hx of hyperlipidemia Lab Results  Component Value Date   CHOL 159 04/10/2016   CHOL 172 04/04/2015   CHOL 161 11/19/2014   Lab Results  Component Value Date   HDL 34.70 (L) 04/10/2016   HDL 39.30 04/04/2015   HDL 31.40 (L) 11/19/2014   Lab Results  Component Value Date   LDLCALC 105 (H) 04/10/2016   LDLCALC 120 (H) 04/04/2015   LDLCALC 110 (H) 11/19/2014   Lab Results  Component Value Date   TRIG 95.0 04/10/2016   TRIG 62.0 04/04/2015   TRIG 97.0 11/19/2014   Lab Results  Component Value Date   CHOLHDL 5 04/10/2016   CHOLHDL 4 04/04/2015   CHOLHDL 5  11/19/2014   No results found for: LDLDIRECT   LDL is down from 120 to 105 HDL is down- less active with age   On simvastatin and diet   On warfarin for hx of DVT Lab Results  Component Value Date   INR 2.2 04/10/2016   INR 2.6 03/05/2016   INR 2.6 01/19/2016   very stable   Lab Results  Component Value Date   TSH 3.16 04/10/2016    Lab Results  Component Value Date   WBC 4.0 04/10/2016   HGB 12.7 04/10/2016   HCT 38.9 04/10/2016   MCV 88.1 04/10/2016   PLT 229.0 04/10/2016    Patient Active Problem List   Diagnosis Date Noted  . Right leg pain 04/17/2016  . Osteoarthritis of both hips 04/15/2015  . Hip pain 04/14/2015  . Routine general medical examination at a health care facility 04/12/2015  . Hair loss 11/19/2014  . Loss of weight 06/09/2014  . Arthritis of right shoulder region 06/09/2014  . Nodule of left lung 06/09/2014  . Encounter for Medicare annual wellness exam 10/30/2013  . H/O fall 04/27/2013  . Risk for falls 04/11/2012  . Leg pain, bilateral 01/29/2012  . MICROSCOPIC HEMATURIA 08/29/2009  . CONDUCTIVE HEARING LOSS BILATERAL 08/08/2009  . DVT 04/12/2009   . EDEMA 04/07/2009  . HYPERPOTASSEMIA 01/25/2009  . BRADYCARDIA 10/26/2008  . DYSPNEA 10/26/2008  . INCONTINENCE, URGE 02/27/2008  . Hyperlipidemia 10/11/2006  . PERIPHERAL NEUROPATHY, LOWER EXTREMITIES, BILATERAL 10/11/2006  . Essential hypertension 10/11/2006  . DIASTOLIC DYSFUNCTION 10/11/2006  . CAROTID STENOSIS 10/11/2006  . CEREBROVASCULAR DISEASE 10/11/2006  . PERIPHERAL VASCULAR DISEASE 10/11/2006  . ALLERGIC RHINITIS 10/11/2006  . Chronic kidney disease 10/11/2006  . OVERACTIVE BLADDER 10/11/2006  . OSTEOARTHRITIS 10/11/2006  . POLYMYALGIA RHEUMATICA, HX OF 10/11/2006  . CARDIAC MURMUR, HX OF 10/11/2006   Past Medical History:  Diagnosis Date  . Allergic rhinitis, cause unspecified   . Bradycardia   . Cerebrovascular disease, unspecified   . Chronic kidney disease, unspecified    chronic renal insufficiency  . Dizziness and giddiness   . DVT (deep venous thrombosis) (HCC)    R  . Dyspnea    echo with normal EF. +diastolic dysfunction  . 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 HTN (hypertension)   . Hyperlipidemia   . Hypertonicity of bladder    hx  . Mild depression (HCC)   . Mononeuritis of lower limb, unspecified    peripheral neuropathy, bilateral  . Occlusion and stenosis of carotid artery without mention of cerebral infarction 4/09   u/s L 40-59%, R 0-39%; stable for many years  . Osteoarthrosis, unspecified whether generalized or localized, unspecified site   . Peripheral vascular disease, unspecified   . Polymyalgia rheumatica (HCC)    hx  . RUQ pain   . Urge incontinence    Past Surgical History:  Procedure Laterality Date  . 2D echo  8/06   mild aortic stenosis.   Marland Kitchen. arterial doppler    . carotid doppler  4/02   bilateral plaque   . carotid doppler  8/06   non significant stenosis  . carotid doppler     R 40%; L 40-60%  . CATARACT EXTRACTION    . CHOLECYSTECTOMY  2000  . COLONOSCOPY  1/04   polyps  . CT SCAN  05/25/08   abd  and pelvis- inflated urinary bladder and large amt stool throughout w/o blockage   . dexa  2/01  osteopenia  . echo     mild LVH EF 50% As  . triger finger contracture  6/04  . vert. basilar insufficiency  2002  . zoster  5/02   Social History  Substance Use Topics  . Smoking status: Never Smoker  . Smokeless tobacco: Never Used     Comment: non smoker   . Alcohol use No   Family History  Problem Relation Age of Onset  . Hypertension Father   . Other Father     Cardiac problems  . Heart disease Father   . Other Brother     blood clot  . Other Sister     benign breast nodule   Allergies  Allergen Reactions  . Ace Inhibitors     REACTION: cough   Current Outpatient Prescriptions on File Prior to Visit  Medication Sig Dispense Refill  . acetaminophen (TYLENOL) 500 MG tablet Take 500 mg by mouth every 6 (six) hours as needed. OTC-UAD     . aspirin 81 MG EC tablet Take 81 mg by mouth daily.      . ferrous sulfate 325 (65 FE) MG tablet Take 1 tablet (325 mg total) by mouth 2 (two) times daily. 60 tablet 11  . Meclizine HCl (BONINE PO) Take by mouth as needed.    . NON FORMULARY Walker with wheels - for use with ambulation once daily. 429.9, 715.90, 355.9     . psyllium (METAMUCIL) 58.6 % packet Take 1 packet by mouth daily.    Marland Kitchen warfarin (COUMADIN) 5 MG tablet TAKE AS DIRECTED BY CLINIC 60 tablet 3   No current facility-administered medications on file prior to visit.     Review of Systems    Review of Systems  Constitutional: Negative for fever, appetite change, fatigue and unexpected weight change.  Eyes: Negative for pain and visual disturbance.  Respiratory: Negative for cough and shortness of breath.   Cardiovascular: Negative for cp or palpitations    Gastrointestinal: Negative for nausea, diarrhea and constipation.  Genitourinary: Negative for urgency and frequency.  Skin: Negative for pallor or rash   Neurological: Negative for , light-headedness, numbness and  headaches. pos for weakness in R leg (used to have more pain)  Hematological: Negative for adenopathy. Does not bruise/bleed easily.  Psychiatric/Behavioral: Negative for dysphoric mood. The patient is not nervous/anxious.      Objective:   Physical Exam  Constitutional: She appears well-developed and well-nourished. No distress.  Frail appearing elderly female -walks with assistance   HENT:  Head: Normocephalic and atraumatic.  Right Ear: External ear normal.  Left Ear: External ear normal.  Nose: Nose normal.  Mouth/Throat: Oropharynx is clear and moist.  Eyes: Conjunctivae and EOM are normal. Pupils are equal, round, and reactive to light. Right eye exhibits no discharge. Left eye exhibits no discharge. No scleral icterus.  Neck: Normal range of motion. Neck supple. No JVD present. Carotid bruit is present. Erythema present. No thyromegaly present.  Cardiovascular: Normal rate, regular rhythm, normal heart sounds and intact distal pulses.  Exam reveals no gallop.   Pulmonary/Chest: Effort normal and breath sounds normal. No respiratory distress. She has no wheezes. She has no rales.  Abdominal: Soft. Bowel sounds are normal. She exhibits no distension and no mass. There is no tenderness.  Musculoskeletal: She exhibits no edema or tenderness.  Mild lymphedema in ankles-non pitting   Lymphadenopathy:    She has no cervical adenopathy.  Neurological: She is alert. She has normal reflexes. No cranial nerve  deficit. She exhibits normal muscle tone. Coordination normal.  Skin: Skin is warm and dry. No rash noted. No erythema. No pallor.  sks diffusely  Psychiatric: She has a normal mood and affect.  Mentally sharp          Assessment & Plan:   Problem List Items Addressed This Visit      Cardiovascular and Mediastinum   Essential hypertension - Primary    bp in fair control at this time  BP Readings from Last 1 Encounters:  04/19/16 120/60   No changes needed Disc lifstyle  change with low sodium diet and exercise   Will continue amlodipine       Relevant Medications   simvastatin (ZOCOR) 20 MG tablet   amLODipine (NORVASC) 5 MG tablet     Genitourinary   Chronic kidney disease    Lab Results  Component Value Date   CREATININE 1.66 (H) 04/10/2016    Pt continues to see renal  Will work towards well controlled bp / good hydration /good cholesterol control Continue renal f/u        Other   Hyperlipidemia    Disc goals for lipids and reasons to control them Rev labs with pt Rev low sat fat diet in detail  Fairly controlled with simvastatin and diet       Relevant Medications   simvastatin (ZOCOR) 20 MG tablet   amLODipine (NORVASC) 5 MG tablet   Right leg pain    More weak now  ? If possibly radicular  Will ref to her ortho for re check/eval  This could inc risk for falls      Relevant Orders   Ambulatory referral to Orthopedic Surgery   Risk for falls    Disc falls and fall risk in detail PT recommended if she is interested Disc placement and use of walker in house  Cannot continue anticoag if she continues to fall Handout on fall prev given      Routine general medical examination at a health care facility    Reviewed health habits including diet and exercise and skin cancer prevention Reviewed appropriate screening tests for age  Also reviewed health mt list, fam hx and immunization status , as well as social and family history   See HPI Labs rev AMW scheduled Try to get 1200-1500 mg of calcium per day with at least 1000 iu of vitamin D - for bone health  Use your walker  You cannot take coumadin if you fall  Start metamucil again daily to see if it helps with loose stool  INR is stable - next one is due in 6 weeks  Stop at check out to schedule your medicare interview with the nurse

## 2016-04-17 NOTE — Progress Notes (Signed)
Pre visit review using our clinic review tool, if applicable. No additional management support is needed unless otherwise documented below in the visit note. 

## 2016-04-19 ENCOUNTER — Ambulatory Visit (INDEPENDENT_AMBULATORY_CARE_PROVIDER_SITE_OTHER): Payer: Medicare HMO

## 2016-04-19 VITALS — BP 120/60 | HR 69 | Temp 97.9°F | Ht 62.5 in | Wt 134.5 lb

## 2016-04-19 DIAGNOSIS — Z0000 Encounter for general adult medical examination without abnormal findings: Secondary | ICD-10-CM | POA: Diagnosis not present

## 2016-04-19 NOTE — Assessment & Plan Note (Signed)
More weak now  ? If possibly radicular  Will ref to her ortho for re check/eval  This could inc risk for falls

## 2016-04-19 NOTE — Assessment & Plan Note (Signed)
Lab Results  Component Value Date   CREATININE 1.66 (H) 04/10/2016    Pt continues to see renal  Will work towards well controlled bp / good hydration /good cholesterol control Continue renal f/u

## 2016-04-19 NOTE — Progress Notes (Signed)
Pre visit review using our clinic review tool, if applicable. No additional management support is needed unless otherwise documented below in the visit note. 

## 2016-04-19 NOTE — Assessment & Plan Note (Signed)
Disc falls and fall risk in detail PT recommended if she is interested Disc placement and use of walker in house  Cannot continue anticoag if she continues to fall Handout on fall prev given

## 2016-04-19 NOTE — Assessment & Plan Note (Signed)
Disc goals for lipids and reasons to control them Rev labs with pt Rev low sat fat diet in detail  Fairly controlled with simvastatin and diet

## 2016-04-19 NOTE — Assessment & Plan Note (Signed)
Reviewed health habits including diet and exercise and skin cancer prevention Reviewed appropriate screening tests for age  Also reviewed health mt list, fam hx and immunization status , as well as social and family history   See HPI Labs rev AMW scheduled Try to get 1200-1500 mg of calcium per day with at least 1000 iu of vitamin D - for bone health  Use your walker  You cannot take coumadin if you fall  Start metamucil again daily to see if it helps with loose stool  INR is stable - next one is due in 6 weeks  Stop at check out to schedule your medicare interview with the nurse

## 2016-04-19 NOTE — Progress Notes (Signed)
Medical screening examination/treatment/procedure(s) were performed by registered nurse and as supervising non-physician practitioner I was immediately available for consultation/collaboration.  BAITY, REGINA, NP  

## 2016-04-19 NOTE — Assessment & Plan Note (Signed)
bp in fair control at this time  BP Readings from Last 1 Encounters:  04/19/16 120/60   No changes needed Disc lifstyle change with low sodium diet and exercise   Will continue amlodipine

## 2016-04-19 NOTE — Progress Notes (Signed)
PCP notes:   Health maintenance:  Eye exam - per pt report, exam dated 10/25/2015  Abnormal screenings:   Fall risk - hx of fall with injury  Patient concerns:   None  Nurse concerns:  None  Next PCP appt:   N/A: pt recently had annual exam with PCP

## 2016-04-19 NOTE — Patient Instructions (Signed)
[name] , Thank you for taking time to come for your Medicare Wellness Visit. I appreciate your ongoing commitment to your health goals. Please review the following plan we discussed and let me know if I can assist you in the future.   These are the goals we discussed: Goals    . Increase water intake          Starting 04/19/2016, I will attempt to drink at least 8 oz water with each meal.        This is a list of the screening recommended for you and due dates:  Health Maintenance  Topic Date Due  . Complete foot exam   07/06/2020*  . Hemoglobin A1C  07/06/2020*  . Urine Protein Check  07/06/2020*  . Shingles Vaccine  07/06/2020*  . Eye exam for diabetics  10/24/2016  . Tetanus Vaccine  10/10/2021  . Flu Shot  Completed  . DEXA scan (bone density measurement)  Completed  . Pneumonia vaccines  Completed  *Topic was postponed. The date shown is not the original due date.   Preventive Care for Adults  A healthy lifestyle and preventive care can promote health and wellness. Preventive health guidelines for adults include the following key practices.  . A routine yearly physical is a good way to check with your health care provider about your health and preventive screening. It is a chance to share any concerns and updates on your health and to receive a thorough exam.  . Visit your dentist for a routine exam and preventive care every 6 months. Brush your teeth twice a day and floss once a day. Good oral hygiene prevents tooth decay and gum disease.  . The frequency of eye exams is based on your age, health, family medical history, use  of contact lenses, and other factors. Follow your health care provider's ecommendations for frequency of eye exams.  . Eat a healthy diet. Foods like vegetables, fruits, whole grains, low-fat dairy products, and lean protein foods contain the nutrients you need without too many calories. Decrease your intake of foods high in solid fats, added  sugars, and salt. Eat the right amount of calories for you. Get information about a proper diet from your health care provider, if necessary.  . Regular physical exercise is one of the most important things you can do for your health. Most adults should get at least 150 minutes of moderate-intensity exercise (any activity that increases your heart rate and causes you to sweat) each week. In addition, most adults need muscle-strengthening exercises on 2 or more days a week.  Silver Sneakers may be a benefit available to you. To determine eligibility, you may visit the website: www.silversneakers.com or contact program at (808)[phone] Mon-Fri between 8AM-8PM.   . Maintain a healthy weight. The body mass index (BMI) is a screening tool to identify possible weight problems. It provides an estimate of body fat based on height and weight. Your health care provider can find your BMI and can help you achieve or maintain a healthy weight.   For adults 20 years and older: ? A BMI below 18.5 is considered underweight. ? A BMI of 18.5 to 24.9 is normal. ? A BMI of 25 to 29.9 is considered overweight. ? A BMI of 30 and above is considered obese.   . Maintain normal blood lipids and cholesterol levels by exercising and minimizing your intake of saturated fat. Eat a balanced diet with plenty of fruit and vegetables. Blood tests for  lipids and cholesterol should begin at age 62 and be repeated every 5 years. If your lipid or cholesterol levels are high, you are over 50, or you are at high risk for heart disease, you may need your cholesterol levels checked more frequently. Ongoing high lipid and cholesterol levels should be treated with medicines if diet and exercise are not working.  . If you smoke, find out from your health care provider how to quit. If you do not use tobacco, please do not start.  . If you choose to drink alcohol, please do not consume more than 2 drinks per day. One drink is considered to  be 12 ounces (355 mL) of beer, 5 ounces (148 mL) of wine, or 1.5 ounces (44 mL) of liquor.  . If you are 14-80 years old, ask your health care provider if you should take aspirin to prevent strokes.  . Use sunscreen. Apply sunscreen liberally and repeatedly throughout the day. You should seek shade when your shadow is shorter than you. Protect yourself by wearing long sleeves, pants, a wide-brimmed hat, and sunglasses year round, whenever you are outdoors.  . Once a month, do a whole body skin exam, using a mirror to look at the skin on your back. Tell your health care provider of new moles, moles that have irregular borders, moles that are larger than a pencil eraser, or moles that have changed in shape or color.

## 2016-04-19 NOTE — Progress Notes (Signed)
Subjective:   Tracy Morales is a 82 y.o. female who presents for Medicare Annual (Subsequent) preventive examination.  Review of Systems:  N/A Cardiac Risk Factors include: advanced age (>13men, >24 women);dyslipidemia;hypertension     Objective:     Vitals: BP 120/60 (BP Location: Left Arm, Patient Position: Sitting, Cuff Size: Normal)   Pulse 69   Temp 97.9 F (36.6 C) (Oral)   Ht 5' 2.5" (1.588 m) Comment: no shoes  Wt 134 lb 8 oz (61 kg)   SpO2 98%   BMI 24.21 kg/m   Body mass index is 24.21 kg/m.   Tobacco History  Smoking Status  . Never Smoker  Smokeless Tobacco  . Never Used    Comment: non smoker      Counseling given: No   Past Medical History:  Diagnosis Date  . Allergic rhinitis, cause unspecified   . Bradycardia   . Cerebrovascular disease, unspecified   . Chronic kidney disease, unspecified    chronic renal insufficiency  . Dizziness and giddiness   . DVT (deep venous thrombosis) (HCC)    R  . Dyspnea    echo with normal EF. +diastolic dysfunction  . 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 HTN (hypertension)   . Hyperlipidemia   . Hypertonicity of bladder    hx  . Mild depression (HCC)   . Mononeuritis of lower limb, unspecified    peripheral neuropathy, bilateral  . Occlusion and stenosis of carotid artery without mention of cerebral infarction 4/09   u/s L 40-59%, R 0-39%; stable for many years  . Osteoarthrosis, unspecified whether generalized or localized, unspecified site   . Peripheral vascular disease, unspecified   . Polymyalgia rheumatica (HCC)    hx  . RUQ pain   . Urge incontinence    Past Surgical History:  Procedure Laterality Date  . 2D echo  8/06   mild aortic stenosis.   Marland Kitchen arterial doppler    . carotid doppler  4/02   bilateral plaque   . carotid doppler  8/06   non significant stenosis  . carotid doppler     R 40%; L 40-60%  . CATARACT EXTRACTION    . CHOLECYSTECTOMY  2000  . COLONOSCOPY  1/04   polyps  . CT SCAN  05/25/08   abd and pelvis- inflated urinary bladder and large amt stool throughout w/o blockage   . dexa  2/01   osteopenia  . echo     mild LVH EF 50% As  . triger finger contracture  6/04  . vert. basilar insufficiency  2002  . zoster  5/02   Family History  Problem Relation Age of Onset  . Hypertension Father   . Other Father     Cardiac problems  . Heart disease Father   . Other Brother     blood clot  . Other Sister     benign breast nodule   History  Sexual Activity  . Sexual activity: No    Outpatient Encounter Prescriptions as of 04/19/2016  Medication Sig  . acetaminophen (TYLENOL) 500 MG tablet Take 500 mg by mouth every 6 (six) hours as needed. OTC-UAD   . amLODipine (NORVASC) 5 MG tablet Take 1 tablet (5 mg total) by mouth daily.  Marland Kitchen aspirin 81 MG EC tablet Take 81 mg by mouth daily.    . ferrous sulfate 325 (65 FE) MG tablet Take 1 tablet (325 mg total) by mouth 2 (two) times daily.  Marland Kitchen  Meclizine HCl (BONINE PO) Take by mouth as needed.  . mirtazapine (REMERON) 30 MG tablet Take 1 tablet (30 mg total) by mouth at bedtime.  . NON FORMULARY Walker with wheels - for use with ambulation once daily. 429.9, 715.90, 355.9   . psyllium (METAMUCIL) 58.6 % packet Take 1 packet by mouth daily.  . simvastatin (ZOCOR) 20 MG tablet TAKE 1 TABLET (20 MG TOTAL) BY MOUTH DAILY.  Marland Kitchen. warfarin (COUMADIN) 5 MG tablet TAKE AS DIRECTED BY CLINIC   No facility-administered encounter medications on file as of 04/19/2016.     Activities of Daily Living In your present state of health, do you have any difficulty performing the following activities: 04/19/2016  Hearing? Y  Vision? N  Difficulty concentrating or making decisions? Y  Walking or climbing stairs? Y  Dressing or bathing? N  Doing errands, shopping? Y  Preparing Food and eating ? N  Using the Toilet? N  In the past six months, have you accidently leaked urine? N  Do you have problems with loss of  bowel control? Y  Managing your Medications? Y  Managing your Finances? Y  Housekeeping or managing your Housekeeping? Y  Some recent data might be hidden    Patient Care Team: Judy PimpleMarne A Tower, MD as PCP - General Blair PromiseNeill Bulakowski, OD as Consulting Physician (Optometry) Mosetta PigeonHarmeet Singh, MD as Consulting Physician (Internal Medicine) Orson ApeMichael R Wolff, MD as Consulting Physician (Urology) Max Maud Deed Hyatt, DPM as Consulting Physician (Podiatry) Fransico MichaelSarah C Pelletier, MD as Referring Physician (Audiology)    Assessment:    Hearing Screening Comments: Hearing aid in right ear Vision Screening Comments: Last vision exam with Dr. Druscilla BrownieNeill B. In May 2017  Exercise Activities and Dietary recommendations Current Exercise Habits: The patient does not participate in regular exercise at present, Exercise limited by: None identified  Goals    . Increase water intake          Starting 04/19/2016, I will attempt to drink at least 8 oz water with each meal.       Fall Risk Fall Risk  04/19/2016 04/17/2016 04/12/2015 11/19/2014 10/30/2013  Falls in the past year? Yes Yes Yes No Yes  Number falls in past yr: 1 1 1  - 2 or more  Injury with Fall? Yes No No - -  Risk Factor Category  - - - - High Fall Risk  Risk for fall due to : - - - - -  Follow up Falls evaluation completed - - - -   Depression Screen PHQ 2/9 Scores 04/19/2016 04/17/2016 04/12/2015 11/19/2014  PHQ - 2 Score 0 0 0 0     Cognitive Function MMSE - Mini Mental State Exam 04/19/2016  Orientation to time 5  Orientation to Place 5  Registration 3  Attention/ Calculation 0  Recall 3  Language- name 2 objects 0  Language- repeat 1  Language- follow 3 step command 3  Language- read & follow direction 0  Write a sentence 0  Copy design 0  Total score 20     PLEASE NOTE: A Mini-Cog screen was completed. Maximum score is 20. A value of 0 denotes this part of Folstein MMSE was not completed or the patient failed this part of the Mini-Cog  screening.   Mini-Cog Screening Orientation to Time - Max 5 pts Orientation to Place - Max 5 pts Registration - Max 3 pts Recall - Max 3 pts Language Repeat - Max 1 pts Language Follow 3 Step Command - Max  3 pts     Immunization History  Administered Date(s) Administered  . Influenza Split 03/08/2011, 02/25/2012  . Influenza Whole 05/05/2004, 02/27/2008, 03/03/2009, 02/24/2010  . Influenza,inj,Quad PF,36+ Mos 02/25/2013, 02/01/2014, 03/10/2015, 03/05/2016  . PPD Test 06/09/2014  . Pneumococcal Conjugate-13 10/30/2013  . Pneumococcal Polysaccharide-23 05/06/2002  . Td 05/06/2002  . Tdap 10/11/2011   Screening Tests Health Maintenance  Topic Date Due  . FOOT EXAM  07/06/2020 (Originally 09/19/1929)  . HEMOGLOBIN A1C  07/06/2020 (Originally 21-Nov-1919)  . URINE MICROALBUMIN  07/06/2020 (Originally 09/19/1929)  . ZOSTAVAX  07/06/2020 (Originally [birth year]/18/1981)  . OPHTHALMOLOGY EXAM  10/24/2016  . TETANUS/TDAP  10/10/2021  . INFLUENZA VACCINE  Completed  . DEXA SCAN  Completed  . PNA vac Low Risk Adult  Completed      Plan:     I have personally reviewed and addressed the Medicare Annual Wellness questionnaire and have noted the following in the patient's chart:  A. Medical and social history B. Use of alcohol, tobacco or illicit drugs  C. Current medications and supplements D. Functional ability and status E.  Nutritional status F.  Physical activity G. Advance directives H. List of other physicians I.  Hospitalizations, surgeries, and ER visits in previous 12 months J.  Vitals K. Screenings to include hearing, vision, cognitive, depression L. Referrals and appointments - none  In addition, I have reviewed and discussed with patient certain preventive protocols, quality metrics, and best practice recommendations. A written personalized care plan for preventive services as well as general preventive health recommendations were provided to patient.  See attached scanned  questionnaire for additional information.   Signed,   Randa EvensLesia Miyako Oelke, MHA, BS, LPN Health Coach

## 2016-06-01 ENCOUNTER — Ambulatory Visit (INDEPENDENT_AMBULATORY_CARE_PROVIDER_SITE_OTHER): Payer: Medicare HMO

## 2016-06-01 DIAGNOSIS — Z5181 Encounter for therapeutic drug level monitoring: Secondary | ICD-10-CM | POA: Diagnosis not present

## 2016-06-01 DIAGNOSIS — Z86718 Personal history of other venous thrombosis and embolism: Secondary | ICD-10-CM | POA: Insufficient documentation

## 2016-06-01 LAB — POCT INR: INR: 2.6

## 2016-06-01 NOTE — Patient Instructions (Signed)
Pre visit review using our clinic review tool, if applicable. No additional management support is needed unless otherwise documented below in the visit note. 

## 2016-06-29 ENCOUNTER — Ambulatory Visit (INDEPENDENT_AMBULATORY_CARE_PROVIDER_SITE_OTHER): Payer: Medicare Other

## 2016-06-29 DIAGNOSIS — Z7901 Long term (current) use of anticoagulants: Secondary | ICD-10-CM | POA: Diagnosis not present

## 2016-06-29 DIAGNOSIS — Z5181 Encounter for therapeutic drug level monitoring: Secondary | ICD-10-CM

## 2016-06-29 DIAGNOSIS — I82491 Acute embolism and thrombosis of other specified deep vein of right lower extremity: Secondary | ICD-10-CM | POA: Diagnosis not present

## 2016-06-29 LAB — POCT INR: INR: 1.5

## 2016-06-29 NOTE — Patient Instructions (Signed)
Pre visit review using our clinic review tool, if applicable. No additional management support is needed unless otherwise documented below in the visit note.  INR today 1.5  Patient cannot identify any change in diet, health or medications other than she feels like she missed at least 1 dose in the last couple of days.  Daughter, Maren ReamerMinerva, present and denies this account but no other indication for extremely subtherapuetic drop/ level.  Discussed risks associated with subtherapuetic level and to go to ER if any concerns arise.  As we are not sure what exactly happened and patient has been well controlled on this dosing for past several checks, I will boost her today and tomorrow and then have her resume prior dosing of 2.5mg  daily except for 5mg  on Tuesday and Friday and recheck in 1.5 weeks on 07/10/16.  Spoke with patient and daughter on importance of taking medication daily as prescribed. Both, verbalize understanding of all instructions given today.

## 2016-07-02 ENCOUNTER — Encounter: Payer: Self-pay | Admitting: Podiatry

## 2016-07-02 ENCOUNTER — Ambulatory Visit (INDEPENDENT_AMBULATORY_CARE_PROVIDER_SITE_OTHER): Payer: Medicare Other | Admitting: Podiatry

## 2016-07-02 DIAGNOSIS — Q828 Other specified congenital malformations of skin: Secondary | ICD-10-CM

## 2016-07-02 DIAGNOSIS — E1149 Type 2 diabetes mellitus with other diabetic neurological complication: Secondary | ICD-10-CM | POA: Diagnosis not present

## 2016-07-02 DIAGNOSIS — B351 Tinea unguium: Secondary | ICD-10-CM | POA: Diagnosis not present

## 2016-07-02 DIAGNOSIS — M79676 Pain in unspecified toe(s): Secondary | ICD-10-CM | POA: Diagnosis not present

## 2016-07-02 NOTE — Progress Notes (Signed)
Complaint:  Visit Type: Patient returns to my office for continued preventative foot care services. Complaint: Patient states" my nails have grown long and thick and become painful to walk and wear shoes" Patient has been diagnosed with DM with no foot complications. The patient presents for preventative foot care services. No changes to ROS  Podiatric Exam: Vascular: dorsalis pedis and posterior tibial pulses are palpable bilateral. Capillary return is immediate. Temperature gradient is WNL. Skin turgor WNL  Sensorium: Diminished  Semmes Weinstein monofilament test. Normal tactile sensation bilaterally. Nail Exam: Pt has thick disfigured discolored nails with subungual debris noted bilateral entire nail hallux through fifth toenails Ulcer Exam: There is no evidence of ulcer or pre-ulcerative changes or infection. Orthopedic Exam: Muscle tone and strength are WNL. No limitations in general ROM. No crepitus or effusions noted. Foot type and digits show no abnormalities.  Severe HAV  B/L with hammer toes.   Skin:  Porokeratosis sub 1 right foot. No infection or ulcers  Diagnosis:  Onychomycosis, , Pain in right toe, pain in left toes  Treatment & Plan Procedures and Treatment: Consent by patient was obtained for treatment procedures. The patient understood the discussion of treatment and procedures well. All questions were answered thoroughly reviewed. Debridement of mycotic and hypertrophic toenails, 1 through 5 bilateral and clearing of subungual debris. No ulceration, no infection noted.  Return Visit-Office Procedure: Patient instructed to return to the office for a follow up visit 3 months for continued evaluation and treatment.    Aily Tzeng DPM 

## 2016-07-10 ENCOUNTER — Ambulatory Visit (INDEPENDENT_AMBULATORY_CARE_PROVIDER_SITE_OTHER): Payer: Medicare Other

## 2016-07-10 DIAGNOSIS — Z5181 Encounter for therapeutic drug level monitoring: Secondary | ICD-10-CM

## 2016-07-10 DIAGNOSIS — I82491 Acute embolism and thrombosis of other specified deep vein of right lower extremity: Secondary | ICD-10-CM | POA: Diagnosis not present

## 2016-07-10 DIAGNOSIS — Z7901 Long term (current) use of anticoagulants: Secondary | ICD-10-CM

## 2016-07-10 LAB — POCT INR: INR: 2.1

## 2016-07-10 NOTE — Patient Instructions (Addendum)
Pre visit review using our clinic review tool, if applicable. No additional management support is needed unless otherwise documented below in the visit note.  INR today 2.1  Patient reports eating greens over the weekend but did not miss any doses and has had no other changes in medications.  As this is 2nd time coming in at subtherapeutic range will have patient take an extra 1/2 pill (7.5mg ) today and then increase weekly dosing to 1/2 pill (2.5mg ) daily EXCEPT for 5mg  on Sundays, Tuesdays and Fridays now.  Recheck in 2 weeks.  Patient educated on decreasing but being consistent with greens consumption and reviewed risks associated with subtherapeutic level with patient and grandson, Clint, today.  Grandson manages patient pill box and verbalizes understanding of all instructions given today.

## 2016-07-11 ENCOUNTER — Telehealth: Payer: Self-pay

## 2016-07-11 NOTE — Telephone Encounter (Signed)
[name] wants to verify that pt is utd on immunizations; advised per 04/17/16 annual exam notes pt is utd on immunizations; pt did not want to get shingles vaccine due to expense; [name] voiced understanding.

## 2016-07-24 ENCOUNTER — Ambulatory Visit (INDEPENDENT_AMBULATORY_CARE_PROVIDER_SITE_OTHER): Payer: Medicare Other

## 2016-07-24 DIAGNOSIS — I82491 Acute embolism and thrombosis of other specified deep vein of right lower extremity: Secondary | ICD-10-CM | POA: Diagnosis not present

## 2016-07-24 DIAGNOSIS — Z5181 Encounter for therapeutic drug level monitoring: Secondary | ICD-10-CM

## 2016-07-24 DIAGNOSIS — Z7901 Long term (current) use of anticoagulants: Secondary | ICD-10-CM

## 2016-07-24 LAB — POCT INR: INR: 2.2

## 2016-07-24 NOTE — Patient Instructions (Signed)
Pre visit review using our clinic review tool, if applicable. No additional management support is needed unless otherwise documented below in the visit note.  INR today 2.2  Patient's grandson reports that she missed a 1 whole pill day this past Friday.  Otherwise, there have been no changes in her medications, diet or general health and she reports doing very well.  Will have patient take an extra 1/2 pill (7.5mg ) today 2/20 and then continue taking the previous increased dosing of 1/2 pill (2.5mg ) daily EXCEPT for 1 pill (5mg ) on Sunday, Tuesday and Friday.  Recheck in 2 weeks.  Patient and grandson verbalize understanding of all instructions given today and risks associated with subtherapeutic level.

## 2016-07-25 ENCOUNTER — Ambulatory Visit (INDEPENDENT_AMBULATORY_CARE_PROVIDER_SITE_OTHER): Payer: Medicare Other | Admitting: Family Medicine

## 2016-07-25 ENCOUNTER — Encounter: Payer: Self-pay | Admitting: Family Medicine

## 2016-07-25 VITALS — BP 122/68 | HR 69 | Temp 97.4°F | Wt 142.0 lb

## 2016-07-25 DIAGNOSIS — N3000 Acute cystitis without hematuria: Secondary | ICD-10-CM | POA: Diagnosis not present

## 2016-07-25 DIAGNOSIS — N189 Chronic kidney disease, unspecified: Secondary | ICD-10-CM | POA: Diagnosis not present

## 2016-07-25 DIAGNOSIS — R8290 Unspecified abnormal findings in urine: Secondary | ICD-10-CM

## 2016-07-25 DIAGNOSIS — N390 Urinary tract infection, site not specified: Secondary | ICD-10-CM | POA: Insufficient documentation

## 2016-07-25 LAB — POC URINALSYSI DIPSTICK (AUTOMATED)
Bilirubin, UA: NEGATIVE
GLUCOSE UA: NEGATIVE
Ketones, UA: NEGATIVE
Nitrite, UA: POSITIVE
Urobilinogen, UA: NEGATIVE
pH, UA: 5

## 2016-07-25 MED ORDER — CEPHALEXIN 250 MG PO CAPS: 250.0000 mg | ORAL_CAPSULE | Freq: Two times a day (BID) | ORAL | 0 refills | Status: DC

## 2016-07-25 NOTE — Patient Instructions (Signed)
I think you have a uti  Drink lots of fluids Take the keflex as directed  Follow up with your kidney doctor as planned  Update if not starting to improve in a week or if worsening    We will contact you with urine culture results when they come back

## 2016-07-25 NOTE — Progress Notes (Signed)
Subjective:    Patient ID: Tracy Morales, female    DOB: May 13, 1920, 83 y.o.   MRN: [id number]  HPI Here with low back pain and urine odor -suspects uti   Started low back pain about 2 weeks ago (more on the left)  Sharp pain in low back on and off  She is urinating frequently and drinking lots of water  Urine smells bad as well  No pink or red urine  No fever  No n/v at all   No bladder pain or fullness  She self caths    More tired than usual  A bit confused off and on per caregiver   Wt Readings from Last 3 Encounters:  07/25/16 142 lb (64.4 kg)  04/19/16 134 lb 8 oz (61 kg)  04/17/16 135 lb 4 oz (61.3 kg)   Appetite is good   Some dry cough lately   ua is pos today  Results for orders placed or performed in visit on 07/25/16  POCT Urinalysis Dipstick (Automated)  Result Value Ref Range   Color, UA yellow    Clarity, UA cloudy    Glucose, UA neg    Bilirubin, UA neg    Ketones, UA neg    Spec Grav, UA >=1.030    Blood, UA small    pH, UA 5.0    Protein, UA trace    Urobilinogen, UA negative    Nitrite, UA positive    Leukocytes, UA large (3+) (A) Negative      Wt Readings from Last 3 Encounters:  07/25/16 142 lb (64.4 kg)  04/19/16 134 lb 8 oz (61 kg)  04/17/16 135 lb 4 oz (61.3 kg)   BP Readings from Last 3 Encounters:  07/25/16 122/68  04/19/16 120/60  04/17/16 136/70    Last uti was April 2017-had Klebsiella   Patient Active Problem List   Diagnosis Date Noted  . UTI (urinary tract infection) 07/25/2016  . DVT of leg (deep venous thrombosis) (HCC) 06/01/2016  . Encounter for therapeutic drug monitoring 06/01/2016  . Right leg pain 04/17/2016  . Osteoarthritis of both hips 04/15/2015  . Hip pain 04/14/2015  . Routine general medical examination at a health care facility 04/12/2015  . Hair loss 11/19/2014  . Loss of weight 06/09/2014  . Arthritis of right shoulder region 06/09/2014  . Nodule of left lung 06/09/2014  . Encounter for  Medicare annual wellness exam 10/30/2013  . H/O fall 04/27/2013  . Risk for falls 04/11/2012  . Leg pain, bilateral 01/29/2012  . MICROSCOPIC HEMATURIA 08/29/2009  . CONDUCTIVE HEARING LOSS BILATERAL 08/08/2009  . DVT 04/12/2009  . EDEMA 04/07/2009  . HYPERPOTASSEMIA 01/25/2009  . BRADYCARDIA 10/26/2008  . DYSPNEA 10/26/2008  . INCONTINENCE, URGE 02/27/2008  . Hyperlipidemia 10/11/2006  . PERIPHERAL NEUROPATHY, LOWER EXTREMITIES, BILATERAL 10/11/2006  . Essential hypertension 10/11/2006  . DIASTOLIC DYSFUNCTION 10/11/2006  . CAROTID STENOSIS 10/11/2006  . CEREBROVASCULAR DISEASE 10/11/2006  . PERIPHERAL VASCULAR DISEASE 10/11/2006  . ALLERGIC RHINITIS 10/11/2006  . Chronic kidney disease 10/11/2006  . OVERACTIVE BLADDER 10/11/2006  . OSTEOARTHRITIS 10/11/2006  . POLYMYALGIA RHEUMATICA, HX OF 10/11/2006  . CARDIAC MURMUR, HX OF 10/11/2006   Past Medical History:  Diagnosis Date  . Allergic rhinitis, cause unspecified   . Bradycardia   . Cerebrovascular disease, unspecified   . Chronic kidney disease, unspecified    chronic renal insufficiency  . Dizziness and giddiness   . DVT (deep venous thrombosis) (HCC)    R  .  Dyspnea    echo with normal EF. +diastolic dysfunction  . 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 HTN (hypertension)   . Hyperlipidemia   . Hypertonicity of bladder    hx  . Mild depression (HCC)   . Mononeuritis of lower limb, unspecified    peripheral neuropathy, bilateral  . Occlusion and stenosis of carotid artery without mention of cerebral infarction 4/09   u/s L 40-59%, R 0-39%; stable for many years  . Osteoarthrosis, unspecified whether generalized or localized, unspecified site   . Peripheral vascular disease, unspecified   . Polymyalgia rheumatica (HCC)    hx  . RUQ pain   . Urge incontinence    Past Surgical History:  Procedure Laterality Date  . 2D echo  8/06   mild aortic stenosis.   Marland Kitchen. arterial doppler    . carotid doppler   4/02   bilateral plaque   . carotid doppler  8/06   non significant stenosis  . carotid doppler     R 40%; L 40-60%  . CATARACT EXTRACTION    . CHOLECYSTECTOMY  2000  . COLONOSCOPY  1/04   polyps  . CT SCAN  05/25/08   abd and pelvis- inflated urinary bladder and large amt stool throughout w/o blockage   . dexa  2/01   osteopenia  . echo     mild LVH EF 50% As  . triger finger contracture  6/04  . vert. basilar insufficiency  2002  . zoster  5/02   Social History  Substance Use Topics  . Smoking status: Never Smoker  . Smokeless tobacco: Never Used     Comment: non smoker   . Alcohol use No   Family History  Problem Relation Age of Onset  . Hypertension Father   . Other Father     Cardiac problems  . Heart disease Father   . Other Brother     blood clot  . Other Sister     benign breast nodule   Allergies  Allergen Reactions  . Ace Inhibitors     REACTION: cough   Current Outpatient Prescriptions on File Prior to Visit  Medication Sig Dispense Refill  . acetaminophen (TYLENOL) 500 MG tablet Take 500 mg by mouth every 6 (six) hours as needed. OTC-UAD     . amLODipine (NORVASC) 5 MG tablet Take 1 tablet (5 mg total) by mouth daily. 30 tablet 11  . aspirin 81 MG EC tablet Take 81 mg by mouth daily.      . ferrous sulfate 325 (65 FE) MG tablet Take 1 tablet (325 mg total) by mouth 2 (two) times daily. 60 tablet 11  . Meclizine HCl (BONINE PO) Take by mouth as needed.    . mirtazapine (REMERON) 30 MG tablet Take 1 tablet (30 mg total) by mouth at bedtime. 30 tablet 11  . NON FORMULARY Walker with wheels - for use with ambulation once daily. 429.9, 715.90, 355.9     . psyllium (METAMUCIL) 58.6 % packet Take 1 packet by mouth daily.    . simvastatin (ZOCOR) 20 MG tablet TAKE 1 TABLET (20 MG TOTAL) BY MOUTH DAILY. 30 tablet 11  . warfarin (COUMADIN) 5 MG tablet TAKE AS DIRECTED BY CLINIC 60 tablet 3   No current facility-administered medications on file prior to visit.      Review of Systems  Constitutional: Positive for fatigue. Negative for activity change, appetite change and fever.  HENT: Negative for congestion and  sore throat.   Eyes: Negative for itching and visual disturbance.  Respiratory: Positive for cough. Negative for shortness of breath.   Cardiovascular: Negative for leg swelling.  Gastrointestinal: Negative for abdominal distention, abdominal pain, constipation, diarrhea and nausea.  Endocrine: Negative for cold intolerance and polydipsia.  Genitourinary: Positive for dysuria, frequency and urgency. Negative for difficulty urinating, flank pain and hematuria.  Musculoskeletal: Negative for myalgias.  Skin: Negative for rash.  Allergic/Immunologic: Negative for immunocompromised state.  Neurological: Negative for dizziness and weakness.  Hematological: Negative for adenopathy.       Objective:   Physical Exam  Constitutional: She appears well-developed and well-nourished. No distress.  Frail appearing elderly female examined in chair (mobility impaired)   HENT:  Head: Normocephalic and atraumatic.  Eyes: Conjunctivae and EOM are normal. Pupils are equal, round, and reactive to light.  Neck: Normal range of motion. Neck supple.  Cardiovascular: Normal rate, regular rhythm and normal heart sounds.   Pulmonary/Chest: Effort normal and breath sounds normal. No respiratory distress. She has no wheezes. She has no rales. She exhibits no tenderness.  Abdominal: Soft. Bowel sounds are normal. She exhibits no distension. There is tenderness. There is no rebound.  No cva tenderness  Mild suprapubic tenderness  Musculoskeletal: She exhibits no edema.  Lymphadenopathy:    She has no cervical adenopathy.  Neurological: She is alert.  Skin: No rash noted. No pallor.  Psychiatric: She has a normal mood and affect.          Assessment & Plan:   Problem List Items Addressed This Visit      Genitourinary   Chronic kidney disease     For renal f/u soon Current uti- cover with keflex Pt does self cath      UTI (urinary tract infection)    Acute cystitis -uncomplicated in pt who self caths and has renal insuff Dose with keflex 250 mg bid  Enc inc water intake  Urine cx sent  Update if not starting to improve in a several days  or if worsening        Relevant Medications   cephALEXin (KEFLEX) 250 MG capsule   Other Relevant Orders   Urine culture    Other Visit Diagnoses    Abnormal urine odor    -  Primary   Relevant Orders   POCT Urinalysis Dipstick (Automated) (Completed)

## 2016-07-26 NOTE — Assessment & Plan Note (Signed)
For renal f/u soon Current uti- cover with keflex Pt does self cath

## 2016-07-26 NOTE — Assessment & Plan Note (Signed)
Acute cystitis -uncomplicated in pt who self caths and has renal insuff Dose with keflex 250 mg bid  Enc inc water intake  Urine cx sent  Update if not starting to improve in a several days  or if worsening

## 2016-07-28 LAB — URINE CULTURE

## 2016-08-07 ENCOUNTER — Ambulatory Visit (INDEPENDENT_AMBULATORY_CARE_PROVIDER_SITE_OTHER): Payer: Medicare Other

## 2016-08-07 DIAGNOSIS — I82491 Acute embolism and thrombosis of other specified deep vein of right lower extremity: Secondary | ICD-10-CM | POA: Diagnosis not present

## 2016-08-07 DIAGNOSIS — Z7901 Long term (current) use of anticoagulants: Secondary | ICD-10-CM

## 2016-08-07 DIAGNOSIS — Z5181 Encounter for therapeutic drug level monitoring: Secondary | ICD-10-CM

## 2016-08-07 LAB — POCT INR: INR: 2.2

## 2016-08-07 NOTE — Patient Instructions (Addendum)
Pre visit review using our clinic review tool, if applicable. No additional management support is needed unless otherwise documented below in the visit note.  INR today 2.2  Patient and grandson deny any missed doses over past 2 week period and are able to confirm accurate coumadin dosing schedule.  In addition, they deny any diet, meds or health changes other than on 10 days of abx (keflex) for UTI (finished on 08/02/16).  There is no evidence of potential interaction between Keflex and Warfarin per drug interaction tool and level has been unchanged since last INR check.  Instructions given today to take 1 1/2 pills (7.5mg ) today 3/6 and then increase weekly dose to taking 1 (5mg ) pill daily EXCEPT for 1/2 pill (2.5mg ) on Mondays, Wednesdays and Fridays.  Recheck in 2 weeks.  Patient and grandson verbalize understanding of dosage instructions and risks associated with subtherapeutic levels.

## 2016-08-21 ENCOUNTER — Ambulatory Visit: Payer: Medicare Other

## 2016-08-24 ENCOUNTER — Ambulatory Visit (INDEPENDENT_AMBULATORY_CARE_PROVIDER_SITE_OTHER): Payer: Medicare Other

## 2016-08-24 DIAGNOSIS — Z7901 Long term (current) use of anticoagulants: Secondary | ICD-10-CM | POA: Diagnosis not present

## 2016-08-24 DIAGNOSIS — Z5181 Encounter for therapeutic drug level monitoring: Secondary | ICD-10-CM

## 2016-08-24 DIAGNOSIS — I82491 Acute embolism and thrombosis of other specified deep vein of right lower extremity: Secondary | ICD-10-CM | POA: Diagnosis not present

## 2016-08-24 LAB — POCT INR: INR: 3.5

## 2016-08-24 NOTE — Patient Instructions (Signed)
Pre visit review using our clinic review tool, if applicable. No additional management support is needed unless otherwise documented below in the visit note. 

## 2016-09-14 ENCOUNTER — Ambulatory Visit (INDEPENDENT_AMBULATORY_CARE_PROVIDER_SITE_OTHER): Payer: Medicare Other

## 2016-09-14 ENCOUNTER — Ambulatory Visit: Payer: Medicare Other

## 2016-09-14 DIAGNOSIS — Z5181 Encounter for therapeutic drug level monitoring: Secondary | ICD-10-CM | POA: Diagnosis not present

## 2016-09-14 DIAGNOSIS — Z7901 Long term (current) use of anticoagulants: Secondary | ICD-10-CM

## 2016-09-14 DIAGNOSIS — I82491 Acute embolism and thrombosis of other specified deep vein of right lower extremity: Secondary | ICD-10-CM

## 2016-09-14 LAB — POCT INR: INR: 2.8

## 2016-09-14 NOTE — Patient Instructions (Signed)
Pre visit review using our clinic review tool, if applicable. No additional management support is needed unless otherwise documented below in the visit note. 

## 2016-10-12 ENCOUNTER — Ambulatory Visit (INDEPENDENT_AMBULATORY_CARE_PROVIDER_SITE_OTHER): Payer: Medicare Other

## 2016-10-12 DIAGNOSIS — Z7901 Long term (current) use of anticoagulants: Secondary | ICD-10-CM | POA: Diagnosis not present

## 2016-10-12 DIAGNOSIS — Z5181 Encounter for therapeutic drug level monitoring: Secondary | ICD-10-CM | POA: Diagnosis not present

## 2016-10-12 DIAGNOSIS — I82491 Acute embolism and thrombosis of other specified deep vein of right lower extremity: Secondary | ICD-10-CM

## 2016-10-12 LAB — POCT INR: INR: 4.1

## 2016-10-12 NOTE — Patient Instructions (Signed)
Pre visit review using our clinic review tool, if applicable. No additional management support is needed unless otherwise documented below in the visit note.  INR today 4.1  Patient denies any bruising, bleeding or changes in diet, health or medications.  She has a daughter and son who manage her medications and pill box.  Daughter present today and also denies any concerning changes.    Patient is to hold coumadin tomorrow (5/12) as reports already taken for today.  Then decrease weekly dose to taking 1/2 pill (2.5mg ) daily EXCEPT for 1 pill (5mg ) on Sunday, Tuesdays, and Thursdays.  Recheck in 3 weeks.  Instructions reviewed with daughter and patient.  Both, verbalize understanding of info given today and risks associated with supratherpeutic level and will go to ER if any concerns develop.

## 2016-10-15 ENCOUNTER — Ambulatory Visit (INDEPENDENT_AMBULATORY_CARE_PROVIDER_SITE_OTHER): Payer: Medicare Other | Admitting: Podiatry

## 2016-10-15 DIAGNOSIS — B351 Tinea unguium: Secondary | ICD-10-CM | POA: Diagnosis not present

## 2016-10-15 DIAGNOSIS — M79676 Pain in unspecified toe(s): Secondary | ICD-10-CM

## 2016-10-15 DIAGNOSIS — E1149 Type 2 diabetes mellitus with other diabetic neurological complication: Secondary | ICD-10-CM

## 2016-10-15 DIAGNOSIS — Q828 Other specified congenital malformations of skin: Secondary | ICD-10-CM

## 2016-10-15 NOTE — Progress Notes (Signed)
Complaint:  Visit Type: Patient returns to my office for continued preventative foot care services. Complaint: Patient states" my nails have grown long and thick and become painful to walk and wear shoes" Patient has been diagnosed with DM with no foot complications. The patient presents for preventative foot care services. No changes to ROS  Podiatric Exam: Vascular: dorsalis pedis and posterior tibial pulses are palpable bilateral. Capillary return is immediate. Temperature gradient is WNL. Skin turgor WNL  Sensorium: Diminished  Semmes Weinstein monofilament test. Normal tactile sensation bilaterally. Nail Exam: Pt has thick disfigured discolored nails with subungual debris noted bilateral entire nail hallux through fifth toenails Ulcer Exam: There is no evidence of ulcer or pre-ulcerative changes or infection. Orthopedic Exam: Muscle tone and strength are WNL. No limitations in general ROM. No crepitus or effusions noted. Foot type and digits show no abnormalities.  Severe HAV  B/L with hammer toes.   Skin:  Porokeratosis sub 1 right foot. No infection or ulcers  Diagnosis:  Onychomycosis, , Pain in right toe, pain in left toes  Treatment & Plan Procedures and Treatment: Consent by patient was obtained for treatment procedures. The patient understood the discussion of treatment and procedures well. All questions were answered thoroughly reviewed. Debridement of mycotic and hypertrophic toenails, 1 through 5 bilateral and clearing of subungual debris. No ulceration, no infection noted.  Return Visit-Office Procedure: Patient instructed to return to the office for a follow up visit 3 months for continued evaluation and treatment.    Taresa Montville DPM 

## 2016-11-02 ENCOUNTER — Ambulatory Visit (INDEPENDENT_AMBULATORY_CARE_PROVIDER_SITE_OTHER): Payer: Medicare Other

## 2016-11-02 DIAGNOSIS — Z7901 Long term (current) use of anticoagulants: Secondary | ICD-10-CM

## 2016-11-02 DIAGNOSIS — Z5181 Encounter for therapeutic drug level monitoring: Secondary | ICD-10-CM

## 2016-11-02 DIAGNOSIS — I82491 Acute embolism and thrombosis of other specified deep vein of right lower extremity: Secondary | ICD-10-CM | POA: Diagnosis not present

## 2016-11-02 LAB — POCT INR: INR: 4

## 2016-11-02 NOTE — Patient Instructions (Addendum)
Pre visit review using our clinic review tool, if applicable. No additional management support is needed unless otherwise documented below in the visit note.  INR today 4.0  Reviewed with patient and grandson any potential changes in meds, diet or health history.  No changes to report; however, when I review past labs from 6 months ago I see patient has history of increasing renal impairment.  I do have questions as to whether this may be playing a role in her ongoing supratherapeutic level and the reason why she is requiring less medication to be safely maintained. If trend continues will review this with her PCP, Dr. Milinda Antisower.    Grandson was able to verbalize back accurate dosing schedule as he assists her with her medications.  Patient is to hold her coumadin tomorrow (6/2) as has already taken today.  Then decrease weekly dosing to 1/2 pill (2.5mg ) daily EXCEPT for 1 pill (5mg ) on Sundays and Thursdays.  Recheck in 2 weeks.  Patient also informed okay to eat limited but consistent twice weekly green consumption.  Grandson and patient verbalize understanding of instructions given today and will go to ER if any concerns develop.  Patient and grandson deny any unusual bleeding or bruising and state understanding of risks involved with today's elevated reading.

## 2016-11-07 ENCOUNTER — Emergency Department: Payer: Medicare Other

## 2016-11-07 ENCOUNTER — Emergency Department
Admission: EM | Admit: 2016-11-07 | Discharge: 2016-11-07 | Disposition: A | Discharge status: Home / Self Care | Payer: Medicare Other | Attending: Emergency Medicine | Admitting: Emergency Medicine

## 2016-11-07 DIAGNOSIS — N189 Chronic kidney disease, unspecified: Secondary | ICD-10-CM | POA: Insufficient documentation

## 2016-11-07 DIAGNOSIS — Y999 Unspecified external cause status: Secondary | ICD-10-CM | POA: Insufficient documentation

## 2016-11-07 DIAGNOSIS — Y929 Unspecified place or not applicable: Secondary | ICD-10-CM | POA: Diagnosis not present

## 2016-11-07 DIAGNOSIS — S20221A Contusion of right back wall of thorax, initial encounter: Secondary | ICD-10-CM

## 2016-11-07 DIAGNOSIS — I129 Hypertensive chronic kidney disease with stage 1 through stage 4 chronic kidney disease, or unspecified chronic kidney disease: Secondary | ICD-10-CM | POA: Insufficient documentation

## 2016-11-07 DIAGNOSIS — Y939 Activity, unspecified: Secondary | ICD-10-CM | POA: Insufficient documentation

## 2016-11-07 DIAGNOSIS — W1830XA Fall on same level, unspecified, initial encounter: Secondary | ICD-10-CM | POA: Insufficient documentation

## 2016-11-07 DIAGNOSIS — W19XXXA Unspecified fall, initial encounter: Secondary | ICD-10-CM

## 2016-11-07 DIAGNOSIS — S20229A Contusion of unspecified back wall of thorax, initial encounter: Secondary | ICD-10-CM | POA: Diagnosis not present

## 2016-11-07 DIAGNOSIS — S3992XA Unspecified injury of lower back, initial encounter: Secondary | ICD-10-CM | POA: Diagnosis present

## 2016-11-07 MED ORDER — ACETAMINOPHEN 500 MG PO TABS
1000.0000 mg | ORAL_TABLET | Freq: Once | ORAL | Status: AC
  Administered 2016-11-07: 1000 mg via ORAL
  Filled 2016-11-07: qty 2

## 2016-11-07 NOTE — ED Provider Notes (Signed)
[facility] Emergency Department Provider Note       Time seen: ----------------------------------------- 5:27 PM on 11/07/2016 -----------------------------------------     I have reviewed the triage vital signs and the nursing notes.   HISTORY   Chief Complaint Fall    HPI Tracy Morales is a 83 y.o. female who presents to the ED for unwitnessed fall today. Patient does not have any complaints or any obvious injuries. EMS came to the house and recommended she be seen due to high blood pressure when it was checked. Patient was standing in and fully clothes when she fell. Patient did not have any plants earlier but this is currently complaining of some right flank pain.   Past Medical History:  Diagnosis Date  . Allergic rhinitis, cause unspecified   . Bradycardia   . Cerebrovascular disease, unspecified   . Chronic kidney disease, unspecified    chronic renal insufficiency  . Dizziness and giddiness   . DVT (deep venous thrombosis) (HCC)    R  . Dyspnea    echo with normal EF. +diastolic dysfunction  . 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 HTN (hypertension)   . Hyperlipidemia   . Hypertonicity of bladder    hx  . Mild depression (HCC)   . Mononeuritis of lower limb, unspecified    peripheral neuropathy, bilateral  . Occlusion and stenosis of carotid artery without mention of cerebral infarction 4/09   u/s L 40-59%, R 0-39%; stable for many years  . Osteoarthrosis, unspecified whether generalized or localized, unspecified site   . Peripheral vascular disease, unspecified (HCC)   . Polymyalgia rheumatica (HCC)    hx  . RUQ pain   . Urge incontinence     Patient Active Problem List   Diagnosis Date Noted  . UTI (urinary tract infection) 07/25/2016  . DVT of leg (deep venous thrombosis) (HCC) 06/01/2016  . Encounter for therapeutic drug monitoring 06/01/2016  . Right leg pain 04/17/2016  . Osteoarthritis of both hips  04/15/2015  . Hip pain 04/14/2015  . Routine general medical examination at a health care facility 04/12/2015  . Hair loss 11/19/2014  . Loss of weight 06/09/2014  . Arthritis of right shoulder region 06/09/2014  . Nodule of left lung 06/09/2014  . Encounter for Medicare annual wellness exam 10/30/2013  . H/O fall 04/27/2013  . Risk for falls 04/11/2012  . Leg pain, bilateral 01/29/2012  . MICROSCOPIC HEMATURIA 08/29/2009  . CONDUCTIVE HEARING LOSS BILATERAL 08/08/2009  . DVT 04/12/2009  . EDEMA 04/07/2009  . HYPERPOTASSEMIA 01/25/2009  . BRADYCARDIA 10/26/2008  . DYSPNEA 10/26/2008  . INCONTINENCE, URGE 02/27/2008  . Hyperlipidemia 10/11/2006  . PERIPHERAL NEUROPATHY, LOWER EXTREMITIES, BILATERAL 10/11/2006  . Essential hypertension 10/11/2006  . DIASTOLIC DYSFUNCTION 10/11/2006  . CAROTID STENOSIS 10/11/2006  . CEREBROVASCULAR DISEASE 10/11/2006  . PERIPHERAL VASCULAR DISEASE 10/11/2006  . ALLERGIC RHINITIS 10/11/2006  . Chronic kidney disease 10/11/2006  . OVERACTIVE BLADDER 10/11/2006  . OSTEOARTHRITIS 10/11/2006  . POLYMYALGIA RHEUMATICA, HX OF 10/11/2006  . CARDIAC MURMUR, HX OF 10/11/2006    Past Surgical History:  Procedure Laterality Date  . 2D echo  8/06   mild aortic stenosis.   Marland Kitchen. arterial doppler    . carotid doppler  4/02   bilateral plaque   . carotid doppler  8/06   non significant stenosis  . carotid doppler     R 40%; L 40-60%  . CATARACT EXTRACTION    . CHOLECYSTECTOMY  2000  . COLONOSCOPY  1/04   polyps  . CT SCAN  05/25/08   abd and pelvis- inflated urinary bladder and large amt stool throughout w/o blockage   . dexa  2/01   osteopenia  . echo     mild LVH EF 50% As  . triger finger contracture  6/04  . vert. basilar insufficiency  2002  . zoster  5/02    Allergies Ace inhibitors  Social History Social History  Substance Use Topics  . Smoking status: Never Smoker  . Smokeless tobacco: Never Used     Comment: non smoker   .  Alcohol use No    Review of Systems Constitutional: Negative for fever. Eyes: Negative for vision changes ENT:  Negative for congestion, sore throat Cardiovascular: Negative for chest pain. Respiratory: Negative for shortness of breath. Gastrointestinal: Positive for flank pain Genitourinary: Negative for dysuria. Musculoskeletal: Negative for back pain. Skin: Negative for rash. Neurological: Negative for headaches, focal weakness or numbness.  All systems negative/normal/unremarkable except as stated in the HPI  ____________________________________________   PHYSICAL EXAM:  VITAL SIGNS: ED Triage Vitals  Enc Vitals Group     BP 11/07/16 1608 (!) 180/68     Pulse Rate 11/07/16 1608 73     Resp 11/07/16 1608 18     Temp 11/07/16 1608 97.8 F (36.6 C)     Temp Source 11/07/16 1608 Oral     SpO2 11/07/16 1608 99 %     Weight 11/07/16 1606 140 lb (63.5 kg)     Height --      Head Circumference --      Peak Flow --      Pain Score --      Pain Loc --      Pain Edu? --      Excl. in GC? --     Constitutional: Alert and oriented. Well appearing and in no distress. Eyes: Conjunctivae are normal. Normal extraocular movements. ENT   Head: Normocephalic and atraumatic.   Nose: No congestion/rhinnorhea.   Mouth/Throat: Mucous membranes are moist.   Neck: No stridor. Cardiovascular: Normal rate, regular rhythm. No murmurs, rubs, or gallops. Respiratory: Normal respiratory effort without tachypnea nor retractions. Breath sounds are clear and equal bilaterally. No wheezes/rales/rhonchi. Gastrointestinal: Soft and nontender. Normal bowel sounds Musculoskeletal: Nontender with normal range of motion in extremities. No lower extremity tenderness nor edema.Mild right inferior/posterior rib tenderness Neurologic:  Normal speech and language. No gross focal neurologic deficits are appreciated.  Skin:  Skin is warm, dry and intact. No rash noted. Psychiatric: Mood and  affect are normal. Speech and behavior are normal.  ____________________________________________  ED COURSE:  Pertinent labs & imaging results that were available during my care of the patient were reviewed by me and considered in my medical decision making (see chart for details). Patient presents for flank pain from a fall, we will assess with imaging as indicated.   Procedures ____________________________________________   RADIOLOGY Images were viewed by me  Rib x-rays, lumbar spine x-rays IMPRESSION: 1. Negative for displaced fracture or pneumothorax. IMPRESSION: 1. Six non rib-bearing lumbar vertebrae. As there are 12 rib-bearing thoracic vertebrae, these are labeled L1 through L6. 2. No acute osseous abnormalities. 3. Stable remote compression fracture of L1 on the order of 40-50% or so. (Please note that this vertebra was incorrectly labeled T12 on the prior lumbar spine x-rays from 2016). 4. Severe degenerative disc disease at L5-6 and L6-S1. Moderate degenerative disc disease at L4-5. ____________________________________________  FINAL ASSESSMENT AND PLAN  Fall, contusion  Plan: Patient's labs and imaging were dictated above. Patient had presented for a fall from standing position. She did not hit her head and she has no complaints at this time. She had some mild right low back soreness but has unremarkable x-rays. She is stable for discharge with outpatient follow-up.   Emily Filbert, MD   Note: This note was generated in part or whole with voice recognition software. Voice recognition is usually quite accurate but there are transcription errors that can and very often do occur. I apologize for any typographical errors that were not detected and corrected.     Emily Filbert, MD 11/07/16 [phone]

## 2016-11-07 NOTE — ED Triage Notes (Signed)
Pt arrives to ER via POV after unwitnessed fall today. Pt not complaining of any pain. No obvious injures. EMS came to house and recommended that pt be seen at ER due to high blood pressure when it was checked. PT was standing and folding clothes when she fell. Pt unsure if she tripped and fell or not.

## 2016-11-16 ENCOUNTER — Ambulatory Visit (INDEPENDENT_AMBULATORY_CARE_PROVIDER_SITE_OTHER): Payer: Medicare Other

## 2016-11-16 ENCOUNTER — Ambulatory Visit (INDEPENDENT_AMBULATORY_CARE_PROVIDER_SITE_OTHER): Payer: Medicare Other | Admitting: Family Medicine

## 2016-11-16 ENCOUNTER — Encounter: Payer: Self-pay | Admitting: Family Medicine

## 2016-11-16 VITALS — BP 142/64 | HR 61 | Temp 97.3°F | Ht 62.0 in | Wt 141.2 lb

## 2016-11-16 DIAGNOSIS — I10 Essential (primary) hypertension: Secondary | ICD-10-CM

## 2016-11-16 DIAGNOSIS — N189 Chronic kidney disease, unspecified: Secondary | ICD-10-CM | POA: Diagnosis not present

## 2016-11-16 DIAGNOSIS — M791 Myalgia: Secondary | ICD-10-CM

## 2016-11-16 DIAGNOSIS — Z9181 History of falling: Secondary | ICD-10-CM | POA: Diagnosis not present

## 2016-11-16 DIAGNOSIS — M79604 Pain in right leg: Secondary | ICD-10-CM | POA: Diagnosis not present

## 2016-11-16 DIAGNOSIS — M353 Polymyalgia rheumatica: Secondary | ICD-10-CM | POA: Diagnosis not present

## 2016-11-16 DIAGNOSIS — Z5181 Encounter for therapeutic drug level monitoring: Secondary | ICD-10-CM

## 2016-11-16 DIAGNOSIS — M7918 Myalgia, other site: Secondary | ICD-10-CM | POA: Insufficient documentation

## 2016-11-16 DIAGNOSIS — Z86718 Personal history of other venous thrombosis and embolism: Secondary | ICD-10-CM

## 2016-11-16 DIAGNOSIS — M79605 Pain in left leg: Secondary | ICD-10-CM

## 2016-11-16 LAB — POCT INR: INR: 3.6

## 2016-11-16 NOTE — Assessment & Plan Note (Signed)
Long disc (see a/p for history of falls)  Consider stopping warfarin if we cannot assure safe  Disc with pt and family

## 2016-11-16 NOTE — Patient Instructions (Signed)
Pre visit review using our clinic review tool, if applicable. No additional management support is needed unless otherwise documented below in the visit note. 

## 2016-11-16 NOTE — Assessment & Plan Note (Signed)
Another fall with minimal injury R sided back pain has imp and no head injury Disc safety issues of falls with warfarin  Long disc re: fall prev in and out of home  Use walker at all times and do not engage in activity w/o it  Family aware-will try to more for her so she can stay independent Continues to wear life alert button  Age rel balance change/cerebrovasc dz and hearing loss add to her risk of falls She has done PT for fall prev and outfitted house for safety (family involved)

## 2016-11-16 NOTE — Patient Instructions (Signed)
Try to keep the green vegetables the same every day   With falls- your risk of serious injury is increased because of the coumadin  If you continue to fall- I think we will need to stop it ( discuss this at home) To prevent falls- do not engage in any activity or chore without your walker  Keep your life alert button on   Use a cool compress on painful area of back and hip if you can tolerate it   (alternate with heat) -to not sleep or sit for extended periods of time on heating pad  Muscle rubs are ok  Tylenol is ok   Let me know if symptoms do continue to improve

## 2016-11-16 NOTE — Assessment & Plan Note (Signed)
bp in fair control at this time  BP Readings from Last 1 Encounters:  11/16/16 (!) 142/64   No changes needed Disc lifstyle change with low sodium diet and exercise  Improved from ED with fall

## 2016-11-16 NOTE — Assessment & Plan Note (Signed)
Pt has had 2 DVT with family hx of clotting disorder  She is mt on warfarin successfully  Somewhat worried re: fall hx (more frequent even with as many precautions as possible)  Spoke with pt family re: risks and benefits of anticoag If she continues to fall - I feel she needs to stop warfarin  Pt is resistant to stopping it

## 2016-11-16 NOTE — Assessment & Plan Note (Signed)
Encouraged water intake  Also continue renal f/u

## 2016-11-16 NOTE — Progress Notes (Signed)
Subjective:    Patient ID: Tracy Morales, female    DOB: 1919/09/16, 83 y.o.   MRN: [id number]  HPI Here for f/u after ED visit on 11/07/16 She has prior hx of carotid stenosis/ CV dz and DVT She was seen for an unwitnessed fall that day (she fell from standing) EMS came to the house-bp was high so they recommended ED eval  (she also c/o of R flank pain)   180/68  Pt states she was putting clothes away and turned and her R leg "gave way" - she lost her balance - fell on her butt into a chair (not all the way down) Did not hit head  She did not have a walker with her / was using a cart on wheels for clothes so she did not have a walker  Last fall before that 2 months ago  House has been fall proofed and she uses walker full time usually   One fall at church-the rest at home   She has done PT for fall prev and it was helpful    Dg Ribs Unilateral W/chest Right  Result Date: 11/07/2016 CLINICAL DATA:  unwitnessed fall today. Pt not complaining of any pain. No obvious injures. EMS came to house and recommended that pt be seen at ER due to high blood pressure when it was checked.PT was standing and folding clothes when she fell. Pt unsure if she tripped and fell or not. EXAM: RIGHT RIBS AND CHEST - 3+ VIEW COMPARISON:  CT 06/11/2014 FINDINGS: No fracture or other bone lesions are seen involving the ribs. There is no evidence of pneumothorax or pleural effusion. Both lungs are clear. Heart size and mediastinal contours are within normal limits. Tortuous thoracic aorta. Surgical clips right upper abdomen. Degenerative changes in the right shoulder are incidentally noted. Old L1 compression fracture deformity. IMPRESSION: 1. Negative for displaced  fracture or pneumothorax. Electronically Signed   By: Corlis Leak  Hassell M.D.   On: 11/07/2016 18:25   Dg Lumbar Spine 2-3 Views  Result Date: 11/07/2016 CLINICAL DATA:  83 year old who had an unwitnessed fall earlier today. EXAM: LUMBAR SPINE - 2-3 VIEW  COMPARISON:  Lumbar spine x-rays 06/11/2014. Bone window images from CT chest 06/11/2014 and chest x-ray 11/07/2016 are correlated in order to count ribs. FINDINGS: The prior lumbar spine x-rays numbered only 5 non-rib-bearing lumbar vertebrae. However, there are 6 non-rib-bearing lumbar vertebrae and the prior chest CT and chest x-ray confirmed 12 rib-bearing thoracic vertebrae. Therefore, the lumbar spine is correctly numbered L1 through L6 on the current examination. Anatomic posterior alignment. No acute fractures. Stable remote fracture involving L1 (previously incorrectly labeled T12) on the order of 40-50% or so. Osseous demineralization. Severe disc space narrowing and associated endplate hypertrophic changes at L5-6 and L6-S1. Moderate disc space narrowing at L4-5. Multilevel facet degenerative changes. DISH involving the visualized lower thoracic spine. IMPRESSION: 1. Six non rib-bearing lumbar vertebrae. As there are 12 rib-bearing thoracic vertebrae, these are labeled L1 through L6. 2. No acute osseous abnormalities. 3. Stable remote compression fracture of L1 on the order of 40-50% or so. (Please note that this vertebra was incorrectly labeled T12 on the prior lumbar spine x-rays from 2016). 4. Severe degenerative disc disease at L5-6 and L6-S1. Moderate degenerative disc disease at L4-5. Electronically Signed   By: Hulan Saashomas  Lawrence M.D.   On: 11/07/2016 18:11    Spine film shows severe deg disc dz L5 through S1 Also an old comp fx L1  BP  Readings from Last 3 Encounters:  11/16/16 (!) 142/64  11/07/16 (!) 165/85  07/25/16 122/68   Wt Readings from Last 3 Encounters:  11/16/16 141 lb 4 oz (64.1 kg)  11/07/16 140 lb (63.5 kg)  07/25/16 142 lb (64.4 kg)   Lab Results  Component Value Date   INR 4.0 11/02/2016   INR 4.1 10/12/2016   INR 2.8 09/14/2016   Still hurts on her R hip/low back area  Pain is constant / takes tylenol  occ uses emu cream or ben gay -that helps  ? If bruised    Used ice   83 yo and risk for falls noted Has fallen before  Is on warfarin for several issues incl dvt in the past and carotid stenosis  She has hx of polymyalgia rheumatica in the past Lab Results  Component Value Date   ESRSEDRATE 75 (H) 02/27/2008    Lab Results  Component Value Date   WBC 4.0 04/10/2016   HGB 12.7 04/10/2016   HCT 38.9 04/10/2016   MCV 88.1 04/10/2016   PLT 229.0 04/10/2016     Chemistry      Component Value Date/Time   NA 139 04/10/2016 0927   NA 137 06/06/2014 1507   K 4.3 04/10/2016 0927   K 4.2 06/06/2014 1507   CL 104 04/10/2016 0927   CL 105 06/06/2014 1507   CO2 27 04/10/2016 0927   CO2 22 06/06/2014 1507   BUN 31 (H) 04/10/2016 0927   BUN 24 (H) 06/06/2014 1507   CREATININE 1.66 (H) 04/10/2016 0927   CREATININE 1.46 (H) 06/06/2014 1507      Component Value Date/Time   CALCIUM 9.3 04/10/2016 0927   CALCIUM 9.0 06/06/2014 1507   ALKPHOS 100 04/10/2016 0927   ALKPHOS 120 (H) 06/06/2014 1507   AST 27 04/10/2016 0927   AST 38 (H) 06/06/2014 1507   ALT 13 04/10/2016 0927   ALT 20 06/06/2014 1507   BILITOT 0.5 04/10/2016 0927   BILITOT 0.4 06/06/2014 1507     also renal insuff   Patient Active Problem List   Diagnosis Date Noted  . Right buttock pain 11/16/2016  . History of DVT (deep vein thrombosis) 06/01/2016  . Encounter for therapeutic drug monitoring 06/01/2016  . Right leg pain 04/17/2016  . Osteoarthritis of both hips 04/15/2015  . Hip pain 04/14/2015  . Routine general medical examination at a health care facility 04/12/2015  . Hair loss 11/19/2014  . Loss of weight 06/09/2014  . Arthritis of right shoulder region 06/09/2014  . Nodule of left lung 06/09/2014  . Encounter for Medicare annual wellness exam 10/30/2013  . H/O fall 04/27/2013  . Risk for falls 04/11/2012  . Leg pain, bilateral 01/29/2012  . MICROSCOPIC HEMATURIA 08/29/2009  . CONDUCTIVE HEARING LOSS BILATERAL 08/08/2009  . DVT 04/12/2009  . EDEMA  04/07/2009  . HYPERPOTASSEMIA 01/25/2009  . BRADYCARDIA 10/26/2008  . DYSPNEA 10/26/2008  . INCONTINENCE, URGE 02/27/2008  . Hyperlipidemia 10/11/2006  . PERIPHERAL NEUROPATHY, LOWER EXTREMITIES, BILATERAL 10/11/2006  . Essential hypertension 10/11/2006  . DIASTOLIC DYSFUNCTION 10/11/2006  . CAROTID STENOSIS 10/11/2006  . CEREBROVASCULAR DISEASE 10/11/2006  . PERIPHERAL VASCULAR DISEASE 10/11/2006  . ALLERGIC RHINITIS 10/11/2006  . Chronic kidney disease 10/11/2006  . OVERACTIVE BLADDER 10/11/2006  . OSTEOARTHRITIS 10/11/2006  . POLYMYALGIA RHEUMATICA, HX OF 10/11/2006  . CARDIAC MURMUR, HX OF 10/11/2006   Past Medical History:  Diagnosis Date  . Allergic rhinitis, cause unspecified   . Bradycardia   . Cerebrovascular  disease, unspecified   . Chronic kidney disease, unspecified    chronic renal insufficiency  . Dizziness and giddiness   . DVT (deep venous thrombosis) (HCC)    R  . Dyspnea    echo with normal EF. +diastolic dysfunction  . 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 HTN (hypertension)   . Hyperlipidemia   . Hypertonicity of bladder    hx  . Mild depression (HCC)   . Mononeuritis of lower limb, unspecified    peripheral neuropathy, bilateral  . Occlusion and stenosis of carotid artery without mention of cerebral infarction 4/09   u/s L 40-59%, R 0-39%; stable for many years  . Osteoarthrosis, unspecified whether generalized or localized, unspecified site   . Peripheral vascular disease, unspecified (HCC)   . Polymyalgia rheumatica (HCC)    hx  . RUQ pain   . Urge incontinence    Past Surgical History:  Procedure Laterality Date  . 2D echo  8/06   mild aortic stenosis.   Marland Kitchen arterial doppler    . carotid doppler  4/02   bilateral plaque   . carotid doppler  8/06   non significant stenosis  . carotid doppler     R 40%; L 40-60%  . CATARACT EXTRACTION    . CHOLECYSTECTOMY  2000  . COLONOSCOPY  1/04   polyps  . CT SCAN  05/25/08   abd and  pelvis- inflated urinary bladder and large amt stool throughout w/o blockage   . dexa  2/01   osteopenia  . echo     mild LVH EF 50% As  . triger finger contracture  6/04  . vert. basilar insufficiency  2002  . zoster  5/02   Social History  Substance Use Topics  . Smoking status: Never Smoker  . Smokeless tobacco: Never Used     Comment: non smoker   . Alcohol use No   Family History  Problem Relation Age of Onset  . Hypertension Father   . Other Father        Cardiac problems  . Heart disease Father   . Other Brother        blood clot  . Other Sister        benign breast nodule   Allergies  Allergen Reactions  . Ace Inhibitors     REACTION: cough   Current Outpatient Prescriptions on File Prior to Visit  Medication Sig Dispense Refill  . acetaminophen (TYLENOL) 500 MG tablet Take 500 mg by mouth every 6 (six) hours as needed. OTC-UAD     . amLODipine (NORVASC) 5 MG tablet Take 1 tablet (5 mg total) by mouth daily. 30 tablet 11  . aspirin 81 MG EC tablet Take 81 mg by mouth daily.      . Biotin 1000 MCG tablet Take 1,000 mcg by mouth daily.    . Cholecalciferol (VITAMIN D3) 2000 units capsule Take 2,000 Units by mouth daily.    . ferrous sulfate 325 (65 FE) MG tablet Take 1 tablet (325 mg total) by mouth 2 (two) times daily. 60 tablet 11  . Meclizine HCl (BONINE PO) Take by mouth as needed.    . mirtazapine (REMERON) 30 MG tablet Take 1 tablet (30 mg total) by mouth at bedtime. 30 tablet 11  . NON FORMULARY Walker with wheels - for use with ambulation once daily. 429.9, 715.90, 355.9     . psyllium (METAMUCIL) 58.6 % packet Take 1 packet by mouth daily.    Marland Kitchen  simvastatin (ZOCOR) 20 MG tablet TAKE 1 TABLET (20 MG TOTAL) BY MOUTH DAILY. 30 tablet 11  . warfarin (COUMADIN) 5 MG tablet TAKE AS DIRECTED BY CLINIC 60 tablet 3   No current facility-administered medications on file prior to visit.     Review of Systems    Review of Systems  Constitutional: Negative for  fever, appetite change, fatigue and unexpected weight change.  Eyes: Negative for pain and visual disturbance.  Respiratory: Negative for cough and shortness of breath.   Cardiovascular: Negative for cp or palpitations    Gastrointestinal: Negative for nausea, diarrhea and constipation.  Genitourinary: Negative for urgency and frequency.  Skin: Negative for pallor or rash   MSK pos for pain in R buttock, also chronic low back pain with radiation to legs  Neurological: Negative for speech change, light-headedness, numbness and headaches. pos for poor balance and falls   Pos for intermittent weakness of legs Hematological: Negative for adenopathy. Does not bruise/bleed easily.  Psychiatric/Behavioral: Negative for dysphoric mood. The patient is somewhat nervous/anxious.      Objective:   Physical Exam  Constitutional: She appears well-developed and well-nourished. No distress.  Frail appearing elderly female  HENT:  Head: Normocephalic and atraumatic.  Mouth/Throat: Oropharynx is clear and moist.  Eyes: Conjunctivae and EOM are normal. Pupils are equal, round, and reactive to light. No scleral icterus.  Neck: Normal range of motion. Neck supple. No JVD present. Carotid bruit is not present. No thyromegaly present.  Cardiovascular: Normal rate, regular rhythm, normal heart sounds and intact distal pulses.  Exam reveals no gallop.   Pulmonary/Chest: Effort normal and breath sounds normal. No respiratory distress. She has no wheezes. She has no rales.  No crackles  Abdominal: Soft. Bowel sounds are normal. She exhibits no distension, no abdominal bruit and no mass. There is no tenderness.  Musculoskeletal: She exhibits tenderness. She exhibits no edema.       Right shoulder: She exhibits tenderness. She exhibits no bony tenderness and no crepitus.       Lumbar back: She exhibits decreased range of motion, tenderness and spasm. She exhibits no bony tenderness and no edema.  Mild kyphosis    Some mild /resolving ecchymosis over R flank and buttock Mild tenderness over SI area No LS tenderness rom is limited due to stiffness  Gait slow and steady with walker today  Lymphadenopathy:    She has no cervical adenopathy.  Neurological: She is alert. She has normal strength and normal reflexes. She displays no atrophy. No cranial nerve deficit or sensory deficit. She exhibits normal muscle tone. Gait abnormal. Coordination normal.  Negative bent knee raise (no leg pain) Gait is slow and steady with walker (req asst w/o it)  Skin: Skin is warm and dry. No rash noted. No erythema. No pallor.  Psychiatric: She has a normal mood and affect.          Assessment & Plan:   Problem List Items Addressed This Visit      Cardiovascular and Mediastinum   Essential hypertension - Primary    bp in fair control at this time  BP Readings from Last 1 Encounters:  11/16/16 (!) 142/64   No changes needed Disc lifstyle change with low sodium diet and exercise  Improved from ED with fall         Genitourinary   Chronic kidney disease    Encouraged water intake  Also continue renal f/u         Other  H/O fall    Another fall with minimal injury R sided back pain has imp and no head injury Disc safety issues of falls with warfarin  Long disc re: fall prev in and out of home  Use walker at all times and do not engage in activity w/o it  Family aware-will try to more for her so she can stay independent Continues to wear life alert button  Age rel balance change/cerebrovasc dz and hearing loss add to her risk of falls She has done PT for fall prev and outfitted house for safety (family involved)      History of DVT (deep vein thrombosis)    Pt has had 2 DVT with family hx of clotting disorder  She is mt on warfarin successfully  Somewhat worried re: fall hx (more frequent even with as many precautions as possible)  Spoke with pt family re: risks and benefits of  anticoag If she continues to fall - I feel she needs to stop warfarin  Pt is resistant to stopping it       Leg pain, bilateral    Suspect this may be due to deg disc dz seen on xray        POLYMYALGIA RHEUMATICA, HX OF    Not active lately      Right buttock pain    From recent fall  Some ecchymosis present  Minimal tenderness Disc symptomatic care - see instructions on AVS  Xray is re assuring Reviewed hospital records, lab results and studies in detail        Risk for falls    Long disc (see a/p for history of falls)  Consider stopping warfarin if we cannot assure safe  Disc with pt and family

## 2016-11-16 NOTE — Assessment & Plan Note (Signed)
Suspect this may be due to deg disc dz seen on xray

## 2016-11-16 NOTE — Assessment & Plan Note (Signed)
Not active lately

## 2016-11-16 NOTE — Assessment & Plan Note (Signed)
From recent fall  Some ecchymosis present  Minimal tenderness Disc symptomatic care - see instructions on AVS  Xray is re assuring Reviewed hospital records, lab results and studies in detail

## 2016-12-11 ENCOUNTER — Ambulatory Visit (INDEPENDENT_AMBULATORY_CARE_PROVIDER_SITE_OTHER): Payer: Medicare Other

## 2016-12-11 DIAGNOSIS — Z86718 Personal history of other venous thrombosis and embolism: Secondary | ICD-10-CM | POA: Diagnosis not present

## 2016-12-11 DIAGNOSIS — Z5181 Encounter for therapeutic drug level monitoring: Secondary | ICD-10-CM

## 2016-12-11 LAB — POCT INR: INR: 2.5

## 2016-12-11 NOTE — Patient Instructions (Signed)
Pre visit review using our clinic review tool, if applicable. No additional management support is needed unless otherwise documented below in the visit note. 

## 2017-01-08 ENCOUNTER — Ambulatory Visit (INDEPENDENT_AMBULATORY_CARE_PROVIDER_SITE_OTHER): Payer: Medicare Other

## 2017-01-08 DIAGNOSIS — Z5181 Encounter for therapeutic drug level monitoring: Secondary | ICD-10-CM

## 2017-01-08 DIAGNOSIS — Z86718 Personal history of other venous thrombosis and embolism: Secondary | ICD-10-CM

## 2017-01-08 LAB — POCT INR: INR: 2.5

## 2017-01-08 NOTE — Patient Instructions (Signed)
Pre visit review using our clinic review tool, if applicable. No additional management support is needed unless otherwise documented below in the visit note. 

## 2017-01-21 ENCOUNTER — Ambulatory Visit: Payer: Medicare Other | Admitting: Podiatry

## 2017-02-05 ENCOUNTER — Ambulatory Visit (INDEPENDENT_AMBULATORY_CARE_PROVIDER_SITE_OTHER): Payer: Medicare Other

## 2017-02-05 DIAGNOSIS — Z86718 Personal history of other venous thrombosis and embolism: Secondary | ICD-10-CM

## 2017-02-05 DIAGNOSIS — Z5181 Encounter for therapeutic drug level monitoring: Secondary | ICD-10-CM

## 2017-02-05 LAB — POCT INR: INR: 1.8

## 2017-02-05 NOTE — Patient Instructions (Signed)
Pre visit review using our clinic review tool, if applicable. No additional management support is needed unless otherwise documented below in the visit note. 

## 2017-02-07 ENCOUNTER — Ambulatory Visit (INDEPENDENT_AMBULATORY_CARE_PROVIDER_SITE_OTHER): Payer: Medicare Other | Admitting: Podiatry

## 2017-02-07 ENCOUNTER — Encounter: Payer: Self-pay | Admitting: Podiatry

## 2017-02-07 DIAGNOSIS — E1149 Type 2 diabetes mellitus with other diabetic neurological complication: Secondary | ICD-10-CM | POA: Diagnosis not present

## 2017-02-07 DIAGNOSIS — B351 Tinea unguium: Secondary | ICD-10-CM

## 2017-02-07 DIAGNOSIS — M79674 Pain in right toe(s): Secondary | ICD-10-CM | POA: Diagnosis not present

## 2017-02-07 DIAGNOSIS — M79675 Pain in left toe(s): Secondary | ICD-10-CM

## 2017-02-07 DIAGNOSIS — Q828 Other specified congenital malformations of skin: Secondary | ICD-10-CM

## 2017-02-07 NOTE — Progress Notes (Signed)
Complaint:  Visit Type: Patient returns to my office for continued preventative foot care services. Complaint: Patient states" my nails have grown long and thick and become painful to walk and wear shoes" Patient has been diagnosed with DM with no foot complications. The patient presents for preventative foot care services. No changes to ROS  Podiatric Exam: Vascular: dorsalis pedis and posterior tibial pulses are palpable bilateral. Capillary return is immediate. Temperature gradient is WNL. Skin turgor WNL  Sensorium: Diminished  Semmes Weinstein monofilament test. Normal tactile sensation bilaterally. Nail Exam: Pt has thick disfigured discolored nails with subungual debris noted bilateral entire nail hallux through fifth toenails Ulcer Exam: There is no evidence of ulcer or pre-ulcerative changes or infection. Orthopedic Exam: Muscle tone and strength are WNL. No limitations in general ROM. No crepitus or effusions noted. Foot type and digits show no abnormalities.  Severe HAV  B/L with hammer toes.   Skin:  Porokeratosis sub 1 right foot. No infection or ulcers  Diagnosis:  Onychomycosis, , Pain in right toe, pain in left toes  Treatment & Plan Procedures and Treatment: Consent by patient was obtained for treatment procedures. The patient understood the discussion of treatment and procedures well. All questions were answered thoroughly reviewed. Debridement of mycotic and hypertrophic toenails, 1 through 5 bilateral and clearing of subungual debris. No ulceration, no infection noted.  Return Visit-Office Procedure: Patient instructed to return to the office for a follow up visit 3 months for continued evaluation and treatment.    Helane GuntherGregory Jareb Radoncic DPM

## 2017-02-11 ENCOUNTER — Other Ambulatory Visit: Payer: Self-pay | Admitting: Family Medicine

## 2017-02-11 NOTE — Telephone Encounter (Signed)
Routing to Coumadin Clinic Nurse

## 2017-02-11 NOTE — Telephone Encounter (Signed)
Patient is compliant with coumadin management.  Will refill X 6 months.   

## 2017-02-19 ENCOUNTER — Ambulatory Visit (INDEPENDENT_AMBULATORY_CARE_PROVIDER_SITE_OTHER): Payer: Medicare Other

## 2017-02-19 DIAGNOSIS — Z23 Encounter for immunization: Secondary | ICD-10-CM | POA: Diagnosis not present

## 2017-02-19 DIAGNOSIS — Z86718 Personal history of other venous thrombosis and embolism: Secondary | ICD-10-CM

## 2017-02-19 DIAGNOSIS — Z5181 Encounter for therapeutic drug level monitoring: Secondary | ICD-10-CM

## 2017-02-19 DIAGNOSIS — Z7901 Long term (current) use of anticoagulants: Secondary | ICD-10-CM

## 2017-02-19 LAB — POCT INR: INR: 2.7

## 2017-02-19 NOTE — Patient Instructions (Signed)
Pre visit review using our clinic review tool, if applicable. No additional management support is needed unless otherwise documented below in the visit note. 

## 2017-02-27 ENCOUNTER — Other Ambulatory Visit: Payer: Self-pay | Admitting: Family Medicine

## 2017-03-19 ENCOUNTER — Telehealth: Payer: Self-pay

## 2017-03-19 ENCOUNTER — Ambulatory Visit (INDEPENDENT_AMBULATORY_CARE_PROVIDER_SITE_OTHER): Payer: Medicare Other

## 2017-03-19 DIAGNOSIS — Z5181 Encounter for therapeutic drug level monitoring: Secondary | ICD-10-CM

## 2017-03-19 DIAGNOSIS — Z86718 Personal history of other venous thrombosis and embolism: Secondary | ICD-10-CM

## 2017-03-19 LAB — POCT INR: INR: 3

## 2017-03-19 NOTE — Patient Instructions (Signed)
Pre visit review using our clinic review tool, if applicable. No additional management support is needed unless otherwise documented below in the visit note. 

## 2017-03-19 NOTE — Telephone Encounter (Signed)
Patient and daughter, Maren Reamer, informed me that patient has had 2 falls in the past week and legs are definitely weaker.   In discussion further of this, I recommended in home PT/OT as well as nursing/aide assistance due to physical decline, frequent falls, need for PT/INR monitoring and assistance with showers/dressing.  I am hopeful that she will qualify for the in home nursing services as long as therapy is in place.  We can discuss this further with the Home Health company.    Daughter and patient were both on board with this and would like for referral to be made.  Please advise if ok to proceed in contacting home health agency.    Thanks.

## 2017-03-19 NOTE — Telephone Encounter (Signed)
Yes- that is fine please contact home health  Thanks

## 2017-03-27 NOTE — Telephone Encounter (Signed)
Spoke with Maren ReamerMinerva (patient's daughter) and explained we are working on referral.  I did apologize for the delay and will continue working on referral tomorrow.

## 2017-03-28 NOTE — Telephone Encounter (Signed)
Sp[facility] with [facility]ica at [facility] and she is forwarding the referral form to me to work on.  We will need to fax with Last OV note and demographics.  Fax number to send to is :  [phone]

## 2017-04-04 NOTE — Telephone Encounter (Signed)
I have not received referral fax form.  Also, spoke with patient's daughter, Maren ReamerMinerva.  Will discuss with home health and Dr. Milinda Antisower on Monday regarding need to have patient seen prior to setting up home health.  Patient will need an OV pertaining to need for home health in order to have insurance cover services.    May be able to work patient in on Tuesday 04/09/17.  Will discuss with all parties on Monday and call patient back.

## 2017-04-08 ENCOUNTER — Ambulatory Visit: Payer: Self-pay | Admitting: *Deleted

## 2017-04-08 NOTE — Telephone Encounter (Signed)
Pt  Denies    Any  Chest  Pain  Or  Shortness  Of  Breath    Denies  Any  Injury    Daughter  With  Patient        Reason for Disposition . [1] MODERATE pain (e.g., interferes with normal activities) AND [2] present > 3 days  Answer Assessment - Initial Assessment Questions 1. ONSET: "When did the pain start?"     Last  night 2. LOCATION: "Where is the pain located?"      l  Shoulder    3. PAIN: "How bad is the pain?" (Scale 1-10; or mild, moderate, severe)   - MILD (1-3): doesn't interfere with normal activities   - MODERATE (4-7): interferes with normal activities (e.g., work or school) or awakens from sleep   - SEVERE (8-10): excruciating pain, unable to do any normal activities, unable to move arm at all due to pain     Moderate 4. WORK OR EXERCISE: "Has there been any recent work or exercise that involved this part of the body?"     No  Known  injury 5. CAUSE: "What do you think is causing the shoulder pain?"      Arthritis  In other  Shoulder  6. OTHER SYMPTOMS: "Do you have any other symptoms?" (e.g., neck pain, swelling, rash, fever, numbness, weakness)       No  Other  symptoms 7. PREGNANCY: "Is there any chance you are pregnant?" "When was your last menstrual period?"      no  Protocols used: SHOULDER PAIN-A-AH

## 2017-04-08 NOTE — Telephone Encounter (Signed)
Spoke with Merchandiser, retailcarlette, [facility].  She is re-faxing the referral form and verifies that as long as patient is seen within 30 days of initiation of services, an additional appointment is not needed.  Patient has an appointment with Dr. Milinda Antisower on 04/29/17.  Dr. Milinda Antisower will just need to document fall history, general decline and need for services in her 04/29/17 ov note.  (I have copied her on this info and will add that to the appt. Notes).    After completing referral form with attached notes, insurance info and demographics, Scarlette will contact daughter Tracy Morales to set up in home eval for services.  Spoke with daughter, Tracy Morales, to provide an update and request she call me if she hasn't heard from home health by the end of this week.

## 2017-04-10 ENCOUNTER — Encounter: Payer: Self-pay | Admitting: Family Medicine

## 2017-04-10 ENCOUNTER — Ambulatory Visit: Payer: Medicare Other | Admitting: Family Medicine

## 2017-04-10 ENCOUNTER — Telehealth: Payer: Self-pay

## 2017-04-10 VITALS — BP 174/77 | HR 72 | Temp 97.6°F | Wt 131.0 lb

## 2017-04-10 DIAGNOSIS — S46912A Strain of unspecified muscle, fascia and tendon at shoulder and upper arm level, left arm, initial encounter: Secondary | ICD-10-CM | POA: Diagnosis not present

## 2017-04-10 NOTE — Progress Notes (Signed)
Subjective:    Patient ID: Tracy Morales, female    DOB: Feb 13, 1920, 83 y.o.   MRN: [id number]  HPI This is a 83 yo female who presents today with left shoulder pain x 4 days. She is accompanied by her daughter. She has not had any recent falls, trauma or over use. Has been taking occasional acetaminophen with good results. Had radiating pain several days ago but none recently. No numbness or tingling, slept well last night. Pain improving daily. Had a bursitis requiring steroid injection in right shoulder, this is not as painful.   Past Medical History:  Diagnosis Date  . Allergic rhinitis, cause unspecified   . Bradycardia   . Cerebrovascular disease, unspecified   . Chronic kidney disease, unspecified    chronic renal insufficiency  . Dizziness and giddiness   . DVT (deep venous thrombosis) (HCC)    R  . Dyspnea    echo with normal EF. +diastolic dysfunction  . 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 HTN (hypertension)   . Hyperlipidemia   . Hypertonicity of bladder    hx  . Mild depression (HCC)   . Mononeuritis of lower limb, unspecified    peripheral neuropathy, bilateral  . Occlusion and stenosis of carotid artery without mention of cerebral infarction 4/09   u/s L 40-59%, R 0-39%; stable for many years  . Osteoarthrosis, unspecified whether generalized or localized, unspecified site   . Peripheral vascular disease, unspecified (HCC)   . Polymyalgia rheumatica (HCC)    hx  . RUQ pain   . Urge incontinence    Past Surgical History:  Procedure Laterality Date  . 2D echo  8/06   mild aortic stenosis.   Marland Kitchen. arterial doppler    . carotid doppler  4/02   bilateral plaque   . carotid doppler  8/06   non significant stenosis  . carotid doppler     R 40%; L 40-60%  . CATARACT EXTRACTION    . CHOLECYSTECTOMY  2000  . COLONOSCOPY  1/04   polyps  . CT SCAN  05/25/08   abd and pelvis- inflated urinary bladder and large amt stool throughout w/o blockage   .  dexa  2/01   osteopenia  . echo     mild LVH EF 50% As  . triger finger contracture  6/04  . vert. basilar insufficiency  2002  . zoster  5/02   Family History  Problem Relation Age of Onset  . Hypertension Father   . Other Father        Cardiac problems  . Heart disease Father   . Other Brother        blood clot  . Other Sister        benign breast nodule   Social History   Tobacco Use  . Smoking status: Never Smoker  . Smokeless tobacco: Never Used  . Tobacco comment: non smoker   Substance Use Topics  . Alcohol use: No    Alcohol/week: 0.0 oz  . Drug use: No      Review of Systems Per HPI     Objective:   Physical Exam  Constitutional: She appears well-developed and well-nourished. No distress.  HENT:  Head: Normocephalic and atraumatic.  Eyes: Conjunctivae are normal.  Cardiovascular: Normal rate.  Pulmonary/Chest: Effort normal.  Musculoskeletal:       Left shoulder: She exhibits decreased range of motion (decreased abduction). She exhibits no tenderness and no bony tenderness.  Neurological: She is alert.  Skin: Skin is warm and dry. She is not diaphoretic.  Psychiatric: She has a normal mood and affect. Her behavior is normal.  Vitals reviewed.     BP (!) 174/77 (BP Location: Left Arm, Patient Position: Sitting, Cuff Size: Normal)   Pulse 72   Temp 97.6 F (36.4 C) (Oral)   Wt 131 lb (59.4 kg)   SpO2 96%   BMI 23.96 kg/m  Wt Readings from Last 3 Encounters:  04/10/17 131 lb (59.4 kg)  11/16/16 141 lb 4 oz (64.1 kg)  11/07/16 140 lb (63.5 kg)   BP Readings from Last 3 Encounters:  04/10/17 (!) 174/77  11/16/16 (!) 142/64  11/07/16 (!) 165/85       Assessment & Plan:  1. Left shoulder strain, initial encounter - symptoms improving, will continue with conservative treatment-acetaminophen, heat, gentle range of motion - RTC precautions reviewed   Olean Reeeborah Gessner, FNP-BC  [facility]  04/11/2017 8:37 PM

## 2017-04-10 NOTE — Telephone Encounter (Signed)
Copied from CRM [id number]. Topic: Inquiry >> Apr 10, 2017 10:24 AM Yvonna Alanisobinson, Andra M wrote: Reason for CRM: [facility] called to notify Dr. Sherie DonLada that the referral has been received. [facility] will be going out to see the patient on tomorrow, 04/11/2017.     Thank You!!!

## 2017-04-10 NOTE — Patient Instructions (Signed)
Continue tylenol twice a day, heat as needed  Do gentle exercises twice a day  If not better next week, please schedule with Dr. Patsy Lageropland

## 2017-04-11 ENCOUNTER — Encounter: Payer: Self-pay | Admitting: Family Medicine

## 2017-04-12 ENCOUNTER — Telehealth: Payer: Self-pay | Admitting: Family Medicine

## 2017-04-12 NOTE — Telephone Encounter (Signed)
Copied from CRM #[id number]. Topic: Inquiry °>> Apr 12, 2017 12:28 PM White, Selina wrote: °Reason for CRM: Kecia for [facility] call to notify Dr. Tower that they will be going out tomorrow, April 13, 2017 to see the patient for home health. ° °

## 2017-04-12 NOTE — Telephone Encounter (Signed)
Copied from CRM [id number]. Topic: Inquiry >> Apr 12, 2017 12:28 PM Viviann SpareWhite, Selina wrote: Reason for CRM: Matthias HughsKecia for [facility] call to notify Dr. Milinda Antisower that they will be going out tomorrow, April 13, 2017 to see the patient for home health.

## 2017-04-15 ENCOUNTER — Telehealth: Payer: Self-pay | Admitting: Family Medicine

## 2017-04-15 NOTE — Telephone Encounter (Signed)
I don't see the message they are speaking of ?

## 2017-04-15 NOTE — Telephone Encounter (Signed)
Copied from CRM [phone]. Topic: Quick Communication - See Telephone Encounter >> Apr 15, 2017 11:48 AM Clack, Princella PellegriniJessica D wrote: CRM for notification. See Telephone encounter for: Caron Presumeamela Brady with [facility] requesting order for skilled nursing. Contact number [phone]  04/15/17.

## 2017-04-16 NOTE — Telephone Encounter (Signed)
Tracy Morales is just requesting verbal order for skilled nursing for this pt [phone]

## 2017-04-16 NOTE — Telephone Encounter (Signed)
Please ok that verbal order  

## 2017-04-16 NOTE — Telephone Encounter (Signed)
Verbal order given to nurse on call (pam not available)

## 2017-04-22 ENCOUNTER — Ambulatory Visit: Payer: Medicare HMO

## 2017-04-22 ENCOUNTER — Ambulatory Visit: Payer: Medicare Other

## 2017-04-22 DIAGNOSIS — Z86718 Personal history of other venous thrombosis and embolism: Secondary | ICD-10-CM

## 2017-04-22 DIAGNOSIS — Z5181 Encounter for therapeutic drug level monitoring: Secondary | ICD-10-CM

## 2017-04-22 LAB — POCT INR: INR: 3.4

## 2017-04-22 NOTE — Patient Instructions (Signed)
INR today 3.4   Continue taking 1/2 pill (2.5mg ) Daily EXCEPT for 1 pill on Sundays and Thursdays.  Recheck in 4 weeks.    Daughter present and given copy of today's instructions.

## 2017-04-23 ENCOUNTER — Other Ambulatory Visit: Payer: Self-pay | Admitting: Family Medicine

## 2017-04-23 NOTE — Telephone Encounter (Signed)
Aware, thanks!

## 2017-04-23 NOTE — Telephone Encounter (Signed)
Copied from CRM [phone]. Topic: Inquiry >> Apr 23, 2017 10:38 AM Arlyss Gandyichardson, Taren N, NT wrote: Reason for CRM: [facility] let Dr. Milinda Antisower she missed a visit because of a scheduling error the first time, and the second time the daughter was taking the patient out. Asher MuirJamie will be seeing the pt today. CB#: [phone]

## 2017-04-28 ENCOUNTER — Telehealth: Payer: Self-pay | Admitting: Family Medicine

## 2017-04-28 DIAGNOSIS — Z0000 Encounter for general adult medical examination without abnormal findings: Secondary | ICD-10-CM

## 2017-04-28 DIAGNOSIS — I10 Essential (primary) hypertension: Secondary | ICD-10-CM

## 2017-04-28 DIAGNOSIS — E875 Hyperkalemia: Secondary | ICD-10-CM

## 2017-04-28 DIAGNOSIS — E7800 Pure hypercholesterolemia, unspecified: Secondary | ICD-10-CM

## 2017-04-28 DIAGNOSIS — N189 Chronic kidney disease, unspecified: Secondary | ICD-10-CM

## 2017-04-28 NOTE — Telephone Encounter (Signed)
-----   Message from Robert Bellowarlesia R Pinson, LPN sent at 16/10/960411/21/2018  5:06 PM EST ----- Regarding: Labs 11/29 Lab orders needed. Thank you.  Insurance:  [employer]

## 2017-04-29 ENCOUNTER — Encounter: Payer: Medicare Other | Admitting: Family Medicine

## 2017-05-02 ENCOUNTER — Ambulatory Visit (INDEPENDENT_AMBULATORY_CARE_PROVIDER_SITE_OTHER): Payer: Medicare Other

## 2017-05-02 VITALS — BP 142/70 | HR 58 | Temp 97.5°F | Ht 62.0 in | Wt 110.0 lb

## 2017-05-02 DIAGNOSIS — I10 Essential (primary) hypertension: Secondary | ICD-10-CM | POA: Diagnosis not present

## 2017-05-02 DIAGNOSIS — E7800 Pure hypercholesterolemia, unspecified: Secondary | ICD-10-CM

## 2017-05-02 DIAGNOSIS — Z0000 Encounter for general adult medical examination without abnormal findings: Secondary | ICD-10-CM | POA: Diagnosis not present

## 2017-05-02 LAB — LIPID PANEL
Cholesterol: 157 mg/dL (ref 0–200)
HDL: 35.5 mg/dL — AB (ref >39.00)
LDL Cholesterol: 109 mg/dL — ABNORMAL HIGH (ref 0–99)
NonHDL: 121.4
TRIGLYCERIDES: 62 mg/dL (ref 0.0–149.0)
Total CHOL/HDL Ratio: 4
VLDL: 12.4 mg/dL (ref 0.0–40.0)

## 2017-05-02 LAB — COMPREHENSIVE METABOLIC PANEL
ALK PHOS: 90 U/L (ref 39–117)
ALT: 14 U/L (ref 0–35)
AST: 28 U/L (ref 0–37)
Albumin: 3.7 g/dL (ref 3.5–5.2)
BUN: 35 mg/dL — ABNORMAL HIGH (ref 6–23)
CALCIUM: 9.4 mg/dL (ref 8.4–10.5)
CO2: 27 mEq/L (ref 19–32)
Chloride: 104 mEq/L (ref 96–112)
Creatinine, Ser: 1.56 mg/dL — ABNORMAL HIGH (ref 0.40–1.20)
GFR: 39.61 mL/min — AB (ref >60.00)
Glucose, Bld: 96 mg/dL (ref 70–99)
Potassium: 4.1 mEq/L (ref 3.5–5.1)
Sodium: 138 mEq/L (ref 135–145)
TOTAL PROTEIN: 7.4 g/dL (ref 6.0–8.3)
Total Bilirubin: 0.5 mg/dL (ref 0.2–1.2)

## 2017-05-02 LAB — CBC WITH DIFFERENTIAL/PLATELET
BASOS ABS: 0 10*3/uL (ref 0.0–0.1)
Basophils Relative: 0.7 % (ref 0.0–3.0)
Eosinophils Absolute: 0.1 10*3/uL (ref 0.0–0.7)
Eosinophils Relative: 2.2 % (ref 0.0–5.0)
HEMATOCRIT: 38.6 % (ref 36.0–46.0)
Hemoglobin: 12.7 g/dL (ref 12.0–15.0)
LYMPHS PCT: 31.2 % (ref 12.0–46.0)
Lymphs Abs: 1.5 10*3/uL (ref 0.7–4.0)
MCHC: 33 g/dL (ref 30.0–36.0)
MCV: 89.3 fl (ref 78.0–100.0)
MONOS PCT: 13 % — AB (ref 3.0–12.0)
Monocytes Absolute: 0.6 10*3/uL (ref 0.1–1.0)
NEUTROS PCT: 52.9 % (ref 43.0–77.0)
Neutro Abs: 2.6 10*3/uL (ref 1.4–7.7)
Platelets: 251 10*3/uL (ref 150.0–400.0)
RBC: 4.32 Mil/uL (ref 3.87–5.11)
RDW: 14.9 % (ref 11.5–15.5)
WBC: 4.9 10*3/uL (ref 4.0–10.5)

## 2017-05-02 LAB — TSH: TSH: 2.8 u[IU]/mL (ref 0.35–4.50)

## 2017-05-02 NOTE — Progress Notes (Signed)
PCP notes:   Health maintenance:  No gaps identified.  Abnormal screenings:   Mini-Cog score: 18/20  Depression score: 15   Patient concerns:   None  Nurse concerns:  None  Next PCP appt:   05/06/17 @ 1615

## 2017-05-02 NOTE — Patient Instructions (Signed)
[name] , Thank you for taking time to come for your Medicare Wellness Visit. I appreciate your ongoing commitment to your health goals. Please review the following plan we discussed and let me know if I can assist you in the future.   These are the goals we discussed: Goals    . Follow up with Primary Care Provider     Starting 05/02/2017, I will continue to take medications as prescribed and to follow up with primary care provider as directed.        This is a list of the screening recommended for you and due dates:  Health Maintenance  Topic Date Due  . Complete foot exam   07/06/2020*  . Hemoglobin A1C  07/06/2020*  . Urine Protein Check  07/06/2020*  . Eye exam for diabetics  11/02/2017  . Tetanus Vaccine  10/10/2021  . Flu Shot  Completed  . DEXA scan (bone density measurement)  Completed  . Pneumonia vaccines  Completed  *Topic was postponed. The date shown is not the original due date.   Preventive Care for Adults  A healthy lifestyle and preventive care can promote health and wellness. Preventive health guidelines for adults include the following key practices.  . A routine yearly physical is a good way to check with your health care provider about your health and preventive screening. It is a chance to share any concerns and updates on your health and to receive a thorough exam.  . Visit your dentist for a routine exam and preventive care every 6 months. Brush your teeth twice a day and floss once a day. Good oral hygiene prevents tooth decay and gum disease.  . The frequency of eye exams is based on your age, health, family medical history, use  of contact lenses, and other factors. Follow your health care provider's recommendations for frequency of eye exams.  . Eat a healthy diet. Foods like vegetables, fruits, whole grains, low-fat dairy products, and lean protein foods contain the nutrients you need without too many calories. Decrease your intake of foods high  in solid fats, added sugars, and salt. Eat the right amount of calories for you. Get information about a proper diet from your health care provider, if necessary.  . Regular physical exercise is one of the most important things you can do for your health. Most adults should get at least 150 minutes of moderate-intensity exercise (any activity that increases your heart rate and causes you to sweat) each week. In addition, most adults need muscle-strengthening exercises on 2 or more days a week.  Silver Sneakers may be a benefit available to you. To determine eligibility, you may visit the website: www.silversneakers.com or contact program at [phone])[phone] Mon-Fri between 8AM-8PM.   . Maintain a healthy weight. The body mass index (BMI) is a screening tool to identify possible weight problems. It provides an estimate of body fat based on height and weight. Your health care provider can find your BMI and can help you achieve or maintain a healthy weight.   For adults 20 years and older: ? A BMI below 18.5 is considered underweight. ? A BMI of 18.5 to 24.9 is normal. ? A BMI of 25 to 29.9 is considered overweight. ? A BMI of 30 and above is considered obese.   . Maintain normal blood lipids and cholesterol levels by exercising and minimizing your intake of saturated fat. Eat a balanced diet with plenty of fruit and vegetables. Blood tests for lipids and cholesterol  should begin at age 30 and be repeated every 5 years. If your lipid or cholesterol levels are high, you are over 50, or you are at high risk for heart disease, you may need your cholesterol levels checked more frequently. Ongoing high lipid and cholesterol levels should be treated with medicines if diet and exercise are not working.  . If you smoke, find out from your health care provider how to quit. If you do not use tobacco, please do not start.  . If you choose to drink alcohol, please do not consume more than 2 drinks per day. One  drink is considered to be 12 ounces (355 mL) of beer, 5 ounces (148 mL) of wine, or 1.5 ounces (44 mL) of liquor.  . If you are 37-31 years old, ask your health care provider if you should take aspirin to prevent strokes.  . Use sunscreen. Apply sunscreen liberally and repeatedly throughout the day. You should seek shade when your shadow is shorter than you. Protect yourself by wearing long sleeves, pants, a wide-brimmed hat, and sunglasses year round, whenever you are outdoors.  . Once a month, do a whole body skin exam, using a mirror to look at the skin on your back. Tell your health care provider of new moles, moles that have irregular borders, moles that are larger than a pencil eraser, or moles that have changed in shape or color.

## 2017-05-02 NOTE — Progress Notes (Signed)
Pre visit review using our clinic review tool, if applicable. No additional management support is needed unless otherwise documented below in the visit note. 

## 2017-05-02 NOTE — Progress Notes (Signed)
Subjective:   Tracy Morales is a 83 y.o. female who presents for Medicare Annual (Subsequent) preventive examination.  Review of Systems:  N/A Cardiac Risk Factors include: advanced age (>6855men, 80>65 women);dyslipidemia;hypertension     Objective:     Vitals: BP (!) 142/70 (BP Location: Right Arm, Patient Position: Sitting, Cuff Size: Normal)   Pulse (!) 58   Temp (!) 97.5 F (36.4 C) (Oral)   Ht 5\' 2"  (1.575 m) Comment: shoes  Wt 110 lb (49.9 kg)   SpO2 98%   BMI 20.12 kg/m   Body mass index is 20.12 kg/m.   Tobacco Social History   Tobacco Use  Smoking Status Never Smoker  Smokeless Tobacco Never Used  Tobacco Comment   non smoker      Counseling given: No Comment: non smoker    Past Medical History:  Diagnosis Date  . Allergic rhinitis, cause unspecified   . Bradycardia   . Cerebrovascular disease, unspecified   . Chronic kidney disease, unspecified    chronic renal insufficiency  . Dizziness and giddiness   . DVT (deep venous thrombosis) (HCC)    R  . Dyspnea    echo with normal EF. +diastolic dysfunction  . 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 HTN (hypertension)   . Hyperlipidemia   . Hypertonicity of bladder    hx  . Mild depression (HCC)   . Mononeuritis of lower limb, unspecified    peripheral neuropathy, bilateral  . Occlusion and stenosis of carotid artery without mention of cerebral infarction 4/09   u/s L 40-59%, R 0-39%; stable for many years  . Osteoarthrosis, unspecified whether generalized or localized, unspecified site   . Peripheral vascular disease, unspecified (HCC)   . Polymyalgia rheumatica (HCC)    hx  . RUQ pain   . Urge incontinence    Past Surgical History:  Procedure Laterality Date  . 2D echo  8/06   mild aortic stenosis.   Marland Kitchen. arterial doppler    . carotid doppler  4/02   bilateral plaque   . carotid doppler  8/06   non significant stenosis  . carotid doppler     R 40%; L 40-60%  . CATARACT  EXTRACTION    . CHOLECYSTECTOMY  2000  . COLONOSCOPY  1/04   polyps  . CT SCAN  05/25/08   abd and pelvis- inflated urinary bladder and large amt stool throughout w/o blockage   . dexa  2/01   osteopenia  . echo     mild LVH EF 50% As  . triger finger contracture  6/04  . vert. basilar insufficiency  2002  . zoster  5/02   Family History  Problem Relation Age of Onset  . Hypertension Father   . Other Father        Cardiac problems  . Heart disease Father   . Other Brother        blood clot  . Other Sister        benign breast nodule   Social History   Substance and Sexual Activity  Sexual Activity No    Outpatient Encounter Medications as of 05/02/2017  Medication Sig  . acetaminophen (TYLENOL) 500 MG tablet Take 500 mg by mouth every 6 (six) hours as needed. OTC-UAD   . amLODipine (NORVASC) 5 MG tablet TAKE 1 TABLET (5 MG TOTAL) BY MOUTH DAILY.  Marland Kitchen. aspirin 81 MG EC tablet Take 81 mg by mouth daily.    .Marland Kitchen  Biotin 1000 MCG tablet Take 1,000 mcg by mouth daily.  . Cholecalciferol (VITAMIN D3) 2000 units capsule Take 2,000 Units by mouth daily.  . ferrous sulfate 325 (65 FE) MG tablet Take 1 tablet (325 mg total) by mouth 2 (two) times daily.  . Meclizine HCl (BONINE PO) Take by mouth as needed.  . mirtazapine (REMERON) 30 MG tablet Take 1 tablet (30 mg total) by mouth at bedtime.  . NON FORMULARY Walker with wheels - for use with ambulation once daily. 429.9, 715.90, 355.9   . psyllium (METAMUCIL) 58.6 % packet Take 1 packet by mouth daily.  . simvastatin (ZOCOR) 20 MG tablet TAKE 1 TABLET (20 MG TOTAL) BY MOUTH DAILY.  Marland Kitchen warfarin (COUMADIN) 5 MG tablet TAKE AS DIRECTED BY CLINIC   No facility-administered encounter medications on file as of 05/02/2017.     Activities of Daily Living In your present state of health, do you have any difficulty performing the following activities: 05/02/2017  Hearing? Y  Vision? N  Difficulty concentrating or making decisions? Y    Walking or climbing stairs? Y  Dressing or bathing? N  Doing errands, shopping? Y  Preparing Food and eating ? Y  Using the Toilet? N  In the past six months, have you accidently leaked urine? N  Do you have problems with loss of bowel control? N  Managing your Medications? Y  Managing your Finances? Y  Housekeeping or managing your Housekeeping? Y  Some recent data might be hidden    Patient Care Team: Tower, Audrie Gallus, MD as PCP - General Blair Promise, [place] as Consulting Physician (Optometry) Mosetta Pigeon, MD as Consulting Physician (Internal Medicine) Orson Ape, MD as Consulting Physician (Urology) Elinor Parkinson, [place] as Consulting Physician (Podiatry) Fransico Michael, MD as Referring Physician (Audiology)    Assessment:    Hearing Screening Comments: Hearing aids Vision Screening Comments: Last vision exam in 10/2015 with Dr. Druscilla Brownie B.   Exercise Activities and Dietary recommendations Current Exercise Habits: Home exercise routine, Type of exercise: Other - see comments(physical therapy), Time (Minutes): 60, Frequency (Times/Week): 2, Weekly Exercise (Minutes/Week): 120, Intensity: Mild, Exercise limited by: orthopedic condition(s)  Goals    . Follow up with Primary Care Provider     Starting 05/02/2017, I will continue to take medications as prescribed and to follow up with primary care provider as directed.       Fall Risk Fall Risk  05/02/2017 04/19/2016 04/17/2016 04/12/2015 11/19/2014  Falls in the past year? No Yes Yes Yes No  Comment - per pt, "legs gave way and I fell backwards" - - -  Number falls in past yr: - 1 1 1  -  Injury with Fall? - Yes No No -  Risk Factor Category  - - - - -  Risk for fall due to : - - - - -  Follow up - Falls evaluation completed - - -   Depression Screen PHQ 2/9 Scores 05/02/2017 04/19/2016 04/17/2016 04/12/2015  PHQ - 2 Score 3 0 0 0  PHQ- 9 Score 15 - - -     Cognitive Function MMSE - Mini Mental State Exam  05/02/2017 04/19/2016  Orientation to time 5 5  Orientation to Place 5 5  Registration 3 3  Attention/ Calculation 0 0  Recall 1 3  Recall-comments unable  to recall 2 of 3 words -  Language- name 2 objects 0 0  Language- repeat 1 1  Language- follow 3 step command 3  3  Language- read & follow direction 0 0  Write a sentence 0 0  Copy design 0 0  Total score 18 20     PLEASE NOTE: A Mini-Cog screen was completed. Maximum score is 20. A value of 0 denotes this part of Folstein MMSE was not completed or the patient failed this part of the Mini-Cog screening.   Mini-Cog Screening Orientation to Time - Max 5 pts Orientation to Place - Max 5 pts Registration - Max 3 pts Recall - Max 3 pts Language Repeat - Max 1 pts Language Follow 3 Step Command - Max 3 pts     Immunization History  Administered Date(s) Administered  . Influenza Split 03/08/2011, 02/25/2012  . Influenza Whole 05/05/2004, 02/27/2008, 03/03/2009, 02/24/2010  . Influenza,inj,Quad PF,6+ Mos 02/25/2013, 02/01/2014, 03/10/2015, 03/05/2016, 02/19/2017  . PPD Test 06/09/2014  . Pneumococcal Conjugate-13 10/30/2013  . Pneumococcal Polysaccharide-23 05/06/2002  . Td 05/06/2002  . Tdap 10/11/2011   Screening Tests Health Maintenance  Topic Date Due  . FOOT EXAM  07/06/2020 (Originally 09/19/1929)  . HEMOGLOBIN A1C  07/06/2020 (Originally 07/18/1919)  . URINE MICROALBUMIN  07/06/2020 (Originally 09/19/1929)  . OPHTHALMOLOGY EXAM  11/02/2017  . TETANUS/TDAP  10/10/2021  . INFLUENZA VACCINE  Completed  . DEXA SCAN  Completed  . PNA vac Low Risk Adult  Completed      Plan:   I have personally reviewed, addressed, and noted the following in the patient's chart:  A. Medical and social history B. Use of alcohol, tobacco or illicit drugs  C. Current medications and supplements D. Functional ability and status E.  Nutritional status F.  Physical activity G. Advance directives H. List of other physicians I.    Hospitalizations, surgeries, and ER visits in previous 12 months J.  Vitals K. Screenings to include hearing, vision, cognitive, depression L. Referrals and appointments - none  In addition, I have reviewed and discussed with patient certain preventive protocols, quality metrics, and best practice recommendations. A written personalized care plan for preventive services as well as general preventive health recommendations were provided to patient.  See attached scanned questionnaire for additional information.   Signed,   Randa EvensLesia Pinson, MHA, BS, LPN Health Coach

## 2017-05-06 ENCOUNTER — Telehealth: Payer: Self-pay | Admitting: *Deleted

## 2017-05-06 ENCOUNTER — Ambulatory Visit (INDEPENDENT_AMBULATORY_CARE_PROVIDER_SITE_OTHER): Payer: Medicare Other | Admitting: Family Medicine

## 2017-05-06 ENCOUNTER — Encounter: Payer: Self-pay | Admitting: Family Medicine

## 2017-05-06 VITALS — BP 136/68 | HR 77 | Temp 97.6°F | Ht 62.0 in | Wt 135.5 lb

## 2017-05-06 DIAGNOSIS — Z86718 Personal history of other venous thrombosis and embolism: Secondary | ICD-10-CM

## 2017-05-06 DIAGNOSIS — N189 Chronic kidney disease, unspecified: Secondary | ICD-10-CM | POA: Diagnosis not present

## 2017-05-06 DIAGNOSIS — Z7409 Other reduced mobility: Secondary | ICD-10-CM

## 2017-05-06 DIAGNOSIS — R609 Edema, unspecified: Secondary | ICD-10-CM

## 2017-05-06 DIAGNOSIS — E7800 Pure hypercholesterolemia, unspecified: Secondary | ICD-10-CM

## 2017-05-06 DIAGNOSIS — I10 Essential (primary) hypertension: Secondary | ICD-10-CM | POA: Diagnosis not present

## 2017-05-06 DIAGNOSIS — Z0000 Encounter for general adult medical examination without abnormal findings: Secondary | ICD-10-CM | POA: Diagnosis not present

## 2017-05-06 DIAGNOSIS — Z9181 History of falling: Secondary | ICD-10-CM | POA: Diagnosis not present

## 2017-05-06 DIAGNOSIS — I739 Peripheral vascular disease, unspecified: Secondary | ICD-10-CM | POA: Diagnosis not present

## 2017-05-06 MED ORDER — MIRTAZAPINE 30 MG PO TABS: 30.0000 mg | ORAL_TABLET | Freq: Every day | ORAL | 11 refills | Status: DC

## 2017-05-06 MED ORDER — AMLODIPINE BESYLATE 5 MG PO TABS: 5.0000 mg | ORAL_TABLET | Freq: Every day | ORAL | 11 refills | Status: DC

## 2017-05-06 MED ORDER — FERROUS SULFATE 325 (65 FE) MG PO TABS: 325.0000 mg | ORAL_TABLET | Freq: Two times a day (BID) | ORAL | 11 refills | Status: DC

## 2017-05-06 MED ORDER — SIMVASTATIN 20 MG PO TABS: ORAL_TABLET | ORAL | 11 refills | Status: DC

## 2017-05-06 NOTE — Assessment & Plan Note (Signed)
In light of advanced age and multiple falls- feel that the risks of warfarin outweigh benefit Will d/c it  Inc asa to 325 mg daily

## 2017-05-06 NOTE — Assessment & Plan Note (Signed)
Multiple falls /mobility impairment Continue HH PT/OT to help with balance and strength  D/c warfarin due to fall risk

## 2017-05-06 NOTE — Assessment & Plan Note (Signed)
Pedal edema Declines supp hose  Improved with elevation

## 2017-05-06 NOTE — Assessment & Plan Note (Signed)
Stable with statin  Disc goals for lipids and reasons to control them Rev labs with pt Rev low sat fat diet in detail

## 2017-05-06 NOTE — Assessment & Plan Note (Signed)
Sees renal Labs are stable  Multifactorial Not symptomatic

## 2017-05-06 NOTE — Telephone Encounter (Signed)
Copied from CRM [phone]. Topic: General - Other >> May 06, 2017 12:48 PM Leafy Roobinson, Norma J wrote: Reason for CRM: ChadWest reynolds PT [facility] is calling requesting verbal order for this patient to have physical therapy for twice a wk for 5 wks

## 2017-05-06 NOTE — Telephone Encounter (Signed)
Left VM giving ChadWest the verbal order

## 2017-05-06 NOTE — Telephone Encounter (Signed)
Please ok that verbal order  

## 2017-05-06 NOTE — Assessment & Plan Note (Signed)
Some pedal edema  Declines supp hose- and improved with elevation of feet Will continue to watch Pedal pulses are palpable

## 2017-05-06 NOTE — Patient Instructions (Addendum)
Stop biotin if it is not helping skin and nails  Continue the iron  Take the multi vitamin daily  Continue calcium and vitamin D for bone health   Stop the warfarin and cancel protime appointments/checks Stop the 81 mg aspirin Get 325 mg aspirin over the counter and take one daily with a meal  I think the risk of the warfarin (with falls) is higher than risk of blood clots   Labs are stable   Continue follow up with your kidney doctor   Continue home care/ PT to help with balance and endurance and fall prevention

## 2017-05-06 NOTE — Assessment & Plan Note (Signed)
bp in fair control at this time in pt with CKD BP Readings from Last 1 Encounters:  05/06/17 136/68   No changes needed Disc lifstyle change with low sodium diet and exercise Controlled with amlodipine Labs rev-stable

## 2017-05-06 NOTE — Progress Notes (Signed)
I reviewed health advisor's note, was available for consultation, and agree with documentation and plan.  

## 2017-05-06 NOTE — Progress Notes (Signed)
Subjective:    Patient ID: Tracy Morales, female    DOB: 1919-11-10, 83 y.o.   MRN: [id number]  HPI Here for health maintenance exam and to review chronic medical problems    Wt Readings from Last 3 Encounters:  05/06/17 135 lb 8 oz (61.5 kg)  05/02/17 110 lb (49.9 kg)  04/10/17 131 lb (59.4 kg)   24.78 kg/m Appetite is not as good as it used to be   R foot - big toe overlaps over the 2nd  L foot swells at times  Does go to podiatrist on Friday -will mention this (they do her nails)   Getting leg cramps at night (did not help to hold statin)   She takes iron  She takes biotin Takes calcium  Wants to replace with mvi  Also to review need for home health care- with ongoing need (for frequent falls, weakness and general decline)   Nursing/PT/OT  -this was ordered 11/5  This is very helpful- the PT is helping her walk/prevent falls and help stamina Goal is to get stronger and prevent falls   Had amw on 11/29 Mini cog 18/20 (missed 2 of 3 words for recall) She occ has trouble with short term memory at her age  Depression score was 77  (frustrated with age and inability to do things)  Also lost her niece and her brother (she is the last one)  Declines  Counseling/does not like talking about mood and declines need for depression medication  utd eye exam and imms   dexa 2/01 -did not want to repeat  Has fallen at least 4 times in the past year  Doing better with PT - she lowered walker and has a better plan for getting in the shower  Getting the house set for a safer environment She thinks balance is starting to improve  No fractures  On ca and D  bp is stable today  In setting of hx of DD, CV dz and carotid dz and PVD No cp or palpitations or headaches or edema  No side effects to medicines  BP Readings from Last 3 Encounters:  05/06/17 136/68  05/02/17 (!) 142/70  04/10/17 (!) 174/77     Lab Results  Component Value Date   CREATININE 1.56 (H) 05/02/2017   BUN 35 (H) 05/02/2017   NA 138 05/02/2017   K 4.1 05/02/2017   CL 104 05/02/2017   CO2 27 05/02/2017  makes effort to drink more water    Lab Results  Component Value Date   ALT 14 05/02/2017   AST 28 05/02/2017   ALKPHOS 90 05/02/2017   BILITOT 0.5 05/02/2017   she sees renal for renal insuf  Takes simvastatin for cholesterol Lab Results  Component Value Date   CHOL 157 05/02/2017   CHOL 159 04/10/2016   CHOL 172 04/04/2015   Lab Results  Component Value Date   HDL 35.50 (L) 05/02/2017   HDL 34.70 (L) 04/10/2016   HDL 39.30 04/04/2015   Lab Results  Component Value Date   LDLCALC 109 (H) 05/02/2017   LDLCALC 105 (H) 04/10/2016   LDLCALC 120 (H) 04/04/2015   Lab Results  Component Value Date   TRIG 62.0 05/02/2017   TRIG 95.0 04/10/2016   TRIG 62.0 04/04/2015   Lab Results  Component Value Date   CHOLHDL 4 05/02/2017   CHOLHDL 5 04/10/2016   CHOLHDL 4 04/04/2015   No results found for: LDLDIRECT Still on atorvastatin -will continue it  H/o neuropathy- she has burning of the feet at night The home nurse had her elevate feet in her bed and that has helped a lot   Still take coumadin for hx of 2 DVT Is interested in stopping warfarin- due to her hx of falls and high fall risk  Patient Active Problem List   Diagnosis Date Noted  . Right buttock pain 11/16/2016  . History of DVT (deep vein thrombosis) 06/01/2016  . Encounter for therapeutic drug monitoring 06/01/2016  . Right leg pain 04/17/2016  . Osteoarthritis of both hips 04/15/2015  . Hip pain 04/14/2015  . Routine general medical examination at a health care facility 04/12/2015  . Hair loss 11/19/2014  . Loss of weight 06/09/2014  . Arthritis of right shoulder region 06/09/2014  . Nodule of left lung 06/09/2014  . Encounter for Medicare annual wellness exam 10/30/2013  . H/O fall 04/27/2013  . Risk for falls 04/11/2012  . Leg pain, bilateral 01/29/2012  . MICROSCOPIC HEMATURIA 08/29/2009  .  CONDUCTIVE HEARING LOSS BILATERAL 08/08/2009  . DVT 04/12/2009  . EDEMA 04/07/2009  . HYPERPOTASSEMIA 01/25/2009  . BRADYCARDIA 10/26/2008  . DYSPNEA 10/26/2008  . INCONTINENCE, URGE 02/27/2008  . Hyperlipidemia 10/11/2006  . PERIPHERAL NEUROPATHY, LOWER EXTREMITIES, BILATERAL 10/11/2006  . Essential hypertension 10/11/2006  . DIASTOLIC DYSFUNCTION 10/11/2006  . CAROTID STENOSIS 10/11/2006  . CEREBROVASCULAR DISEASE 10/11/2006  . PERIPHERAL VASCULAR DISEASE 10/11/2006  . ALLERGIC RHINITIS 10/11/2006  . Chronic kidney disease 10/11/2006  . OVERACTIVE BLADDER 10/11/2006  . OSTEOARTHRITIS 10/11/2006  . POLYMYALGIA RHEUMATICA, HX OF 10/11/2006  . CARDIAC MURMUR, HX OF 10/11/2006   Past Medical History:  Diagnosis Date  . Allergic rhinitis, cause unspecified   . Bradycardia   . Cerebrovascular disease, unspecified   . Chronic kidney disease, unspecified    chronic renal insufficiency  . Dizziness and giddiness   . DVT (deep venous thrombosis) (HCC)    R  . Dyspnea    echo with normal EF. +diastolic dysfunction  . 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 HTN (hypertension)   . Hyperlipidemia   . Hypertonicity of bladder    hx  . Mild depression (HCC)   . Mononeuritis of lower limb, unspecified    peripheral neuropathy, bilateral  . Occlusion and stenosis of carotid artery without mention of cerebral infarction 4/09   u/s L 40-59%, R 0-39%; stable for many years  . Osteoarthrosis, unspecified whether generalized or localized, unspecified site   . Peripheral vascular disease, unspecified (HCC)   . Polymyalgia rheumatica (HCC)    hx  . RUQ pain   . Urge incontinence    Past Surgical History:  Procedure Laterality Date  . 2D echo  8/06   mild aortic stenosis.   Marland Kitchen. arterial doppler    . carotid doppler  4/02   bilateral plaque   . carotid doppler  8/06   non significant stenosis  . carotid doppler     R 40%; L 40-60%  . CATARACT EXTRACTION    . CHOLECYSTECTOMY   2000  . COLONOSCOPY  1/04   polyps  . CT SCAN  05/25/08   abd and pelvis- inflated urinary bladder and large amt stool throughout w/o blockage   . dexa  2/01   osteopenia  . echo     mild LVH EF 50% As  . triger finger contracture  6/04  . vert. basilar insufficiency  2002  . zoster  5/02   Social History  Tobacco Use  . Smoking status: Never Smoker  . Smokeless tobacco: Never Used  . Tobacco comment: non smoker   Substance Use Topics  . Alcohol use: No    Alcohol/week: 0.0 oz  . Drug use: No   Family History  Problem Relation Age of Onset  . Hypertension Father   . Other Father        Cardiac problems  . Heart disease Father   . Other Brother        blood clot  . Other Sister        benign breast nodule   Allergies  Allergen Reactions  . Ace Inhibitors     REACTION: cough   Current Outpatient Medications on File Prior to Visit  Medication Sig Dispense Refill  . acetaminophen (TYLENOL) 500 MG tablet Take 500 mg by mouth every 6 (six) hours as needed. OTC-UAD     . aspirin 325 MG EC tablet Take 325 mg by mouth daily.    . Cholecalciferol (VITAMIN D3) 2000 units capsule Take 2,000 Units by mouth daily.    . Meclizine HCl (BONINE PO) Take by mouth as needed.    . Multiple Vitamin (MULTIVITAMIN) tablet Take 1 tablet by mouth daily.    . NON FORMULARY Walker with wheels - for use with ambulation once daily. 429.9, 715.90, 355.9     . psyllium (METAMUCIL) 58.6 % packet Take 1 packet by mouth daily.     No current facility-administered medications on file prior to visit.     Review of Systems  Constitutional: Positive for activity change and appetite change. Negative for fatigue, fever and unexpected weight change.  HENT: Negative for congestion, ear pain, rhinorrhea, sinus pressure and sore throat.   Eyes: Negative for pain, redness and visual disturbance.  Respiratory: Negative for cough, shortness of breath and wheezing.   Cardiovascular: Negative for chest pain  and palpitations.  Gastrointestinal: Negative for abdominal pain, blood in stool, constipation and diarrhea.  Endocrine: Negative for polydipsia and polyuria.  Genitourinary: Negative for dysuria, frequency and urgency.  Musculoskeletal: Positive for back pain, gait problem and myalgias. Negative for arthralgias.  Skin: Negative for pallor and rash.  Allergic/Immunologic: Negative for environmental allergies.  Neurological: Negative for dizziness, syncope and headaches.       Pos for poor balance  Hematological: Negative for adenopathy. Does not bruise/bleed easily.  Psychiatric/Behavioral: Negative for decreased concentration and dysphoric mood (mild). The patient is not nervous/anxious.        Objective:   Physical Exam  Constitutional: She appears well-developed and well-nourished. No distress.  Frail appearing elderly female   HENT:  Head: Normocephalic and atraumatic.  Mouth/Throat: Oropharynx is clear and moist.  Eyes: Conjunctivae and EOM are normal. Pupils are equal, round, and reactive to light. Right eye exhibits no discharge. Left eye exhibits no discharge. No scleral icterus.  Neck: Normal range of motion. Neck supple. No JVD present. Carotid bruit is not present. No thyromegaly present.  Cardiovascular: Normal rate, regular rhythm, normal heart sounds and intact distal pulses. Exam reveals no gallop.  Pulses are palpable in feet - plus one Symmetric temperature  Pulmonary/Chest: Effort normal and breath sounds normal. No respiratory distress. She has no wheezes. She has no rales.  No crackles  Abdominal: Soft. Bowel sounds are normal. She exhibits no distension, no abdominal bruit and no mass. There is no tenderness.  Musculoskeletal: She exhibits edema. She exhibits no tenderness.  Mild pedal edema worse on L   Great toe  overlaps 2nd toe on R foot without callus or ulcer formation     Lymphadenopathy:    She has no cervical adenopathy.  Neurological: She is alert.  She has normal reflexes. She displays tremor. She displays no atrophy. No cranial nerve deficit. She exhibits normal muscle tone. Gait abnormal.  Poor balance and core strength  Pt tends to lean backwards with gait  Needs assistance to rise from chair  Needs the help of 2 people to get on the table  Walks slowly with walker and assistance  Mild hand tremor   Skin: Skin is warm and dry. No rash noted. No pallor.  Psychiatric: She has a normal mood and affect. Her mood appears not anxious. Her affect is not blunt and not labile. She does not exhibit a depressed mood.  Pleasant today  Mentally sharp  Answers questions appropriately          Assessment & Plan:   Problem List Items Addressed This Visit      Cardiovascular and Mediastinum   Essential hypertension - Primary    bp in fair control at this time in pt with CKD BP Readings from Last 1 Encounters:  05/06/17 136/68   No changes needed Disc lifstyle change with low sodium diet and exercise Controlled with amlodipine Labs rev-stable         Relevant Medications   simvastatin (ZOCOR) 20 MG tablet   amLODipine (NORVASC) 5 MG tablet   aspirin 325 MG EC tablet   PERIPHERAL VASCULAR DISEASE    Some pedal edema  Declines supp hose- and improved with elevation of feet Will continue to watch Pedal pulses are palpable       Relevant Medications   simvastatin (ZOCOR) 20 MG tablet   amLODipine (NORVASC) 5 MG tablet   aspirin 325 MG EC tablet     Genitourinary   Chronic kidney disease    Sees renal Labs are stable  Multifactorial Not symptomatic         Other   EDEMA    Pedal edema Declines supp hose  Improved with elevation       History of DVT (deep vein thrombosis)    In light of advanced age and multiple falls- feel that the risks of warfarin outweigh benefit Will d/c it  Inc asa to 325 mg daily        Hyperlipidemia    Stable with statin  Disc goals for lipids and reasons to control them Rev  labs with pt Rev low sat fat diet in detail       Relevant Medications   simvastatin (ZOCOR) 20 MG tablet   amLODipine (NORVASC) 5 MG tablet   aspirin 325 MG EC tablet   Mobility impaired    With hx of multiple falls  Ordered HH for PT/OT   (and nursing for protimes which she previously needed and now will not)  Medically necessary for fall prev/strength and completion of ADLs  Continue therapy and nursing as needed  She will f/u with podiatry re: overlapping toes on R foot as well -improvement in pain may help gait      Risk for falls    Multiple falls /mobility impairment Continue HH PT/OT to help with balance and strength  D/c warfarin due to fall risk      Routine general medical examination at a health care facility    Reviewed health habits including diet and exercise and skin cancer prevention Reviewed appropriate screening tests for age  Also  reviewed health mt list, fam hx and immunization status , as well as social and family history   See HPI Labs reviewed  No screening at advance age Disc fall prevention and continue [facility]

## 2017-05-06 NOTE — Assessment & Plan Note (Signed)
With hx of multiple falls  Ordered HH for PT/OT   (and nursing for protimes which she previously needed and now will not)  Medically necessary for fall prev/strength and completion of ADLs  Continue therapy and nursing as needed  She will f/u with podiatry re: overlapping toes on R foot as well -improvement in pain may help gait

## 2017-05-06 NOTE — Assessment & Plan Note (Signed)
Reviewed health habits including diet and exercise and skin cancer prevention Reviewed appropriate screening tests for age  Also reviewed health mt list, fam hx and immunization status , as well as social and family history   See HPI Labs reviewed  No screening at advance age Disc fall prevention [facility]

## 2017-05-07 ENCOUNTER — Other Ambulatory Visit: Payer: Self-pay | Admitting: Family Medicine

## 2017-05-09 ENCOUNTER — Ambulatory Visit: Payer: Medicare Other | Admitting: Podiatry

## 2017-05-16 ENCOUNTER — Encounter: Payer: Self-pay | Admitting: Podiatry

## 2017-05-16 ENCOUNTER — Ambulatory Visit: Payer: Medicare Other | Admitting: Podiatry

## 2017-05-16 DIAGNOSIS — B351 Tinea unguium: Secondary | ICD-10-CM

## 2017-05-16 DIAGNOSIS — E1149 Type 2 diabetes mellitus with other diabetic neurological complication: Secondary | ICD-10-CM | POA: Diagnosis not present

## 2017-05-16 DIAGNOSIS — M79674 Pain in right toe(s): Secondary | ICD-10-CM

## 2017-05-16 DIAGNOSIS — M79675 Pain in left toe(s): Secondary | ICD-10-CM

## 2017-05-16 NOTE — Progress Notes (Signed)
Complaint:  Visit Type: Patient returns to my office for continued preventative foot care services. Complaint: Patient states" my nails have grown long and thick and become painful to walk and wear shoes" Patient has been diagnosed with DM with no foot complications. The patient presents for preventative foot care services. No changes to ROS  Podiatric Exam: Vascular: dorsalis pedis and posterior tibial pulses are palpable bilateral. Capillary return is immediate. Temperature gradient is WNL. Skin turgor WNL  Sensorium: Diminished  Semmes Weinstein monofilament test. Normal tactile sensation bilaterally. Nail Exam: Pt has thick disfigured discolored nails with subungual debris noted bilateral entire nail hallux through fifth toenails Ulcer Exam: There is no evidence of ulcer or pre-ulcerative changes or infection. Orthopedic Exam: Muscle tone and strength are WNL. No limitations in general ROM. No crepitus or effusions noted. Foot type and digits show no abnormalities.  Severe HAV  B/L with hammer toes.   Skin:  Porokeratosis sub 1 right foot. No infection or ulcers  Diagnosis:  Onychomycosis, , Pain in right toe, pain in left toes  Treatment & Plan Procedures and Treatment: Consent by patient was obtained for treatment procedures. The patient understood the discussion of treatment and procedures well. All questions were answered thoroughly reviewed. Debridement of mycotic and hypertrophic toenails, 1 through 5 bilateral and clearing of subungual debris. No ulceration, no infection noted.  Return Visit-Office Procedure: Patient instructed to return to the office for a follow up visit 3 months for continued evaluation and treatment.    Ravenna Legore DPM 

## 2017-05-21 ENCOUNTER — Ambulatory Visit: Payer: Medicare Other

## 2017-05-23 ENCOUNTER — Encounter: Payer: Self-pay | Admitting: Internal Medicine

## 2017-05-23 ENCOUNTER — Ambulatory Visit: Payer: Medicare Other | Admitting: Internal Medicine

## 2017-05-23 VITALS — BP 142/86 | HR 71 | Temp 97.6°F | Wt 133.0 lb

## 2017-05-23 DIAGNOSIS — N3000 Acute cystitis without hematuria: Secondary | ICD-10-CM

## 2017-05-23 DIAGNOSIS — R109 Unspecified abdominal pain: Secondary | ICD-10-CM | POA: Diagnosis not present

## 2017-05-23 DIAGNOSIS — R350 Frequency of micturition: Secondary | ICD-10-CM | POA: Diagnosis not present

## 2017-05-23 LAB — POC URINALSYSI DIPSTICK (AUTOMATED)
Bilirubin, UA: NEGATIVE
GLUCOSE UA: NEGATIVE
Ketones, UA: NEGATIVE
NITRITE UA: NEGATIVE
PROTEIN UA: NEGATIVE
SPEC GRAV UA: 1.01 (ref 1.010–1.025)
UROBILINOGEN UA: 0.2 U/dL
pH, UA: 6 (ref 5.0–8.0)

## 2017-05-23 MED ORDER — NITROFURANTOIN MONOHYD MACRO 100 MG PO CAPS: 100.0000 mg | ORAL_CAPSULE | Freq: Two times a day (BID) | ORAL | 0 refills | Status: DC

## 2017-05-26 ENCOUNTER — Encounter: Payer: Self-pay | Admitting: Internal Medicine

## 2017-05-26 LAB — URINE CULTURE
MICRO NUMBER: 81434038
SPECIMEN QUALITY:: ADEQUATE

## 2017-05-26 NOTE — Progress Notes (Signed)
HPI  Pt presents to the clinic today with c/o urinary frequency and left flank pian. This started 4 days ago. She denies urgency, dysuria or blood in her urine. She does have some issues with incontinence. She denies fever, chills or nausea. She has not taken anything OTC for her symptoms.    Review of Systems  Past Medical History:  Diagnosis Date  . Allergic rhinitis, cause unspecified   . Bradycardia   . Cerebrovascular disease, unspecified   . Chronic kidney disease, unspecified    chronic renal insufficiency  . Dizziness and giddiness   . DVT (deep venous thrombosis) (HCC)    R  . Dyspnea    echo with normal EF. +diastolic dysfunction  . 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 HTN (hypertension)   . Hyperlipidemia   . Hypertonicity of bladder    hx  . Mild depression (HCC)   . Mononeuritis of lower limb, unspecified    peripheral neuropathy, bilateral  . Occlusion and stenosis of carotid artery without mention of cerebral infarction 4/09   u/s L 40-59%, R 0-39%; stable for many years  . Osteoarthrosis, unspecified whether generalized or localized, unspecified site   . Peripheral vascular disease, unspecified (HCC)   . Polymyalgia rheumatica (HCC)    hx  . RUQ pain   . Urge incontinence     Family History  Problem Relation Age of Onset  . Hypertension Father   . Other Father        Cardiac problems  . Heart disease Father   . Other Brother        blood clot  . Other Sister        benign breast nodule    Social History   Socioeconomic History  . Marital status: Widowed    Spouse name: Not on file  . Number of children: 2  . Years of education: Not on file  . Highest education level: Not on file  Social Needs  . Financial resource strain: Not on file  . Food insecurity - worry: Not on file  . Food insecurity - inability: Not on file  . Transportation needs - medical: Not on file  . Transportation needs - non-medical: Not on file  Occupational  History  . Occupation: Retired  Tobacco Use  . Smoking status: Never Smoker  . Smokeless tobacco: Never Used  . Tobacco comment: non smoker   Substance and Sexual Activity  . Alcohol use: No    Alcohol/week: 0.0 oz  . Drug use: No  . Sexual activity: No  Other Topics Concern  . Not on file  Social History Narrative   Widowed. 2 children. Retired, gets regular exercise.     Allergies  Allergen Reactions  . Ace Inhibitors     REACTION: cough     Constitutional: Denies fever, malaise, fatigue, headache or abrupt weight changes.   GU: Pt reports frequency and flank pain. Denies urgency, dysuria,  burning sensation, blood in urine, odor or discharge. Skin: Denies redness, rashes, lesions or ulcercations.   No other specific complaints in a complete review of systems (except as listed in HPI above).    Objective:   Physical Exam  BP (!) 142/86   Pulse 71   Temp 97.6 F (36.4 C) (Oral)   Wt 133 lb (60.3 kg)   SpO2 98%   BMI 24.33 kg/m  Wt Readings from Last 3 Encounters:  05/23/17 133 lb (60.3 kg)  05/06/17 135 lb  8 oz (61.5 kg)  05/02/17 110 lb (49.9 kg)    General: Appears her stated age, in NAD.  Abdomen: Soft. Normal bowel sounds. No distention or masses noted.  Tender to palpation over the bladder area. No CVA tenderness.       Assessment & Plan:   Frequency, Flank Pain secondary to UTI:  Urinalysis: 3+ leuks, trace blood Will send urine culture eRx sent if for Macrobid 100 mg BID x 5 days OK to take AZO OTC Drink plenty of fluids  RTC as needed or if symptoms persist. Nicki ReaperBAITY, REGINA, NP

## 2017-05-26 NOTE — Patient Instructions (Signed)

## 2017-06-10 ENCOUNTER — Telehealth: Payer: Self-pay | Admitting: Family Medicine

## 2017-06-10 NOTE — Telephone Encounter (Signed)
Please ok those verbal orders  

## 2017-06-10 NOTE — Telephone Encounter (Signed)
Copied from CRM [id number]. Topic: Inquiry >> Jun 10, 2017  3:57 PM Anice PaganiniMunoz, Davis Ambrosini I, NT wrote: Reason for CRM: Verdene [facility] [place] call for Verbal Danise MinaOr[place] For the pt to have Physical Therapy 2 per weeks for 6 weeks for more inf call Verdene Lenn[facility] @ [phone]

## 2017-06-10 NOTE — Telephone Encounter (Signed)
Verbal order given to [place]  

## 2017-06-13 ENCOUNTER — Ambulatory Visit: Payer: Self-pay | Admitting: General Practice

## 2017-06-13 ENCOUNTER — Telehealth: Payer: Self-pay | Admitting: Family Medicine

## 2017-06-13 NOTE — Telephone Encounter (Signed)
[facility] on [facility] cell to please call me back to discuss this.

## 2017-06-13 NOTE — Telephone Encounter (Signed)
Copied from CRM [phone]. Topic: Quick Communication - See Telephone Encounter >> Jun 13, 2017 12:27 PM Cipriano BunkerLambe, Annette S wrote: CRM for notification. See Telephone encounter for:   [facility] Helene ShoeKendice Johnson [phone] Asking about prescription for Coumadin saying she was taken off of this and Raymond GurneyKendice is asking if she was given a replacement medication? Please call and let her know    06/13/17.

## 2017-06-14 NOTE — Telephone Encounter (Signed)
Notified [name] that per Dr. Royden Purlower's 04/2017 office note, patient's coumadin was d/c'ed due to safety/falls risk and she was placed on ASA  EC 325mg  daily.

## 2017-07-08 ENCOUNTER — Encounter: Payer: Self-pay | Admitting: Podiatry

## 2017-07-08 ENCOUNTER — Ambulatory Visit: Payer: Medicare Other | Admitting: Podiatry

## 2017-07-08 DIAGNOSIS — Q828 Other specified congenital malformations of skin: Secondary | ICD-10-CM

## 2017-07-08 DIAGNOSIS — E1149 Type 2 diabetes mellitus with other diabetic neurological complication: Secondary | ICD-10-CM

## 2017-07-08 NOTE — Progress Notes (Signed)
She presents today chief complaint of painful toes of distal aspect.  She states they are hurting because the rubbing and getting these calluses.  Objective: Vital signs are stable alert and oriented x3.  Pulses are palpable.  Severe hammertoe deformities resulting in distal clavi which are painful on palpation as well as debridement.  There are no open wounds noted.  Assessment distal clavi lesser digits 2 3 and 4 on the right foot.  She also has a sub-first metatarsal head lesion.  Plan: After sterile Betadine skin prep I debrided all these lesions today.  I will follow-up with her on an as-needed basis she will continue to have her nails trimmed.  I also placed her in a buttress pad to keep her toes off the floor.

## 2017-07-10 DIAGNOSIS — F329 Major depressive disorder, single episode, unspecified: Secondary | ICD-10-CM

## 2017-07-10 DIAGNOSIS — N189 Chronic kidney disease, unspecified: Secondary | ICD-10-CM | POA: Diagnosis not present

## 2017-07-10 DIAGNOSIS — Z7901 Long term (current) use of anticoagulants: Secondary | ICD-10-CM

## 2017-07-10 DIAGNOSIS — I2510 Atherosclerotic heart disease of native coronary artery without angina pectoris: Secondary | ICD-10-CM

## 2017-07-10 DIAGNOSIS — I6529 Occlusion and stenosis of unspecified carotid artery: Secondary | ICD-10-CM

## 2017-07-10 DIAGNOSIS — Z9181 History of falling: Secondary | ICD-10-CM

## 2017-07-10 DIAGNOSIS — I129 Hypertensive chronic kidney disease with stage 1 through stage 4 chronic kidney disease, or unspecified chronic kidney disease: Secondary | ICD-10-CM

## 2017-07-10 DIAGNOSIS — G629 Polyneuropathy, unspecified: Secondary | ICD-10-CM

## 2017-07-10 DIAGNOSIS — Z86718 Personal history of other venous thrombosis and embolism: Secondary | ICD-10-CM

## 2017-07-10 DIAGNOSIS — I739 Peripheral vascular disease, unspecified: Secondary | ICD-10-CM

## 2017-07-10 DIAGNOSIS — M160 Bilateral primary osteoarthritis of hip: Secondary | ICD-10-CM

## 2017-07-10 DIAGNOSIS — M19011 Primary osteoarthritis, right shoulder: Secondary | ICD-10-CM

## 2017-07-10 DIAGNOSIS — M353 Polymyalgia rheumatica: Secondary | ICD-10-CM

## 2017-07-10 DIAGNOSIS — Z8673 Personal history of transient ischemic attack (TIA), and cerebral infarction without residual deficits: Secondary | ICD-10-CM

## 2017-07-10 DIAGNOSIS — H900 Conductive hearing loss, bilateral: Secondary | ICD-10-CM

## 2017-07-12 DIAGNOSIS — M353 Polymyalgia rheumatica: Secondary | ICD-10-CM | POA: Diagnosis not present

## 2017-07-12 DIAGNOSIS — M160 Bilateral primary osteoarthritis of hip: Secondary | ICD-10-CM | POA: Diagnosis not present

## 2017-07-12 DIAGNOSIS — R296 Repeated falls: Secondary | ICD-10-CM | POA: Diagnosis not present

## 2017-07-12 DIAGNOSIS — G629 Polyneuropathy, unspecified: Secondary | ICD-10-CM | POA: Diagnosis not present

## 2017-07-12 DIAGNOSIS — N189 Chronic kidney disease, unspecified: Secondary | ICD-10-CM | POA: Diagnosis not present

## 2017-07-12 DIAGNOSIS — H900 Conductive hearing loss, bilateral: Secondary | ICD-10-CM | POA: Diagnosis not present

## 2017-07-12 DIAGNOSIS — I2510 Atherosclerotic heart disease of native coronary artery without angina pectoris: Secondary | ICD-10-CM | POA: Diagnosis not present

## 2017-07-12 DIAGNOSIS — I6529 Occlusion and stenosis of unspecified carotid artery: Secondary | ICD-10-CM | POA: Diagnosis not present

## 2017-07-12 DIAGNOSIS — M19011 Primary osteoarthritis, right shoulder: Secondary | ICD-10-CM | POA: Diagnosis not present

## 2017-07-12 DIAGNOSIS — Z86718 Personal history of other venous thrombosis and embolism: Secondary | ICD-10-CM | POA: Diagnosis not present

## 2017-07-12 DIAGNOSIS — Z9181 History of falling: Secondary | ICD-10-CM

## 2017-07-12 DIAGNOSIS — I129 Hypertensive chronic kidney disease with stage 1 through stage 4 chronic kidney disease, or unspecified chronic kidney disease: Secondary | ICD-10-CM | POA: Diagnosis not present

## 2017-07-12 DIAGNOSIS — F329 Major depressive disorder, single episode, unspecified: Secondary | ICD-10-CM

## 2017-07-12 DIAGNOSIS — Z8673 Personal history of transient ischemic attack (TIA), and cerebral infarction without residual deficits: Secondary | ICD-10-CM

## 2017-07-12 DIAGNOSIS — Z7901 Long term (current) use of anticoagulants: Secondary | ICD-10-CM

## 2017-07-12 DIAGNOSIS — I739 Peripheral vascular disease, unspecified: Secondary | ICD-10-CM | POA: Diagnosis not present

## 2017-08-15 ENCOUNTER — Encounter: Payer: Self-pay | Admitting: Podiatry

## 2017-08-15 ENCOUNTER — Ambulatory Visit: Payer: Medicare Other | Admitting: Podiatry

## 2017-08-15 DIAGNOSIS — M79675 Pain in left toe(s): Secondary | ICD-10-CM | POA: Diagnosis not present

## 2017-08-15 DIAGNOSIS — E1149 Type 2 diabetes mellitus with other diabetic neurological complication: Secondary | ICD-10-CM | POA: Diagnosis not present

## 2017-08-15 DIAGNOSIS — B351 Tinea unguium: Secondary | ICD-10-CM | POA: Diagnosis not present

## 2017-08-15 DIAGNOSIS — M79674 Pain in right toe(s): Secondary | ICD-10-CM | POA: Diagnosis not present

## 2017-08-15 NOTE — Progress Notes (Signed)
Complaint:  Visit Type: Patient returns to my office for continued preventative foot care services. Complaint: Patient states" my nails have grown long and thick and become painful to walk and wear shoes" Patient has been diagnosed with DM with no foot complications. The patient presents for preventative foot care services. No changes to ROS  Podiatric Exam: Vascular: dorsalis pedis and posterior tibial pulses are palpable bilateral. Capillary return is immediate. Temperature gradient is WNL. Skin turgor WNL  Sensorium: Diminished  Semmes Weinstein monofilament test. Normal tactile sensation bilaterally. Nail Exam: Pt has thick disfigured discolored nails with subungual debris noted bilateral entire nail hallux through fifth toenails Ulcer Exam: There is no evidence of ulcer or pre-ulcerative changes or infection. Orthopedic Exam: Muscle tone and strength are WNL. No limitations in general ROM. No crepitus or effusions noted. Foot type and digits show no abnormalities.  Severe HAV  B/L with hammer toes.   Skin:  Porokeratosis sub 1 right foot. No infection or ulcers  Diagnosis:  Onychomycosis, , Pain in right toe, pain in left toes  Treatment & Plan Procedures and Treatment: Consent by patient was obtained for treatment procedures. The patient understood the discussion of treatment and procedures well. All questions were answered thoroughly reviewed. Debridement of mycotic and hypertrophic toenails, 1 through 5 bilateral and clearing of subungual debris. No ulceration, no infection noted.  Return Visit-Office Procedure: Patient instructed to return to the office for a follow up visit 3 months for continued evaluation and treatment.    Shaquaya Wuellner DPM 

## 2017-11-18 ENCOUNTER — Ambulatory Visit: Payer: Medicare Other | Admitting: Podiatry

## 2017-11-18 ENCOUNTER — Encounter: Payer: Self-pay | Admitting: Podiatry

## 2017-11-18 DIAGNOSIS — M79674 Pain in right toe(s): Secondary | ICD-10-CM | POA: Diagnosis not present

## 2017-11-18 DIAGNOSIS — B351 Tinea unguium: Secondary | ICD-10-CM | POA: Diagnosis not present

## 2017-11-18 DIAGNOSIS — Q828 Other specified congenital malformations of skin: Secondary | ICD-10-CM | POA: Diagnosis not present

## 2017-11-18 DIAGNOSIS — M79675 Pain in left toe(s): Secondary | ICD-10-CM

## 2017-11-18 DIAGNOSIS — E1149 Type 2 diabetes mellitus with other diabetic neurological complication: Secondary | ICD-10-CM

## 2017-11-18 NOTE — Progress Notes (Signed)
Complaint:  Visit Type: Patient returns to my office for continued preventative foot care services. Complaint: Patient states" my nails have grown long and thick and become painful to walk and wear shoes" Patient has been diagnosed with DM with no foot complications. The patient presents for preventative foot care services. No changes to ROS  Podiatric Exam: Vascular: dorsalis pedis and posterior tibial pulses are palpable bilateral. Capillary return is immediate. Temperature gradient is WNL. Skin turgor WNL  Sensorium: Diminished  Semmes Weinstein monofilament test. Normal tactile sensation bilaterally. Nail Exam: Pt has thick disfigured discolored nails with subungual debris noted bilateral entire nail hallux through fifth toenails Ulcer Exam: There is no evidence of ulcer or pre-ulcerative changes or infection. Orthopedic Exam: Muscle tone and strength are WNL. No limitations in general ROM. No crepitus or effusions noted. Foot type and digits show no abnormalities.  Severe HAV  B/L with hammer toes.   Skin:  Porokeratosis sub 1 right foot. No infection or ulcers  Diagnosis:  Onychomycosis, , Pain in right toe, pain in left toes,  Porokeratosis right nfoot  Treatment & Plan Procedures and Treatment: Consent by patient was obtained for treatment procedures. The patient understood the discussion of treatment and procedures well. All questions were answered thoroughly reviewed. Debridement of mycotic and hypertrophic toenails, 1 through 5 bilateral and clearing of subungual debris. No ulceration, no infection noted. Debride porokeratosis Return Visit-Office Procedure: Patient instructed to return to the office for a follow up visit 3 months for continued evaluation and treatment.    Helane GuntherGregory Annice Jolly DPM

## 2017-12-11 ENCOUNTER — Encounter: Payer: Self-pay | Admitting: Family Medicine

## 2017-12-11 ENCOUNTER — Ambulatory Visit: Payer: Medicare Other | Admitting: Family Medicine

## 2017-12-11 VITALS — BP 156/72 | HR 86 | Temp 97.9°F | Ht 62.0 in

## 2017-12-11 DIAGNOSIS — R296 Repeated falls: Secondary | ICD-10-CM | POA: Diagnosis not present

## 2017-12-11 DIAGNOSIS — R2689 Other abnormalities of gait and mobility: Secondary | ICD-10-CM

## 2017-12-11 DIAGNOSIS — W19XXXA Unspecified fall, initial encounter: Secondary | ICD-10-CM

## 2017-12-11 DIAGNOSIS — S20222A Contusion of left back wall of thorax, initial encounter: Secondary | ICD-10-CM | POA: Diagnosis not present

## 2017-12-11 NOTE — Progress Notes (Signed)
Subjective:    Patient ID: Allen DerryAnnie S Rice, female    DOB: 1919/08/18, 84 y.o.   MRN: [id number]  HPI This is a 84 yo female who presents today following a fall that occurred 4 days ago. She was standing in her kitchen cooking breakfast and turned quickly from side to side and fell backward, hitting a table leg with her back. She was unable to get up on her own. Her grandson helped her up. She denies hitting her head. She denies LOC. Her back hurts but is better today than previous days. She denies dizziness/lightheadedness, loss of bowel or bladder control, increased leg weakness, hip pain, bruising, numbness or tingling. [facility] and taking two Tylenol in the morning and at bed time with some improvement of pain.   She fell two weeks ago. Always falling backwards. Usually after turning her body to side. Uses her walker at home. Has life alert.   Past Medical History:  Diagnosis Date  . Allergic rhinitis, cause unspecified   . Bradycardia   . Cerebrovascular disease, unspecified   . Chronic kidney disease, unspecified    chronic renal insufficiency  . Dizziness and giddiness   . DVT (deep venous thrombosis) (HCC)    R  . Dyspnea    echo with normal EF. +diastolic dysfunction  . 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 HTN (hypertension)   . Hyperlipidemia   . Hypertonicity of bladder    hx  . Mild depression (HCC)   . Mononeuritis of lower limb, unspecified    peripheral neuropathy, bilateral  . Occlusion and stenosis of carotid artery without mention of cerebral infarction 4/09   u/s L 40-59%, R 0-39%; stable for many years  . Osteoarthrosis, unspecified whether generalized or localized, unspecified site   . Peripheral vascular disease, unspecified (HCC)   . Polymyalgia rheumatica (HCC)    hx  . RUQ pain   . Urge incontinence    Past Surgical History:  Procedure Laterality Date  . 2D echo  8/06   mild aortic stenosis.   Marland Kitchen. arterial doppler      . carotid doppler  4/02   bilateral plaque   . carotid doppler  8/06   non significant stenosis  . carotid doppler     R 40%; L 40-60%  . CATARACT EXTRACTION    . CHOLECYSTECTOMY  2000  . COLONOSCOPY  1/04   polyps  . CT SCAN  05/25/08   abd and pelvis- inflated urinary bladder and large amt stool throughout w/o blockage   . dexa  2/01   osteopenia  . echo     mild LVH EF 50% As  . triger finger contracture  6/04  . vert. basilar insufficiency  2002  . zoster  5/02   Family History  Problem Relation Age of Onset  . Hypertension Father   . Other Father        Cardiac problems  . Heart disease Father   . Other Brother        blood clot  . Other Sister        benign breast nodule   Social History   Tobacco Use  . Smoking status: Never Smoker  . Smokeless tobacco: Never Used  . Tobacco comment: non smoker   Substance Use Topics  . Alcohol use: No    Alcohol/week: 0.0 oz  . Drug use: No      Review of Systems Per  HPI    Objective:   Physical Exam  Constitutional: She appears well-developed and well-nourished. No distress.  Frail appearing female in NAD, sitting in wheelchair.   HENT:  Head: Normocephalic and atraumatic.  Cardiovascular: Normal rate, regular rhythm and normal heart sounds.  Pulmonary/Chest: Effort normal and breath sounds normal.  Musculoskeletal:  UE/LE strength 3/5, = bilaterally.  Tender left side of low back.  Able to go from sit to stand, stand to sit with assistance.   Neurological: She is alert.  Answers questions appropriately.   Skin: Skin is warm and dry. She is not diaphoretic.  No bruising or ecchymosis of back or buttocks.   Psychiatric: She has a normal mood and affect. Her behavior is normal.  Vitals reviewed.     BP (!) 156/72 (BP Location: Right Arm, Patient Position: Sitting, Cuff Size: Large)   Pulse 86   Temp 97.9 F (36.6 C) (Oral)   Ht 5\' 2"  (1.575 m)   SpO2 97%   BMI 24.33 kg/m  Wt Readings from Last 3  Encounters:  05/23/17 133 lb (60.3 kg)  05/06/17 135 lb 8 oz (61.5 kg)  05/02/17 110 lb (49.9 kg)   BP Readings from Last 3 Encounters:  12/11/17 (!) 156/72  05/23/17 (!) 142/86  05/06/17 136/68       Assessment & Plan:  1. Fall, initial encounter - Discussed with patient and daughter and they are in agreement to have some home PT to work on balance and strength  2. Frequent falls - Ambulatory referral to Home Health - Encouraged her to use walker at all times, to avoid quick movements, especially side to side  3. Loss of balance - Ambulatory referral to Home Health  4. Contusion of left side of back, initial encounter - continue Tylenol, topical pain reliever - Ambulatory referral to Home Health - RTC precautions reviewed  Olean Ree, FNP-BC  [facility], [facility]  12/11/2017 6:04 PM

## 2017-12-11 NOTE — Patient Instructions (Signed)
Continue tylenol twice a day Icy hot as needed   Contusion A contusion is a deep bruise. Contusions happen when an injury causes bleeding under the skin. Symptoms of bruising include pain, swelling, and discolored skin. The skin may turn blue, purple, or yellow. Follow these instructions at home:  Rest the injured area.  If told, put ice on the injured area. ? Put ice in a plastic bag. ? Place a towel between your skin and the bag. ? Leave the ice on for 20 minutes, 2-3 times per day.  If told, put light pressure (compression) on the injured area using an elastic bandage. Make sure the bandage is not too tight. Remove it and put it back on as told by your doctor.  If possible, raise (elevate) the injured area above the level of your heart while you are sitting or lying down.  Take over-the-counter and prescription medicines only as told by your doctor. Contact a doctor if:  Your symptoms do not get better after several days of treatment.  Your symptoms get worse.  You have trouble moving the injured area. Get help right away if:  You have very bad pain.  You have a loss of feeling (numbness) in a hand or foot.  Your hand or foot turns pale or cold. This information is not intended to replace advice given to you by your health care provider. Make sure you discuss any questions you have with your health care provider. Document Released: 11/07/2007 Document Revised: 10/27/2015 Document Reviewed: 10/06/2014 Elsevier Interactive Patient Education  2018 [employer]

## 2017-12-23 IMAGING — CR DG RIBS W/ CHEST 3+V*R*
1 series · 3 of 3 positions shown · non-contrast
Comparison: CT 06/11/2014

CLINICAL DATA: unwitnessed fall today. Pt not complaining of any
pain. No obvious injures. EMS came to house and recommended that pt
be seen at ER due to high blood pressure when it was checked.PT was
standing and folding clothes when she fell. Pt unsure if she tripped
and fell or not.

EXAM:
RIGHT RIBS AND CHEST - 3+ VIEW

[Series 1: dg ribs unilateral w/chest right · 0.14mm/px · 3 of 3 slices shown]
[im 1/3]
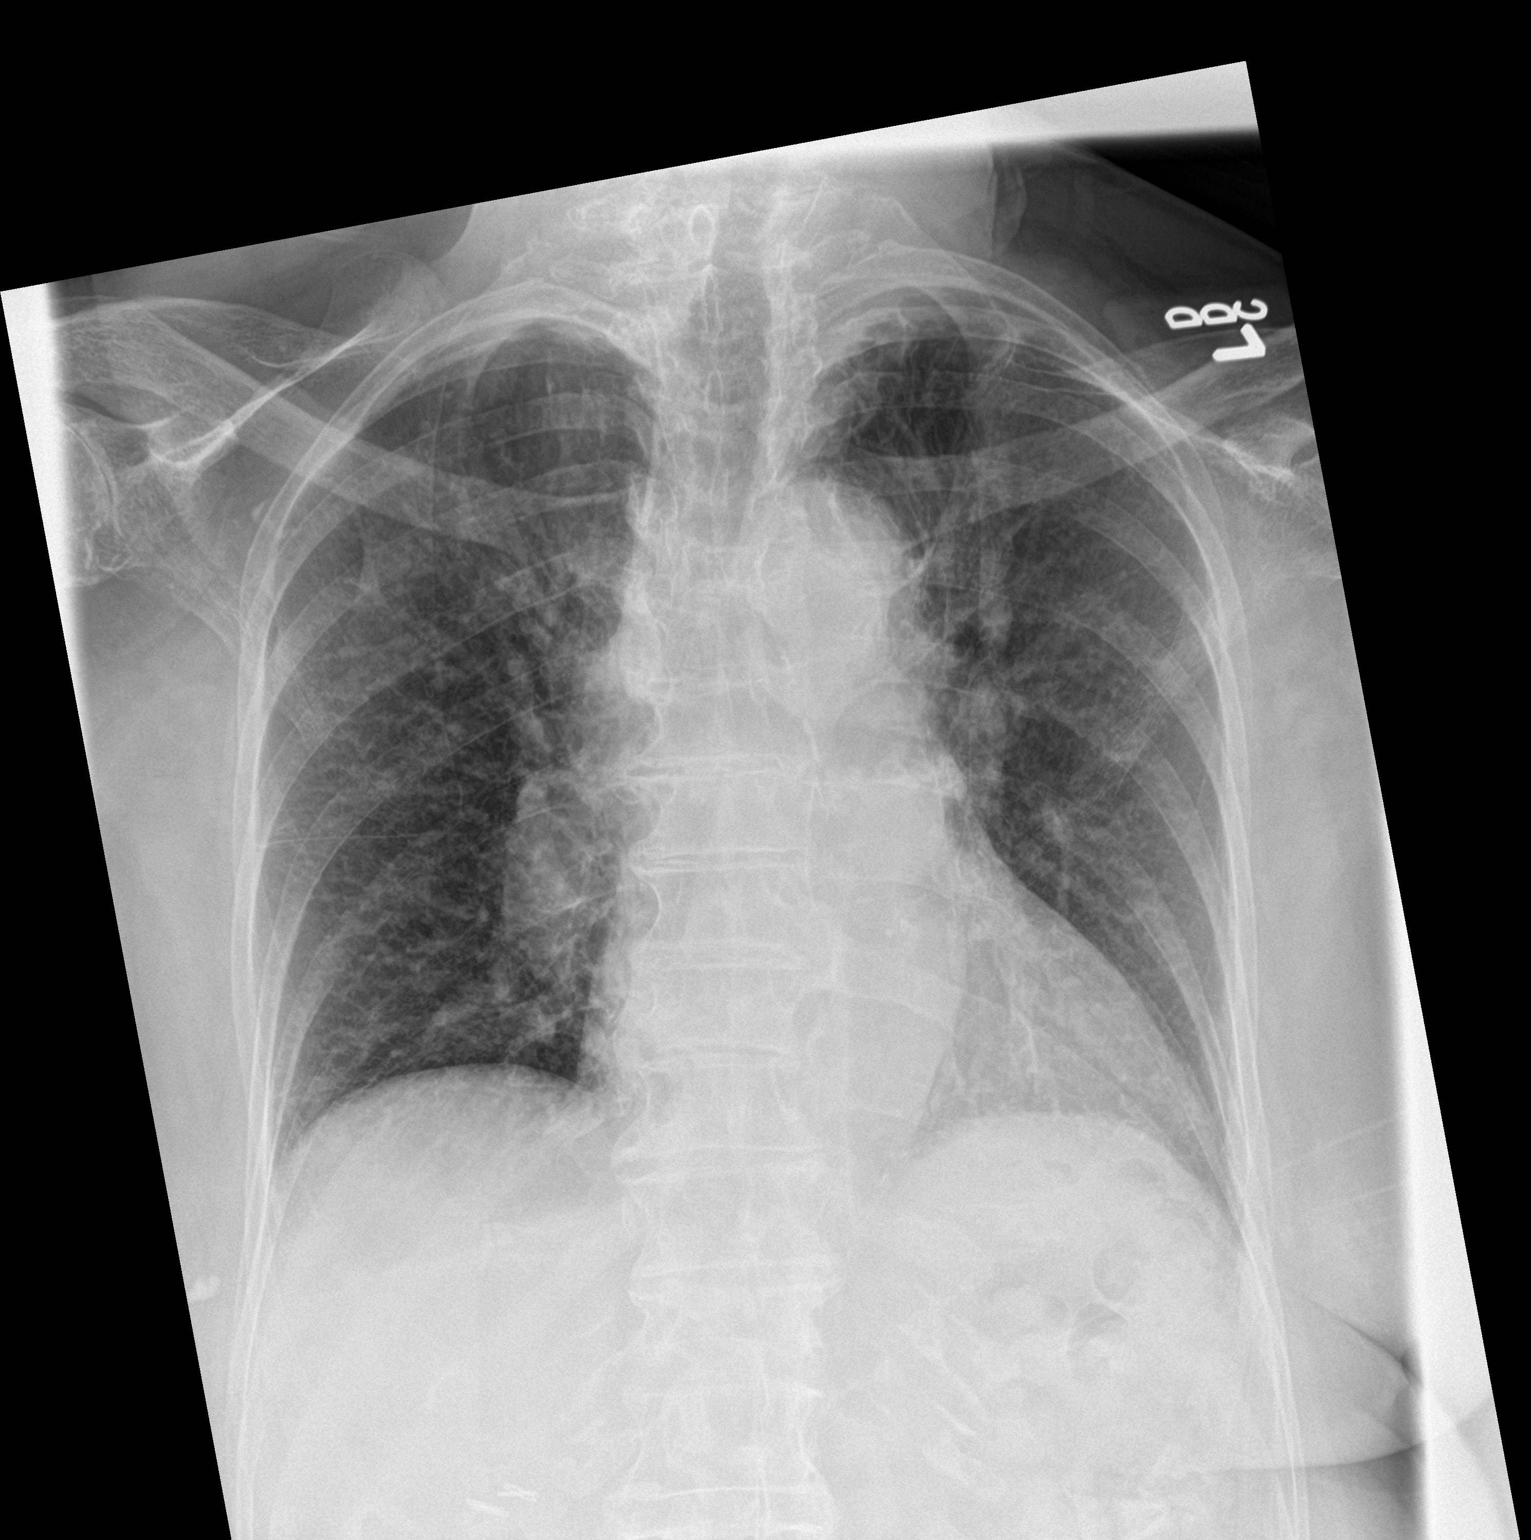
[im 2/3]
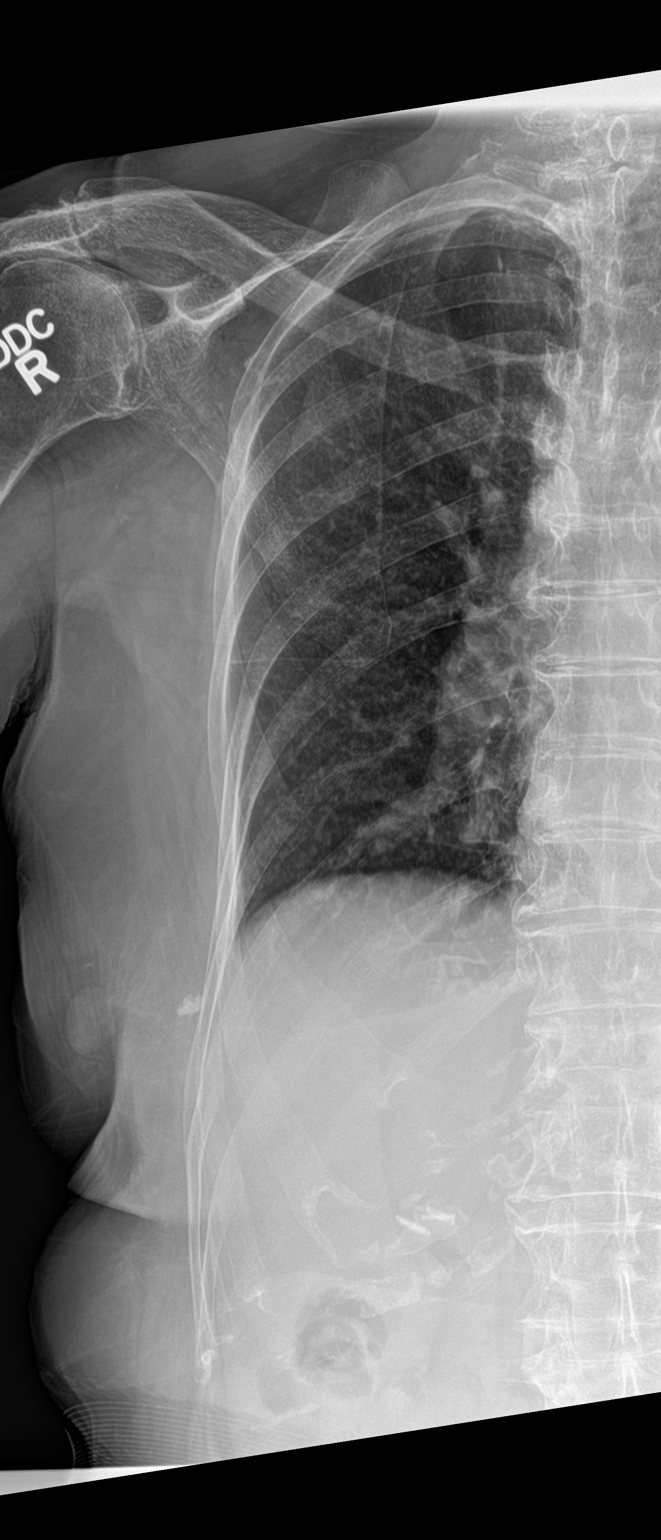
[im 3/3]
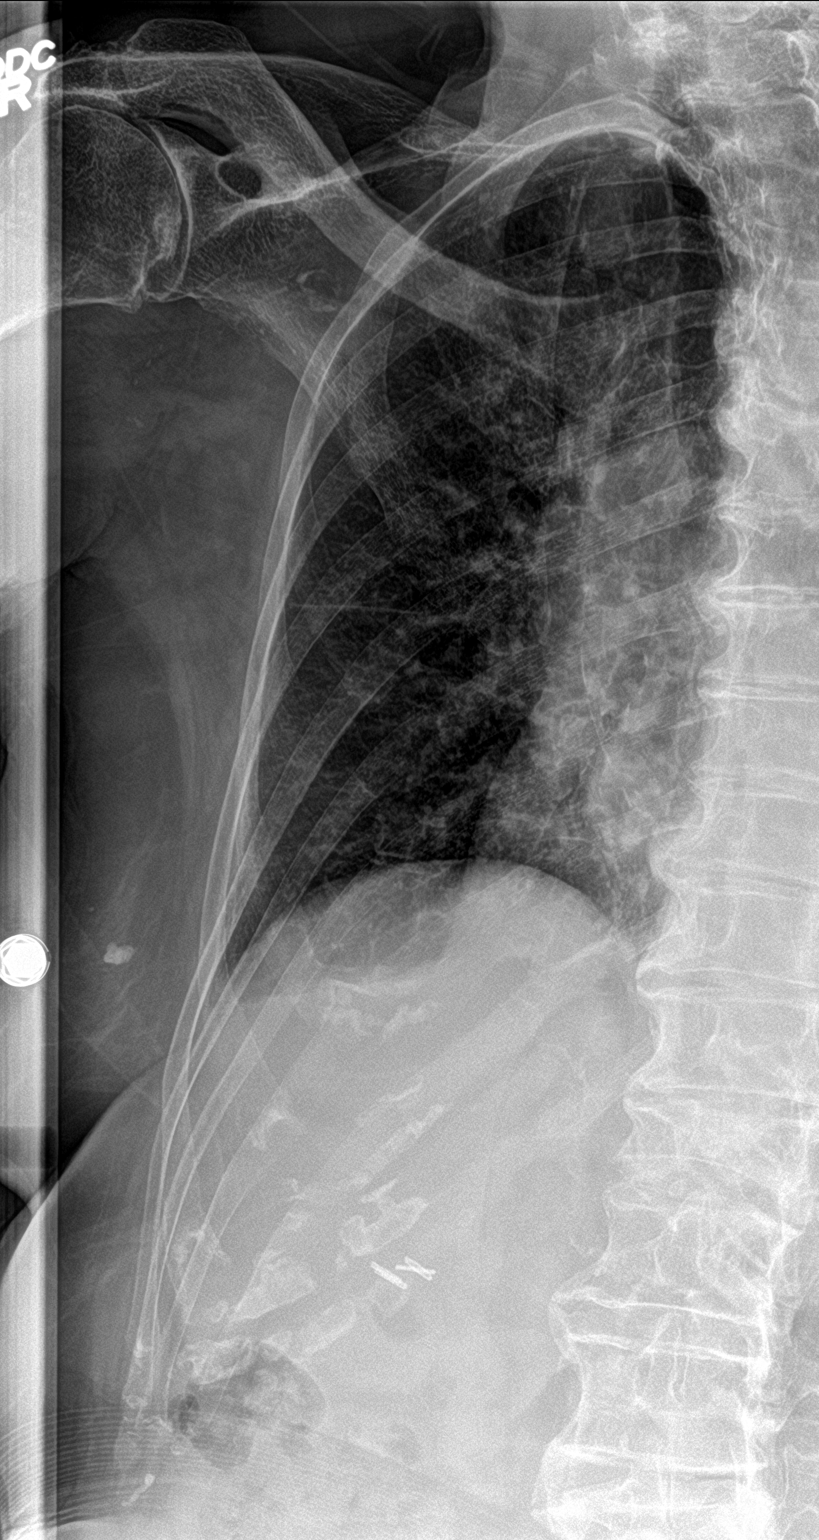

[3 of 3 positions shown; findings below may reference images not displayed]

FINDINGS: No fracture or other bone lesions are seen involving the ribs. There
is no evidence of pneumothorax or pleural effusion. Both lungs are
clear. Heart size and mediastinal contours are within normal limits.
Tortuous thoracic aorta. Surgical clips right upper abdomen.
Degenerative changes in the right shoulder are incidentally noted.
Old L1 compression fracture deformity.
IMPRESSION: 1. Negative for displaced  fracture or pneumothorax.

## 2018-02-17 ENCOUNTER — Ambulatory Visit: Payer: Medicare Other | Admitting: Podiatry

## 2018-02-17 ENCOUNTER — Encounter: Payer: Self-pay | Admitting: Podiatry

## 2018-02-17 DIAGNOSIS — E1149 Type 2 diabetes mellitus with other diabetic neurological complication: Secondary | ICD-10-CM | POA: Diagnosis not present

## 2018-02-17 DIAGNOSIS — M79674 Pain in right toe(s): Secondary | ICD-10-CM | POA: Diagnosis not present

## 2018-02-17 DIAGNOSIS — M79675 Pain in left toe(s): Secondary | ICD-10-CM | POA: Diagnosis not present

## 2018-02-17 DIAGNOSIS — B351 Tinea unguium: Secondary | ICD-10-CM | POA: Diagnosis not present

## 2018-02-17 NOTE — Progress Notes (Signed)
Complaint:  Visit Type: Patient returns to my office for continued preventative foot care services. Complaint: Patient states" my nails have grown long and thick and become painful to walk and wear shoes" Patient has been diagnosed with DM with no foot complications. The patient presents for preventative foot care services. No changes to ROS  Podiatric Exam: Vascular: dorsalis pedis and posterior tibial pulses are palpable bilateral. Capillary return is immediate. Temperature gradient is WNL. Skin turgor WNL  Sensorium: Diminished  Semmes Weinstein monofilament test. Normal tactile sensation bilaterally. Nail Exam: Pt has thick disfigured discolored nails with subungual debris noted bilateral entire nail hallux through fifth toenails Ulcer Exam: There is no evidence of ulcer or pre-ulcerative changes or infection. Orthopedic Exam: Muscle tone and strength are WNL. No limitations in general ROM. No crepitus or effusions noted. Foot type and digits show no abnormalities.  Severe HAV  B/L with hammer toes.   Skin:  Porokeratosis sub 1 right foot. No infection or ulcers  Diagnosis:  Onychomycosis, , Pain in right toe, pain in left toes,    Treatment & Plan Procedures and Treatment: Consent by patient was obtained for treatment procedures. The patient understood the discussion of treatment and procedures well. All questions were answered thoroughly reviewed. Debridement of mycotic and hypertrophic toenails, 1 through 5 bilateral and clearing of subungual debris. No ulceration, no infection noted. Return Visit-Office Procedure: Patient instructed to return to the office for a follow up visit 3 months for continued evaluation and treatment.    Helane GuntherGregory Fallou Hulbert DPM

## 2018-02-28 ENCOUNTER — Ambulatory Visit: Payer: Medicare Other | Admitting: Family Medicine

## 2018-02-28 ENCOUNTER — Encounter: Payer: Self-pay | Admitting: Family Medicine

## 2018-02-28 VITALS — BP 132/82 | HR 81 | Temp 97.7°F | Ht 62.0 in | Wt 127.5 lb

## 2018-02-28 DIAGNOSIS — Z23 Encounter for immunization: Secondary | ICD-10-CM | POA: Diagnosis not present

## 2018-02-28 DIAGNOSIS — I739 Peripheral vascular disease, unspecified: Secondary | ICD-10-CM

## 2018-02-28 DIAGNOSIS — R350 Frequency of micturition: Secondary | ICD-10-CM | POA: Diagnosis not present

## 2018-02-28 DIAGNOSIS — L299 Pruritus, unspecified: Secondary | ICD-10-CM | POA: Diagnosis not present

## 2018-02-28 LAB — POC URINALSYSI DIPSTICK (AUTOMATED)
BILIRUBIN UA: NEGATIVE
Glucose, UA: NEGATIVE
Ketones, UA: NEGATIVE
Leukocytes, UA: NEGATIVE
Nitrite, UA: NEGATIVE
PH UA: 6 (ref 5.0–8.0)
PROTEIN UA: NEGATIVE
RBC UA: NEGATIVE
Spec Grav, UA: 1.025 (ref 1.010–1.025)
UROBILINOGEN UA: 0.2 U/dL

## 2018-02-28 NOTE — Patient Instructions (Addendum)
For dandruff/itchy scalp use selsun blue or head and shoulders or T gel shampoo Use about twice weekly - leave on head about 5 minutes before you rinse   Urine is clear entirely today  If urine symptoms return -please let me know   Use foam under elbows at night to alleviate pressure   Flu shot today

## 2018-02-28 NOTE — Progress Notes (Signed)
Subjective:    Patient ID: Tracy Morales, female    DOB: 1919/08/22, 84 y.o.   MRN: [id number]  HPI Here for urinary symptoms   Also occ itchy scalp  Thinks she has dandruff   Elbows hurt at night  ? Pressure from mattress    Frequent need to urinate  2 nights ago - got up to urinate 6 times during the night and she would urinate a lot  She self caths  Got a lot of volume then  Did not drink more than usual that day  No pain  No blood that she could tell-but it was dark No fever or nausea  No bladder pain   This did not continue   Does not like a lot of water Eats watermelon and drinks lemonade   Results for orders placed or performed in visit on 02/28/18  POCT Urinalysis Dipstick (Automated)  Result Value Ref Range   Color, UA yellow    Clarity, UA clear    Glucose, UA Negative Negative   Bilirubin, UA neg    Ketones, UA neg    Spec Grav, UA 1.025 1.010 - 1.025   Blood, UA neg    pH, UA 6.0 5.0 - 8.0   Protein, UA Negative Negative   Urobilinogen, UA 0.2 0.2 or 1.0 E.U./dL   Nitrite, UA neg    Leukocytes, UA Negative Negative     Takes cranberry pills  UA is clear today   Pt has CKD Sees renal Lab Results  Component Value Date   CREATININE 1.56 (H) 05/02/2017   BUN 35 (H) 05/02/2017   NA 138 05/02/2017   K 4.1 05/02/2017   CL 104 05/02/2017   CO2 27 05/02/2017   Patient Active Problem List   Diagnosis Date Noted  . Frequent urination 02/28/2018  . Mobility impaired 05/06/2017  . Osteoarthritis of both hips 04/15/2015  . Hip pain 04/14/2015  . Routine general medical examination at a health care facility 04/12/2015  . Hair loss 11/19/2014  . Loss of weight 06/09/2014  . Arthritis of right shoulder region 06/09/2014  . Nodule of left lung 06/09/2014  . Encounter for Medicare annual wellness exam 10/30/2013  . H/O fall 04/27/2013  . Risk for falls 04/11/2012  . Leg pain, bilateral 01/29/2012  . MICROSCOPIC HEMATURIA 08/29/2009  .  CONDUCTIVE HEARING LOSS BILATERAL 08/08/2009  . EDEMA 04/07/2009  . BRADYCARDIA 10/26/2008  . DYSPNEA 10/26/2008  . INCONTINENCE, URGE 02/27/2008  . Hyperlipidemia 10/11/2006  . PERIPHERAL NEUROPATHY, LOWER EXTREMITIES, BILATERAL 10/11/2006  . Essential hypertension 10/11/2006  . DIASTOLIC DYSFUNCTION 10/11/2006  . CAROTID STENOSIS 10/11/2006  . CEREBROVASCULAR DISEASE 10/11/2006  . PERIPHERAL VASCULAR DISEASE 10/11/2006  . ALLERGIC RHINITIS 10/11/2006  . Chronic kidney disease 10/11/2006  . OVERACTIVE BLADDER 10/11/2006  . OSTEOARTHRITIS 10/11/2006  . POLYMYALGIA RHEUMATICA, HX OF 10/11/2006  . CARDIAC MURMUR, HX OF 10/11/2006   Past Medical History:  Diagnosis Date  . Allergic rhinitis, cause unspecified   . Bradycardia   . Cerebrovascular disease, unspecified   . Chronic kidney disease, unspecified    chronic renal insufficiency  . Dizziness and giddiness   . DVT (deep venous thrombosis) (HCC)    R  . Dyspnea    echo with normal EF. +diastolic dysfunction  . 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 HTN (hypertension)   . Hyperlipidemia   . Hypertonicity of bladder    hx  . Mild depression (HCC)   . Mononeuritis  of lower limb, unspecified    peripheral neuropathy, bilateral  . Occlusion and stenosis of carotid artery without mention of cerebral infarction 4/09   u/s L 40-59%, R 0-39%; stable for many years  . Osteoarthrosis, unspecified whether generalized or localized, unspecified site   . Peripheral vascular disease, unspecified (HCC)   . Polymyalgia rheumatica (HCC)    hx  . RUQ pain   . Urge incontinence    Past Surgical History:  Procedure Laterality Date  . 2D echo  8/06   mild aortic stenosis.   Marland Kitchen arterial doppler    . carotid doppler  4/02   bilateral plaque   . carotid doppler  8/06   non significant stenosis  . carotid doppler     R 40%; L 40-60%  . CATARACT EXTRACTION    . CHOLECYSTECTOMY  2000  . COLONOSCOPY  1/04   polyps  . CT SCAN   05/25/08   abd and pelvis- inflated urinary bladder and large amt stool throughout w/o blockage   . dexa  2/01   osteopenia  . echo     mild LVH EF 50% As  . triger finger contracture  6/04  . vert. basilar insufficiency  2002  . zoster  5/02   Social History   Tobacco Use  . Smoking status: Never Smoker  . Smokeless tobacco: Never Used  . Tobacco comment: non smoker   Substance Use Topics  . Alcohol use: No    Alcohol/week: 0.0 standard drinks  . Drug use: No   Family History  Problem Relation Age of Onset  . Hypertension Father   . Other Father        Cardiac problems  . Heart disease Father   . Other Brother        blood clot  . Other Sister        benign breast nodule   Allergies  Allergen Reactions  . Ace Inhibitors     REACTION: cough   Current Outpatient Medications on File Prior to Visit  Medication Sig Dispense Refill  . acetaminophen (TYLENOL) 500 MG tablet Take 500 mg by mouth every 6 (six) hours as needed. OTC-UAD     . amLODipine (NORVASC) 5 MG tablet Take 1 tablet (5 mg total) by mouth daily. 30 tablet 11  . aspirin 325 MG EC tablet Take 325 mg by mouth daily.    . Cholecalciferol (VITAMIN D3) 2000 units capsule Take 2,000 Units by mouth daily.    . ferrous sulfate 325 (65 FE) MG tablet Take 1 tablet (325 mg total) by mouth 2 (two) times daily. 60 tablet 11  . Meclizine HCl (BONINE PO) Take by mouth as needed.    . mirtazapine (REMERON) 30 MG tablet Take 1 tablet (30 mg total) by mouth at bedtime. 30 tablet 11  . Multiple Vitamin (MULTIVITAMIN) tablet Take 1 tablet by mouth daily.    . NON FORMULARY Walker with wheels - for use with ambulation once daily. 429.9, 715.90, 355.9     . psyllium (METAMUCIL) 58.6 % packet Take 1 packet by mouth daily.    . simvastatin (ZOCOR) 20 MG tablet Take one pill by mouth once daily 30 tablet 11   No current facility-administered medications on file prior to visit.     Review of Systems  Constitutional: Negative  for activity change, appetite change, fatigue, fever and unexpected weight change.  HENT: Negative for congestion, ear pain, rhinorrhea, sinus pressure and sore throat.   Eyes: Negative  for pain, redness and visual disturbance.  Respiratory: Negative for cough, shortness of breath and wheezing.   Cardiovascular: Negative for chest pain and palpitations.  Gastrointestinal: Negative for abdominal pain, blood in stool, constipation and diarrhea.  Endocrine: Negative for polydipsia and polyuria.  Genitourinary: Positive for frequency and urgency. Negative for decreased urine volume, dysuria and hematuria.  Musculoskeletal: Negative for arthralgias, back pain and myalgias.  Skin: Negative for pallor and rash.       Itchy scalp  Allergic/Immunologic: Negative for environmental allergies.  Neurological: Negative for dizziness, syncope and headaches.  Hematological: Negative for adenopathy. Does not bruise/bleed easily.  Psychiatric/Behavioral: Negative for decreased concentration and dysphoric mood. The patient is not nervous/anxious.        Objective:   Physical Exam  Constitutional: She appears well-developed and well-nourished. No distress.  Frail appearing elderly female  HENT:  Head: Normocephalic and atraumatic.  Mouth/Throat: Oropharynx is clear and moist.  Eyes: Pupils are equal, round, and reactive to light. Conjunctivae and EOM are normal. No scleral icterus.  Neck: Normal range of motion. Neck supple.  Cardiovascular: Normal rate, regular rhythm and normal heart sounds.  Pulmonary/Chest: Effort normal and breath sounds normal. No stridor. No respiratory distress. She has no wheezes.  Abdominal: Soft. Bowel sounds are normal. She exhibits no distension and no mass. There is no tenderness.  No suprapubic tenderness or fullness  No cva tenderness   Musculoskeletal: She exhibits no edema or deformity.  Lymphadenopathy:    She has no cervical adenopathy.  Neurological: No cranial  nerve deficit. Coordination normal.  Skin: Skin is warm and dry. Capillary refill takes less than 2 seconds.  Mild scaling of scalp  Abrasion/thickedned skin on elbows   Psychiatric: She has a normal mood and affect.          Assessment & Plan:   Problem List Items Addressed This Visit      Cardiovascular and Mediastinum   PERIPHERAL VASCULAR DISEASE    No clinical changes         Musculoskeletal and Integument   Itchy scalp    Suspect seb derm  Enc to use selsun blue shampoo twice weekly and leave on 5 min before rinsing  Update if no imp        Other   Frequent urination    Several nights ago - w/o reduced volume (pt does self cath)  Now resolved UA completely clear  ? If poss early uti and tx with fluids/cranberry pills Continue to follow Enc to keep up fluids in light of CKD       Other Visit Diagnoses    Urinary frequency    -  Primary   Relevant Orders   POCT Urinalysis Dipstick (Automated) (Completed)   Need for influenza vaccination       Relevant Orders   Flu Vaccine QUAD 6+ mos PF IM (Fluarix Quad PF) (Completed)

## 2018-03-01 DIAGNOSIS — L299 Pruritus, unspecified: Secondary | ICD-10-CM | POA: Insufficient documentation

## 2018-03-01 NOTE — Assessment & Plan Note (Signed)
No clinical changes 

## 2018-03-01 NOTE — Assessment & Plan Note (Signed)
Suspect seb derm  Enc to use selsun blue shampoo twice weekly and leave on 5 min before rinsing  Update if no imp

## 2018-03-01 NOTE — Assessment & Plan Note (Signed)
Several nights ago - w/o reduced volume (pt does self cath)  Now resolved UA completely clear  ? If poss early uti and tx with fluids/cranberry pills Continue to follow Enc to keep up fluids in light of CKD

## 2018-03-06 ENCOUNTER — Ambulatory Visit: Payer: Medicare Other

## 2018-05-05 ENCOUNTER — Telehealth: Payer: Self-pay | Admitting: Family Medicine

## 2018-05-05 DIAGNOSIS — I10 Essential (primary) hypertension: Secondary | ICD-10-CM

## 2018-05-05 DIAGNOSIS — N189 Chronic kidney disease, unspecified: Secondary | ICD-10-CM

## 2018-05-05 DIAGNOSIS — E7800 Pure hypercholesterolemia, unspecified: Secondary | ICD-10-CM

## 2018-05-05 NOTE — Telephone Encounter (Signed)
-----   Message from Robert Bellowarlesia R Pinson, LPN sent at 04/54/098111/27/2019  3:15 PM EST ----- Regarding: Labs 12/3 Lab orders needed. Thank you.  [facility]  [facility]

## 2018-05-06 ENCOUNTER — Ambulatory Visit (INDEPENDENT_AMBULATORY_CARE_PROVIDER_SITE_OTHER): Payer: Medicare Other

## 2018-05-06 ENCOUNTER — Ambulatory Visit: Payer: Medicare Other

## 2018-05-06 VITALS — BP 150/84 | HR 73 | Temp 97.5°F | Ht 62.25 in | Wt 129.2 lb

## 2018-05-06 DIAGNOSIS — N189 Chronic kidney disease, unspecified: Secondary | ICD-10-CM

## 2018-05-06 DIAGNOSIS — E7800 Pure hypercholesterolemia, unspecified: Secondary | ICD-10-CM | POA: Diagnosis not present

## 2018-05-06 DIAGNOSIS — Z0000 Encounter for general adult medical examination without abnormal findings: Secondary | ICD-10-CM

## 2018-05-06 DIAGNOSIS — I10 Essential (primary) hypertension: Secondary | ICD-10-CM | POA: Diagnosis not present

## 2018-05-06 LAB — CBC WITH DIFFERENTIAL/PLATELET
BASOS ABS: 0 10*3/uL (ref 0.0–0.1)
BASOS PCT: 1.2 % (ref 0.0–3.0)
EOS ABS: 0.1 10*3/uL (ref 0.0–0.7)
Eosinophils Relative: 3.1 % (ref 0.0–5.0)
HEMATOCRIT: 40.4 % (ref 36.0–46.0)
Hemoglobin: 13.4 g/dL (ref 12.0–15.0)
Lymphocytes Relative: 51.6 % — ABNORMAL HIGH (ref 12.0–46.0)
Lymphs Abs: 2.1 10*3/uL (ref 0.7–4.0)
MCHC: 33.1 g/dL (ref 30.0–36.0)
MCV: 88.5 fl (ref 78.0–100.0)
Monocytes Absolute: 0.6 10*3/uL (ref 0.1–1.0)
Monocytes Relative: 14.6 % — ABNORMAL HIGH (ref 3.0–12.0)
Neutro Abs: 1.2 10*3/uL — ABNORMAL LOW (ref 1.4–7.7)
Neutrophils Relative %: 29.5 % — ABNORMAL LOW (ref 43.0–77.0)
Platelets: 221 10*3/uL (ref 150.0–400.0)
RBC: 4.56 Mil/uL (ref 3.87–5.11)
RDW: 14.9 % (ref 11.5–15.5)
WBC: 4 10*3/uL (ref 4.0–10.5)

## 2018-05-06 LAB — COMPREHENSIVE METABOLIC PANEL
ALBUMIN: 3.8 g/dL (ref 3.5–5.2)
ALK PHOS: 111 U/L (ref 39–117)
ALT: 27 U/L (ref 0–35)
AST: 44 U/L — AB (ref 0–37)
BUN: 30 mg/dL — ABNORMAL HIGH (ref 6–23)
CALCIUM: 9.7 mg/dL (ref 8.4–10.5)
CHLORIDE: 103 meq/L (ref 96–112)
CO2: 28 mEq/L (ref 19–32)
Creatinine, Ser: 1.52 mg/dL — ABNORMAL HIGH (ref 0.40–1.20)
GFR: 40.73 mL/min — ABNORMAL LOW (ref >60.00)
Glucose, Bld: 94 mg/dL (ref 70–99)
Potassium: 4.8 mEq/L (ref 3.5–5.1)
Sodium: 138 mEq/L (ref 135–145)
TOTAL PROTEIN: 7.4 g/dL (ref 6.0–8.3)
Total Bilirubin: 0.4 mg/dL (ref 0.2–1.2)

## 2018-05-06 LAB — LIPID PANEL
CHOLESTEROL: 160 mg/dL (ref 0–200)
HDL: 37.6 mg/dL — ABNORMAL LOW (ref >39.00)
LDL CALC: 107 mg/dL — AB (ref 0–99)
NonHDL: 121.9
TRIGLYCERIDES: 73 mg/dL (ref 0.0–149.0)
Total CHOL/HDL Ratio: 4
VLDL: 14.6 mg/dL (ref 0.0–40.0)

## 2018-05-06 LAB — TSH: TSH: 3.58 u[IU]/mL (ref 0.35–4.50)

## 2018-05-06 NOTE — Patient Instructions (Addendum)
[name] , Thank you for taking time to come for your Medicare Wellness Visit. I appreciate your ongoing commitment to your health goals. Please review the following plan we discussed and let me know if I can assist you in the future.   These are the goals we discussed: Goals    . Follow up with Primary Care Provider     Starting 05/06/2018, I will continue to take medications as prescribed and to follow up with primary care provider as directed.        This is a list of the screening recommended for you and due dates:  Health Maintenance  Topic Date Due  . Urine Protein Check  07/06/2020*  . Tetanus Vaccine  10/10/2021  . Flu Shot  Completed  . DEXA scan (bone density measurement)  Completed  . Pneumonia vaccines  Completed  *Topic was postponed. The date shown is not the original due date.   Preventive Care for Adults  A healthy lifestyle and preventive care can promote health and wellness. Preventive health guidelines for adults include the following key practices.  . A routine yearly physical is a good way to check with your health care provider about your health and preventive screening. It is a chance to share any concerns and updates on your health and to receive a thorough exam.  . Visit your dentist for a routine exam and preventive care every 6 months. Brush your teeth twice a day and floss once a day. Good oral hygiene prevents tooth decay and gum disease.  . The frequency of eye exams is based on your age, health, family medical history, use  of contact lenses, and other factors. Follow your health care provider's recommendations for frequency of eye exams.  . Eat a healthy diet. Foods like vegetables, fruits, whole grains, low-fat dairy products, and lean protein foods contain the nutrients you need without too many calories. Decrease your intake of foods high in solid fats, added sugars, and salt. Eat the right amount of calories for you. Get information about a  proper diet from your health care provider, if necessary.  . Regular physical exercise is one of the most important things you can do for your health. Most adults should get at least 150 minutes of moderate-intensity exercise (any activity that increases your heart rate and causes you to sweat) each week. In addition, most adults need muscle-strengthening exercises on 2 or more days a week.  Silver Sneakers may be a benefit available to you. To determine eligibility, you may visit the website: www.silversneakers.com or contact program at [phone] Mon-Fri between 8AM-8PM.   . Maintain a healthy weight. The body mass index (BMI) is a screening tool to identify possible weight problems. It provides an estimate of body fat based on height and weight. Your health care provider can find your BMI and can help you achieve or maintain a healthy weight.   For adults 20 years and older: ? A BMI below 18.5 is considered underweight. ? A BMI of 18.5 to 24.9 is normal. ? A BMI of 25 to 29.9 is considered overweight. ? A BMI of 30 and above is considered obese.   . Maintain normal blood lipids and cholesterol levels by exercising and minimizing your intake of saturated fat. Eat a balanced diet with plenty of fruit and vegetables. Blood tests for lipids and cholesterol should begin at age 82 and be repeated every 5 years. If your lipid or cholesterol levels are high, you are over  50, or you are at high risk for heart disease, you may need your cholesterol levels checked more frequently. Ongoing high lipid and cholesterol levels should be treated with medicines if diet and exercise are not working.  . If you smoke, find out from your health care provider how to quit. If you do not use tobacco, please do not start.  . If you choose to drink alcohol, please do not consume more than 2 drinks per day. One drink is considered to be 12 ounces (355 mL) of beer, 5 ounces (148 mL) of wine, or 1.5 ounces (44 mL) of  liquor.  . If you are 33-48 years old, ask your health care provider if you should take aspirin to prevent strokes.  . Use sunscreen. Apply sunscreen liberally and repeatedly throughout the day. You should seek shade when your shadow is shorter than you. Protect yourself by wearing long sleeves, pants, a wide-brimmed hat, and sunglasses year round, whenever you are outdoors.  . Once a month, do a whole body skin exam, using a mirror to look at the skin on your back. Tell your health care provider of new moles, moles that have irregular borders, moles that are larger than a pencil eraser, or moles that have changed in shape or color.

## 2018-05-06 NOTE — Progress Notes (Signed)
Subjective:   Tracy Morales is a 84 y.o. female who presents for Medicare Annual (Subsequent) preventive examination.  Review of Systems:  N/A Cardiac Risk Factors include: advanced age (>33men, >49 women);dyslipidemia;hypertension     Objective:     Vitals: BP (!) 150/84 (BP Location: Right Arm, Patient Position: Sitting, Cuff Size: Normal)   Pulse 73   Temp (!) 97.5 F (36.4 C) (Oral)   Ht 5' 2.25" (1.581 m) Comment: shoes  Wt 129 lb 4 oz (58.6 kg)   SpO2 97%   BMI 23.45 kg/m   Body mass index is 23.45 kg/m.  Advanced Directives 05/06/2018 05/02/2017 11/07/2016 04/19/2016  Does Patient Have a Medical Advance Directive? No No No Yes  Type of Advance Directive - - Web designer;Living will  Does patient want to make changes to medical advance directive? - - - No - Patient declined  Copy of Healthcare Power of Attorney in Chart? - - - No - copy requested  Would patient like information on creating a medical advance directive? No - Patient declined - - -    Tobacco Social History   Tobacco Use  Smoking Status Never Smoker  Smokeless Tobacco Never Used  Tobacco Comment   non smoker      Counseling given: No Comment: non smoker    Clinical Intake:  Pre-visit preparation completed: Yes  Pain : No/denies pain Pain Score: 0-No pain     Nutritional Status: BMI of 19-24  Normal Nutritional Risks: None Diabetes: No  How often do you need to have someone help you when you read instructions, pamphlets, or other written materials from your doctor or pharmacy?: 3 - Sometimes What is the last grade level you completed in school?: 11th grade (last grade available at school)  Interpreter Needed?: No  Comments: pt and grandson live together Information entered by :: LPinson, LPN  Past Medical History:  Diagnosis Date  . Allergic rhinitis, cause unspecified   . Bradycardia   . Cerebrovascular disease, unspecified   . Chronic kidney disease,  unspecified    chronic renal insufficiency  . Dizziness and giddiness   . DVT (deep venous thrombosis) (HCC)    R  . Dyspnea    echo with normal EF. +diastolic dysfunction  . 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 HTN (hypertension)   . Hyperlipidemia   . Hypertonicity of bladder    hx  . Mild depression (HCC)   . Mononeuritis of lower limb, unspecified    peripheral neuropathy, bilateral  . Occlusion and stenosis of carotid artery without mention of cerebral infarction 4/09   u/s L 40-59%, R 0-39%; stable for many years  . Osteoarthrosis, unspecified whether generalized or localized, unspecified site   . Peripheral vascular disease, unspecified (HCC)   . Polymyalgia rheumatica (HCC)    hx  . RUQ pain   . Urge incontinence    Past Surgical History:  Procedure Laterality Date  . 2D echo  8/06   mild aortic stenosis.   Marland Kitchen arterial doppler    . carotid doppler  4/02   bilateral plaque   . carotid doppler  8/06   non significant stenosis  . carotid doppler     R 40%; L 40-60%  . CATARACT EXTRACTION    . CHOLECYSTECTOMY  2000  . COLONOSCOPY  1/04   polyps  . CT SCAN  05/25/08   abd and pelvis- inflated urinary bladder and large amt stool throughout  w/o blockage   . dexa  2/01   osteopenia  . echo     mild LVH EF 50% As  . triger finger contracture  6/04  . vert. basilar insufficiency  2002  . zoster  5/02   Family History  Problem Relation Age of Onset  . Hypertension Father   . Other Father        Cardiac problems  . Heart disease Father   . Other Brother        blood clot  . Other Sister        benign breast nodule   Social History   Socioeconomic History  . Marital status: Widowed    Spouse name: Not on file  . Number of children: 2  . Years of education: Not on file  . Highest education level: Not on file  Occupational History  . Occupation: Retired  Engineer, production  . Financial resource strain: Not on file  . Food insecurity:    Worry: Not  on file    Inability: Not on file  . Transportation needs:    Medical: Not on file    Non-medical: Not on file  Tobacco Use  . Smoking status: Never Smoker  . Smokeless tobacco: Never Used  . Tobacco comment: non smoker   Substance and Sexual Activity  . Alcohol use: No    Alcohol/week: 0.0 standard drinks  . Drug use: No  . Sexual activity: Never  Lifestyle  . Physical activity:    Days per week: Not on file    Minutes per session: Not on file  . Stress: Not on file  Relationships  . Social connections:    Talks on phone: Not on file    Gets together: Not on file    Attends religious service: Not on file    Active member of club or organization: Not on file    Attends meetings of clubs or organizations: Not on file    Relationship status: Not on file  Other Topics Concern  . Not on file  Social History Narrative   Widowed. 2 children. Retired, gets regular exercise.     Outpatient Encounter Medications as of 05/06/2018  Medication Sig  . acetaminophen (TYLENOL) 500 MG tablet Take 500 mg by mouth every 6 (six) hours as needed. OTC-UAD   . amLODipine (NORVASC) 5 MG tablet Take 1 tablet (5 mg total) by mouth daily.  Marland Kitchen aspirin 325 MG EC tablet Take 325 mg by mouth daily.  . Cholecalciferol (VITAMIN D3) 2000 units capsule Take 2,000 Units by mouth daily.  . ferrous sulfate 325 (65 FE) MG tablet Take 1 tablet (325 mg total) by mouth 2 (two) times daily.  . Meclizine HCl (BONINE PO) Take by mouth as needed.  . mirtazapine (REMERON) 30 MG tablet Take 1 tablet (30 mg total) by mouth at bedtime.  . Multiple Vitamin (MULTIVITAMIN) tablet Take 1 tablet by mouth daily.  . NON FORMULARY Walker with wheels - for use with ambulation once daily. 429.9, 715.90, 355.9   . psyllium (METAMUCIL) 58.6 % packet Take 1 packet by mouth daily.  . simvastatin (ZOCOR) 20 MG tablet Take one pill by mouth once daily   No facility-administered encounter medications on file as of 05/06/2018.      Activities of Daily Living In your present state of health, do you have any difficulty performing the following activities: 05/06/2018  Hearing? Y  Vision? N  Difficulty concentrating or making decisions? Y  Walking or  climbing stairs? Y  Dressing or bathing? N  Doing errands, shopping? Y  Preparing Food and eating ? Y  Using the Toilet? N  In the past six months, have you accidently leaked urine? N  Do you have problems with loss of bowel control? N  Managing your Medications? Y  Managing your Finances? Y  Housekeeping or managing your Housekeeping? Y  Some recent data might be hidden    Patient Care Team: Tower, Audrie GallusMarne A, MD as PCP - [facility] PromiseBulakowski, [place], [place] as Consulting Physician (Optometry) Mosetta PigeonSingh, Harmeet, MD as Consulting Physician (Internal Medicine) Orson ApeWolff, Michael R, MD as Consulting Physician (Urology) Elinor ParkinsonHyatt, Max T, [place] as Consulting Physician (Podiatry) Fransico MichaelPelletier, Sarah C, MD as Referring Physician (Audiology)    Assessment:   This is a routine wellness examination for BoomerAnnie.   Hearing Screening   125Hz  250Hz  500Hz  1000Hz  2000Hz  3000Hz  4000Hz  6000Hz  8000Hz   Right ear:           Left ear:   0 0 0  0    Vision Screening Comments: Hearing aid - right ear only   Exercise Activities and Dietary recommendations Current Exercise Habits: The patient does not participate in regular exercise at present, Exercise limited by: None identified  Goals    . Follow up with Primary Care Provider     Starting 05/06/2018, I will continue to take medications as prescribed and to follow up with primary care provider as directed.        Fall Risk Fall Risk  05/06/2018 05/02/2017 04/19/2016 04/17/2016 04/12/2015  Falls in the past year? 1 No Yes Yes Yes  Comment - - per pt, "legs gave way and I fell backwards" - -  Number falls in past yr: 1 - 1 1 1   Injury with Fall? 1 - Yes No No  Risk Factor Category  - - - - -  Risk for fall due to : Impaired balance/gait;Impaired  mobility - - - -  Follow up - - Falls evaluation completed - -   Depression Screen PHQ 2/9 Scores 05/06/2018 05/02/2017 04/19/2016 04/17/2016  PHQ - 2 Score 1 3 0 0  PHQ- 9 Score 1 15 - -     Cognitive Function MMSE - Mini Mental State Exam 05/06/2018 05/02/2017 04/19/2016  Orientation to time 5 5 5   Orientation to Place 5 5 5   Registration 3 3 3   Attention/ Calculation 0 0 0  Recall 1 1 3   Recall-comments unable to recall 2 of 3 words; was able to recall after cues given unable  to recall 2 of 3 words -  Language- name 2 objects 0 0 0  Language- repeat 1 1 1   Language- follow 3 step command 3 3 3   Language- read & follow direction 0 0 0  Write a sentence 0 0 0  Copy design 0 0 0  Total score 18 18 20        PLEASE NOTE: A Mini-Cog screen was completed. Maximum score is 20. A value of 0 denotes this part of Folstein MMSE was not completed or the patient failed this part of the Mini-Cog screening.   Mini-Cog Screening Orientation to Time - Max 5 pts Orientation to Place - Max 5 pts Registration - Max 3 pts Recall - Max 3 pts Language Repeat - Max 1 pts Language Follow 3 Step Command - Max 3 pts   Immunization History  Administered Date(s) Administered  . Influenza Split 03/08/2011, 02/25/2012  . Influenza Whole 05/05/2004, 02/27/2008, 03/03/2009,  02/24/2010  . Influenza,inj,Quad PF,6+ Mos 02/25/2013, 02/01/2014, 03/10/2015, 03/05/2016, 02/19/2017, 02/28/2018  . PPD Test 06/09/2014  . Pneumococcal Conjugate-13 10/30/2013  . Pneumococcal Polysaccharide-23 05/06/2002  . Td 05/06/2002  . Tdap 10/11/2011    Screening Tests Health Maintenance  Topic Date Due  . URINE MICROALBUMIN  07/06/2020 (Originally 09/19/1929)  . TETANUS/TDAP  10/10/2021  . INFLUENZA VACCINE  Completed  . DEXA SCAN  Completed  . PNA vac Low Risk Adult  Completed      Plan:     I have personally reviewed, addressed, and noted the following in the patient's chart:  A. Medical and social  history B. Use of alcohol, tobacco or illicit drugs  C. Current medications and supplements D. Functional ability and status E.  Nutritional status F.  Physical activity G. Advance directives H. List of other physicians I.  Hospitalizations, surgeries, and ER visits in previous 12 months J.  Vitals K. Screenings to include hearing, vision, cognitive, depression L. Referrals and appointments - none  In addition, I have reviewed and discussed with patient certain preventive protocols, quality metrics, and best practice recommendations. A written personalized care plan for preventive services as well as general preventive health recommendations were provided to patient.  See attached scanned questionnaire for additional information.   Signed,   Randa Evens, MHA, BS, LPN Health Coach

## 2018-05-06 NOTE — Progress Notes (Signed)
PCP notes:   Health maintenance:  No gaps identified.  Abnormal screenings:   Hearing - failed  Hearing Screening   125Hz  250Hz  500Hz  1000Hz  2000Hz  3000Hz  4000Hz  6000Hz  8000Hz   Right ear:           Left ear:   0 0 0  0    Hearing Screening Comments: Hearing aid - right ear only   Depression score: 1 Depression screen [facility] 05/06/2018 05/02/2017 04/19/2016 04/17/2016 04/12/2015  Decreased Interest 0 1 0 0 0  Down, Depressed, Hopeless 1 2 0 0 0  PHQ - 2 Score 1 3 0 0 0  Altered sleeping 0 1 - - -  Tired, decreased energy 0 3 - - -  Change in appetite 0 2 - - -  Feeling bad or failure about yourself  0 1 - - -  Trouble concentrating 0 2 - - -  Moving slowly or fidgety/restless 0 3 - - -  Suicidal thoughts 0 0 - - -  PHQ-9 Score 1 15 - - -  Difficult doing work/chores Not difficult at all (No Data) - - -  Some recent data might be hidden    Mini-Cog score: 18/20 MMSE - Mini Mental State Exam 05/06/2018 05/02/2017 04/19/2016  Orientation to time 5 5 5   Orientation to Place 5 5 5   Registration 3 3 3   Attention/ Calculation 0 0 0  Recall 1 1 3   Recall-comments unable to recall 2 of 3 words; was able to recall after cues given unable  to recall 2 of 3 words -  Language- name 2 objects 0 0 0  Language- repeat 1 1 1   Language- follow 3 step command 3 3 3   Language- read & follow direction 0 0 0  Write a sentence 0 0 0  Copy design 0 0 0  Total score 18 18 20       Fall risk - hx of multiple falls Fall Risk  05/06/2018 05/02/2017 04/19/2016 04/17/2016 04/12/2015  Falls in the past year? 1 No Yes Yes Yes  Comment - - per pt, "legs gave way and I fell backwards" - -  Number falls in past yr: 1 - 1 1 1   Injury with Fall? 1 - Yes No No  Risk Factor Category  - - - - -  Risk for fall due to : Impaired balance/gait;Impaired mobility - - - -  Follow up - - Falls evaluation completed - -    Patient concerns:   None  Nurse concerns:  None  Next PCP appt:   05/09/18 @  1130  I reviewed health advisor's note, was available for consultation, and agree with documentation and plan. Roxy MannsMarne Tower MD

## 2018-05-09 ENCOUNTER — Ambulatory Visit (INDEPENDENT_AMBULATORY_CARE_PROVIDER_SITE_OTHER): Payer: Medicare Other | Admitting: Family Medicine

## 2018-05-09 ENCOUNTER — Encounter: Payer: Self-pay | Admitting: Family Medicine

## 2018-05-09 VITALS — BP 134/74 | HR 69 | Temp 97.8°F | Ht 62.25 in | Wt 127.5 lb

## 2018-05-09 DIAGNOSIS — R634 Abnormal weight loss: Secondary | ICD-10-CM

## 2018-05-09 DIAGNOSIS — Z0000 Encounter for general adult medical examination without abnormal findings: Secondary | ICD-10-CM

## 2018-05-09 DIAGNOSIS — I10 Essential (primary) hypertension: Secondary | ICD-10-CM

## 2018-05-09 DIAGNOSIS — Z9181 History of falling: Secondary | ICD-10-CM

## 2018-05-09 DIAGNOSIS — L299 Pruritus, unspecified: Secondary | ICD-10-CM

## 2018-05-09 DIAGNOSIS — N189 Chronic kidney disease, unspecified: Secondary | ICD-10-CM

## 2018-05-09 DIAGNOSIS — E7800 Pure hypercholesterolemia, unspecified: Secondary | ICD-10-CM | POA: Diagnosis not present

## 2018-05-09 DIAGNOSIS — G3184 Mild cognitive impairment, so stated: Secondary | ICD-10-CM

## 2018-05-09 MED ORDER — MIRTAZAPINE 30 MG PO TABS: 30.0000 mg | ORAL_TABLET | Freq: Every day | ORAL | 11 refills | Status: DC

## 2018-05-09 MED ORDER — SIMVASTATIN 20 MG PO TABS: ORAL_TABLET | ORAL | 11 refills | Status: DC

## 2018-05-09 MED ORDER — FERROUS SULFATE 325 (65 FE) MG PO TABS: 325.0000 mg | ORAL_TABLET | Freq: Two times a day (BID) | ORAL | 11 refills | Status: DC

## 2018-05-09 MED ORDER — AMLODIPINE BESYLATE 5 MG PO TABS: 5.0000 mg | ORAL_TABLET | Freq: Every day | ORAL | 11 refills | Status: DC

## 2018-05-09 NOTE — Progress Notes (Signed)
Subjective:    Patient ID: Tracy Morales, female    DOB: 07-30-1919, 84 y.o.   MRN: [id number]  HPI  Here for health maintenance exam and to review chronic medical problems    Has had trouble with dandruff - uses a special shampoo twice weekly (T gel)    Wt Readings from Last 3 Encounters:  05/09/18 127 lb 8 oz (57.8 kg)  05/06/18 129 lb 4 oz (58.6 kg)  02/28/18 127 lb 8 oz (57.8 kg)  not eating much  Does not eat 3 meals per day  She eats when she wants to - some breakfast and occ in evening  23.13 kg/m   Had amw on 12/3 Failed hearing screen in L  Wears hearing aide in the R Depression score is 1  Mini cog 18/20- missed 2 of 3 word recall   (same as a year ago) Family notices worse short term memory is worse over time  Now using a board to write things down  Does use a days of the week pill box Family reminds her a lot   PT helped decrease falls  Less this year Uses a walker   Likes to cook -family worried about leaving stove on /etc   Noted one fall this year   dexa 2001 Declines  Taking vitamin D and ca  Does walk in the house   No longer getting mammograms No lumps or pain on self exam   Zoster status -has shingles in the past Is on wait list at pharmacy for vaccine   bp is stable today  No cp or palpitations or headaches or edema  No side effects to medicines  BP Readings from Last 3 Encounters:  05/09/18 134/74  05/06/18 (!) 150/84  02/28/18 132/82      CKD-saw renal-released her  We monitor now  Lab Results  Component Value Date   CREATININE 1.52 (H) 05/06/2018   BUN 30 (H) 05/06/2018   NA 138 05/06/2018   K 4.8 05/06/2018   CL 103 05/06/2018   CO2 28 05/06/2018  trying to drink enough fluid    Hyperlipidemia Lab Results  Component Value Date   CHOL 160 05/06/2018   CHOL 157 05/02/2017   CHOL 159 04/10/2016   Lab Results  Component Value Date   HDL 37.60 (L) 05/06/2018   HDL 35.50 (L) 05/02/2017   HDL 34.70 (L) 04/10/2016     Lab Results  Component Value Date   LDLCALC 107 (H) 05/06/2018   LDLCALC 109 (H) 05/02/2017   LDLCALC 105 (H) 04/10/2016   Lab Results  Component Value Date   TRIG 73.0 05/06/2018   TRIG 62.0 05/02/2017   TRIG 95.0 04/10/2016   Lab Results  Component Value Date   CHOLHDL 4 05/06/2018   CHOLHDL 4 05/02/2017   CHOLHDL 5 04/10/2016   No results found for: LDLDIRECT On simvastatin - fairly stable   Legs hurt off and on  Massaging cream helps a lot  Also tries to stay warm   Lab Results  Component Value Date   ALT 27 05/06/2018   AST 44 (H) 05/06/2018   ALKPHOS 111 05/06/2018   BILITOT 0.4 05/06/2018   Lab Results  Component Value Date   WBC 4.0 05/06/2018   HGB 13.4 05/06/2018   HCT 40.4 05/06/2018   MCV 88.5 05/06/2018   PLT 221.0 05/06/2018   Lab Results  Component Value Date   TSH 3.58 05/06/2018    Patient Active Problem List  Diagnosis Date Noted  . Itchy scalp 03/01/2018  . Frequent urination 02/28/2018  . Mobility impaired 05/06/2017  . Osteoarthritis of both hips 04/15/2015  . Hip pain 04/14/2015  . Routine general medical examination at a health care facility 04/12/2015  . Hair loss 11/19/2014  . Loss of weight 06/09/2014  . Arthritis of right shoulder region 06/09/2014  . Nodule of left lung 06/09/2014  . Encounter for Medicare annual wellness exam 10/30/2013  . H/O fall 04/27/2013  . Risk for falls 04/11/2012  . Leg pain, bilateral 01/29/2012  . MICROSCOPIC HEMATURIA 08/29/2009  . CONDUCTIVE HEARING LOSS BILATERAL 08/08/2009  . EDEMA 04/07/2009  . BRADYCARDIA 10/26/2008  . DYSPNEA 10/26/2008  . INCONTINENCE, URGE 02/27/2008  . Hyperlipidemia 10/11/2006  . PERIPHERAL NEUROPATHY, LOWER EXTREMITIES, BILATERAL 10/11/2006  . Essential hypertension 10/11/2006  . DIASTOLIC DYSFUNCTION 10/11/2006  . CAROTID STENOSIS 10/11/2006  . CEREBROVASCULAR DISEASE 10/11/2006  . PERIPHERAL VASCULAR DISEASE 10/11/2006  . ALLERGIC RHINITIS 10/11/2006  .  Chronic kidney disease 10/11/2006  . OVERACTIVE BLADDER 10/11/2006  . OSTEOARTHRITIS 10/11/2006  . CARDIAC MURMUR, HX OF 10/11/2006   Past Medical History:  Diagnosis Date  . Allergic rhinitis, cause unspecified   . Bradycardia   . Cerebrovascular disease, unspecified   . Chronic kidney disease, unspecified    chronic renal insufficiency  . Dizziness and giddiness   . DVT (deep venous thrombosis) (HCC)    R  . Dyspnea    echo with normal EF. +diastolic dysfunction  . 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 HTN (hypertension)   . Hyperlipidemia   . Hypertonicity of bladder    hx  . Mild depression (HCC)   . Mononeuritis of lower limb, unspecified    peripheral neuropathy, bilateral  . Occlusion and stenosis of carotid artery without mention of cerebral infarction 4/09   u/s L 40-59%, R 0-39%; stable for many years  . Osteoarthrosis, unspecified whether generalized or localized, unspecified site   . Peripheral vascular disease, unspecified (HCC)   . Polymyalgia rheumatica (HCC)    hx  . RUQ pain   . Urge incontinence    Past Surgical History:  Procedure Laterality Date  . 2D echo  8/06   mild aortic stenosis.   Marland Kitchen arterial doppler    . carotid doppler  4/02   bilateral plaque   . carotid doppler  8/06   non significant stenosis  . carotid doppler     R 40%; L 40-60%  . CATARACT EXTRACTION    . CHOLECYSTECTOMY  2000  . COLONOSCOPY  1/04   polyps  . CT SCAN  05/25/08   abd and pelvis- inflated urinary bladder and large amt stool throughout w/o blockage   . dexa  2/01   osteopenia  . echo     mild LVH EF 50% As  . triger finger contracture  6/04  . vert. basilar insufficiency  2002  . zoster  5/02   Social History   Tobacco Use  . Smoking status: Never Smoker  . Smokeless tobacco: Never Used  . Tobacco comment: non smoker   Substance Use Topics  . Alcohol use: No    Alcohol/week: 0.0 standard drinks  . Drug use: No   Family History  Problem  Relation Age of Onset  . Hypertension Father   . Other Father        Cardiac problems  . Heart disease Father   . Other Brother  blood clot  . Other Sister        benign breast nodule   Allergies  Allergen Reactions  . Ace Inhibitors     REACTION: cough   Current Outpatient Medications on File Prior to Visit  Medication Sig Dispense Refill  . acetaminophen (TYLENOL) 500 MG tablet Take 500 mg by mouth every 6 (six) hours as needed. OTC-UAD     . aspirin 325 MG EC tablet Take 325 mg by mouth daily.    . Cholecalciferol (VITAMIN D3) 2000 units capsule Take 2,000 Units by mouth daily.    . Meclizine HCl (BONINE PO) Take by mouth as needed.    . Multiple Vitamin (MULTIVITAMIN) tablet Take 1 tablet by mouth daily.    . NON FORMULARY Walker with wheels - for use with ambulation once daily. 429.9, 715.90, 355.9     . psyllium (METAMUCIL) 58.6 % packet Take 1 packet by mouth daily.     No current facility-administered medications on file prior to visit.     Review of Systems  Constitutional: Positive for fatigue. Negative for activity change, appetite change, fever and unexpected weight change.  HENT: Negative for congestion, ear pain, rhinorrhea, sinus pressure and sore throat.   Eyes: Negative for pain, redness and visual disturbance.  Respiratory: Negative for cough, shortness of breath and wheezing.   Cardiovascular: Negative for chest pain and palpitations.  Gastrointestinal: Negative for abdominal pain, blood in stool, constipation and diarrhea.  Endocrine: Negative for polydipsia and polyuria.  Genitourinary: Negative for dysuria, frequency and urgency.  Musculoskeletal: Positive for arthralgias, back pain and gait problem. Negative for joint swelling and myalgias.  Skin: Negative for pallor and rash.  Allergic/Immunologic: Negative for environmental allergies.  Neurological: Negative for dizziness, syncope and headaches.  Hematological: Negative for adenopathy. Does not  bruise/bleed easily.  Psychiatric/Behavioral: Positive for decreased concentration. Negative for dysphoric mood. The patient is not nervous/anxious.        Objective:   Physical Exam  Constitutional: She appears well-developed and well-nourished. No distress.  Frail appearing elderly female   HENT:  Head: Normocephalic and atraumatic.  Right Ear: External ear normal.  Left Ear: External ear normal.  Mouth/Throat: Oropharynx is clear and moist.  Eyes: Pupils are equal, round, and reactive to light. Conjunctivae and EOM are normal. No scleral icterus.  Neck: Normal range of motion. Neck supple. No JVD present. Carotid bruit is not present. No thyromegaly present.  Cardiovascular: Normal rate, regular rhythm, normal heart sounds and intact distal pulses. Exam reveals no gallop.  Pulmonary/Chest: Effort normal and breath sounds normal. No stridor. No respiratory distress. She has no wheezes. She has no rales. She exhibits no tenderness. No breast tenderness, discharge or bleeding.  Abdominal: Soft. Bowel sounds are normal. She exhibits no distension, no abdominal bruit and no mass. There is no tenderness.  Genitourinary: No breast tenderness, discharge or bleeding.  Genitourinary Comments:      Musculoskeletal: Normal range of motion. She exhibits no edema or tenderness.  Lymphadenopathy:    She has no cervical adenopathy.  Neurological: She is alert. She has normal reflexes. She displays normal reflexes. No cranial nerve deficit. She exhibits normal muscle tone. Coordination normal.  Skin: Skin is warm and dry. No rash noted. No erythema. No pallor.  Some lentigines and sks   Psychiatric: She has a normal mood and affect. Cognition and memory are impaired. She exhibits abnormal recent memory.  Pleasant  Mentally sharp during visit  Assessment & Plan:   Problem List Items Addressed This Visit      Cardiovascular and Mediastinum   Essential hypertension    bp in fair  control at this time  BP Readings from Last 1 Encounters:  05/09/18 134/74   No changes needed Most recent labs reviewed  Disc lifstyle change with low sodium diet and exercise        Relevant Medications   amLODipine (NORVASC) 5 MG tablet   simvastatin (ZOCOR) 20 MG tablet     Musculoskeletal and Integument   Itchy scalp    seb derm T gel shampoo is helping         Genitourinary   Chronic kidney disease    Lab Results  Component Value Date   CREATININE 1.52 (H) 05/06/2018   No changes  Enc fluid intake and avoidance of nephrotoxins Saw renal in the past- can return if worse  We monitor ofr now         Other   Routine general medical examination at a health care facility - Primary    Reviewed health habits including diet and exercise and skin cancer prevention Reviewed appropriate screening tests for age  Also reviewed health mt list, fam hx and immunization status , as well as social and family history   See HPI Labs rev amw rev  Rev safety with MCI and fall risk  On wait list for shingrix Declines dexa  Declines breast cancer screen due to age  Safety discussed       Risk for falls    Again rev risk One fall this year  Uses walker at all times Family ensures house is safe  Disc prev falls w/in the home  PT did help significantly       MCI (mild cognitive impairment)    As expected for age Overall stable Safety issues disc with pt and family  Is able to self dose medications with pill box      Loss of weight    Poor appetite and small po intake Family continues to que and enc meals and protein  Disc use of meal supplement shakes  Pt does not desire work up       Hyperlipidemia    Disc goals for lipids and reasons to control them Rev last labs with pt Rev low sat fat diet in detail  Stable with simvastatin       Relevant Medications   amLODipine (NORVASC) 5 MG tablet   simvastatin (ZOCOR) 20 MG tablet

## 2018-05-09 NOTE — Patient Instructions (Addendum)
Try get calories in the middle of the day  Try ensure shake if you don't want to eat more  To keep your strength - eat when not hungry at times   Make a calendar board  Also reminders /notes  No cooking with heat unless someone is around  Reminders for drinking fluids    Labs are stable Keep using your walker to prevent falls

## 2018-05-11 DIAGNOSIS — F0390 Unspecified dementia without behavioral disturbance: Secondary | ICD-10-CM | POA: Insufficient documentation

## 2018-05-11 DIAGNOSIS — G3184 Mild cognitive impairment, so stated: Secondary | ICD-10-CM | POA: Insufficient documentation

## 2018-05-11 NOTE — Assessment & Plan Note (Signed)
bp in fair control at this time  BP Readings from Last 1 Encounters:  05/09/18 134/74   No changes needed Most recent labs reviewed  Disc lifstyle change with low sodium diet and exercise

## 2018-05-11 NOTE — Assessment & Plan Note (Signed)
Reviewed health habits including diet and exercise and skin cancer prevention Reviewed appropriate screening tests for age  Also reviewed health mt list, fam hx and immunization status , as well as social and family history   See HPI Labs rev amw rev  Rev safety with MCI and fall risk  On wait list for shingrix Declines dexa  Declines breast cancer screen due to age  Safety discussed

## 2018-05-11 NOTE — Assessment & Plan Note (Signed)
Lab Results  Component Value Date   CREATININE 1.52 (H) 05/06/2018   No changes  Enc fluid intake and avoidance of nephrotoxins Saw renal in the past- can return if worse  We monitor ofr now

## 2018-05-11 NOTE — Assessment & Plan Note (Addendum)
Again rev risk One fall this year  Uses walker at all times Family ensures house is safe  Disc prev falls w/in the home  PT did help significantly

## 2018-05-11 NOTE — Assessment & Plan Note (Signed)
As expected for age Overall stable Safety issues disc with pt and family  Is able to self dose medications with pill box

## 2018-05-11 NOTE — Assessment & Plan Note (Signed)
Poor appetite and small po intake Family continues to que and enc meals and protein  Disc use of meal supplement shakes  Pt does not desire work up

## 2018-05-11 NOTE — Assessment & Plan Note (Signed)
Disc goals for lipids and reasons to control them Rev last labs with pt Rev low sat fat diet in detail Stable with simvastatin    

## 2018-05-11 NOTE — Assessment & Plan Note (Signed)
seb derm T gel shampoo is helping

## 2018-05-19 ENCOUNTER — Ambulatory Visit: Payer: Medicare Other | Admitting: Podiatry

## 2018-05-19 ENCOUNTER — Encounter: Payer: Self-pay | Admitting: Podiatry

## 2018-05-19 DIAGNOSIS — M79674 Pain in right toe(s): Secondary | ICD-10-CM

## 2018-05-19 DIAGNOSIS — M79675 Pain in left toe(s): Secondary | ICD-10-CM | POA: Diagnosis not present

## 2018-05-19 DIAGNOSIS — E1149 Type 2 diabetes mellitus with other diabetic neurological complication: Secondary | ICD-10-CM | POA: Diagnosis not present

## 2018-05-19 DIAGNOSIS — B351 Tinea unguium: Secondary | ICD-10-CM

## 2018-05-19 NOTE — Progress Notes (Signed)
Complaint:  Visit Type: Patient returns to my office for continued preventative foot care services. Complaint: Patient states" my nails have grown long and thick and become painful to walk and wear shoes" Patient has been diagnosed with DM with no foot complications. The patient presents for preventative foot care services. No changes to ROS  Podiatric Exam: Vascular: dorsalis pedis and posterior tibial pulses are palpable bilateral. Capillary return is immediate. Temperature gradient is WNL. Skin turgor WNL  Sensorium: Diminished  Semmes Weinstein monofilament test. Normal tactile sensation bilaterally. Nail Exam: Pt has thick disfigured discolored nails with subungual debris noted bilateral entire nail hallux through fifth toenails Ulcer Exam: There is no evidence of ulcer or pre-ulcerative changes or infection. Orthopedic Exam: Muscle tone and strength are WNL. No limitations in general ROM. No crepitus or effusions noted. Foot type and digits show no abnormalities.  Severe HAV  B/L with hammer toes.   Skin:  Porokeratosis sub 1 right foot asymptomatic. No infection or ulcers  Diagnosis:  Onychomycosis, , Pain in right toe, pain in left toes,    Treatment & Plan Procedures and Treatment: Consent by patient was obtained for treatment procedures. The patient understood the discussion of treatment and procedures well. All questions were answered thoroughly reviewed. Debridement of mycotic and hypertrophic toenails, 1 through 5 bilateral and clearing of subungual debris. No ulceration, no infection noted. Return Visit-Office Procedure: Patient instructed to return to the office for a follow up visit 3 months for continued evaluation and treatment.    Helane GuntherGregory Loyal Rudy DPM

## 2018-08-18 ENCOUNTER — Ambulatory Visit: Payer: Medicare Other | Admitting: Podiatry

## 2018-09-04 ENCOUNTER — Ambulatory Visit: Payer: Medicare Other | Admitting: Podiatry

## 2018-09-15 ENCOUNTER — Ambulatory Visit (INDEPENDENT_AMBULATORY_CARE_PROVIDER_SITE_OTHER): Payer: Medicare Other | Admitting: Family Medicine

## 2018-09-15 ENCOUNTER — Encounter: Payer: Self-pay | Admitting: Family Medicine

## 2018-09-15 DIAGNOSIS — R300 Dysuria: Secondary | ICD-10-CM | POA: Diagnosis not present

## 2018-09-15 DIAGNOSIS — M545 Low back pain, unspecified: Secondary | ICD-10-CM | POA: Insufficient documentation

## 2018-09-15 DIAGNOSIS — N390 Urinary tract infection, site not specified: Secondary | ICD-10-CM | POA: Diagnosis not present

## 2018-09-15 NOTE — Assessment & Plan Note (Signed)
On and off since Friday with some back pain  Grandson will pick up urine cup/hat to collect sample and bring back in am  Per him- she is more confused than usual as well  Enc her strongly to drink more fluids (he is working on that)  We will process urine and go from there  inst to call back or go to ED if symptoms suddenly worsen or change

## 2018-09-15 NOTE — Progress Notes (Addendum)
Virtual Visit via Telephone Note  I connected with Tracy Morales on 09/15/18 at  3:00 PM EDT by telephone and verified that I am speaking with the correct person using two identifiers. I am in my office Patient is at home    I discussed the limitations, risks, security and privacy concerns of performing an evaluation and management service by telephone and the availability of in person appointments. I also discussed with the patient that there may be a patient responsible charge related to this service. The patient expressed understanding and agreed to proceed.   History of Present Illness: Side and back pain    "my side is hurting" since Friday  L side / low back  No injury   Wondered if it was her kidney  Some irritation when she urinates/stings Had blood in urine one time - end of last week   No fever  Grandson has noticed some more confusion  No n/v  Does not look sick to him   Back hurts when she sits in her chair  Not up moving very much-baseline Some pain with walking  She shuffles  Back pain has kept her from sleeping   Took some tylenol this am -it does help some  Not drinking water- only when she takes her medicine    H/o arthritis in hips and deg disc dz  This could be MSK as well   Review of Systems  Constitutional: Negative for chills, diaphoresis, fever, malaise/fatigue and weight loss.  HENT: Positive for hearing loss.   Eyes: Negative for pain.  Respiratory: Negative for cough and shortness of breath.   Cardiovascular: Negative for chest pain and palpitations.  Gastrointestinal: Negative for abdominal pain, diarrhea, nausea and vomiting.  Genitourinary: Positive for dysuria, flank pain, frequency and hematuria.  Skin: Negative for itching and rash.  Neurological: Negative for dizziness and headaches.      Observations/Objective: Pt gives some hx and her grandson gives some  She sounds like her usual self  She is very hard of hearing on the phone  so grandson helps her  Answers questions appropriately  Mood is baseline  No cough or hoarse voice Is not apparently confused during out interview today   Assessment and Plan: Problem List Items Addressed This Visit      Genitourinary   Recurrent UTI    In patient who catheterizes Due to recurrent utis (more than 2 a year) this patient needs 180 closed system catheters per month  I am confident this will help cut down uti frequency        Other   Low back pain    Since Friday - with some  ? Urine symptoms as well She is worried about uti  Some pain with movement as well  Tylenol helps  inst grand son to come by and pick up urine collection equip and bring a fresh sample in am to process      Dysuria - Primary    On and off since Friday with some back pain  Grandson will pick up urine cup/hat to collect sample and bring back in am  Per him- she is more confused than usual as well  Enc her strongly to drink more fluids (he is working on that)  We will process urine and go from there  inst to call back or go to ED if symptoms suddenly worsen or change       Relevant Orders   POCT URINALYSIS DIP (CLINITEK) (Completed)  Urine Culture (Completed)       Follow Up Instructions: Please come get urine collection equipment and bring a fresh urine sample in am to check for infection  Encourage water intake please! If worse- call or go to ER Gentle heat to back 10 minutes at a time is ok Tylenol is ok     I discussed the assessment and treatment plan with the patient. The patient was provided an opportunity to ask questions and all were answered. The patient agreed with the plan and demonstrated an understanding of the instructions.   The patient was advised to call back or seek an in-person evaluation if the symptoms worsen or if the condition fails to improve as anticipated.  I provided 15  minutes of non-face-to-face time during this encounter.   Roxy MannsMarne Tower, MD

## 2018-09-15 NOTE — Assessment & Plan Note (Signed)
Since Friday - with some  ? Urine symptoms as well She is worried about uti  Some pain with movement as well  Tylenol helps  inst grand son to come by and pick up urine collection equip and bring a fresh sample in am to process

## 2018-09-16 ENCOUNTER — Telehealth: Payer: Self-pay | Admitting: Family Medicine

## 2018-09-16 ENCOUNTER — Telehealth: Payer: Self-pay

## 2018-09-16 LAB — POCT URINALYSIS DIP (CLINITEK)
Bilirubin, UA: NEGATIVE
Blood, UA: NEGATIVE
Glucose, UA: NEGATIVE mg/dL
Ketones, POC UA: NEGATIVE mg/dL
Nitrite, UA: NEGATIVE
POC PROTEIN,UA: NEGATIVE
Spec Grav, UA: 1.015 (ref 1.010–1.025)
Urobilinogen, UA: 0.2 E.U./dL
pH, UA: 6 (ref 5.0–8.0)

## 2018-09-16 MED ORDER — CEPHALEXIN 500 MG PO CAPS: 500.0000 mg | ORAL_CAPSULE | Freq: Two times a day (BID) | ORAL | 0 refills | Status: DC

## 2018-09-16 NOTE — Telephone Encounter (Signed)
[facility] Night - Client Nonclinical Telephone Record [facility] Medical Call Center Client [facility] Night - Client Client Site [facility] [place] - Night Physician Roxy Manns - MD Contact Type Call Who Is Calling Patient / Member / Family / Caregiver Caller Name Mikki Santee Phone Number [phone] Patient Name Tracy Morales Patient DOB 1919-06-12 Call Type Message Only Information Provided Reason for Call Request for General Office Information Initial Comment Caller states his grandmother just got done doing the Urine sample. Does he need to go to the front door to give it to the office? Additional Comment They have the urine sample but need to know which door to bring it to when they drop it off please call him back, thank you! Call Closed By: Glendale Chard Transaction Date/Time: 09/16/2018 7:23:09 AM (ET)

## 2018-09-16 NOTE — Telephone Encounter (Signed)
Pt's daughter notified of UA results and Dr. Royden Purl comments

## 2018-09-16 NOTE — Addendum Note (Signed)
Addended by: Alvina Chou on: 09/16/2018 09:20 AM   Modules accepted: Orders

## 2018-09-16 NOTE — Telephone Encounter (Signed)
Likely uti  I will send keflex to her pharmacy Keep [place] posted Will alert when urine culture returns

## 2018-09-16 NOTE — Telephone Encounter (Signed)
Tracy Morales has already dropped off urine sample and it as been addressed

## 2018-09-18 ENCOUNTER — Telehealth: Payer: Self-pay | Admitting: Family Medicine

## 2018-09-18 LAB — URINE CULTURE
MICRO NUMBER:: 394421
SPECIMEN QUALITY:: ADEQUATE

## 2018-09-18 MED ORDER — AMOXICILLIN-POT CLAVULANATE 875-125 MG PO TABS: 1.0000 | ORAL_TABLET | Freq: Two times a day (BID) | ORAL | 0 refills | Status: DC

## 2018-09-18 NOTE — Telephone Encounter (Signed)
Urine cx is pos for infection and I need to change abx to augmentin  I sent it in   Stop the keflex  Let me know if symptoms do not improve

## 2018-09-18 NOTE — Telephone Encounter (Signed)
Daughter notified of Dr. Royden Purl comments regarding Urine cx

## 2018-09-19 ENCOUNTER — Telehealth: Payer: Self-pay | Admitting: Family Medicine

## 2018-09-19 NOTE — Telephone Encounter (Signed)
Olivia with [facility] is calling to request the patient most recent office notes   They supply the patient's Urological supplies and need to send a catheter out to the patient.   Zollie Scale spoke with the patient's urologist Dr Evelene Croon who suggested they call our office to obtain these since The last UTI she had was treated by Dr Milinda Antis in our office.   FAX- [phone] ATTN:Sadie

## 2018-09-19 NOTE — Telephone Encounter (Signed)
Per [place] we need a release to send notes. Called pt's daughter and either her or pt's grandson which are both POA's will come and sign a release before we fax over the OV notes

## 2018-09-22 DIAGNOSIS — N390 Urinary tract infection, site not specified: Secondary | ICD-10-CM | POA: Insufficient documentation

## 2018-09-22 NOTE — Telephone Encounter (Signed)
Note placed in inbox for review

## 2018-09-22 NOTE — Telephone Encounter (Signed)
Family came in office and signed the release so OV and UA/ Culture faxed to [facility]

## 2018-09-22 NOTE — Telephone Encounter (Signed)
Done and in IN box 

## 2018-09-22 NOTE — Telephone Encounter (Signed)
Tracy Morales with [facility] came by office. She dropped off a letter regarding patient switching to closed system catheter. Placed letter in [employer].

## 2018-09-22 NOTE — Assessment & Plan Note (Signed)
In patient who catheterizes Due to recurrent utis (more than 2 a year) this patient needs 180 closed system catheters per month  I am confident this will help cut down uti frequency

## 2018-09-24 ENCOUNTER — Telehealth: Payer: Self-pay | Admitting: *Deleted

## 2018-09-24 MED ORDER — CIPROFLOXACIN HCL 250 MG PO TABS: 250.0000 mg | ORAL_TABLET | Freq: Every day | ORAL | 0 refills | Status: DC

## 2018-09-24 NOTE — Telephone Encounter (Signed)
Copied from CRM [id number]. Topic: General - Other >> Sep 23, 2018  4:50 PM Mcneil, Ja-Kwan wrote: Reason for CRM: Pt daughter Maren Reamer stated pt only has one day of the antibiotics remaining and she is still having the burning sensation and back pain. Pt daughter requests call back. Cb#  [phone]

## 2018-09-24 NOTE — Telephone Encounter (Signed)
Please ask if her urine looks cloudy/smells foul?  Are her symptoms improved at all or worse?  I filled out forms to change her catheter system yesterday and I hope they can do that soon    We can next try low dose cipro (I am adjusting this dose for her current kidney function with 250 mg once daily)  Take this for 3 days I sent it to her pharmacy This medication can cause tendon problems (if pain stop it and let me know) and mental status changes -please keep me updated   Then I want them to bring in urine sample to repeat culture next week after finishing abx   Keep me posted

## 2018-09-24 NOTE — Telephone Encounter (Signed)
Spoke with Tracy Morales, stated that the urine is clear and does not have any noticeable smells/odors. Stated that her mother was feeling better but c/o back pain yesterday and today. She is going to get CIPRO 250 MG to start today and gave verbal understanding to bring urine sample next week after abx is finished.

## 2018-09-29 ENCOUNTER — Other Ambulatory Visit: Payer: Self-pay

## 2018-09-29 ENCOUNTER — Other Ambulatory Visit (INDEPENDENT_AMBULATORY_CARE_PROVIDER_SITE_OTHER): Payer: Medicare Other

## 2018-09-29 DIAGNOSIS — N390 Urinary tract infection, site not specified: Secondary | ICD-10-CM | POA: Diagnosis not present

## 2018-09-29 DIAGNOSIS — M545 Low back pain, unspecified: Secondary | ICD-10-CM

## 2018-09-29 DIAGNOSIS — R8290 Unspecified abnormal findings in urine: Secondary | ICD-10-CM

## 2018-09-29 LAB — POC URINALSYSI DIPSTICK (AUTOMATED)
Bilirubin, UA: NEGATIVE
Blood, UA: NEGATIVE
Glucose, UA: NEGATIVE
Ketones, UA: NEGATIVE
Nitrite, UA: NEGATIVE
Protein, UA: NEGATIVE
Spec Grav, UA: 1.02 (ref 1.010–1.025)
Urobilinogen, UA: 0.2 E.U./dL
pH, UA: 6 (ref 5.0–8.0)

## 2018-09-29 NOTE — Telephone Encounter (Signed)
Family dropped off urine today

## 2018-09-29 NOTE — Telephone Encounter (Signed)
Spoke with Tracy Morales and she said since we only have it documented that she has had only 1 UTI in the last 12 months in our office she doesn't qualify for the catheters her family was requesting. They have already check with urologist Dr. Amada [place] office and she doesn't have any UTI's documented in their system either. Since she hasn't had 2 or more UTI's in the past 12 months Tracy Morales said the form she dropped off isn't necessary to fill out and she will just advise pt and family that they can't order theses cath's right now.   FYI to Dr. Milinda Antis (no more action is needed on our end)

## 2018-09-30 LAB — URINE CULTURE
MICRO NUMBER:: 424026
Result:: NO GROWTH
SPECIMEN QUALITY:: ADEQUATE

## 2018-10-06 ENCOUNTER — Telehealth: Payer: Self-pay | Admitting: *Deleted

## 2018-10-06 NOTE — Telephone Encounter (Signed)
Tracy Morales with [employer] [employer] called stating that she was advised that patient was recently treated for a UTI. Tracy Morales stated that they are the supplier for patient's urological supplies. Tracy Morales stated that she is trying to figure out if patient qualifies for closed system catheters? Tracy Morales stated that she  contacted her urologist office and she was told that they have not treated her for over a year and to contact Tracy Morales office.

## 2018-10-06 NOTE — Telephone Encounter (Signed)
My understanding is that it was formally denied because she had not had more than 2 utis in the past 12 months

## 2018-10-07 NOTE — Telephone Encounter (Signed)
Left message for Tracy Morales from [employer] to call back

## 2018-10-07 NOTE — Telephone Encounter (Signed)
Spoke with Zollie Scale and advised as below.

## 2018-12-04 ENCOUNTER — Ambulatory Visit (INDEPENDENT_AMBULATORY_CARE_PROVIDER_SITE_OTHER): Payer: Medicare Other | Admitting: Podiatry

## 2018-12-04 ENCOUNTER — Encounter: Payer: Self-pay | Admitting: Podiatry

## 2018-12-04 ENCOUNTER — Other Ambulatory Visit: Payer: Self-pay

## 2018-12-04 DIAGNOSIS — M79675 Pain in left toe(s): Secondary | ICD-10-CM

## 2018-12-04 DIAGNOSIS — M79674 Pain in right toe(s): Secondary | ICD-10-CM

## 2018-12-04 DIAGNOSIS — B351 Tinea unguium: Secondary | ICD-10-CM | POA: Diagnosis not present

## 2018-12-04 NOTE — Progress Notes (Signed)
Complaint:  Visit Type: Patient returns to my office for continued preventative foot care services. Complaint: Patient states" my nails have grown long and thick and become painful to walk and wear shoes" . The patient presents for preventative foot care services. No changes to ROS  Podiatric Exam: Vascular: dorsalis pedis and posterior tibial pulses are palpable bilateral. Capillary return is immediate. Temperature gradient is WNL. Skin turgor WNL  Sensorium: Diminished  Semmes Weinstein monofilament test. Normal tactile sensation bilaterally. Nail Exam: Pt has thick disfigured discolored nails with subungual debris noted bilateral entire nail hallux through fifth toenails Ulcer Exam: There is no evidence of ulcer or pre-ulcerative changes or infection. Orthopedic Exam: Muscle tone and strength are WNL. No limitations in general ROM. No crepitus or effusions noted. Foot type and digits show no abnormalities.  Severe HAV  B/L with hammer toes.  Pes planus  B/L. Skin:  Porokeratosis sub 1 right foot asymptomatic. No infection or ulcers  Diagnosis:  Onychomycosis, , Pain in right toe, pain in left toes,    Treatment & Plan Procedures and Treatment: Consent by patient was obtained for treatment procedures. The patient understood the discussion of treatment and procedures well. All questions were answered thoroughly reviewed. Debridement of mycotic and hypertrophic toenails, 1 through 5 bilateral and clearing of subungual debris. No ulceration, no infection noted. Return Visit-Office Procedure: Patient instructed to return to the office for a follow up visit 3 months for continued evaluation and treatment.    Gardiner Barefoot DPM

## 2019-02-14 ENCOUNTER — Emergency Department
Admission: EM | Admit: 2019-02-14 | Discharge: 2019-02-14 | Disposition: A | Discharge status: Home / Self Care | Payer: Medicare Other | Attending: Emergency Medicine | Admitting: Emergency Medicine

## 2019-02-14 ENCOUNTER — Emergency Department: Payer: Medicare Other

## 2019-02-14 ENCOUNTER — Encounter: Payer: Self-pay | Admitting: Emergency Medicine

## 2019-02-14 ENCOUNTER — Other Ambulatory Visit: Payer: Self-pay

## 2019-02-14 DIAGNOSIS — I129 Hypertensive chronic kidney disease with stage 1 through stage 4 chronic kidney disease, or unspecified chronic kidney disease: Secondary | ICD-10-CM | POA: Diagnosis not present

## 2019-02-14 DIAGNOSIS — Z79899 Other long term (current) drug therapy: Secondary | ICD-10-CM | POA: Insufficient documentation

## 2019-02-14 DIAGNOSIS — R51 Headache: Secondary | ICD-10-CM | POA: Diagnosis present

## 2019-02-14 DIAGNOSIS — Z7982 Long term (current) use of aspirin: Secondary | ICD-10-CM | POA: Insufficient documentation

## 2019-02-14 DIAGNOSIS — N189 Chronic kidney disease, unspecified: Secondary | ICD-10-CM | POA: Diagnosis not present

## 2019-02-14 DIAGNOSIS — R519 Headache, unspecified: Secondary | ICD-10-CM

## 2019-02-14 LAB — CBC WITH DIFFERENTIAL/PLATELET
Abs Immature Granulocytes: 0.01 10*3/uL (ref 0.00–0.07)
Basophils Absolute: 0.1 10*3/uL (ref 0.0–0.1)
Basophils Relative: 2 %
Eosinophils Absolute: 0.2 10*3/uL (ref 0.0–0.5)
Eosinophils Relative: 4 %
HCT: 39.2 % (ref 36.0–46.0)
Hemoglobin: 12.9 g/dL (ref 12.0–15.0)
Immature Granulocytes: 0 %
Lymphocytes Relative: 41 %
Lymphs Abs: 1.9 10*3/uL (ref 0.7–4.0)
MCH: 29.1 pg (ref 26.0–34.0)
MCHC: 32.9 g/dL (ref 30.0–36.0)
MCV: 88.3 fL (ref 80.0–100.0)
Monocytes Absolute: 0.7 10*3/uL (ref 0.1–1.0)
Monocytes Relative: 14 %
Neutro Abs: 1.8 10*3/uL (ref 1.7–7.7)
Neutrophils Relative %: 39 %
Platelets: 200 10*3/uL (ref 150–400)
RBC: 4.44 MIL/uL (ref 3.87–5.11)
RDW: 15.5 % (ref 11.5–15.5)
WBC: 4.6 10*3/uL (ref 4.0–10.5)
nRBC: 0 % (ref 0.0–0.2)

## 2019-02-14 LAB — COMPREHENSIVE METABOLIC PANEL WITH GFR
ALT: 14 U/L (ref 0–44)
AST: 28 U/L (ref 15–41)
Albumin: 3.8 g/dL (ref 3.5–5.0)
Alkaline Phosphatase: 93 U/L (ref 38–126)
Anion gap: 7 (ref 5–15)
BUN: 33 mg/dL — ABNORMAL HIGH (ref 8–23)
CO2: 26 mmol/L (ref 22–32)
Calcium: 9.2 mg/dL (ref 8.9–10.3)
Chloride: 107 mmol/L (ref 98–111)
Creatinine, Ser: 1.53 mg/dL — ABNORMAL HIGH (ref 0.44–1.00)
GFR calc Af Amer: 33 mL/min — ABNORMAL LOW
GFR calc non Af Amer: 28 mL/min — ABNORMAL LOW
Glucose, Bld: 107 mg/dL — ABNORMAL HIGH (ref 70–99)
Potassium: 4.4 mmol/L (ref 3.5–5.1)
Sodium: 140 mmol/L (ref 135–145)
Total Bilirubin: 0.4 mg/dL (ref 0.3–1.2)
Total Protein: 7.4 g/dL (ref 6.5–8.1)

## 2019-02-14 MED ORDER — SODIUM CHLORIDE 0.9 % IV BOLUS
500.0000 mL | Freq: Once | INTRAVENOUS | Status: AC
  Administered 2019-02-14: 500 mL via INTRAVENOUS

## 2019-02-14 MED ORDER — IOHEXOL 350 MG/ML SOLN
60.0000 mL | Freq: Once | INTRAVENOUS | Status: AC | PRN
  Administered 2019-02-14: 60 mL via INTRAVENOUS

## 2019-02-14 NOTE — ED Notes (Signed)
Pt assisted to toilet using walker with minimal assistance.

## 2019-02-14 NOTE — ED Triage Notes (Signed)
Patient states that she was woke up about 02:00 this am from the worst headache of her life. Patient states that she took tylenol and that her headache has improved some.

## 2019-02-14 NOTE — ED Provider Notes (Signed)
[facility] Emergency Department Provider Note  Time seen: 7:35 AM  I have reviewed the triage vital signs and the nursing notes.   HISTORY  Chief Complaint Headache   HPI Tracy Morales is a 85 y.o. female with a past medical history of CKD, dizziness, hypertension, hyperlipidemia, presents to the emergency department for a headache.  According to the patient she awoke from her sleep at 2:00 in the morning with "the worst headache."  Patient describes a headache at the base of the right skull and into the neck.  Denies any weakness or numbness.  Denies confusion or slurred speech.  Patient did take Tylenol for the headache and states that has decreased it to a 5/10 currently.  Denies any nausea or vomiting.  No fever cough or congestion.  Past Medical History:  Diagnosis Date  . Allergic rhinitis, cause unspecified   . Bradycardia   . Cerebrovascular disease, unspecified   . Chronic kidney disease, unspecified    chronic renal insufficiency  . Dizziness and giddiness   . DVT (deep venous thrombosis) (HCC)    R  . Dyspnea    echo with normal EF. +diastolic dysfunction  . 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 HTN (hypertension)   . Hyperlipidemia   . Hypertonicity of bladder    hx  . Mild depression (HCC)   . Mononeuritis of lower limb, unspecified    peripheral neuropathy, bilateral  . Occlusion and stenosis of carotid artery without mention of cerebral infarction 4/09   u/s L 40-59%, R 0-39%; stable for many years  . Osteoarthrosis, unspecified whether generalized or localized, unspecified site   . Peripheral vascular disease, unspecified (HCC)   . Polymyalgia rheumatica (HCC)    hx  . RUQ pain   . Urge incontinence     Patient Active Problem List   Diagnosis Date Noted  . Pain due to onychomycosis of toenails of both feet 12/04/2018  . Recurrent UTI 09/22/2018  . Low back pain 09/15/2018  . Dysuria 09/15/2018  . MCI (mild  cognitive impairment) 05/11/2018  . Itchy scalp 03/01/2018  . Frequent urination 02/28/2018  . Mobility impaired 05/06/2017  . Osteoarthritis of both hips 04/15/2015  . Hip pain 04/14/2015  . Routine general medical examination at a health care facility 04/12/2015  . Hair loss 11/19/2014  . Loss of weight 06/09/2014  . Arthritis of right shoulder region 06/09/2014  . Nodule of left lung 06/09/2014  . Encounter for Medicare annual wellness exam 10/30/2013  . H/O fall 04/27/2013  . Risk for falls 04/11/2012  . Leg pain, bilateral 01/29/2012  . MICROSCOPIC HEMATURIA 08/29/2009  . CONDUCTIVE HEARING LOSS BILATERAL 08/08/2009  . EDEMA 04/07/2009  . BRADYCARDIA 10/26/2008  . DYSPNEA 10/26/2008  . INCONTINENCE, URGE 02/27/2008  . Hyperlipidemia 10/11/2006  . PERIPHERAL NEUROPATHY, LOWER EXTREMITIES, BILATERAL 10/11/2006  . Essential hypertension 10/11/2006  . DIASTOLIC DYSFUNCTION 10/11/2006  . CAROTID STENOSIS 10/11/2006  . CEREBROVASCULAR DISEASE 10/11/2006  . PERIPHERAL VASCULAR DISEASE 10/11/2006  . ALLERGIC RHINITIS 10/11/2006  . Chronic kidney disease 10/11/2006  . OVERACTIVE BLADDER 10/11/2006  . OSTEOARTHRITIS 10/11/2006  . CARDIAC MURMUR, HX OF 10/11/2006    Past Surgical History:  Procedure Laterality Date  . 2D echo  8/06   mild aortic stenosis.   Marland Kitchen. arterial doppler    . carotid doppler  4/02   bilateral plaque   . carotid doppler  8/06   non significant stenosis  . carotid doppler  R 40%; L 40-60%  . CATARACT EXTRACTION    . CHOLECYSTECTOMY  2000  . COLONOSCOPY  1/04   polyps  . CT SCAN  05/25/08   abd and pelvis- inflated urinary bladder and large amt stool throughout w/o blockage   . dexa  2/01   osteopenia  . echo     mild LVH EF 50% As  . triger finger contracture  6/04  . vert. basilar insufficiency  2002  . zoster  5/02    Prior to Admission medications   Medication Sig Start Date End Date Taking? Authorizing Provider  acetaminophen  (TYLENOL) 500 MG tablet Take 500 mg by mouth every 6 (six) hours as needed. OTC-UAD     [provider]  amLODipine (NORVASC) 5 MG tablet Take 1 tablet (5 mg total) by mouth daily. 05/09/18   Tower, Audrie GallusMarne A, MD  amoxicillin-clavulanate (AUGMENTIN) 875-125 MG tablet Take 1 tablet by mouth 2 (two) times daily. 09/18/18   Tower, Audrie GallusMarne A, MD  aspirin 325 MG EC tablet Take 325 mg by mouth daily.    [provider]  cephALEXin (KEFLEX) 500 MG capsule Take 1 capsule (500 mg total) by mouth 2 (two) times daily. 09/16/18   Tower, Audrie GallusMarne A, MD  Cholecalciferol (VITAMIN D3) 2000 units capsule Take 2,000 Units by mouth daily.    [provider]  ciprofloxacin (CIPRO) 250 MG tablet Take 1 tablet (250 mg total) by mouth daily. 09/24/18   Tower, Audrie GallusMarne A, MD  ferrous sulfate 325 (65 FE) MG tablet Take 1 tablet (325 mg total) by mouth 2 (two) times daily. 05/09/18   Tower, Audrie GallusMarne A, MD  Meclizine HCl (BONINE PO) Take by mouth as needed.    [provider]  mirtazapine (REMERON) 30 MG tablet Take 1 tablet (30 mg total) by mouth at bedtime. 05/09/18   Tower, Audrie GallusMarne A, MD  Multiple Vitamin (MULTIVITAMIN) tablet Take 1 tablet by mouth daily.    [provider]  Lise AuerNON FORMULARY Walker with wheels - for use with ambulation once daily. 429.9, 715.90, 355.9     [provider]  psyllium (METAMUCIL) 58.6 % packet Take 1 packet by mouth daily.    [provider]  simvastatin (ZOCOR) 20 MG tablet Take one pill by mouth once daily 05/09/18   Tower, Audrie GallusMarne A, MD    Allergies  Allergen Reactions  . Ace Inhibitors     REACTION: cough    Family History  Problem Relation Age of Onset  . Hypertension Father   . Other Father        Cardiac problems  . Heart disease Father   . Other Brother        blood clot  . Other Sister        benign breast nodule    Social History Social History   Tobacco Use  . Smoking status: Never Smoker  . Smokeless tobacco: Never Used  .  Tobacco comment: non smoker   Substance Use Topics  . Alcohol use: No    Alcohol/week: 0.0 standard drinks  . Drug use: No    Review of Systems Constitutional: Negative for fever. Cardiovascular: Negative for chest pain. Respiratory: Negative for shortness of breath. Gastrointestinal: Negative for abdominal pain Skin: Negative for skin complaints  Neurological: Positive for headache. All other ROS negative  ____________________________________________   PHYSICAL EXAM:  VITAL SIGNS: ED Triage Vitals  Enc Vitals Group     BP 02/14/19 0502 (!) 160/77  Pulse Rate 02/14/19 0502 62     Resp 02/14/19 0502 18     Temp 02/14/19 0502 97.6 F (36.4 C)     Temp Source 02/14/19 0502 Oral     SpO2 02/14/19 0502 100 %     Weight 02/14/19 0503 130 lb (59 kg)     Height 02/14/19 0503 5\' 5"  (1.651 m)     Head Circumference --      Peak Flow --      Pain Score 02/14/19 0503 5     Pain Loc --      Pain Edu? --      Excl. in GC? --    Constitutional: Alert and oriented. Well appearing and in no distress. Eyes: Normal exam ENT      Head: Normocephalic and atraumatic.  Patient does have tenderness to palpation along the low occipital scalp on the right and into the upper C-spine on the right.  No midline tenderness      Mouth/Throat: Mucous membranes are moist. Cardiovascular: Normal rate, regular rhythm. No murmur Respiratory: Normal respiratory effort without tachypnea nor retractions. Breath sounds are clear  Gastrointestinal: Soft and nontender. No distention.   Musculoskeletal: Nontender with normal range of motion in all extremities. Neurologic:  Normal speech and language. No gross focal neurologic deficits Skin:  Skin is warm, dry and intact.  Psychiatric: Mood and affect are normal.   ____________________________________________   RADIOLOGY  CT is negative for acute abnormality.  ____________________________________________   INITIAL IMPRESSION / ASSESSMENT AND  PLAN / ED COURSE  Pertinent labs & imaging results that were available during my care of the patient were reviewed by me and considered in my medical decision making (see chart for details).   Patient presents to the emergency department for headache.  Patient's headache/pain is in the lower occipital scalp on the right and into the upper C-spine paraspinal muscles also on the right.  Differential would include ICH, tumor, mass, aneurysm, musculoskeletal pain.  Patient states she rarely gets headaches and this was a severe headache.  Patient states she did awake from her sleep with a headache.  Differential would also include occipital neuralgia or musculoskeletal pain at the neck from sleeping.  Patient's work-up is essentially negative/baseline for the patient.  CT of the head is negative.  I did discuss with the patient pros and cons of obtaining a CT angiography to evaluate for aneurysm they would like to proceed with this.  Patient's GFR is borderline we will IV hydrate with 500 cc of normal saline.   CTA shows no concerning findings at this time.  We will discharge patient home have her follow-up with her primary care doctor.  Patient's headache is mild currently.  Highly suspect musculoskeletal pain given the location of the discomfort and starting while she was asleep.  Tracy Morales was evaluated in Emergency Department on 02/14/2019 for the symptoms described in the history of present illness. She was evaluated in the context of the global COVID-19 pandemic, which necessitated consideration that the patient might be at risk for infection with the SARS-CoV-2 virus that causes COVID-19. Institutional protocols and algorithms that pertain to the evaluation of patients at risk for COVID-19 are in a state of rapid change based on information released by regulatory bodies including the CDC and federal and state organizations. These policies and algorithms were followed during the patient's care in the  ED.  ____________________________________________   FINAL CLINICAL IMPRESSION(S) / ED DIAGNOSES  Headache  Harvest Dark, MD 02/14/19 [phone]

## 2019-02-20 ENCOUNTER — Ambulatory Visit (INDEPENDENT_AMBULATORY_CARE_PROVIDER_SITE_OTHER): Payer: Medicare Other

## 2019-02-20 DIAGNOSIS — Z23 Encounter for immunization: Secondary | ICD-10-CM

## 2019-02-25 ENCOUNTER — Telehealth: Payer: Self-pay

## 2019-02-25 NOTE — Telephone Encounter (Signed)
Enis Slipper (daughter) returned your call Best number [phone]

## 2019-02-25 NOTE — Telephone Encounter (Signed)
pts daughter, Enis Slipper (DPR signed) left v/m that pt was seen in ED on 02/14/19 for H/A; ED thought possibly the way pt laid on pillow may have caused H/A but no definite cause of H/A found and pt also fell on 02/24/19 but no injury noted. [name] wants to know if Dr Glori Bickers wants to see pt as FU or continue to observe pt and see how she is doing. (pt has a grandchild that stays with pt.)[name] said pt said she is OK and no complaints noted. Pt has no covid symptoms, no travel and no known exposure to + covid. [name] request cb.

## 2019-02-25 NOTE — Telephone Encounter (Signed)
Called [name] back and she had listened to my VM. She verbalized understanding of Dr. Marliss Coots plan

## 2019-02-25 NOTE — Telephone Encounter (Signed)
I reviewed the re assuring w/u in the ED with CT scan and exam  Sounds like it was related to musculoskeletal issue  As long as her symptoms are gone we can hold off on a follow up  However if symptoms return or new ones develop let me know

## 2019-02-25 NOTE — Telephone Encounter (Signed)
Left VM letting [name] know Dr. Marliss Coots comments

## 2019-03-05 ENCOUNTER — Ambulatory Visit: Payer: Medicare Other | Admitting: Podiatry

## 2019-03-16 ENCOUNTER — Encounter: Payer: Self-pay | Admitting: Podiatry

## 2019-03-16 ENCOUNTER — Other Ambulatory Visit: Payer: Self-pay

## 2019-03-16 ENCOUNTER — Ambulatory Visit: Payer: Medicare Other | Admitting: Podiatry

## 2019-03-16 DIAGNOSIS — M79674 Pain in right toe(s): Secondary | ICD-10-CM | POA: Diagnosis not present

## 2019-03-16 DIAGNOSIS — M79675 Pain in left toe(s): Secondary | ICD-10-CM | POA: Diagnosis not present

## 2019-03-16 DIAGNOSIS — B351 Tinea unguium: Secondary | ICD-10-CM | POA: Diagnosis not present

## 2019-03-16 DIAGNOSIS — E1149 Type 2 diabetes mellitus with other diabetic neurological complication: Secondary | ICD-10-CM | POA: Diagnosis not present

## 2019-03-16 NOTE — Progress Notes (Signed)
Complaint:  Visit Type: Patient returns to my office for continued preventative foot care services. Complaint: Patient states" my nails have grown long and thick and become painful to walk and wear shoes" . The patient presents for preventative foot care services. No changes to ROS  Podiatric Exam: Vascular: dorsalis pedis and posterior tibial pulses are palpable bilateral. Capillary return is immediate. Temperature gradient is WNL. Skin turgor WNL  Sensorium: Diminished  Semmes Weinstein monofilament test. Normal tactile sensation bilaterally. Nail Exam: Pt has thick disfigured discolored nails with subungual debris noted bilateral entire nail hallux through fifth toenails Ulcer Exam: There is no evidence of ulcer or pre-ulcerative changes or infection. Orthopedic Exam: Muscle tone and strength are WNL. No limitations in general ROM. No crepitus or effusions noted. Foot type and digits show no abnormalities.  Severe HAV  B/L with hammer toes.  Pes planus  B/L. Skin:  Porokeratosis sub 1 right foot asymptomatic. No infection or ulcers  Diagnosis:  Onychomycosis, , Pain in right toe, pain in left toes,    Treatment & Plan Procedures and Treatment: Consent by patient was obtained for treatment procedures. The patient understood the discussion of treatment and procedures well. All questions were answered thoroughly reviewed. Debridement of mycotic and hypertrophic toenails, 1 through 5 bilateral and clearing of subungual debris. No ulceration, no infection noted. Return Visit-Office Procedure: Patient instructed to return to the office for a follow up visit 10 weeks  for continued evaluation and treatment.    Gardiner Barefoot DPM

## 2019-05-11 ENCOUNTER — Telehealth: Payer: Self-pay | Admitting: Family Medicine

## 2019-05-11 DIAGNOSIS — E7800 Pure hypercholesterolemia, unspecified: Secondary | ICD-10-CM

## 2019-05-11 DIAGNOSIS — R7303 Prediabetes: Secondary | ICD-10-CM | POA: Insufficient documentation

## 2019-05-11 DIAGNOSIS — I10 Essential (primary) hypertension: Secondary | ICD-10-CM

## 2019-05-11 DIAGNOSIS — R7309 Other abnormal glucose: Secondary | ICD-10-CM | POA: Insufficient documentation

## 2019-05-11 NOTE — Telephone Encounter (Signed)
-----   Message from Ellamae Sia sent at 05/07/2019 10:48 AM EST ----- Regarding: Lab orders for 12.8.20  AWV lab orders, please.

## 2019-05-12 ENCOUNTER — Other Ambulatory Visit (INDEPENDENT_AMBULATORY_CARE_PROVIDER_SITE_OTHER): Payer: Medicare Other

## 2019-05-12 ENCOUNTER — Ambulatory Visit: Payer: Medicare Other

## 2019-05-12 ENCOUNTER — Other Ambulatory Visit: Payer: Self-pay

## 2019-05-12 DIAGNOSIS — I10 Essential (primary) hypertension: Secondary | ICD-10-CM

## 2019-05-12 DIAGNOSIS — E7800 Pure hypercholesterolemia, unspecified: Secondary | ICD-10-CM | POA: Diagnosis not present

## 2019-05-12 DIAGNOSIS — R7309 Other abnormal glucose: Secondary | ICD-10-CM

## 2019-05-12 LAB — CBC WITH DIFFERENTIAL/PLATELET
Basophils Absolute: 0 10*3/uL (ref 0.0–0.1)
Basophils Relative: 0.7 % (ref 0.0–3.0)
Eosinophils Absolute: 0.1 10*3/uL (ref 0.0–0.7)
Eosinophils Relative: 2.8 % (ref 0.0–5.0)
HCT: 39.6 % (ref 36.0–46.0)
Hemoglobin: 13 g/dL (ref 12.0–15.0)
Lymphocytes Relative: 43.7 % (ref 12.0–46.0)
Lymphs Abs: 1.6 10*3/uL (ref 0.7–4.0)
MCHC: 32.7 g/dL (ref 30.0–36.0)
MCV: 89.9 fl (ref 78.0–100.0)
Monocytes Absolute: 0.5 10*3/uL (ref 0.1–1.0)
Monocytes Relative: 14.1 % — ABNORMAL HIGH (ref 3.0–12.0)
Neutro Abs: 1.4 10*3/uL (ref 1.4–7.7)
Neutrophils Relative %: 38.7 % — ABNORMAL LOW (ref 43.0–77.0)
Platelets: 218 10*3/uL (ref 150.0–400.0)
RBC: 4.4 Mil/uL (ref 3.87–5.11)
RDW: 14.8 % (ref 11.5–15.5)
WBC: 3.7 10*3/uL — ABNORMAL LOW (ref 4.0–10.5)

## 2019-05-12 LAB — LIPID PANEL
Cholesterol: 167 mg/dL (ref 0–200)
HDL: 40.2 mg/dL (ref >39.00)
LDL Cholesterol: 113 mg/dL — ABNORMAL HIGH (ref 0–99)
NonHDL: 126.8
Total CHOL/HDL Ratio: 4
Triglycerides: 68 mg/dL (ref 0.0–149.0)
VLDL: 13.6 mg/dL (ref 0.0–40.0)

## 2019-05-12 LAB — COMPREHENSIVE METABOLIC PANEL
ALT: 10 U/L (ref 0–35)
AST: 24 U/L (ref 0–37)
Albumin: 3.7 g/dL (ref 3.5–5.2)
Alkaline Phosphatase: 92 U/L (ref 39–117)
BUN: 27 mg/dL — ABNORMAL HIGH (ref 6–23)
CO2: 28 mEq/L (ref 19–32)
Calcium: 9.3 mg/dL (ref 8.4–10.5)
Chloride: 104 mEq/L (ref 96–112)
Creatinine, Ser: 1.49 mg/dL — ABNORMAL HIGH (ref 0.40–1.20)
GFR: 39.13 mL/min — ABNORMAL LOW (ref >60.00)
Glucose, Bld: 94 mg/dL (ref 70–99)
Potassium: 4 mEq/L (ref 3.5–5.1)
Sodium: 140 mEq/L (ref 135–145)
Total Bilirubin: 0.5 mg/dL (ref 0.2–1.2)
Total Protein: 6.9 g/dL (ref 6.0–8.3)

## 2019-05-12 LAB — HEMOGLOBIN A1C: Hgb A1c MFr Bld: 6 % (ref 4.6–6.5)

## 2019-05-12 LAB — TSH: TSH: 3.05 u[IU]/mL (ref 0.35–4.50)

## 2019-05-15 ENCOUNTER — Other Ambulatory Visit: Payer: Self-pay

## 2019-05-15 ENCOUNTER — Ambulatory Visit (INDEPENDENT_AMBULATORY_CARE_PROVIDER_SITE_OTHER): Payer: Medicare Other | Admitting: Family Medicine

## 2019-05-15 VITALS — BP 138/78 | HR 63 | Temp 97.3°F | Wt 127.3 lb

## 2019-05-15 DIAGNOSIS — I10 Essential (primary) hypertension: Secondary | ICD-10-CM

## 2019-05-15 DIAGNOSIS — G3184 Mild cognitive impairment, so stated: Secondary | ICD-10-CM

## 2019-05-15 DIAGNOSIS — I739 Peripheral vascular disease, unspecified: Secondary | ICD-10-CM

## 2019-05-15 DIAGNOSIS — N189 Chronic kidney disease, unspecified: Secondary | ICD-10-CM

## 2019-05-15 DIAGNOSIS — Z0000 Encounter for general adult medical examination without abnormal findings: Secondary | ICD-10-CM

## 2019-05-15 DIAGNOSIS — R7309 Other abnormal glucose: Secondary | ICD-10-CM

## 2019-05-15 DIAGNOSIS — Z9181 History of falling: Secondary | ICD-10-CM

## 2019-05-15 DIAGNOSIS — E7800 Pure hypercholesterolemia, unspecified: Secondary | ICD-10-CM

## 2019-05-15 MED ORDER — AMLODIPINE BESYLATE 5 MG PO TABS: 5.0000 mg | ORAL_TABLET | Freq: Every day | ORAL | 11 refills | Status: DC

## 2019-05-15 MED ORDER — MIRTAZAPINE 30 MG PO TABS: 30.0000 mg | ORAL_TABLET | Freq: Every day | ORAL | 11 refills | Status: DC

## 2019-05-15 MED ORDER — SIMVASTATIN 20 MG PO TABS: ORAL_TABLET | ORAL | 11 refills | Status: DC

## 2019-05-15 NOTE — Progress Notes (Signed)
Subjective:    Patient ID: Tracy Morales, female    DOB: [birth year], 85 y.o.   MRN: [id number]  This visit occurred during the SARS-CoV-2 public health emergency.  Safety protocols were in place, including screening questions prior to the visit, additional usage of staff PPE, and extensive cleaning of exam room while observing appropriate contact time as indicated for disinfecting solutions.    HPI  Here for amw as well as health mt exam/rev of chronic medical problems  I have personally reviewed the Medicare Annual Wellness questionnaire and have noted 1. The patient's medical and social history 2. Their use of alcohol, tobacco or illicit drugs 3. Their current medications and supplements 4. The patient's functional ability including ADL's, fall risks, home safety risks and hearing or visual             impairment. 5. Diet and physical activities 6. Evidence for depression or mood disorders  The patients weight, height, BMI have been recorded in the chart and visual acuity is per eye clinic.  I have made referrals, counseling and provided education to the patient based review of the above and I have provided the pt with a written personalized care plan for preventive services. Reviewed and updated provider list, see scanned forms.  See scanned forms.  Routine anticipatory guidance given to patient.  See health maintenance. She is 85 years old Colon cancer screening- out aged  Breast cancer screening -declines at her age Self breast exam-no lump  Flu vaccine 9/20 Tetanus vaccine 5/13 Tdap Pneumovax up to date  Zoster vaccine-had both shingrix vaccines  Dexa 2/01 - she declines  Falls- she is prone to falls (last fall in august)-no injuries  If she forgets walker- that is a problem/ uses it full time  No falls while using walker  Bathroom has things to hold on too- too small for the walker - she has seat on commode and grab bars as well as bench in the shower  Fractures-none    Supplements-ca and D  Exercise - she is up and around at home some/mostly sitting and rocking most of the time   Does not pick up feet well to walk OA knees   Advance directive-needs to work on  Cognitive function addressed- see scanned forms- and if abnormal then additional documentation follows.  Sometimes memory is "bad" Family notices  Short term memory- date/ time / to eat  Declines medication for memory  Has once left thing on the stove  (does not like microwave)   Family has to watch her closely  Gets 3 hours sleep at a time / gets up to urinate occasionally /has bedside commode   PMH and SH reviewed  Meds, vitals, and allergies reviewed.   ROS: See HPI.  Otherwise negative.    Weight : Wt Readings from Last 3 Encounters:  02/14/19 130 lb (59 kg)  05/09/18 127 lb 8 oz (57.8 kg)  05/06/18 129 lb 4 oz (58.6 kg)  went up last time  Did not weigh today  Appetite is not as good as in the past  Some snacking      Hearing/vision:  Hearing Screening   125Hz  250Hz  500Hz  1000Hz  2000Hz  3000Hz  4000Hz  6000Hz  8000Hz   Right ear:           Left ear:           Comments: Pt wears hearing aids  Vision Screening Comments: Dec 2019 Dr. Dion BodyBulakowski [facility])   Hypertension  bp is  stable today  No cp or palpitations or headaches or edema  No side effects to medicines  BP Readings from Last 3 Encounters:  02/14/19 (!) 168/82  05/09/18 134/74  05/06/18 (!) 150/84      H/o PAD and diastolic dysfunction    periph neuropathy- no changes  Rubs them and that helps   Mild cognitive impairment - is worsening (see above) Declines medications   CKD Lab Results  Component Value Date   CREATININE 1.49 (H) 05/12/2019   BUN 27 (H) 05/12/2019   NA 140 05/12/2019   K 4.0 05/12/2019   CL 104 05/12/2019   CO2 28 05/12/2019   Stable - no changes No symptoms Has to be reminded to drink water/also some flavored water  Has seen renal in the past  Now monitoring    Hyperlipidemia Lab Results  Component Value Date   CHOL 167 05/12/2019   CHOL 160 05/06/2018   CHOL 157 05/02/2017   Lab Results  Component Value Date   HDL 40.20 05/12/2019   HDL 37.60 (L) 05/06/2018   HDL 35.50 (L) 05/02/2017   Lab Results  Component Value Date   LDLCALC 113 (H) 05/12/2019   LDLCALC 107 (H) 05/06/2018   LDLCALC 109 (H) 05/02/2017   Lab Results  Component Value Date   TRIG 68.0 05/12/2019   TRIG 73.0 05/06/2018   TRIG 62.0 05/02/2017   Lab Results  Component Value Date   CHOLHDL 4 05/12/2019   CHOLHDL 4 05/06/2018   CHOLHDL 4 05/02/2017   No results found for: LDLDIRECT  Simvastatin and diet  Does not limit diet- needs to eat more    h/o elevated glucose Lab Results  Component Value Date   HGBA1C 6.0 05/12/2019   She does not eat a lot of sweets   Lab Results  Component Value Date   TSH 3.05 05/12/2019   Lab Results  Component Value Date   ALT 10 05/12/2019   AST 24 05/12/2019   ALKPHOS 92 05/12/2019   BILITOT 0.5 05/12/2019    Lab Results  Component Value Date   WBC 3.7 (L) 05/12/2019   HGB 13.0 05/12/2019   HCT 39.6 05/12/2019   MCV 89.9 05/12/2019   PLT 218.0 05/12/2019    Patient Active Problem List   Diagnosis Date Noted  . Elevated random blood glucose level 05/11/2019  . Pain due to onychomycosis of toenails of both feet 12/04/2018  . Recurrent UTI 09/22/2018  . Low back pain 09/15/2018  . Dysuria 09/15/2018  . MCI (mild cognitive impairment) 05/11/2018  . Frequent urination 02/28/2018  . Mobility impaired 05/06/2017  . Osteoarthritis of both hips 04/15/2015  . Hip pain 04/14/2015  . Routine general medical examination at a health care facility 04/12/2015  . Hair loss 11/19/2014  . Loss of weight 06/09/2014  . Arthritis of right shoulder region 06/09/2014  . Nodule of left lung 06/09/2014  . Encounter for Medicare annual wellness exam 10/30/2013  . H/O fall 04/27/2013  . Risk for falls 04/11/2012  . Leg  pain, bilateral 01/29/2012  . MICROSCOPIC HEMATURIA 08/29/2009  . CONDUCTIVE HEARING LOSS BILATERAL 08/08/2009  . EDEMA 04/07/2009  . BRADYCARDIA 10/26/2008  . DYSPNEA 10/26/2008  . INCONTINENCE, URGE 02/27/2008  . Hyperlipidemia 10/11/2006  . PERIPHERAL NEUROPATHY, LOWER EXTREMITIES, BILATERAL 10/11/2006  . Essential hypertension 10/11/2006  . DIASTOLIC DYSFUNCTION 10/11/2006  . CAROTID STENOSIS 10/11/2006  . CEREBROVASCULAR DISEASE 10/11/2006  . PERIPHERAL VASCULAR DISEASE 10/11/2006  . ALLERGIC RHINITIS 10/11/2006  . Chronic  kidney disease 10/11/2006  . OVERACTIVE BLADDER 10/11/2006  . OSTEOARTHRITIS 10/11/2006  . CARDIAC MURMUR, HX OF 10/11/2006   Past Medical History:  Diagnosis Date  . Allergic rhinitis, cause unspecified   . Bradycardia   . Cerebrovascular disease, unspecified   . Chronic kidney disease, unspecified    chronic renal insufficiency  . Dizziness and giddiness   . DVT (deep venous thrombosis) (HCC)    R  . Dyspnea    echo with normal EF. +diastolic dysfunction  . 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 HTN (hypertension)   . Hyperlipidemia   . Hypertonicity of bladder    hx  . Mild depression (HCC)   . Mononeuritis of lower limb, unspecified    peripheral neuropathy, bilateral  . Occlusion and stenosis of carotid artery without mention of cerebral infarction 4/09   u/s L 40-59%, R 0-39%; stable for many years  . Osteoarthrosis, unspecified whether generalized or localized, unspecified site   . Peripheral vascular disease, unspecified (HCC)   . Polymyalgia rheumatica (HCC)    hx  . RUQ pain   . Urge incontinence    Past Surgical History:  Procedure Laterality Date  . 2D echo  8/06   mild aortic stenosis.   Marland Kitchen arterial doppler    . carotid doppler  4/02   bilateral plaque   . carotid doppler  8/06   non significant stenosis  . carotid doppler     R 40%; L 40-60%  . CATARACT EXTRACTION    . CHOLECYSTECTOMY  2000  . COLONOSCOPY   1/04   polyps  . CT SCAN  05/25/08   abd and pelvis- inflated urinary bladder and large amt stool throughout w/o blockage   . dexa  2/01   osteopenia  . echo     mild LVH EF 50% As  . triger finger contracture  6/04  . vert. basilar insufficiency  2002  . zoster  5/02   Social History   Tobacco Use  . Smoking status: Never Smoker  . Smokeless tobacco: Never Used  . Tobacco comment: non smoker   Substance Use Topics  . Alcohol use: No    Alcohol/week: 0.0 standard drinks  . Drug use: No   Family History  Problem Relation Age of Onset  . Hypertension Father   . Other Father        Cardiac problems  . Heart disease Father   . Other Brother        blood clot  . Other Sister        benign breast nodule   Allergies  Allergen Reactions  . Ace Inhibitors     REACTION: cough   Current Outpatient Medications on File Prior to Visit  Medication Sig Dispense Refill  . acetaminophen (TYLENOL) 500 MG tablet Take 500 mg by mouth every 6 (six) hours as needed. OTC-UAD     . aspirin 325 MG EC tablet Take 325 mg by mouth daily.    . Cholecalciferol (VITAMIN D3) 2000 units capsule Take 2,000 Units by mouth daily.    . ferrous sulfate 325 (65 FE) MG tablet Take 1 tablet (325 mg total) by mouth 2 (two) times daily. 60 tablet 11  . Meclizine HCl (BONINE PO) Take by mouth as needed.    . Multiple Vitamin (MULTIVITAMIN) tablet Take 1 tablet by mouth daily.    . NON FORMULARY Walker with wheels - for use with ambulation once daily. 429.9, 715.90, 355.9     .  psyllium (METAMUCIL) 58.6 % packet Take 1 packet by mouth daily.     No current facility-administered medications on file prior to visit.    Review of Systems  Constitutional: Positive for fatigue. Negative for activity change, appetite change, fever and unexpected weight change.  HENT: Negative for congestion, ear pain, rhinorrhea, sinus pressure and sore throat.   Eyes: Negative for pain, redness and visual disturbance.   Respiratory: Negative for cough, shortness of breath and wheezing.   Cardiovascular: Negative for chest pain and palpitations.  Gastrointestinal: Negative for abdominal pain, blood in stool, constipation and diarrhea.  Endocrine: Negative for polydipsia and polyuria.  Genitourinary: Negative for dysuria, frequency and urgency.  Musculoskeletal: Positive for arthralgias. Negative for back pain and myalgias.  Skin: Negative for pallor and rash.  Allergic/Immunologic: Negative for environmental allergies.  Neurological: Negative for dizziness, syncope and headaches.  Hematological: Negative for adenopathy. Does not bruise/bleed easily.  Psychiatric/Behavioral: Negative for decreased concentration and dysphoric mood. The patient is not nervous/anxious.        Objective:   Physical Exam Constitutional:      General: She is not in acute distress.    Appearance: Normal appearance. She is well-developed and normal weight. She is not ill-appearing or diaphoretic.  HENT:     Head: Normocephalic and atraumatic.     Right Ear: Tympanic membrane, ear canal and external ear normal.     Left Ear: Tympanic membrane, ear canal and external ear normal.     Nose: Nose normal. No congestion.     Mouth/Throat:     Mouth: Mucous membranes are moist.     Pharynx: Oropharynx is clear. No posterior oropharyngeal erythema.  Eyes:     General: No scleral icterus.    Extraocular Movements: Extraocular movements intact.     Conjunctiva/sclera: Conjunctivae normal.     Pupils: Pupils are equal, round, and reactive to light.  Neck:     Thyroid: No thyromegaly.     Vascular: No carotid bruit or JVD.  Cardiovascular:     Rate and Rhythm: Normal rate and regular rhythm.     Pulses: Normal pulses.     Heart sounds: Normal heart sounds. No gallop.   Pulmonary:     Effort: Pulmonary effort is normal. No respiratory distress.     Breath sounds: Normal breath sounds. No wheezing.     Comments: Good air  exch Chest:     Chest wall: No tenderness.  Abdominal:     General: Bowel sounds are normal. There is no distension or abdominal bruit.     Palpations: Abdomen is soft. There is no mass.     Tenderness: There is no abdominal tenderness.     Hernia: No hernia is present.  Genitourinary:    Comments: Breast exam: No mass, nodules, thickening, tenderness, bulging, retraction, inflamation, nipple discharge or skin changes noted.  No axillary or clavicular LA.     Musculoskeletal:        General: No tenderness. Normal range of motion.     Cervical back: Normal range of motion and neck supple. No rigidity. No muscular tenderness.     Right lower leg: No edema.     Left lower leg: No edema.     Comments: Kyphosis noted -mild  OA changes in hands  Lymphadenopathy:     Cervical: No cervical adenopathy.  Skin:    General: Skin is warm and dry.     Coloration: Skin is not pale.     Findings:  No erythema or rash.     Comments: Some sks and skin tags   Neurological:     Mental Status: She is alert. Mental status is at baseline.     Cranial Nerves: No cranial nerve deficit.     Motor: No abnormal muscle tone.     Coordination: Coordination normal.     Gait: Gait normal.     Deep Tendon Reflexes: Reflexes are normal and symmetric. Reflexes normal.  Psychiatric:        Mood and Affect: Mood normal.     Comments: Fairly mentally sharp  Pleasant            Assessment & Plan:   Problem List Items Addressed This Visit      Cardiovascular and Mediastinum   Essential hypertension    bp in fair control at this time  BP Readings from Last 1 Encounters:  05/15/19 138/78   No changes needed Most recent labs reviewed  Disc lifstyle change with low sodium diet and exercise        Relevant Medications   simvastatin (ZOCOR) 20 MG tablet   amLODipine (NORVASC) 5 MG tablet   PERIPHERAL VASCULAR DISEASE    No symptoms or clinical changes  Feet are warm and well perfused        Relevant Medications   simvastatin (ZOCOR) 20 MG tablet   amLODipine (NORVASC) 5 MG tablet     Genitourinary   Chronic kidney disease    Stable renal labs Reminded to work on fluid intakes  No longer sees renal as long as stable        Other   Hyperlipidemia    Disc goals for lipids and reasons to control them Rev last labs with pt Rev low sat fat diet in detail  Stable with simvastatin and diet      Relevant Medications   simvastatin (ZOCOR) 20 MG tablet   amLODipine (NORVASC) 5 MG tablet   Risk for falls    Fall prevention again discussed  Uses walker full time      Encounter for Medicare annual wellness exam - Primary    Reviewed health habits including diet and exercise and skin cancer prevention Reviewed appropriate screening tests for age  Also reviewed health mt list, fam hx and immunization status , as well as social and family history   See HPI Labs reviewed  Declines colon , breast screening due to age  Declines dexa (no fractures, disc fall prev in home) -does take ca and D Given materials to work on Scientist, product/process development and disc imp of this  Some age related short term memory loss - disc imp of observation and safety w/in the home  Wears hearing aides and utd eye/vision exam w/o complaints       Routine general medical examination at a health care facility    Reviewed health habits including diet and exercise and skin cancer prevention Reviewed appropriate screening tests for age  Also reviewed health mt list, fam hx and immunization status , as well as social and family history   See HPI Labs reviewed  Declines colon , breast screening due to age  Declines dexa (no fractures, disc fall prev in home) -does take ca and D Given materials to work on Scientist, product/process development and disc imp of this  Some age related short term memory loss - disc imp of observation and safety w/in the home  Wears hearing aides and utd eye/vision exam w/o complaints  MCI (mild  cognitive impairment)    Age related with some progression  Quite mentally sharp today Family watches closely to prevent accidents        Elevated random blood glucose level    Lab Results  Component Value Date   HGBA1C 6.0 05/12/2019   disc imp of low glycemic diet and wt loss to prevent DM2

## 2019-05-15 NOTE — Patient Instructions (Addendum)
Please work on [employer] for advance directive (the blue packet) Get [place] a copy when you are done   Stay as active as safely as possible Always use your walker   Supervise stove and kitchen work to prevent accidents  No change in medicines Labs are stable

## 2019-05-17 NOTE — Assessment & Plan Note (Signed)
Reviewed health habits including diet and exercise and skin cancer prevention Reviewed appropriate screening tests for age  Also reviewed health mt list, fam hx and immunization status , as well as social and family history   See HPI Labs reviewed  Declines colon , breast screening due to age  Declines dexa (no fractures, disc fall prev in home) -does take ca and D Given materials to work on adv directive and disc imp of this  Some age related short term memory loss - disc imp of observation and safety w/in the home  Wears hearing aides and utd eye/vision exam w/o complaints  

## 2019-05-17 NOTE — Assessment & Plan Note (Signed)
Fall prevention again discussed  Uses walker full time

## 2019-05-17 NOTE — Assessment & Plan Note (Signed)
Lab Results  Component Value Date   HGBA1C 6.0 05/12/2019   disc imp of low glycemic diet and wt loss to prevent DM2

## 2019-05-17 NOTE — Assessment & Plan Note (Signed)
Age related with some progression  Quite mentally sharp today Family watches closely to prevent accidents

## 2019-05-17 NOTE — Assessment & Plan Note (Signed)
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

## 2019-05-17 NOTE — Assessment & Plan Note (Signed)
Disc goals for lipids and reasons to control them Rev last labs with pt Rev low sat fat diet in detail Stable with simvastatin and diet   

## 2019-05-17 NOTE — Assessment & Plan Note (Signed)
Stable renal labs Reminded to work on fluid intakes  No longer sees renal as long as stable

## 2019-05-17 NOTE — Assessment & Plan Note (Signed)
No symptoms or clinical changes  Feet are warm and well perfused

## 2019-05-17 NOTE — Assessment & Plan Note (Signed)
bp in fair control at this time  BP Readings from Last 1 Encounters:  05/15/19 138/78   No changes needed Most recent labs reviewed  Disc lifstyle change with low sodium diet and exercise

## 2019-06-15 ENCOUNTER — Encounter: Payer: Self-pay | Admitting: Podiatry

## 2019-06-15 ENCOUNTER — Other Ambulatory Visit: Payer: Self-pay

## 2019-06-15 ENCOUNTER — Ambulatory Visit: Payer: Medicare PPO | Admitting: Podiatry

## 2019-06-15 DIAGNOSIS — B351 Tinea unguium: Secondary | ICD-10-CM | POA: Diagnosis not present

## 2019-06-15 DIAGNOSIS — M79675 Pain in left toe(s): Secondary | ICD-10-CM | POA: Diagnosis not present

## 2019-06-15 DIAGNOSIS — E1149 Type 2 diabetes mellitus with other diabetic neurological complication: Secondary | ICD-10-CM

## 2019-06-15 DIAGNOSIS — M79674 Pain in right toe(s): Secondary | ICD-10-CM | POA: Diagnosis not present

## 2019-06-15 DIAGNOSIS — Q828 Other specified congenital malformations of skin: Secondary | ICD-10-CM

## 2019-06-15 NOTE — Progress Notes (Signed)
Complaint:  Visit Type: Patient returns to my office for continued preventative foot care services. Complaint: Patient states" my nails have grown long and thick and become painful to walk and wear shoes" . The patient presents for preventative foot care services. No changes to ROS  Podiatric Exam: Vascular: dorsalis pedis and posterior tibial pulses are palpable bilateral. Capillary return is immediate. Temperature gradient is WNL. Skin turgor WNL  Sensorium: Diminished  Semmes Weinstein monofilament test. Normal tactile sensation bilaterally. Nail Exam: Pt has thick disfigured discolored nails with subungual debris noted bilateral entire nail hallux through fifth toenails Ulcer Exam: There is no evidence of ulcer or pre-ulcerative changes or infection. Orthopedic Exam: Muscle tone and strength are WNL. No limitations in general ROM. No crepitus or effusions noted. Foot type and digits show no abnormalities.  Severe HAV  B/L with hammer toes.  Pes planus  B/L. Skin:  Porokeratosis sub 1 right foot Symptomatic. No infection or ulcers  Diagnosis:  Onychomycosis, , Pain in right toe, pain in left toes,  Porokeratosis right foot.  Treatment & Plan Procedures and Treatment: Consent by patient was obtained for treatment procedures. The patient understood the discussion of treatment and procedures well. All questions were answered thoroughly reviewed. Debridement of mycotic and hypertrophic toenails, 1 through 5 bilateral and clearing of subungual debris. No ulceration, no infection noted.  Debride porokeratosis sub 1 right. Return Visit-Office Procedure: Patient instructed to return to the office for a follow up visit 10 weeks  for continued evaluation and treatment.    Helane Gunther DPM

## 2019-06-24 ENCOUNTER — Other Ambulatory Visit: Payer: Self-pay | Admitting: Family Medicine

## 2019-06-29 DIAGNOSIS — R32 Unspecified urinary incontinence: Secondary | ICD-10-CM | POA: Diagnosis not present

## 2019-06-29 DIAGNOSIS — R3914 Feeling of incomplete bladder emptying: Secondary | ICD-10-CM | POA: Diagnosis not present

## 2019-06-29 DIAGNOSIS — R339 Retention of urine, unspecified: Secondary | ICD-10-CM | POA: Diagnosis not present

## 2019-06-29 DIAGNOSIS — N3941 Urge incontinence: Secondary | ICD-10-CM | POA: Diagnosis not present

## 2019-07-19 ENCOUNTER — Ambulatory Visit: Payer: Medicare PPO | Attending: Internal Medicine

## 2019-07-19 DIAGNOSIS — Z23 Encounter for immunization: Secondary | ICD-10-CM

## 2019-07-19 NOTE — Progress Notes (Signed)
   Covid-19 Vaccination Clinic  Name:  Tracy Morales    MRN: [id number] DOB: Feb 04, 1920  07/19/2019  [name] was observed post Covid-19 immunization for 15 minutes without incidence. She was provided with Vaccine Information Sheet and instruction to access the V-Safe system.   [name] was instructed to call 911 with any severe reactions post vaccine: Marland Kitchen Difficulty breathing  . Swelling of your face and throat  . A fast heartbeat  . A bad rash all over your body  . Dizziness and weakness    Immunizations Administered    Name Date Dose VIS Date Route   Pfizer COVID-19 Vaccine 07/19/2019 12:11 PM 0.3 mL 05/15/2019 Intramuscular   Manufacturer: [employer], [employer]   Lot: [id number]   NDC: [address]-7

## 2019-07-28 DIAGNOSIS — R3914 Feeling of incomplete bladder emptying: Secondary | ICD-10-CM | POA: Diagnosis not present

## 2019-07-28 DIAGNOSIS — R32 Unspecified urinary incontinence: Secondary | ICD-10-CM | POA: Diagnosis not present

## 2019-07-28 DIAGNOSIS — N3941 Urge incontinence: Secondary | ICD-10-CM | POA: Diagnosis not present

## 2019-07-28 DIAGNOSIS — R339 Retention of urine, unspecified: Secondary | ICD-10-CM | POA: Diagnosis not present

## 2019-08-18 ENCOUNTER — Ambulatory Visit: Payer: Medicare PPO | Attending: Internal Medicine

## 2019-08-18 DIAGNOSIS — Z23 Encounter for immunization: Secondary | ICD-10-CM

## 2019-08-18 NOTE — Progress Notes (Signed)
   Covid-19 Vaccination Clinic  Name:  CAYLE CORDOBA    MRN: [id number] DOB: [birth year]  08/18/2019  Ms. Kreiger was observed post Covid-19 immunization for 15 minutes without incident. She was provided with Vaccine Information Sheet and instruction to access the V-Safe system.   Ms. Rohrer was instructed to call 911 with any severe reactions post vaccine: Marland Kitchen Difficulty breathing  . Swelling of face and throat  . A fast heartbeat  . A bad rash all over body  . Dizziness and weakness   Immunizations Administered    Name Date Dose VIS Date Route   Pfizer COVID-19 Vaccine 08/18/2019  8:12 AM 0.3 mL 05/15/2019 Intramuscular   Manufacturer: [employer], [employer]   Lot: [id number]   NDC: [address]-5    `

## 2019-08-19 DIAGNOSIS — N3040 Irradiation cystitis without hematuria: Secondary | ICD-10-CM | POA: Diagnosis not present

## 2019-08-19 DIAGNOSIS — R3914 Feeling of incomplete bladder emptying: Secondary | ICD-10-CM | POA: Diagnosis not present

## 2019-08-19 DIAGNOSIS — N3021 Other chronic cystitis with hematuria: Secondary | ICD-10-CM | POA: Diagnosis not present

## 2019-08-19 DIAGNOSIS — N390 Urinary tract infection, site not specified: Secondary | ICD-10-CM | POA: Diagnosis not present

## 2019-08-24 ENCOUNTER — Other Ambulatory Visit: Payer: Self-pay

## 2019-08-24 ENCOUNTER — Encounter: Payer: Self-pay | Admitting: Podiatry

## 2019-08-24 ENCOUNTER — Ambulatory Visit: Payer: Medicare PPO | Admitting: Podiatry

## 2019-08-24 VITALS — Temp 97.6°F

## 2019-08-24 DIAGNOSIS — M79675 Pain in left toe(s): Secondary | ICD-10-CM

## 2019-08-24 DIAGNOSIS — R32 Unspecified urinary incontinence: Secondary | ICD-10-CM | POA: Diagnosis not present

## 2019-08-24 DIAGNOSIS — B351 Tinea unguium: Secondary | ICD-10-CM

## 2019-08-24 DIAGNOSIS — R3914 Feeling of incomplete bladder emptying: Secondary | ICD-10-CM | POA: Diagnosis not present

## 2019-08-24 DIAGNOSIS — M79674 Pain in right toe(s): Secondary | ICD-10-CM

## 2019-08-24 DIAGNOSIS — N3941 Urge incontinence: Secondary | ICD-10-CM | POA: Diagnosis not present

## 2019-08-24 DIAGNOSIS — N189 Chronic kidney disease, unspecified: Secondary | ICD-10-CM | POA: Diagnosis not present

## 2019-08-24 DIAGNOSIS — R339 Retention of urine, unspecified: Secondary | ICD-10-CM | POA: Diagnosis not present

## 2019-08-24 NOTE — Progress Notes (Addendum)
This patient returns to my office for at risk foot care.  This patient requires this care by a professional since this patient will be at risk due to having chronic kidney disease and neuropathy.  Patient presents to the office with her daughter. This patient is unable to cut nails himself since the patient cannot reach his nails.These nails are painful walking and wearing shoes.  This patient presents for at risk foot care today.  General Appearance  Alert, conversant and in no acute stress.  Vascular  Dorsalis pedis and posterior tibial  pulses are palpable  bilaterally.  Capillary return is within normal limits  bilaterally. Temperature is within normal limits  bilaterally.  Neurologic  Senn-Weinstein monofilament wire test within normal limits  bilaterally. Muscle power within normal limits bilaterally.  Nails Thick disfigured discolored nails with subungual debris  from hallux to fifth toes bilaterally. No evidence of bacterial infection or drainage bilaterally.  Orthopedic  No limitations of motion  feet .  No crepitus or effusions noted.  No bony pathology or digital deformities noted.  Skin  normotropic skin with no porokeratosis noted bilaterally.  No signs of infections or ulcers noted.     Onychomycosis  Pain in right toes  Pain in left toes  Consent was obtained for treatment procedures.   Mechanical debridement of nails 1-5  bilaterally performed with a nail nipper.  Filed with dremel without incident. No infection or ulcer.  Padding dispensed to cover both big toes.  Return office visit    10 weeks                  Told patient to return for periodic foot care and evaluation due to potential at risk complications.   Helane Gunther DPM

## 2019-08-27 ENCOUNTER — Telehealth: Payer: Self-pay

## 2019-08-27 DIAGNOSIS — L219 Seborrheic dermatitis, unspecified: Secondary | ICD-10-CM | POA: Insufficient documentation

## 2019-08-27 NOTE — Telephone Encounter (Signed)
[name], patient's daughter, called stating that when patient saw Dr Milinda Antis last time they discussed patinet's dandruf and dicussed different shampoo options to try. [name] states they have tried different things and problem is not getting any better. Wanted to see if Dr Milinda Antis recommended proceeding with referral to dermatologist at this time? [name]'s CB is [phone].

## 2019-08-27 NOTE — Telephone Encounter (Signed)
Referral done  Routed to [facility]- let me know if ref does not go there

## 2019-09-02 ENCOUNTER — Telehealth: Payer: Self-pay | Admitting: Family Medicine

## 2019-09-02 NOTE — Telephone Encounter (Signed)
Thanks for letting me know-please keep me posted

## 2019-09-02 NOTE — Telephone Encounter (Signed)
[name] called today in regards to the patient She stated that the patient has been very shaky recently and some dizziness. She said that urology had prescribed the patient a medication. From what [name] stated that it was for only seven days so she thought that should already be out of her system since it was prescribed earlier in the month. She stated ever since she started taking the medication the dizziness/shaky started. Spoke with Sydell Axon and we advised [name] she needed to contact Urology about the medication. She said she did and never heard back from anyone. We advised to try them again until she talks to someone because this could be caused by the medication they prescribed and they may need to adjust/change the medication. [name] voiced understanding and will Continue to reach out to them .   FYI

## 2019-09-09 ENCOUNTER — Other Ambulatory Visit: Payer: Self-pay

## 2019-09-09 ENCOUNTER — Encounter: Payer: Self-pay | Admitting: Family Medicine

## 2019-09-09 ENCOUNTER — Ambulatory Visit: Payer: Medicare PPO | Admitting: Family Medicine

## 2019-09-09 VITALS — BP 156/64 | HR 75 | Temp 96.9°F | Ht 64.0 in | Wt 127.4 lb

## 2019-09-09 DIAGNOSIS — I10 Essential (primary) hypertension: Secondary | ICD-10-CM | POA: Diagnosis not present

## 2019-09-09 DIAGNOSIS — N189 Chronic kidney disease, unspecified: Secondary | ICD-10-CM | POA: Diagnosis not present

## 2019-09-09 DIAGNOSIS — N390 Urinary tract infection, site not specified: Secondary | ICD-10-CM

## 2019-09-09 DIAGNOSIS — R519 Headache, unspecified: Secondary | ICD-10-CM | POA: Diagnosis not present

## 2019-09-09 DIAGNOSIS — I739 Peripheral vascular disease, unspecified: Secondary | ICD-10-CM

## 2019-09-09 DIAGNOSIS — R42 Dizziness and giddiness: Secondary | ICD-10-CM

## 2019-09-09 LAB — CBC WITH DIFFERENTIAL/PLATELET
Basophils Absolute: 0 10*3/uL (ref 0.0–0.1)
Basophils Relative: 0.6 % (ref 0.0–3.0)
Eosinophils Absolute: 0.1 10*3/uL (ref 0.0–0.7)
Eosinophils Relative: 1.9 % (ref 0.0–5.0)
HCT: 40.2 % (ref 36.0–46.0)
Hemoglobin: 13.4 g/dL (ref 12.0–15.0)
Lymphocytes Relative: 39.3 % (ref 12.0–46.0)
Lymphs Abs: 1.4 10*3/uL (ref 0.7–4.0)
MCHC: 33.3 g/dL (ref 30.0–36.0)
MCV: 90.3 fl (ref 78.0–100.0)
Monocytes Absolute: 0.6 10*3/uL (ref 0.1–1.0)
Monocytes Relative: 15.2 % — ABNORMAL HIGH (ref 3.0–12.0)
Neutro Abs: 1.6 10*3/uL (ref 1.4–7.7)
Neutrophils Relative %: 43 % (ref 43.0–77.0)
Platelets: 224 10*3/uL (ref 150.0–400.0)
RBC: 4.46 Mil/uL (ref 3.87–5.11)
RDW: 14.8 % (ref 11.5–15.5)
WBC: 3.7 10*3/uL — ABNORMAL LOW (ref 4.0–10.5)

## 2019-09-09 LAB — RENAL FUNCTION PANEL
Albumin: 3.8 g/dL (ref 3.5–5.2)
BUN: 31 mg/dL — ABNORMAL HIGH (ref 6–23)
CO2: 31 mEq/L (ref 19–32)
Calcium: 9.4 mg/dL (ref 8.4–10.5)
Chloride: 101 mEq/L (ref 96–112)
Creatinine, Ser: 1.28 mg/dL — ABNORMAL HIGH (ref 0.40–1.20)
GFR: 46.59 mL/min — ABNORMAL LOW (ref >60.00)
Glucose, Bld: 99 mg/dL (ref 70–99)
Phosphorus: 3.5 mg/dL (ref 2.3–4.6)
Potassium: 4.3 mEq/L (ref 3.5–5.1)
Sodium: 137 mEq/L (ref 135–145)

## 2019-09-09 NOTE — Assessment & Plan Note (Signed)
No clinical changes 

## 2019-09-09 NOTE — Assessment & Plan Note (Signed)
Labs today  Recent bout of dizziness and headache when tx for uti by urology with bactrim (better now)  Enc better fluid intake

## 2019-09-09 NOTE — Assessment & Plan Note (Signed)
Last tx with bactrim by urology (Dr Sheppard Penton) and symptoms are better/urine clear  She had dizziness/ha on med-now resolved Lab for renal panel and cbc today  Enc better fluid intake Reassuring exam

## 2019-09-09 NOTE — Patient Instructions (Addendum)
Try to encourage fluids as much as possible - dehydration can cause headache and dizziness   The bactrim may have also given her side effects since now she is improving   Let's check kidney labs today   If you think mirtazapine does not help mood/ sleep or appetite we can stop it   If symptoms do not continue to improve please let me know

## 2019-09-09 NOTE — Assessment & Plan Note (Signed)
Pt had positional dizziness with recent bactrim tx (also HA) Better now  Disc poss side eff of above or dehydration with poor fluid intake in setting of CKD Enc fluids Lab today  inst to call if symptoms return

## 2019-09-09 NOTE — Assessment & Plan Note (Signed)
bp is high today-forgot her medication  inst to take it on return home

## 2019-09-09 NOTE — Progress Notes (Signed)
Subjective:    Patient ID: Tracy DerryAnnie S Canela, female    DOB: April 16, 1920, 86 y.o.   MRN: [id number]  This visit occurred during the SARS-CoV-2 public health emergency.  Safety protocols were in place, including screening questions prior to the visit, additional usage of staff PPE, and extensive cleaning of exam room while observing appropriate contact time as indicated for disinfecting solutions.    HPI Pt presents with c/o dizziness and headache after starting abx from urology  Wt Readings from Last 3 Encounters:  09/09/19 127 lb 7 oz (57.8 kg)  05/15/19 127 lb 5 oz (57.7 kg)  02/14/19 130 lb (59 kg)   21.87 kg/m   H/o recurrent utis in setting of CKD in pt who self catheterizes  Sees Dr Sheppard PentonWolf urology  Dizzy with headache -  Started around 3/20 to about last Wednesday  Happens when she moves  No nausea at all   Caretaker thought it may be inner ear - but meclizine helped  Had nl bm -no constipation or diarrhea   Noted last year she had to go to ER for headache  02/14/19  Had CTA-not concerning  She improved in ED with tylenol and was d/c thinking it was msk in origin   CTA report:  CT Angio Head W or Wo Contrast (Accession [id number]) (Order [id number]) Imaging Date: 02/14/2019 Department: [facility] EMERGENCY DEPARTMENT Released By/Authorizing: Minna AntisPaduchowski, Kevin, MD (auto-released)  Exam Status  Status  Final [99]  PACS Intelerad Image Link  Show images for CT Angio Head W or Wo Contrast  Study Result  CLINICAL DATA:  Headache  EXAM: CT ANGIOGRAPHY HEAD AND NECK  TECHNIQUE: Multidetector CT imaging of the head and neck was performed using the standard protocol during bolus administration of intravenous contrast. Multiplanar CT image reconstructions and MIPs were obtained to evaluate the vascular anatomy. Carotid stenosis measurements (when applicable) are obtained utilizing NASCET criteria, using the distal internal carotid diameter as  the denominator.  CONTRAST:  60mL OMNIPAQUE IOHEXOL 350 MG/ML SOLN  COMPARISON:  Head CT earlier today  FINDINGS: CTA NECK FINDINGS  Aortic arch: Extensive atherosclerosis including irregular luminal plaque at the distal ascending segment. 2 vessel branching.  Right carotid system: Moderate plaque at the bifurcation without flow limiting stenosis or ulceration.  Left carotid system: Partial retropharyngeal course. Moderate atheromatous plaque at the bifurcation without flow limiting stenosis or ulceration. Mild atherosclerotic wall thickening of the common carotid.  Vertebral arteries: No proximal subclavian flow limiting stenosis. Mild narrowing at the origin of the right vertebral artery. Moderate narrowing at the origin of the left vertebral artery. Very tortuous vertebral arteries without dissection or flow limiting stenosis. Undulating appearance of the lumen at the right the 2/3 segment is associated with deformity by vertebral osteophytes. No generalized changes of convincing fibromuscular dysplasia.  Skeleton: Advanced degenerative disease with spurring.  Other neck: No acute finding.  Upper chest: Left apical cluster of thin walled cysts with a fibrotic appearance and volume loss.  Review of the MIP images confirms the above findings  CTA HEAD FINDINGS  Anterior circulation: Patulous cervical carotids with fusiform shaped aneurysmal enlargement of the right cavernous segment measuring 11 mm diameter. At the same level is a laterally directed saccular outpouching measuring 2.5 mm. Intracranial vessels are tortuous, likely chronic hypertension. No branch occlusion. No flow limiting stenosis.  Posterior circulation: Vertebral arteries are smooth and widely patent. Moderate distal basilar stenosis. Atheromatous irregularity of bilateral posterior cerebral arteries. Negative for aneurysm or branch  occlusion  Venous sinuses: Negative  Anatomic  variants: None significant  Review of the MIP images confirms the above findings  IMPRESSION: 1. No emergent finding. 2. 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 No evident intradural aneurysm. 3. Moderate atheromatous narrowing of the distal basilar artery. 4. Moderate narrowing at the left vertebral origin.   Electronically Signed   By: Monte Fantasia M.D.   On: 02/14/2019 08:57      She called Dr Samuel Germany office to let them know this may be a side effect of the bactrim  Treating a uti -was on it bid for 7 day   Last dose was on 3/24  Urologist thinks it was out of her system by now        bp is elevated BP Readings from Last 3 Encounters:  09/09/19 (!) 156/64  05/15/19 138/78  02/14/19 (!) 168/82   Did not take her bp med today so her bp does not represent nl control Pulse Readings from Last 3 Encounters:  09/09/19 75  05/15/19 63  02/14/19 (!) 59   H/o CKD Lab Results  Component Value Date   CREATININE 1.49 (H) 05/12/2019   BUN 27 (H) 05/12/2019   NA 140 05/12/2019   K 4.0 05/12/2019   CL 104 05/12/2019   CO2 28 05/12/2019   Patient Active Problem List   Diagnosis Date Noted  . Headache 09/09/2019  . Dizziness 09/09/2019  . Seborrheic dermatitis of scalp 08/27/2019  . Elevated random blood glucose level 05/11/2019  . Pain due to onychomycosis of toenails of both feet 12/04/2018  . Recurrent UTI 09/22/2018  . Low back pain 09/15/2018  . MCI (mild cognitive impairment) 05/11/2018  . Frequent urination 02/28/2018  . Mobility impaired 05/06/2017  . Osteoarthritis of both hips 04/15/2015  . Hip pain 04/14/2015  . Routine general medical examination at a health care facility 04/12/2015  . Hair loss 11/19/2014  . Loss of weight 06/09/2014  . Arthritis of right shoulder region 06/09/2014  . Nodule of left lung 06/09/2014  . Encounter for Medicare annual wellness exam 10/30/2013  . H/O fall 04/27/2013  . Risk for falls  04/11/2012  . Leg pain, bilateral 01/29/2012  . MICROSCOPIC HEMATURIA 08/29/2009  . CONDUCTIVE HEARING LOSS BILATERAL 08/08/2009  . EDEMA 04/07/2009  . BRADYCARDIA 10/26/2008  . DYSPNEA 10/26/2008  . INCONTINENCE, URGE 02/27/2008  . Hyperlipidemia 10/11/2006  . PERIPHERAL NEUROPATHY, LOWER EXTREMITIES, BILATERAL 10/11/2006  . Essential hypertension 10/11/2006  . DIASTOLIC DYSFUNCTION 57/84/6962  . CAROTID STENOSIS 10/11/2006  . CEREBROVASCULAR DISEASE 10/11/2006  . PERIPHERAL VASCULAR DISEASE 10/11/2006  . ALLERGIC RHINITIS 10/11/2006  . Chronic kidney disease 10/11/2006  . OVERACTIVE BLADDER 10/11/2006  . OSTEOARTHRITIS 10/11/2006  . CARDIAC MURMUR, HX OF 10/11/2006   Past Medical History:  Diagnosis Date  . Allergic rhinitis, cause unspecified   . Bradycardia   . Cerebrovascular disease, unspecified   . Chronic kidney disease, unspecified    chronic renal insufficiency  . Dizziness and giddiness   . DVT (deep venous thrombosis) ([place])    R  . Dyspnea    echo with normal EF. +diastolic dysfunction  . 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 HTN (hypertension)   . Hyperlipidemia   . Hypertonicity of bladder    hx  . Mild depression ([place])   . Mononeuritis of lower limb, unspecified    peripheral neuropathy, bilateral  . Occlusion and stenosis of carotid artery without mention of cerebral infarction  4/09   u/s L 40-59%, R 0-39%; stable for many years  . Osteoarthrosis, unspecified whether generalized or localized, unspecified site   . Peripheral vascular disease, unspecified (HCC)   . Polymyalgia rheumatica (HCC)    hx  . RUQ pain   . Urge incontinence    Past Surgical History:  Procedure Laterality Date  . 2D echo  8/06   mild aortic stenosis.   Marland Kitchen arterial doppler    . carotid doppler  4/02   bilateral plaque   . carotid doppler  8/06   non significant stenosis  . carotid doppler     R 40%; L 40-60%  . CATARACT EXTRACTION    . CHOLECYSTECTOMY  2000   . COLONOSCOPY  1/04   polyps  . CT SCAN  05/25/08   abd and pelvis- inflated urinary bladder and large amt stool throughout w/o blockage   . dexa  2/01   osteopenia  . echo     mild LVH EF 50% As  . triger finger contracture  6/04  . vert. basilar insufficiency  2002  . zoster  5/02   Social History   Tobacco Use  . Smoking status: Never Smoker  . Smokeless tobacco: Never Used  . Tobacco comment: non smoker   Substance Use Topics  . Alcohol use: No    Alcohol/week: 0.0 standard drinks  . Drug use: No   Family History  Problem Relation Age of Onset  . Hypertension Father   . Other Father        Cardiac problems  . Heart disease Father   . Other Brother        blood clot  . Other Sister        benign breast nodule   Allergies  Allergen Reactions  . Ace Inhibitors     REACTION: cough   Current Outpatient Medications on File Prior to Visit  Medication Sig Dispense Refill  . acetaminophen (TYLENOL) 500 MG tablet Take 500 mg by mouth every 6 (six) hours as needed. OTC-UAD     . amLODipine (NORVASC) 5 MG tablet Take 1 tablet (5 mg total) by mouth daily. 30 tablet 11  . aspirin 325 MG EC tablet Take 325 mg by mouth daily.    . Cholecalciferol (VITAMIN D3) 2000 units capsule Take 2,000 Units by mouth daily.    Marland Kitchen CRANBERRY PO Take 1 tablet by mouth daily.    . ferrous sulfate 325 (65 FE) MG tablet Take 1 tablet (325 mg total) by mouth 2 (two) times daily. 60 tablet 11  . Meclizine HCl (BONINE PO) Take by mouth as needed.    . mirtazapine (REMERON) 30 MG tablet TAKE 1 TABLET BY MOUTH AT BEDTIME. 90 tablet 2  . Multiple Vitamin (MULTIVITAMIN) tablet Take 1 tablet by mouth daily.    . NON FORMULARY Walker with wheels - for use with ambulation once daily. 429.9, 715.90, 355.9     . psyllium (METAMUCIL) 58.6 % packet Take 1 packet by mouth daily.    . simvastatin (ZOCOR) 20 MG tablet Take one pill by mouth once daily 30 tablet 11   No current facility-administered medications  on file prior to visit.     Review of Systems  Constitutional: Negative for activity change, appetite change, fatigue, fever and unexpected weight change.       Poor appetite Difficult to enc to drink fluids  HENT: Negative for congestion, ear pain, rhinorrhea, sinus pressure and sore throat.   Eyes: Negative  for pain, redness and visual disturbance.  Respiratory: Negative for cough, shortness of breath and wheezing.   Cardiovascular: Negative for chest pain and palpitations.  Gastrointestinal: Negative for abdominal pain, blood in stool, constipation and diarrhea.  Endocrine: Negative for polydipsia and polyuria.  Genitourinary: Negative for dysuria, frequency and urgency.  Musculoskeletal: Positive for arthralgias. Negative for back pain and myalgias.  Skin: Negative for pallor and rash.  Allergic/Immunologic: Negative for environmental allergies.  Neurological: Negative for dizziness, syncope and headaches.       Dizziness and headache are now resolved No focal weakness  Hematological: Negative for adenopathy. Does not bruise/bleed easily.  Psychiatric/Behavioral: Negative for decreased concentration and dysphoric mood. The patient is not nervous/anxious.        Objective:   Physical Exam Constitutional:      General: She is not in acute distress.    Appearance: She is well-developed and normal weight. She is not ill-appearing.     Comments: Frail appearing   HENT:     Head: Normocephalic and atraumatic.     Comments: No tenderness in temples or sinus areas    Right Ear: Tympanic membrane normal.     Left Ear: Tympanic membrane normal.     Ears:     Comments: Hard of hearing  R hearing aide    Mouth/Throat:     Mouth: Mucous membranes are moist.  Eyes:     Conjunctiva/sclera: Conjunctivae normal.     Pupils: Pupils are equal, round, and reactive to light.  Neck:     Thyroid: No thyromegaly.     Vascular: No carotid bruit or JVD.  Cardiovascular:     Rate and Rhythm:  Normal rate and regular rhythm.     Heart sounds: Normal heart sounds. No gallop.   Pulmonary:     Effort: Pulmonary effort is normal. No respiratory distress.     Breath sounds: Normal breath sounds. No wheezing or rales.  Abdominal:     General: Bowel sounds are normal. There is no distension or abdominal bruit.     Palpations: Abdomen is soft. There is no mass.     Tenderness: There is no abdominal tenderness.  Musculoskeletal:     Cervical back: Normal range of motion and neck supple.  Lymphadenopathy:     Cervical: No cervical adenopathy.  Skin:    General: Skin is warm and dry.     Findings: No rash.  Neurological:     Mental Status: She is alert.     Cranial Nerves: Cranial nerves are intact. No cranial nerve deficit or dysarthria.     Sensory: Sensation is intact.     Motor: No weakness, abnormal muscle tone or pronator drift.     Coordination: Finger-Nose-Finger Test normal.     Deep Tendon Reflexes: Reflexes are normal and symmetric. Reflexes normal.     Comments: No nystagmus No focal weakness  Difficult to assess romberg as she needs to hold on to walker     Psychiatric:        Mood and Affect: Mood normal.        Cognition and Memory: Memory is not impaired.           Assessment & Plan:   Problem List Items Addressed This Visit      Cardiovascular and Mediastinum   Essential hypertension    bp is high today-forgot her medication  inst to take it on return home        Genitourinary  Chronic kidney disease    Labs today  Recent bout of dizziness and headache when tx for uti by urology with bactrim (better now)  Enc better fluid intake      Relevant Orders   Renal function panel   CBC with Differential/Platelet   Recurrent UTI    Last tx with bactrim by urology (Dr Sheppard Penton) and symptoms are better/urine clear  She had dizziness/ha on med-now resolved Lab for renal panel and cbc today  Enc better fluid intake Reassuring exam        Other    Headache   Relevant Orders   CBC with Differential/Platelet   Dizziness - Primary    Pt had positional dizziness with recent bactrim tx (also HA) Better now  Disc poss side eff of above or dehydration with poor fluid intake in setting of CKD Enc fluids Lab today  inst to call if symptoms return

## 2019-09-10 ENCOUNTER — Encounter: Payer: Self-pay | Admitting: *Deleted

## 2019-09-14 ENCOUNTER — Telehealth: Payer: Self-pay

## 2019-09-14 DIAGNOSIS — R339 Retention of urine, unspecified: Secondary | ICD-10-CM | POA: Diagnosis not present

## 2019-09-14 DIAGNOSIS — R3914 Feeling of incomplete bladder emptying: Secondary | ICD-10-CM | POA: Diagnosis not present

## 2019-09-14 DIAGNOSIS — R32 Unspecified urinary incontinence: Secondary | ICD-10-CM | POA: Diagnosis not present

## 2019-09-14 DIAGNOSIS — N3941 Urge incontinence: Secondary | ICD-10-CM | POA: Diagnosis not present

## 2019-09-14 NOTE — Telephone Encounter (Signed)
V/M left requesting cb about lab results.

## 2019-09-15 NOTE — Telephone Encounter (Signed)
Daughter advised letter was mailed regarding labs but I did advise her of Dr. Royden Purl comments regarding labs

## 2019-10-03 DIAGNOSIS — Z7982 Long term (current) use of aspirin: Secondary | ICD-10-CM | POA: Diagnosis not present

## 2019-10-03 DIAGNOSIS — I10 Essential (primary) hypertension: Secondary | ICD-10-CM | POA: Diagnosis not present

## 2019-10-03 DIAGNOSIS — M2550 Pain in unspecified joint: Secondary | ICD-10-CM | POA: Diagnosis not present

## 2019-10-03 DIAGNOSIS — D649 Anemia, unspecified: Secondary | ICD-10-CM | POA: Diagnosis not present

## 2019-10-03 DIAGNOSIS — E785 Hyperlipidemia, unspecified: Secondary | ICD-10-CM | POA: Diagnosis not present

## 2019-10-03 DIAGNOSIS — Z604 Social exclusion and rejection: Secondary | ICD-10-CM | POA: Diagnosis not present

## 2019-10-03 DIAGNOSIS — K5900 Constipation, unspecified: Secondary | ICD-10-CM | POA: Diagnosis not present

## 2019-10-03 DIAGNOSIS — Z8249 Family history of ischemic heart disease and other diseases of the circulatory system: Secondary | ICD-10-CM | POA: Diagnosis not present

## 2019-10-03 DIAGNOSIS — G8929 Other chronic pain: Secondary | ICD-10-CM | POA: Diagnosis not present

## 2019-10-09 ENCOUNTER — Ambulatory Visit: Payer: Medicare PPO | Admitting: Dermatology

## 2019-10-14 DIAGNOSIS — R339 Retention of urine, unspecified: Secondary | ICD-10-CM | POA: Diagnosis not present

## 2019-10-14 DIAGNOSIS — R32 Unspecified urinary incontinence: Secondary | ICD-10-CM | POA: Diagnosis not present

## 2019-10-14 DIAGNOSIS — N3941 Urge incontinence: Secondary | ICD-10-CM | POA: Diagnosis not present

## 2019-10-14 DIAGNOSIS — R3914 Feeling of incomplete bladder emptying: Secondary | ICD-10-CM | POA: Diagnosis not present

## 2019-10-16 ENCOUNTER — Encounter: Payer: Self-pay | Admitting: Family Medicine

## 2019-10-16 DIAGNOSIS — R3914 Feeling of incomplete bladder emptying: Secondary | ICD-10-CM | POA: Diagnosis not present

## 2019-10-16 DIAGNOSIS — N390 Urinary tract infection, site not specified: Secondary | ICD-10-CM | POA: Diagnosis not present

## 2019-10-16 DIAGNOSIS — N39498 Other specified urinary incontinence: Secondary | ICD-10-CM | POA: Diagnosis not present

## 2019-10-16 DIAGNOSIS — N399 Disorder of urinary system, unspecified: Secondary | ICD-10-CM | POA: Diagnosis not present

## 2019-11-05 ENCOUNTER — Ambulatory Visit: Payer: Medicare PPO | Admitting: Podiatry

## 2019-11-09 ENCOUNTER — Encounter: Payer: Self-pay | Admitting: Podiatry

## 2019-11-09 ENCOUNTER — Ambulatory Visit: Payer: Medicare PPO | Admitting: Podiatry

## 2019-11-09 ENCOUNTER — Other Ambulatory Visit: Payer: Self-pay

## 2019-11-09 VITALS — Temp 98.4°F

## 2019-11-09 DIAGNOSIS — E1149 Type 2 diabetes mellitus with other diabetic neurological complication: Secondary | ICD-10-CM

## 2019-11-09 DIAGNOSIS — M79674 Pain in right toe(s): Secondary | ICD-10-CM

## 2019-11-09 DIAGNOSIS — M79675 Pain in left toe(s): Secondary | ICD-10-CM

## 2019-11-09 DIAGNOSIS — N189 Chronic kidney disease, unspecified: Secondary | ICD-10-CM

## 2019-11-09 DIAGNOSIS — Q828 Other specified congenital malformations of skin: Secondary | ICD-10-CM

## 2019-11-09 DIAGNOSIS — B351 Tinea unguium: Secondary | ICD-10-CM

## 2019-11-09 NOTE — Progress Notes (Signed)
This patient returns to my office for at risk foot care.  This patient requires this care by a professional since this patient will be at risk due to having chronic kidney disease and neuropathy.  Patient presents to the office with her daughter. This patient is unable to cut nails herself since the patient cannot reach her nails.These nails are painful walking and wearing shoes.  This patient presents for at risk foot care today.  General Appearance  Alert, conversant and in no acute stress.  Vascular  Dorsalis pedis and posterior tibial  pulses are weakly  palpable  bilaterally.  Capillary return is within normal limits  bilaterally. Temperature is within normal limits  bilaterally.  Neurologic  Senn-Weinstein monofilament wire test within normal limits  bilaterally. Muscle power within normal limits bilaterally.  Nails Thick disfigured discolored nails with subungual debris  from hallux to fifth toes bilaterally. No evidence of bacterial infection or drainage bilaterally.  Orthopedic  No limitations of motion  feet .  No crepitus or effusions noted.  No bony pathology or digital deformities noted.  Skin  normotropic skin with no porokeratosis noted bilaterally.  No signs of infections or ulcers noted.     Onychomycosis  Pain in right toes  Pain in left toes  Consent was obtained for treatment procedures.   Mechanical debridement of nails 1-5  bilaterally performed with a nail nipper.  Filed with dremel without incident. No infection or ulcer.    Return office visit    10 weeks                  Told patient to return for periodic foot care and evaluation due to potential at risk complications.   Helane Gunther DPM

## 2019-11-17 DIAGNOSIS — R32 Unspecified urinary incontinence: Secondary | ICD-10-CM | POA: Diagnosis not present

## 2019-11-17 DIAGNOSIS — N3941 Urge incontinence: Secondary | ICD-10-CM | POA: Diagnosis not present

## 2019-11-17 DIAGNOSIS — R3914 Feeling of incomplete bladder emptying: Secondary | ICD-10-CM | POA: Diagnosis not present

## 2019-11-17 DIAGNOSIS — R339 Retention of urine, unspecified: Secondary | ICD-10-CM | POA: Diagnosis not present

## 2019-11-23 ENCOUNTER — Ambulatory Visit: Payer: Medicare PPO | Admitting: Dermatology

## 2019-12-17 DIAGNOSIS — R3914 Feeling of incomplete bladder emptying: Secondary | ICD-10-CM | POA: Diagnosis not present

## 2019-12-17 DIAGNOSIS — R32 Unspecified urinary incontinence: Secondary | ICD-10-CM | POA: Diagnosis not present

## 2019-12-17 DIAGNOSIS — N3941 Urge incontinence: Secondary | ICD-10-CM | POA: Diagnosis not present

## 2019-12-17 DIAGNOSIS — R339 Retention of urine, unspecified: Secondary | ICD-10-CM | POA: Diagnosis not present

## 2020-01-14 DIAGNOSIS — R339 Retention of urine, unspecified: Secondary | ICD-10-CM | POA: Diagnosis not present

## 2020-01-14 DIAGNOSIS — N3941 Urge incontinence: Secondary | ICD-10-CM | POA: Diagnosis not present

## 2020-01-14 DIAGNOSIS — R32 Unspecified urinary incontinence: Secondary | ICD-10-CM | POA: Diagnosis not present

## 2020-01-14 DIAGNOSIS — R3914 Feeling of incomplete bladder emptying: Secondary | ICD-10-CM | POA: Diagnosis not present

## 2020-01-15 ENCOUNTER — Ambulatory Visit: Payer: Medicare PPO | Admitting: Internal Medicine

## 2020-01-15 ENCOUNTER — Encounter: Payer: Self-pay | Admitting: Internal Medicine

## 2020-01-15 ENCOUNTER — Other Ambulatory Visit: Payer: Self-pay

## 2020-01-15 ENCOUNTER — Telehealth: Payer: Self-pay | Admitting: Family Medicine

## 2020-01-15 DIAGNOSIS — L03116 Cellulitis of left lower limb: Secondary | ICD-10-CM | POA: Diagnosis not present

## 2020-01-15 DIAGNOSIS — S93409A Sprain of unspecified ligament of unspecified ankle, initial encounter: Secondary | ICD-10-CM | POA: Insufficient documentation

## 2020-01-15 DIAGNOSIS — S93402A Sprain of unspecified ligament of left ankle, initial encounter: Secondary | ICD-10-CM | POA: Diagnosis not present

## 2020-01-15 MED ORDER — CEPHALEXIN 500 MG PO CAPS: 500.0000 mg | ORAL_CAPSULE | Freq: Three times a day (TID) | ORAL | 0 refills | Status: DC

## 2020-01-15 NOTE — Progress Notes (Signed)
Subjective:    Patient ID: Tracy Morales, female    DOB: 1920-02-13, 86 y.o.   MRN: [id number]  HPI Here with daughter due to left foot swelling This visit occurred during the SARS-CoV-2 public health emergency.  Safety protocols were in place, including screening questions prior to the visit, additional usage of staff PPE, and extensive cleaning of exam room while observing appropriate contact time as indicated for disinfecting solutions.   Fell 1 week ago No syncope--just fell when getting OOB Fell on bottom--scraped left foot and it was under the bed Needed help--grandson (he lives with her)  Some pain Came in now due to increased swelling Is able to walk on it Pain is not worse  Current Outpatient Medications on File Prior to Visit  Medication Sig Dispense Refill  . acetaminophen (TYLENOL) 500 MG tablet Take 500 mg by mouth every 6 (six) hours as needed. OTC-UAD     . amLODipine (NORVASC) 5 MG tablet Take 1 tablet (5 mg total) by mouth daily. 30 tablet 11  . aspirin 325 MG EC tablet Take 325 mg by mouth daily.    . Cholecalciferol (VITAMIN D3) 2000 units capsule Take 2,000 Units by mouth daily.    Marland Kitchen CRANBERRY PO Take 1 tablet by mouth daily.    . ferrous sulfate 325 (65 FE) MG tablet Take 1 tablet (325 mg total) by mouth 2 (two) times daily. 60 tablet 11  . Meclizine HCl (BONINE PO) Take by mouth as needed.    . mirtazapine (REMERON) 30 MG tablet TAKE 1 TABLET BY MOUTH AT BEDTIME. 90 tablet 2  . Multiple Vitamin (MULTIVITAMIN) tablet Take 1 tablet by mouth daily.    . NON FORMULARY Walker with wheels - for use with ambulation once daily. 429.9, 715.90, 355.9     . psyllium (METAMUCIL) 58.6 % packet Take 1 packet by mouth daily.    . simvastatin (ZOCOR) 20 MG tablet Take one pill by mouth once daily 30 tablet 11   No current facility-administered medications on file prior to visit.    Allergies  Allergen Reactions  . Ace Inhibitors     REACTION: cough    Past Medical  History:  Diagnosis Date  . Allergic rhinitis, cause unspecified   . Bradycardia   . Cerebrovascular disease, unspecified   . Chronic kidney disease, unspecified    chronic renal insufficiency  . Dizziness and giddiness   . DVT (deep venous thrombosis) (HCC)    R  . Dyspnea    echo with normal EF. +diastolic dysfunction  . 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 HTN (hypertension)   . Hyperlipidemia   . Hypertonicity of bladder    hx  . Mild depression (HCC)   . Mononeuritis of lower limb, unspecified    peripheral neuropathy, bilateral  . Occlusion and stenosis of carotid artery without mention of cerebral infarction 4/09   u/s L 40-59%, R 0-39%; stable for many years  . Osteoarthrosis, unspecified whether generalized or localized, unspecified site   . Peripheral vascular disease, unspecified (HCC)   . Polymyalgia rheumatica (HCC)    hx  . RUQ pain   . Urge incontinence     Past Surgical History:  Procedure Laterality Date  . 2D echo  8/06   mild aortic stenosis.   Marland Kitchen arterial doppler    . carotid doppler  4/02   bilateral plaque   . carotid doppler  8/06   non significant stenosis  .  carotid doppler     R 40%; L 40-60%  . CATARACT EXTRACTION    . CHOLECYSTECTOMY  2000  . COLONOSCOPY  1/04   polyps  . CT SCAN  05/25/08   abd and pelvis- inflated urinary bladder and large amt stool throughout w/o blockage   . dexa  2/01   osteopenia  . echo     mild LVH EF 50% As  . triger finger contracture  6/04  . vert. basilar insufficiency  2002  . zoster  5/02    Family History  Problem Relation Age of Onset  . Hypertension Father   . Other Father        Cardiac problems  . Heart disease Father   . Other Brother        blood clot  . Other Sister        benign breast nodule    Social History   Socioeconomic History  . Marital status: Widowed    Spouse name: Not on file  . Number of children: 2  . Years of education: Not on file  . Highest  education level: Not on file  Occupational History  . Occupation: Retired  Tobacco Use  . Smoking status: Never Smoker  . Smokeless tobacco: Never Used  . Tobacco comment: non smoker   Vaping Use  . Vaping Use: Never used  Substance and Sexual Activity  . Alcohol use: No    Alcohol/week: 0.0 standard drinks  . Drug use: No  . Sexual activity: Never  Other Topics Concern  . Not on file  Social History Narrative   Widowed. 2 children. Retired, gets regular exercise.    Social Determinants of Health   Financial Resource Strain:   . Difficulty of Paying Living Expenses:   Food Insecurity:   . Worried About Programme researcher, broadcasting/film/video in the Last Year:   . Barista in the Last Year:   Transportation Needs:   . Freight forwarder (Medical):   Marland Kitchen Lack of Transportation (Non-Medical):   Physical Activity:   . Days of Exercise per Week:   . Minutes of Exercise per Session:   Stress:   . Feeling of Stress :   Social Connections:   . Frequency of Communication with Friends and Family:   . Frequency of Social Gatherings with Friends and Family:   . Attends Religious Services:   . Active Member of Clubs or Organizations:   . Attends Banker Meetings:   Marland Kitchen Marital Status:   Intimate Partner Violence:   . Fear of Current or Ex-Partner:   . Emotionally Abused:   Marland Kitchen Physically Abused:   . Sexually Abused:    Review of Systems  No breathing problems Still sparse appetite--but no change     Objective:   Physical Exam Musculoskeletal:     Comments: Mild swelling around and posterior to left ankle lateral malleolus ROM okay but slight tenderness there  Skin:    Comments: Skin break on lateral left calf----with large oval red, warm and tender area distal to this            Assessment & Plan:

## 2020-01-15 NOTE — Assessment & Plan Note (Signed)
Mild Able to walk on it and a week later Discussed x-rays--but will defer due to above factors (fracture very highly unlikely)

## 2020-01-15 NOTE — Telephone Encounter (Signed)
[facility] Day - Client TELEPHONE ADVICE RECORD [facility] Patient Name: Tracy Morales Gender: Female DOB: Oct 03, 1919 Age: 86 Y 3 M 27 D Return Phone Number: [phone] (Primary), [phone] (Secondary) Address: [place] [address] Client [facility] Day - Client Client Site [facility] [place] - Day Physician Milinda Antis, Idamae Schuller - MD Contact Type Call Who Is Calling Patient / Member / Family / Caregiver Call Type Triage / Clinical Caller Name [name] Relationship To Patient Daughter Return Phone Number [phone] (Primary) Chief Complaint Foot or Ankle Injury Reason for Call Symptomatic / Request for Health Information Initial Comment Caller states patient fell and scraped her leg. Now she has swollen ankle/foot. Translation No Nurse Assessment Nurse: Tresa Endo, RN, Kim Date/Time (Eastern Time): 01/15/2020 9:09:53 AM Confirm and document reason for call. If symptomatic, describe symptoms. ---Caller states her mother fell last Friday and scraped her left leg, now her lt ankle and foot are swollen. States mother is applying ice but the ankle is still uncomfortable. Has the patient had close contact with a person known or suspected to have the novel coronavirus illness OR traveled / lives in area with major community spread (including international travel) in the last 14 days from the onset of symptoms? * If Asymptomatic, screen for exposure and travel within the last 14 days. ---No Does the patient have any new or worsening symptoms? ---Yes Will a triage be completed? ---Yes Related visit to physician within the last 2 weeks? ---No Does the PT have any chronic conditions? (i.e. diabetes, asthma, this includes High risk factors for pregnancy, etc.) ---Yes List chronic conditions. ---HTN Is this a behavioral health or substance abuse call? ---No Guidelines Guideline Title Affirmed Question Affirmed Notes  Nurse Date/Time Lamount Cohen Time) Ankle and Foot Injury Large swelling or bruise (> 2 inches or 5 cm) Tresa Endo, RN, Kim 01/15/2020 9:11:45 AM Disp. Time Lamount Cohen Time) Disposition Final User 01/15/2020 9:13:36 AM See PCP within 24 Hours Yes Tresa Endo, RN, Selena Batten PLEASE NOTE: All timestamps contained within this report are represented as [place] Standard Time. CONFIDENTIALTY NOTICE: This fax transmission is intended only for the addressee. It contains information that is legally privileged, confidential or otherwise protected from use or disclosure. If you are not the intended recipient, you are strictly prohibited from reviewing, disclosing, copying using or disseminating any of this information or taking any action in reliance on or regarding this information. If you have received this fax in error, please notify us immediately by telephone so that we can arrange for its return to [place]. Phone: [phone], Toll-Free: [phone], Fax: [phone] Page: 2 of 2 Call Id: [id number] Caller Disagree/Comply Comply Caller Understands Yes PreDisposition Did not know what to do Care Advice Given Per Guideline SEE PCP WITHIN 24 HOURS: * IF OFFICE WILL BE OPEN: You need to be examined within the next 24 hours. Call your doctor (or NP/PA) when the office opens and make an appointment. APPLY A COLD PACK: PAIN MEDICINES: * For pain relief, you can take either acetaminophen, ibuprofen, or naproxen. CALL BACK IF: * Severe pain persists longer than 2 hours after pain medicine and ice * You become worse. CARE ADVICE given per Foot and Ankle Injury (Adult) guideline. Referrals REFERRED TO PCP OFFICE

## 2020-01-15 NOTE — Assessment & Plan Note (Signed)
After traumatic skin break Using topical antibiotic Will add cephalexin x 7 days

## 2020-01-15 NOTE — Telephone Encounter (Signed)
Seen  °See OV note °

## 2020-01-15 NOTE — Telephone Encounter (Signed)
Pt's daughter called and said that Access Nurse told her she needed an appointment within 24 hours. Pt's foot is swollen. Pt is scheduled to see Dr. Alphonsus Sias @ 11:15 today.

## 2020-01-18 ENCOUNTER — Ambulatory Visit: Payer: Medicare PPO | Admitting: Family Medicine

## 2020-01-27 ENCOUNTER — Telehealth: Payer: Self-pay | Admitting: Family Medicine

## 2020-01-27 NOTE — Telephone Encounter (Signed)
Patient's family member came into office and dropped off form for handicap placard. Placed in tower.

## 2020-01-28 NOTE — Telephone Encounter (Signed)
Done and in IN box 

## 2020-01-28 NOTE — Telephone Encounter (Signed)
In your inbox.

## 2020-01-29 NOTE — Telephone Encounter (Signed)
Daughter notified ready for pick up

## 2020-02-05 ENCOUNTER — Ambulatory Visit
Admission: EM | Admit: 2020-02-05 | Discharge: 2020-02-05 | Disposition: A | Discharge status: Home / Self Care | Payer: Medicare PPO | Attending: Family Medicine | Admitting: Family Medicine

## 2020-02-05 ENCOUNTER — Telehealth: Payer: Self-pay

## 2020-02-05 DIAGNOSIS — L03116 Cellulitis of left lower limb: Secondary | ICD-10-CM

## 2020-02-05 DIAGNOSIS — M79605 Pain in left leg: Secondary | ICD-10-CM

## 2020-02-05 MED ORDER — DOXYCYCLINE HYCLATE 100 MG PO CAPS: 100.0000 mg | ORAL_CAPSULE | Freq: Two times a day (BID) | ORAL | 0 refills | Status: DC

## 2020-02-05 NOTE — Telephone Encounter (Signed)
Aware, I will watch for correspondence 

## 2020-02-05 NOTE — ED Provider Notes (Signed)
[facility]   [id number] 02/05/20 Arrival Time: 1402  CC: RASH  SUBJECTIVE:  Tracy Morales is a 86 y.o. female who presents with a skin complaint that began last month after a fall. Saw primary care and has completed a course of Keflex. Reports that the area did improve but is becoming more sore again. Reports that she fell and scratched her leg when she was being helped to stand. There are no aggravating or alleviating factors. Reports wound and pain to L outer lower leg. Reports the area as red, warm and tender to touch. Denies drainage from the area. Denies fever, chills, nausea, vomiting, erythema, swelling, discharge, oral lesions, SOB, chest pain, abdominal pain, changes in bowel or bladder function.    ROS: As per HPI.  All other pertinent ROS negative.     Past Medical History:  Diagnosis Date  . Allergic rhinitis, cause unspecified   . Bradycardia   . Cerebrovascular disease, unspecified   . Chronic kidney disease, unspecified    chronic renal insufficiency  . Dizziness and giddiness   . DVT (deep venous thrombosis) (HCC)    R  . Dyspnea    echo with normal EF. +diastolic dysfunction  . 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 HTN (hypertension)   . Hyperlipidemia   . Hypertonicity of bladder    hx  . Mild depression (HCC)   . Mononeuritis of lower limb, unspecified    peripheral neuropathy, bilateral  . Occlusion and stenosis of carotid artery without mention of cerebral infarction 4/09   u/s L 40-59%, R 0-39%; stable for many years  . Osteoarthrosis, unspecified whether generalized or localized, unspecified site   . Peripheral vascular disease, unspecified (HCC)   . Polymyalgia rheumatica (HCC)    hx  . RUQ pain   . Urge incontinence    Past Surgical History:  Procedure Laterality Date  . 2D echo  8/06   mild aortic stenosis.   Marland Kitchen arterial doppler    . carotid doppler  4/02   bilateral plaque   . carotid doppler  8/06   non significant  stenosis  . carotid doppler     R 40%; L 40-60%  . CATARACT EXTRACTION    . CHOLECYSTECTOMY  2000  . COLONOSCOPY  1/04   polyps  . CT SCAN  05/25/08   abd and pelvis- inflated urinary bladder and large amt stool throughout w/o blockage   . dexa  2/01   osteopenia  . echo     mild LVH EF 50% As  . triger finger contracture  6/04  . vert. basilar insufficiency  2002  . zoster  5/02   Allergies  Allergen Reactions  . Ace Inhibitors     REACTION: cough  . Sulfa Antibiotics Nausea Only    headache   No current facility-administered medications on file prior to encounter.   Current Outpatient Medications on File Prior to Encounter  Medication Sig Dispense Refill  . acetaminophen (TYLENOL) 500 MG tablet Take 500 mg by mouth every 6 (six) hours as needed. OTC-UAD     . amLODipine (NORVASC) 5 MG tablet Take 1 tablet (5 mg total) by mouth daily. 30 tablet 11  . aspirin 325 MG EC tablet Take 325 mg by mouth daily.    . cephALEXin (KEFLEX) 500 MG capsule Take 1 capsule (500 mg total) by mouth 3 (three) times daily. 21 capsule 0  . Cholecalciferol (VITAMIN D3) 2000 units capsule Take 2,000  Units by mouth daily.    Marland Kitchen CRANBERRY PO Take 1 tablet by mouth daily.    . ferrous sulfate 325 (65 FE) MG tablet Take 1 tablet (325 mg total) by mouth 2 (two) times daily. 60 tablet 11  . Meclizine HCl (BONINE PO) Take by mouth as needed.    . mirtazapine (REMERON) 30 MG tablet TAKE 1 TABLET BY MOUTH AT BEDTIME. 90 tablet 2  . Multiple Vitamin (MULTIVITAMIN) tablet Take 1 tablet by mouth daily.    . NON FORMULARY Walker with wheels - for use with ambulation once daily. 429.9, 715.90, 355.9     . psyllium (METAMUCIL) 58.6 % packet Take 1 packet by mouth daily.    . simvastatin (ZOCOR) 20 MG tablet Take one pill by mouth once daily 30 tablet 11   Social History   Socioeconomic History  . Marital status: Widowed    Spouse name: Not on file  . Number of children: 2  . Years of education: Not on file    . Highest education level: Not on file  Occupational History  . Occupation: Retired  Tobacco Use  . Smoking status: Never Smoker  . Smokeless tobacco: Never Used  . Tobacco comment: non smoker   Vaping Use  . Vaping Use: Never used  Substance and Sexual Activity  . Alcohol use: No    Alcohol/week: 0.0 standard drinks  . Drug use: No  . Sexual activity: Never  Other Topics Concern  . Not on file  Social History Narrative   Widowed. 2 children. Retired, gets regular exercise.    Social Determinants of Health   Financial Resource Strain:   . Difficulty of Paying Living Expenses: Not on file  Food Insecurity:   . Worried About Programme researcher, broadcasting/film/video in the Last Year: Not on file  . Ran Out of Food in the Last Year: Not on file  Transportation Needs:   . Lack of Transportation (Medical): Not on file  . Lack of Transportation (Non-Medical): Not on file  Physical Activity:   . Days of Exercise per Week: Not on file  . Minutes of Exercise per Session: Not on file  Stress:   . Feeling of Stress : Not on file  Social Connections:   . Frequency of Communication with Friends and Family: Not on file  . Frequency of Social Gatherings with Friends and Family: Not on file  . Attends Religious Services: Not on file  . Active Member of Clubs or Organizations: Not on file  . Attends Banker Meetings: Not on file  . Marital Status: Not on file  Intimate Partner Violence:   . Fear of Current or Ex-Partner: Not on file  . Emotionally Abused: Not on file  . Physically Abused: Not on file  . Sexually Abused: Not on file   Family History  Problem Relation Age of Onset  . Hypertension Father   . Other Father        Cardiac problems  . Heart disease Father   . Other Brother        blood clot  . Other Sister        benign breast nodule    OBJECTIVE: There were no vitals filed for this visit.  General appearance: alert; no distress Head: NCAT Lungs: clear to auscultation  bilaterally Heart: regular rate and rhythm.  Radial pulse 2+ bilaterally Extremities: no edema Skin: warm and dry; about 0.5cm diameter of scab to wound, erythema, heat and tenderness to  about 2x2 cm area surrounding the wound Psychological: alert and cooperative; normal mood and affect  ASSESSMENT & PLAN:  1. Cellulitis of left lower extremity   2. Left leg pain     Meds ordered this encounter  Medications  . doxycycline (VIBRAMYCIN) 100 MG capsule    Sig: Take 1 capsule (100 mg total) by mouth 2 (two) times daily.    Dispense:  14 capsule    Refill:  0    Order Specific Question:   Supervising Provider    Answer:   Merrilee Jansky [id number]    Prescribed doxycycline 100mg  BID x 7 days Take as prescribed and to completion Avoid hot showers/ baths Moisturize skin daily  Follow up with PCP or this office if symptoms persists Return or go to the ER if you have any new or worsening symptoms such as fever, chills, nausea, vomiting, redness, swelling, discharge, if symptoms do not improve with medications  Reviewed expectations re: course of current medical issues. Questions answered. Outlined signs and symptoms indicating need for more acute intervention. Patient verbalized understanding. After Visit Summary given.   , NP 02/05/20 1609

## 2020-02-05 NOTE — Discharge Instructions (Addendum)
I have sent in doxycycline for you to take twice a day for 7 days  If your leg is still not improving with this medication ,follow up with primary care or with this office as needed  Follow up in the ER for red streaking up your leg, increased pain, swelling, other concerning symptoms

## 2020-02-05 NOTE — Telephone Encounter (Signed)
[facility] - Client TELEPHONE ADVICE RECORD [facility] Patient Name: Tracy Morales Gender: Female DOB: 08-Jun-1919 Age: 86 Y 4 M 17 D Return Phone Number: [phone] (Primary), [phone] (Secondary) Address: [place] [address] Client [facility] Day - Client Client Site [facility] [place] - Day Physician Milinda Antis, Idamae Schuller - MD Contact Type Call Who Is Calling Patient / Member / Family / Caregiver Call Type Triage / Clinical Caller Name Minera Relationship To Patient Daughter Return Phone Number [phone] (Primary) Chief Complaint Leg Pain Reason for Call Symptomatic / Request for Health Information Initial Comment Caller states, pt leg is swollen, red and tender. Pt has an appt Tuesday. Additional Comment Caller is not with be pt. GOTO Facility Not Listed [facility] urgent care Translation No Nurse Assessment Nurse: Stefano Gaul, RN, Dwana Curd Date/Time (Eastern Time): 02/05/2020 10:11:00 AM Confirm and document reason for call. If symptomatic, describe symptoms. ---Caller states she is not with her mother. She is by herself and will not answer the phone. Her left leg is red, swollen and tender. finished her antibiotics that was prescribed a month ago. no fever. Has the patient had close contact with a person known or suspected to have the novel coronavirus illness OR traveled / lives in area with major community spread (including international travel) in the last 14 days from the onset of symptoms? * If Asymptomatic, screen for exposure and travel within the last 14 days. ---No Does the patient have any new or worsening symptoms? ---Yes Will a triage be completed? ---Yes Related visit to physician within the last 2 weeks? ---No Does the PT have any chronic conditions? (i.e. diabetes, asthma, this includes High risk factors for pregnancy, etc.) ---Yes List chronic conditions. ---arthritis; HTN Is  this a behavioral health or substance abuse call? ---No Guidelines Guideline Title Affirmed Question Affirmed Notes Nurse Date/Time Lamount Cohen Time) Leg Swelling and Edema [1] Thigh, calf, or ankle swelling AND [2] only 1 side Stefano Gaul, RN, Dwana Curd 02/05/2020 10:15:01 AM PLEASE NOTE: All timestamps contained within this report are represented as [place] Standard Time. CONFIDENTIALTY NOTICE: This fax transmission is intended only for the addressee. It contains information that is legally privileged, confidential or otherwise protected from use or disclosure. If you are not the intended recipient, you are strictly prohibited from reviewing, disclosing, copying using or disseminating any of this information or taking any action in reliance on or regarding this information. If you have received this fax in error, please notify us immediately by telephone so that we can arrange for its return to [place]. Phone: [phone], Toll-Free: [phone], Fax: [phone] Page: 2 of 2 Call Id: [id number] Disp. Time Lamount Cohen Time) Disposition Final User 02/05/2020 10:19:27 AM See HCP within 4 Hours (or PCP triage) Yes Stefano Gaul, RN, Clerance Lav Disagree/Comply Comply Caller Understands Yes PreDisposition Call Doctor Care Advice Given Per Guideline * IF OFFICE WILL BE OPEN: You need to be seen within the next 3 or 4 hours. Call your doctor (or NP/PA) now or as soon as the office opens. SEE HCP WITHIN 4 HOURS (OR PCP TRIAGE): CALL EMS IF: * Chest pain or shortness of breath occurs. CARE ADVICE given per Leg Swelling and Edema (Adult) guideline. Comments User: Art Buff, RN Date/Time Lamount Cohen Time): 02/05/2020 10:23:30 AM Called back line and no appts are available for today. Advised daughter to take her to the ER or urgent care. states she will take her to urgent care Referrals GO TO FACILITY OTHER - SPECIFY

## 2020-02-05 NOTE — ED Notes (Signed)
Triaged by provider  

## 2020-02-09 ENCOUNTER — Ambulatory Visit: Payer: Medicare PPO | Admitting: Family Medicine

## 2020-02-11 ENCOUNTER — Other Ambulatory Visit: Payer: Self-pay

## 2020-02-11 ENCOUNTER — Encounter: Payer: Self-pay | Admitting: Podiatry

## 2020-02-11 ENCOUNTER — Ambulatory Visit: Payer: Medicare PPO | Admitting: Podiatry

## 2020-02-11 DIAGNOSIS — B351 Tinea unguium: Secondary | ICD-10-CM | POA: Diagnosis not present

## 2020-02-11 DIAGNOSIS — E1149 Type 2 diabetes mellitus with other diabetic neurological complication: Secondary | ICD-10-CM | POA: Diagnosis not present

## 2020-02-11 DIAGNOSIS — N189 Chronic kidney disease, unspecified: Secondary | ICD-10-CM | POA: Diagnosis not present

## 2020-02-11 DIAGNOSIS — M79674 Pain in right toe(s): Secondary | ICD-10-CM

## 2020-02-11 DIAGNOSIS — M79675 Pain in left toe(s): Secondary | ICD-10-CM

## 2020-02-11 DIAGNOSIS — Q828 Other specified congenital malformations of skin: Secondary | ICD-10-CM

## 2020-02-11 NOTE — Progress Notes (Signed)
This patient returns to my office for at risk foot care.  This patient requires this care by a professional since this patient will be at risk due to having chronic kidney disease and neuropathy.  Patient presents to the office with her daughter.  This patient is unable to cut nails herself since the patient cannot reach her nails.These nails are painful walking and wearing shoes.  This patient presents for at risk foot care today.  General Appearance  Alert, conversant and in no acute stress.  Vascular  Dorsalis pedis and posterior tibial  pulses are weakly  palpable  bilaterally.  Capillary return is within normal limits  bilaterally. Temperature is within normal limits  bilaterally.  Neurologic  Senn-Weinstein monofilament wire test within normal limits  bilaterally. Muscle power within normal limits bilaterally.  Nails Thick disfigured discolored nails with subungual debris  from hallux to fifth toes bilaterally. Pincer nails  B/L.   No evidence of bacterial infection or drainage bilaterally.  Orthopedic  No limitations of motion  feet .  No crepitus or effusions noted.  No bony pathology or digital deformities noted.  Skin  normotropic skin with no porokeratosis noted bilaterally.  No signs of infections or ulcers noted.     Onychomycosis  Pain in right toes  Pain in left toes  Consent was obtained for treatment procedures.   Mechanical debridement of nails 1-5  bilaterally performed with a nail nipper.  Filed with dremel without incident. No infection or ulcer.    Return office visit    10 weeks                  Told patient to return for periodic foot care and evaluation due to potential at risk complications.   Helane Gunther DPM

## 2020-02-13 ENCOUNTER — Ambulatory Visit: Payer: Medicare PPO

## 2020-02-15 DIAGNOSIS — N3941 Urge incontinence: Secondary | ICD-10-CM | POA: Diagnosis not present

## 2020-02-15 DIAGNOSIS — R339 Retention of urine, unspecified: Secondary | ICD-10-CM | POA: Diagnosis not present

## 2020-02-15 DIAGNOSIS — R3914 Feeling of incomplete bladder emptying: Secondary | ICD-10-CM | POA: Diagnosis not present

## 2020-02-15 DIAGNOSIS — R32 Unspecified urinary incontinence: Secondary | ICD-10-CM | POA: Diagnosis not present

## 2020-02-17 ENCOUNTER — Other Ambulatory Visit: Payer: Self-pay

## 2020-02-17 ENCOUNTER — Ambulatory Visit (INDEPENDENT_AMBULATORY_CARE_PROVIDER_SITE_OTHER): Payer: Medicare PPO

## 2020-02-17 DIAGNOSIS — Z23 Encounter for immunization: Secondary | ICD-10-CM | POA: Diagnosis not present

## 2020-02-19 ENCOUNTER — Encounter: Payer: Self-pay | Admitting: Family Medicine

## 2020-02-19 ENCOUNTER — Ambulatory Visit (INDEPENDENT_AMBULATORY_CARE_PROVIDER_SITE_OTHER)
Admission: RE | Admit: 2020-02-19 | Discharge: 2020-02-19 | Disposition: A | Discharge status: Home / Self Care | Payer: Medicare PPO | Source: Ambulatory Visit | Attending: Family Medicine | Admitting: Family Medicine

## 2020-02-19 ENCOUNTER — Ambulatory Visit: Payer: Medicare PPO | Admitting: Family Medicine

## 2020-02-19 ENCOUNTER — Other Ambulatory Visit: Payer: Self-pay

## 2020-02-19 ENCOUNTER — Telehealth: Payer: Self-pay

## 2020-02-19 VITALS — BP 130/82 | HR 67 | Temp 96.3°F | Ht 64.0 in | Wt 123.0 lb

## 2020-02-19 DIAGNOSIS — Z9181 History of falling: Secondary | ICD-10-CM

## 2020-02-19 DIAGNOSIS — L03116 Cellulitis of left lower limb: Secondary | ICD-10-CM

## 2020-02-19 DIAGNOSIS — M79605 Pain in left leg: Secondary | ICD-10-CM | POA: Diagnosis not present

## 2020-02-19 DIAGNOSIS — S8992XA Unspecified injury of left lower leg, initial encounter: Secondary | ICD-10-CM | POA: Diagnosis not present

## 2020-02-19 DIAGNOSIS — M1712 Unilateral primary osteoarthritis, left knee: Secondary | ICD-10-CM | POA: Diagnosis not present

## 2020-02-19 DIAGNOSIS — M7989 Other specified soft tissue disorders: Secondary | ICD-10-CM | POA: Diagnosis not present

## 2020-02-19 NOTE — Telephone Encounter (Signed)
Error

## 2020-02-19 NOTE — Patient Instructions (Signed)
Try to elevate legs when you sit   Watch for increased redness or swelling   Use a light compression sock to the knee or use an ace bandage for light compression (to help veins and discomfort)  Do this during the day (not at night)   Xray today  We will call with results

## 2020-02-19 NOTE — Progress Notes (Signed)
Subjective:    Patient ID: Tracy Morales, female    DOB: 30-Apr-1920, 86 y.o.   MRN: [id number]  This visit occurred during the SARS-CoV-2 public health emergency.  Safety protocols were in place, including screening questions prior to the visit, additional usage of staff PPE, and extensive cleaning of exam room while observing appropriate contact time as indicated for disinfecting solutions.    HPI  Pt presents with L leg pain from a fall    Wt Readings from Last 3 Encounters:  02/19/20 123 lb (55.8 kg)  09/09/19 127 lb 7 oz (57.8 kg)  05/15/19 127 lb 5 oz (57.7 kg)   21.11 kg/m  Presents for f/u of cellulitis (wound of L leg)  Has been on keflex and then doxycycline Infection is better   Foot is still swollen   Is sore - is walking on it ok  Lower leg/posterior near achilles is the area that hurts Ankle does not really bother her   Has had multiple falls  Has a walker - supposed to use it full time  Looses balance easily  Especially when she tries to do too much/more than one thing   Lab Results  Component Value Date   WBC 3.7 (L) 09/09/2019   HGB 13.4 09/09/2019   HCT 40.2 09/09/2019   MCV 90.3 09/09/2019   PLT 224.0 09/09/2019   Patient Active Problem List   Diagnosis Date Noted  . Left leg pain 02/19/2020  . Ankle sprain 01/15/2020  . Cellulitis of left leg 01/15/2020  . Headache 09/09/2019  . Dizziness 09/09/2019  . Seborrheic dermatitis of scalp 08/27/2019  . Elevated random blood glucose level 05/11/2019  . Pain due to onychomycosis of toenails of both feet 12/04/2018  . Recurrent UTI 09/22/2018  . Low back pain 09/15/2018  . MCI (mild cognitive impairment) 05/11/2018  . Frequent urination 02/28/2018  . Mobility impaired 05/06/2017  . Osteoarthritis of both hips 04/15/2015  . Hip pain 04/14/2015  . Routine general medical examination at a health care facility 04/12/2015  . Hair loss 11/19/2014  . Loss of weight 06/09/2014  . Arthritis of right  shoulder region 06/09/2014  . Nodule of left lung 06/09/2014  . Encounter for Medicare annual wellness exam 10/30/2013  . H/O fall 04/27/2013  . Risk for falls 04/11/2012  . Leg pain, bilateral 01/29/2012  . MICROSCOPIC HEMATURIA 08/29/2009  . CONDUCTIVE HEARING LOSS BILATERAL 08/08/2009  . EDEMA 04/07/2009  . BRADYCARDIA 10/26/2008  . DYSPNEA 10/26/2008  . INCONTINENCE, URGE 02/27/2008  . Hyperlipidemia 10/11/2006  . PERIPHERAL NEUROPATHY, LOWER EXTREMITIES, BILATERAL 10/11/2006  . Essential hypertension 10/11/2006  . DIASTOLIC DYSFUNCTION 10/11/2006  . CAROTID STENOSIS 10/11/2006  . CEREBROVASCULAR DISEASE 10/11/2006  . PERIPHERAL VASCULAR DISEASE 10/11/2006  . ALLERGIC RHINITIS 10/11/2006  . Chronic kidney disease 10/11/2006  . OVERACTIVE BLADDER 10/11/2006  . OSTEOARTHRITIS 10/11/2006  . CARDIAC MURMUR, HX OF 10/11/2006   Past Medical History:  Diagnosis Date  . Allergic rhinitis, cause unspecified   . Bradycardia   . Cerebrovascular disease, unspecified   . Chronic kidney disease, unspecified    chronic renal insufficiency  . Dizziness and giddiness   . DVT (deep venous thrombosis) (HCC)    R  . Dyspnea    echo with normal EF. +diastolic dysfunction  . 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 HTN (hypertension)   . Hyperlipidemia   . Hypertonicity of bladder    hx  . Mild depression (HCC)   .  Mononeuritis of lower limb, unspecified    peripheral neuropathy, bilateral  . Occlusion and stenosis of carotid artery without mention of cerebral infarction 4/09   u/s L 40-59%, R 0-39%; stable for many years  . Osteoarthrosis, unspecified whether generalized or localized, unspecified site   . Peripheral vascular disease, unspecified (HCC)   . Polymyalgia rheumatica (HCC)    hx  . RUQ pain   . Urge incontinence    Past Surgical History:  Procedure Laterality Date  . 2D echo  8/06   mild aortic stenosis.   Marland Kitchen arterial doppler    . carotid doppler  4/02    bilateral plaque   . carotid doppler  8/06   non significant stenosis  . carotid doppler     R 40%; L 40-60%  . CATARACT EXTRACTION    . CHOLECYSTECTOMY  2000  . COLONOSCOPY  1/04   polyps  . CT SCAN  05/25/08   abd and pelvis- inflated urinary bladder and large amt stool throughout w/o blockage   . dexa  2/01   osteopenia  . echo     mild LVH EF 50% As  . triger finger contracture  6/04  . vert. basilar insufficiency  2002  . zoster  5/02   Social History   Tobacco Use  . Smoking status: Never Smoker  . Smokeless tobacco: Never Used  . Tobacco comment: non smoker   Vaping Use  . Vaping Use: Never used  Substance Use Topics  . Alcohol use: No    Alcohol/week: 0.0 standard drinks  . Drug use: No   Family History  Problem Relation Age of Onset  . Hypertension Father   . Other Father        Cardiac problems  . Heart disease Father   . Other Brother        blood clot  . Other Sister        benign breast nodule   Allergies  Allergen Reactions  . Ace Inhibitors     REACTION: cough  . Sulfa Antibiotics Nausea Only    headache   Current Outpatient Medications on File Prior to Visit  Medication Sig Dispense Refill  . acetaminophen (TYLENOL) 500 MG tablet Take 500 mg by mouth every 6 (six) hours as needed. OTC-UAD     . amLODipine (NORVASC) 5 MG tablet Take 1 tablet (5 mg total) by mouth daily. 30 tablet 11  . aspirin 325 MG EC tablet Take 325 mg by mouth daily.    . Cholecalciferol (VITAMIN D3) 2000 units capsule Take 2,000 Units by mouth daily.    Marland Kitchen CRANBERRY PO Take 1 tablet by mouth daily.    . ferrous sulfate 325 (65 FE) MG tablet Take 1 tablet (325 mg total) by mouth 2 (two) times daily. 60 tablet 11  . Meclizine HCl (BONINE PO) Take by mouth as needed.    . mirtazapine (REMERON) 30 MG tablet TAKE 1 TABLET BY MOUTH AT BEDTIME. 90 tablet 2  . Multiple Vitamin (MULTIVITAMIN) tablet Take 1 tablet by mouth daily.    . NON FORMULARY Walker with wheels - for use  with ambulation once daily. 429.9, 715.90, 355.9     . psyllium (METAMUCIL) 58.6 % packet Take 1 packet by mouth daily.    . simvastatin (ZOCOR) 20 MG tablet Take one pill by mouth once daily 30 tablet 11   No current facility-administered medications on file prior to visit.      Review of Systems  Constitutional: Negative for activity change, appetite change, fatigue, fever and unexpected weight change.  HENT: Negative for congestion, ear pain, rhinorrhea, sinus pressure and sore throat.   Eyes: Negative for pain, redness and visual disturbance.  Respiratory: Negative for cough, shortness of breath and wheezing.   Cardiovascular: Negative for chest pain and palpitations.  Gastrointestinal: Negative for abdominal pain, blood in stool, constipation and diarrhea.  Endocrine: Negative for polydipsia and polyuria.  Genitourinary: Negative for dysuria, frequency and urgency.  Musculoskeletal: Positive for arthralgias. Negative for back pain and myalgias.       Leg pain   Skin: Positive for wound. Negative for pallor and rash.  Allergic/Immunologic: Negative for environmental allergies.  Neurological: Negative for dizziness, syncope and headaches.  Hematological: Negative for adenopathy. Does not bruise/bleed easily.  Psychiatric/Behavioral: Negative for decreased concentration and dysphoric mood. The patient is not nervous/anxious.        Objective:   Physical Exam Constitutional:      Appearance: Normal appearance. She is normal weight. She is not ill-appearing.     Comments: Frail appearing elderly female  HENT:     Head: Normocephalic and atraumatic.  Cardiovascular:     Rate and Rhythm: Normal rate and regular rhythm.     Pulses: Normal pulses.     Heart sounds: Normal heart sounds.  Pulmonary:     Effort: Pulmonary effort is normal. No respiratory distress.     Breath sounds: Normal breath sounds. No wheezing or rales.  Musculoskeletal:     Left lower leg: Edema present.      Comments: L foot is swollen dorsally  Some swelling around/post to wound on L shin  Mildly tender  Nl rom of L ankle and foot with no pain or tenderness  Skin:    General: Skin is warm and dry.     Findings: Erythema present.     Comments: Healing wound/1 cm scab on L shin with scant redness  Some swelling and enlarged (compressible) varicosities noted as well  Some tenderness just under area of wound posteriorly No drainage or weeping  R foot is swollen dorsally  Neurological:     Mental Status: She is alert.  Psychiatric:        Mood and Affect: Mood normal.           Assessment & Plan:   Problem List Items Addressed This Visit      Other   H/O fall    Again discussed fall prev with pt who desires independence Enc strongly need for walker use at all times She agrees  Very high fall risk with age and poor balance      Cellulitis of left leg - Primary    Rev last office visit here along with ER note Reviewed hospital records, lab results and studies in detail   Overall reassuring and improved with doxycycline Still some pain/swelling  Will check tib/fib xray to r/o osteomyelitis       Relevant Orders   DG Tibia/Fibula Left (Completed)   Left leg pain    Suspect due to past trauma and cellulitis  Improvement noted on exam  Reviewed hospital records, lab results and studies in detail   Varicosities may cause some discomfort-adv compression with light supp sock or ace with elevation  Xray of tib/fib to r/o osteomyelitis also ordered  inst to call if worse pain or swelling or if no further improvement      Relevant Orders   DG Tibia/Fibula Left (Completed)

## 2020-02-19 NOTE — Assessment & Plan Note (Addendum)
Suspect due to past trauma and cellulitis  Improvement noted on exam  Reviewed hospital records, lab results and studies in detail   Varicosities may cause some discomfort-adv compression with light supp sock or ace with elevation  Xray of tib/fib to r/o osteomyelitis also ordered  inst to call if worse pain or swelling or if no further improvement

## 2020-02-20 NOTE — Assessment & Plan Note (Signed)
Again discussed fall prev with pt who desires independence Enc strongly need for walker use at all times She agrees  Very high fall risk with age and poor balance

## 2020-02-20 NOTE — Assessment & Plan Note (Signed)
Rev last office visit here along with ER note Reviewed hospital records, lab results and studies in detail   Overall reassuring and improved with doxycycline Still some pain/swelling  Will check tib/fib xray to r/o osteomyelitis

## 2020-03-03 ENCOUNTER — Ambulatory Visit (INDEPENDENT_AMBULATORY_CARE_PROVIDER_SITE_OTHER): Payer: Medicare PPO | Admitting: Dermatology

## 2020-03-03 ENCOUNTER — Encounter: Payer: Self-pay | Admitting: Dermatology

## 2020-03-03 ENCOUNTER — Other Ambulatory Visit: Payer: Self-pay

## 2020-03-03 DIAGNOSIS — L219 Seborrheic dermatitis, unspecified: Secondary | ICD-10-CM

## 2020-03-03 DIAGNOSIS — L659 Nonscarring hair loss, unspecified: Secondary | ICD-10-CM

## 2020-03-03 MED ORDER — CLOBETASOL PROPIONATE 0.05 % EX SOLN: CUTANEOUS | 1 refills | Status: DC

## 2020-03-03 MED ORDER — KETOCONAZOLE 2 % EX SHAM: MEDICATED_SHAMPOO | CUTANEOUS | 2 refills | Status: DC

## 2020-03-03 MED ORDER — HYDROCORTISONE 2.5 % EX CREA: TOPICAL_CREAM | CUTANEOUS | 3 refills | Status: DC

## 2020-03-03 NOTE — Progress Notes (Signed)
   New Patient Visit  Subjective  Tracy Morales is a 86 y.o. female who presents for the following: New Patient (Initial Visit) (Scalp scaly, flaky and itchy).  Patient presents as new patient to establish care and for scaly, itchy and flaky scalp, eyebrows and ears for years. She has been using [employer] And T- Gel Shampoo, and Sulphur medicated scalp treatment but does not make go away.  Patient also has hair loss  The following portions of the chart were reviewed this encounter and updated as appropriate:  Tobacco  Allergies  Meds  Problems  Med Hx  Surg Hx  Fam Hx      Review of Systems:  No other skin or systemic complaints except as noted in HPI or Assessment and Plan.  Objective  Well appearing patient in no apparent distress; mood and affect are within normal limits.  A focused examination was performed including head, including the scalp, face, neck, nose, ears, eyelids, and lips. Relevant physical exam findings are noted in the Assessment and Plan.  Objective  Scalp: Diffuse erythema and mild scale on scale  Objective  Scalp: Diffuse hair thinning   Assessment & Plan  Seborrheic dermatitis Scalp  Chronic, flared  Start Hydrocortisone cream 2.5% apply to affected area on Eyebrows twice daily for up to 2 week  Start Ketoconazole shampoo message into scalp 3 times a week, leave on for 10 minutes  Start Clobetasol sol apply to scalp once daily as needed for itch. Avoid applying to face, groin, and axilla. Use as directed. Risk of skin atrophy with long-term use reviewed.   Topical steroids (such as triamcinolone, fluocinolone, fluocinonide, mometasone, clobetasol, halobetasol, betamethasone, hydrocortisone) can cause thinning and lightening of the skin if they are used for too long in the same area. Your physician has selected the right strength medicine for your problem and area affected on the body. Please use your medication only as directed by your  physician to prevent side effects.    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 will order ferritin and vit d today  Ferritin - Scalp  VITAMIN D 25 Hydroxy (Vit-D Deficiency, Fractures) - Scalp  Return in about 6 weeks (around 04/14/2020) for SEB DERM.  ILeward Quan, CMA, am acting as scribe for Darden Dates, MD .  Documentation: I have reviewed the above documentation for accuracy and completeness, and I agree with the above.  Darden Dates, MD

## 2020-03-03 NOTE — Patient Instructions (Addendum)
Recommend daily broad spectrum sunscreen SPF 30+ to sun-exposed areas, reapply every 2 hours as needed. Call for new or changing lesions.  Dandruff:  Start Hydrocortisone cream 2.5% apply to affected area on Eyebrows twice daily for up to 2 week FACE ONLY  Start Ketoconazole shampoo message into scalp 3 times a week, leave on for 10 minutes  Start Clobetasol sol apply to scalp once daily as needed for itch. SCALP ONLY  Hair Loss:  Recommend using Men's version Rogaine

## 2020-03-04 LAB — FERRITIN: Ferritin: 856 ng/mL — ABNORMAL HIGH (ref 15–150)

## 2020-03-04 LAB — VITAMIN D 25 HYDROXY (VIT D DEFICIENCY, FRACTURES): Vit D, 25-Hydroxy: 72.9 ng/mL (ref 30.0–100.0)

## 2020-03-09 ENCOUNTER — Telehealth: Payer: Self-pay

## 2020-03-09 NOTE — Telephone Encounter (Signed)
-----   Message from [place], MD sent at 03/09/2020  9:22 AM EDT ----- Vitamin D is within normal limits and high enough for good hair growth Ferritin (marker of iron stores in the body) is elevated, likely due to chronic disease or inflammation in the body. No iron supplementation is needed.   Hair loss likely due to genetics and age, with possible worsening due to chronic disease. Would recommend Rogaine daily since this is bothersome for her, but all of her labs look ok from a hair loss perspective.  MAs please call

## 2020-03-09 NOTE — Telephone Encounter (Signed)
Patient's daughter called and informed of biopsy results, patient's daughter verbalized understanding.

## 2020-03-09 NOTE — Progress Notes (Signed)
Vitamin D is within normal limits and high enough for good hair growth Ferritin (marker of iron stores in the body) is elevated, likely due to chronic disease or inflammation in the body. No iron supplementation is needed.   Hair loss likely due to genetics and age, with possible worsening due to chronic disease. Would recommend Rogaine daily since this is bothersome for her, but all of her labs look ok from a hair loss perspective.  MAs please call

## 2020-03-15 DIAGNOSIS — N3941 Urge incontinence: Secondary | ICD-10-CM | POA: Diagnosis not present

## 2020-03-15 DIAGNOSIS — R32 Unspecified urinary incontinence: Secondary | ICD-10-CM | POA: Diagnosis not present

## 2020-03-15 DIAGNOSIS — R339 Retention of urine, unspecified: Secondary | ICD-10-CM | POA: Diagnosis not present

## 2020-03-15 DIAGNOSIS — R3914 Feeling of incomplete bladder emptying: Secondary | ICD-10-CM | POA: Diagnosis not present

## 2020-03-26 ENCOUNTER — Encounter: Payer: Self-pay | Admitting: Dermatology

## 2020-03-28 ENCOUNTER — Other Ambulatory Visit: Payer: Self-pay | Admitting: Family Medicine

## 2020-03-29 NOTE — Telephone Encounter (Signed)
Last filled on 06/24/19 #90 tabs with 2 refills, AWV scheduled with PCP on 05/20/20

## 2020-04-14 ENCOUNTER — Other Ambulatory Visit: Payer: Self-pay

## 2020-04-14 ENCOUNTER — Ambulatory Visit: Payer: Medicare PPO | Admitting: Dermatology

## 2020-04-14 DIAGNOSIS — L219 Seborrheic dermatitis, unspecified: Secondary | ICD-10-CM | POA: Diagnosis not present

## 2020-04-14 NOTE — Patient Instructions (Signed)
Continue clobetasol solution increasing to daily. Avoid applying to face, groin, and axilla. Use as directed. Risk of skin atrophy with long-term use reviewed.  Continue hydrocortisone 2.5% cream to eyebrows as needed for itch.  Gentle Skin Care Guide  1. Bathe no more than once a day.  2. Avoid bathing in hot water  3. Use a mild soap like Dove, Vanicream, Cetaphil, CeraVe. Can use Lever 2000 or Cetaphil antibacterial soap  4. Use soap only where you need it. On most days, use it under your arms, between your legs, and on your feet. Let the water rinse other areas unless visibly dirty.  5. When you get out of the bath/shower, use a towel to gently blot your skin dry, don't rub it.  6. While your skin is still a little damp, apply a moisturizing cream such as Vanicream, CeraVe, Cetaphil, Eucerin, Sarna lotion or plain Vaseline Jelly. For hands apply [employer] [place] Hand Cream or Excipial Hand Cream.  7. Reapply moisturizer any time you start to itch or feel dry.  8. Sometimes using free and clear laundry detergents can be helpful. Fabric softener sheets should be avoided. Downy Free & Gentle liquid, or any liquid fabric softener that is free of dyes and perfumes, it acceptable to use  9. If your doctor has given you prescription creams you may apply moisturizers over them

## 2020-04-14 NOTE — Progress Notes (Signed)
   Follow-Up Visit   Subjective  Tracy Morales is a 86 y.o. female who presents for the following: Follow-up (Patient here for 2 week seb derm follow up. She is using ketoconazole 2% shampoo three times weekly and using clobetasol solution but not daily. She is also using HC 2.5% cream at eyebrows. ).  Patient advises scalp was better but is starting to itch again.   The following portions of the chart were reviewed this encounter and updated as appropriate:  Tobacco  Allergies  Meds  Problems  Med Hx  Surg Hx  Fam Hx      Review of Systems:  No other skin or systemic complaints except as noted in HPI or Assessment and Plan.  Objective  Well appearing patient in no apparent distress; mood and affect are within normal limits.  A focused examination was performed including scalp, face. Relevant physical exam findings are noted in the Assessment and Plan.  Objective  Scalp: Erythema of scalp    Assessment & Plan  Seborrheic dermatitis Scalp  Chronic condition, no cure, only control. Currently flared.  Continue clobetasol solution increasing to daily. Avoid applying to face, groin, and axilla. Use as directed. Risk of skin atrophy with long-term use reviewed.   Continue hydrocortisone 2.5% cream to eyebrows as needed for itch.   Face is well controlled, scalp Chronic, not currently at goal  Recommend Gentle Skin Care  Continue ketoconazole shampoo 3 times per week  ketoconazole (NIZORAL) 2 % shampoo - Scalp  clobetasol (TEMOVATE) 0.05 % external solution - Scalp  hydrocortisone 2.5 % cream - Scalp  Return in about 3 months (around 07/15/2020).  Anise Salvo, RMA, am acting as scribe for Darden Dates, MD .  Documentation: I have reviewed the above documentation for accuracy and completeness, and I agree with the above.  Darden Dates, MD

## 2020-04-15 DIAGNOSIS — N3941 Urge incontinence: Secondary | ICD-10-CM | POA: Diagnosis not present

## 2020-04-15 DIAGNOSIS — R339 Retention of urine, unspecified: Secondary | ICD-10-CM | POA: Diagnosis not present

## 2020-04-15 DIAGNOSIS — R3914 Feeling of incomplete bladder emptying: Secondary | ICD-10-CM | POA: Diagnosis not present

## 2020-04-15 DIAGNOSIS — R32 Unspecified urinary incontinence: Secondary | ICD-10-CM | POA: Diagnosis not present

## 2020-04-21 ENCOUNTER — Ambulatory Visit: Payer: Medicare PPO | Admitting: Podiatry

## 2020-04-21 ENCOUNTER — Encounter: Payer: Self-pay | Admitting: Podiatry

## 2020-04-21 ENCOUNTER — Other Ambulatory Visit: Payer: Self-pay

## 2020-04-21 DIAGNOSIS — B351 Tinea unguium: Secondary | ICD-10-CM

## 2020-04-21 DIAGNOSIS — N189 Chronic kidney disease, unspecified: Secondary | ICD-10-CM

## 2020-04-21 DIAGNOSIS — M79675 Pain in left toe(s): Secondary | ICD-10-CM | POA: Diagnosis not present

## 2020-04-21 DIAGNOSIS — M79674 Pain in right toe(s): Secondary | ICD-10-CM | POA: Diagnosis not present

## 2020-04-21 DIAGNOSIS — E1149 Type 2 diabetes mellitus with other diabetic neurological complication: Secondary | ICD-10-CM

## 2020-04-21 NOTE — Progress Notes (Signed)
This patient returns to my office for at risk foot care.  This patient requires this care by a professional since this patient will be at risk due to having chronic kidney disease and neuropathy.  Patient presents to the office with her daughter.  This patient is unable to cut nails herself since the patient cannot reach her nails.These nails are painful walking and wearing shoes.  This patient presents for at risk foot care today.  General Appearance  Alert, conversant and in no acute stress.  Vascular  Dorsalis pedis and posterior tibial  pulses are weakly  palpable  bilaterally.  Capillary return is within normal limits  bilaterally. Temperature is within normal limits  bilaterally.  Neurologic  Senn-Weinstein monofilament wire test within normal limits  bilaterally. Muscle power within normal limits bilaterally.  Nails Thick disfigured discolored nails with subungual debris  from hallux to fifth toes bilaterally. Pincer nails  B/L.   No evidence of bacterial infection or drainage bilaterally.  Orthopedic  No limitations of motion  feet .  No crepitus or effusions noted.  No bony pathology or digital deformities noted.  HAV  B/L.  Skin  normotropic skin with no porokeratosis noted bilaterally.  No signs of infections or ulcers noted.     Onychomycosis  Pain in right toes  Pain in left toes  Consent was obtained for treatment procedures.   Mechanical debridement of nails 1-5  bilaterally performed with a nail nipper.  Filed with dremel without incident. No infection or ulcer.    Return office visit    10 weeks                  Told patient to return for periodic foot care and evaluation due to potential at risk complications.   Helane Gunther DPM

## 2020-05-01 ENCOUNTER — Encounter: Payer: Self-pay | Admitting: Dermatology

## 2020-05-16 DIAGNOSIS — R32 Unspecified urinary incontinence: Secondary | ICD-10-CM | POA: Diagnosis not present

## 2020-05-16 DIAGNOSIS — R3914 Feeling of incomplete bladder emptying: Secondary | ICD-10-CM | POA: Diagnosis not present

## 2020-05-16 DIAGNOSIS — R339 Retention of urine, unspecified: Secondary | ICD-10-CM | POA: Diagnosis not present

## 2020-05-16 DIAGNOSIS — N3941 Urge incontinence: Secondary | ICD-10-CM | POA: Diagnosis not present

## 2020-05-17 ENCOUNTER — Telehealth: Payer: Self-pay | Admitting: Family Medicine

## 2020-05-17 DIAGNOSIS — E7800 Pure hypercholesterolemia, unspecified: Secondary | ICD-10-CM

## 2020-05-17 DIAGNOSIS — I10 Essential (primary) hypertension: Secondary | ICD-10-CM

## 2020-05-17 DIAGNOSIS — R7309 Other abnormal glucose: Secondary | ICD-10-CM

## 2020-05-17 NOTE — Telephone Encounter (Signed)
-----   Message from Aquilla Solian, RT sent at 05/04/2020  2:01 PM EST ----- Regarding: Lab Orders for Wednesday 12.15.2021 Please place lab orders for Wednesday 12.15.2021, office visit for physical on Friday 12.17.2021 Thank you, Jones Bales RT(R)

## 2020-05-18 ENCOUNTER — Other Ambulatory Visit (INDEPENDENT_AMBULATORY_CARE_PROVIDER_SITE_OTHER): Payer: Medicare PPO

## 2020-05-18 ENCOUNTER — Other Ambulatory Visit: Payer: Self-pay | Admitting: Family Medicine

## 2020-05-18 ENCOUNTER — Other Ambulatory Visit: Payer: Self-pay

## 2020-05-18 DIAGNOSIS — E7800 Pure hypercholesterolemia, unspecified: Secondary | ICD-10-CM

## 2020-05-18 DIAGNOSIS — I10 Essential (primary) hypertension: Secondary | ICD-10-CM

## 2020-05-18 DIAGNOSIS — R7309 Other abnormal glucose: Secondary | ICD-10-CM

## 2020-05-18 DIAGNOSIS — R739 Hyperglycemia, unspecified: Secondary | ICD-10-CM

## 2020-05-18 LAB — LIPID PANEL
Cholesterol: 174 mg/dL (ref 0–200)
HDL: 37.7 mg/dL — ABNORMAL LOW (ref >39.00)
LDL Cholesterol: 123 mg/dL — ABNORMAL HIGH (ref 0–99)
NonHDL: 136.36
Total CHOL/HDL Ratio: 5
Triglycerides: 67 mg/dL (ref 0.0–149.0)
VLDL: 13.4 mg/dL (ref 0.0–40.0)

## 2020-05-18 LAB — COMPREHENSIVE METABOLIC PANEL
ALT: 11 U/L (ref 0–35)
AST: 22 U/L (ref 0–37)
Albumin: 3.5 g/dL (ref 3.5–5.2)
Alkaline Phosphatase: 94 U/L (ref 39–117)
BUN: 29 mg/dL — ABNORMAL HIGH (ref 6–23)
CO2: 30 mEq/L (ref 19–32)
Calcium: 9.2 mg/dL (ref 8.4–10.5)
Chloride: 102 mEq/L (ref 96–112)
Creatinine, Ser: 1.39 mg/dL — ABNORMAL HIGH (ref 0.40–1.20)
GFR: 31.55 mL/min — ABNORMAL LOW (ref >60.00)
Glucose, Bld: 89 mg/dL (ref 70–99)
Potassium: 4.6 mEq/L (ref 3.5–5.1)
Sodium: 140 mEq/L (ref 135–145)
Total Bilirubin: 0.4 mg/dL (ref 0.2–1.2)
Total Protein: 7 g/dL (ref 6.0–8.3)

## 2020-05-18 LAB — CBC WITH DIFFERENTIAL/PLATELET
Basophils Absolute: 0.1 10*3/uL (ref 0.0–0.1)
Basophils Relative: 1.2 % (ref 0.0–3.0)
Eosinophils Absolute: 0.2 10*3/uL (ref 0.0–0.7)
Eosinophils Relative: 3.8 % (ref 0.0–5.0)
HCT: 39.1 % (ref 36.0–46.0)
Hemoglobin: 13 g/dL (ref 12.0–15.0)
Lymphocytes Relative: 34.5 % (ref 12.0–46.0)
Lymphs Abs: 1.7 10*3/uL (ref 0.7–4.0)
MCHC: 33.1 g/dL (ref 30.0–36.0)
MCV: 87.6 fl (ref 78.0–100.0)
Monocytes Absolute: 0.7 10*3/uL (ref 0.1–1.0)
Monocytes Relative: 14.7 % — ABNORMAL HIGH (ref 3.0–12.0)
Neutro Abs: 2.3 10*3/uL (ref 1.4–7.7)
Neutrophils Relative %: 45.8 % (ref 43.0–77.0)
Platelets: 266 10*3/uL (ref 150.0–400.0)
RBC: 4.46 Mil/uL (ref 3.87–5.11)
RDW: 14.7 % (ref 11.5–15.5)
WBC: 4.9 10*3/uL (ref 4.0–10.5)

## 2020-05-18 LAB — TSH: TSH: 3.66 u[IU]/mL (ref 0.35–4.50)

## 2020-05-18 LAB — HEMOGLOBIN A1C: Hgb A1c MFr Bld: 5.9 % (ref 4.6–6.5)

## 2020-05-20 ENCOUNTER — Ambulatory Visit (INDEPENDENT_AMBULATORY_CARE_PROVIDER_SITE_OTHER): Payer: Medicare PPO | Admitting: Family Medicine

## 2020-05-20 ENCOUNTER — Other Ambulatory Visit: Payer: Self-pay

## 2020-05-20 ENCOUNTER — Encounter: Payer: Self-pay | Admitting: Family Medicine

## 2020-05-20 VITALS — BP 138/70 | HR 69 | Temp 96.9°F | Wt 122.1 lb

## 2020-05-20 DIAGNOSIS — R609 Edema, unspecified: Secondary | ICD-10-CM | POA: Diagnosis not present

## 2020-05-20 DIAGNOSIS — N189 Chronic kidney disease, unspecified: Secondary | ICD-10-CM | POA: Diagnosis not present

## 2020-05-20 DIAGNOSIS — I809 Phlebitis and thrombophlebitis of unspecified site: Secondary | ICD-10-CM | POA: Diagnosis not present

## 2020-05-20 DIAGNOSIS — Z0000 Encounter for general adult medical examination without abnormal findings: Secondary | ICD-10-CM | POA: Diagnosis not present

## 2020-05-20 DIAGNOSIS — E7800 Pure hypercholesterolemia, unspecified: Secondary | ICD-10-CM

## 2020-05-20 DIAGNOSIS — R7309 Other abnormal glucose: Secondary | ICD-10-CM

## 2020-05-20 DIAGNOSIS — G3184 Mild cognitive impairment, so stated: Secondary | ICD-10-CM | POA: Diagnosis not present

## 2020-05-20 DIAGNOSIS — Z0001 Encounter for general adult medical examination with abnormal findings: Secondary | ICD-10-CM | POA: Diagnosis not present

## 2020-05-20 DIAGNOSIS — I10 Essential (primary) hypertension: Secondary | ICD-10-CM | POA: Diagnosis not present

## 2020-05-20 MED ORDER — DONEPEZIL HCL 5 MG PO TABS: 5.0000 mg | ORAL_TABLET | Freq: Every day | ORAL | 3 refills | Status: DC

## 2020-05-20 NOTE — Patient Instructions (Addendum)
pepcid over the counter -once daily -try for cough that may be from acid reflux  Please work on an advance directive (blue booklet) and get it notarized   Kidney numbers are up -please drink more fluids  64 oz of fluid daily is the goal  See Dr Thedore Mins in march as planned   It is good to see the eye doctor yearly   I think you have phlebitis in right leg (inflamed vein)  Wear support hose if you can tolerate them Also elevate feet when you sit

## 2020-05-20 NOTE — Progress Notes (Signed)
Subjective:    Patient ID: Tracy Morales, female    DOB: [birth year], 86 y.o.   MRN: [id number]  This visit occurred during the SARS-CoV-2 public health emergency.  Safety protocols were in place, including screening questions prior to the visit, additional usage of staff PPE, and extensive cleaning of exam room while observing appropriate contact time as indicated for disinfecting solutions.    HPI Pt presents for amw and health mt exam   I have personally reviewed the Medicare Annual Wellness questionnaire and have noted 1. The patient's medical and social history 2. Their use of alcohol, tobacco or illicit drugs 3. Their current medications and supplements 4. The patient's functional ability including ADL's, fall risks, home safety risks and hearing or visual             impairment. 5. Diet and physical activities 6. Evidence for depression or mood disorders  The patients weight, height, BMI have been recorded in the chart and visual acuity is per eye clinic.  I have made referrals, counseling and provided education to the patient based review of the above and I have provided the pt with a written personalized care plan for preventive services. Reviewed and updated provider list, see scanned forms.  See scanned forms.  Routine anticipatory guidance given to patient.  See health maintenance. Colon cancer screening-out aged Breast cancer screening declines due to age  Self breast exam no lumps  Flu vaccine 9/21 Tetanus vaccine 5/13 Tdap  Pneumovax done covid vaccines pfizer with booster Zoster vaccine had shigrix vaccines Dexa 2/01   Declines now  Falls- none recently    Fractures-none  Supplements-takes vit D Exercise - mobility issues limit her  Advance directive- not living will , given materials  Cognitive function addressed- see scanned forms- and if abnormal then additional documentation follows.   Memory is worse   PMH and SH reviewed  Meds, vitals, and  allergies reviewed.   ROS: See HPI.  Otherwise negative.    Feeling fair  Not doing a lot  Family won't let her do anything   Some cough when she eats sometimes  occ tums  No trouble swallowing   Still has chronic itching (? Fungal) Sees derm-lots of treatments/topical  ? If itching    Weight : Wt Readings from Last 3 Encounters:  05/20/20 122 lb 1 oz (55.4 kg)  02/19/20 123 lb (55.8 kg)  09/09/19 127 lb 7 oz (57.8 kg)   20.95 kg/m Eats breakfast and lunch  Nothing in the evening  Ensure supplement at least twice daily   Hearing/vision:  Hearing Screening   125Hz  250Hz  500Hz  1000Hz  2000Hz  3000Hz  4000Hz  6000Hz  8000Hz   Right ear:           Left ear:           Comments: Has hearing aids  Vision Screening Comments: [facility] exams at [facility] has not been in 2 years -wears glasses  Care team [facility] Arnall-pcp singh-nephrology Wolff-urology  Hyatt-podiatry  Pelletier-audiology   HTN bp is up today (nl at home)  No cp or palpitations or headaches or edema  No side effects to medicines  BP Readings from Last 3 Encounters:  05/20/20 138/70  02/19/20 130/82  01/15/20 128/82    Taking amlodipine 5 mg daily   CKD Lab Results  Component Value Date   CREATININE 1.39 (H) 05/18/2020   BUN 29 (H) 05/18/2020   NA 140 05/18/2020   K 4.6 05/18/2020   CL 102 05/18/2020  CO2 30 05/18/2020  has appt with nephrology in march   Hyperlipidemia Lab Results  Component Value Date   CHOL 174 05/18/2020   CHOL 167 05/12/2019   CHOL 160 05/06/2018   Lab Results  Component Value Date   HDL 37.70 (L) 05/18/2020   HDL 40.20 05/12/2019   HDL 37.60 (L) 05/06/2018   Lab Results  Component Value Date   LDLCALC 123 (H) 05/18/2020   LDLCALC 113 (H) 05/12/2019   LDLCALC 107 (H) 05/06/2018   Lab Results  Component Value Date   TRIG 67.0 05/18/2020   TRIG 68.0 05/12/2019   TRIG 73.0 05/06/2018   Lab Results  Component Value Date   CHOLHDL 5 05/18/2020   CHOLHDL  4 05/12/2019   CHOLHDL 4 05/06/2018   No results found for: LDLDIRECT Takes simvastatin 20 mg daily  Does eat sausage and bacon   Edema -not as problematic  Elevated glucose in past Lab Results  Component Value Date   HGBA1C 5.9 05/18/2020   Patient Active Problem List   Diagnosis Date Noted  . Phlebitis 05/20/2020  . Left leg pain 02/19/2020  . Ankle sprain 01/15/2020  . Seborrheic dermatitis of scalp 08/27/2019  . Elevated random blood glucose level 05/11/2019  . Pain due to onychomycosis of toenails of both feet 12/04/2018  . Recurrent UTI 09/22/2018  . Low back pain 09/15/2018  . MCI (mild cognitive impairment) 05/11/2018  . Frequent urination 02/28/2018  . Mobility impaired 05/06/2017  . Osteoarthritis of both hips 04/15/2015  . Hip pain 04/14/2015  . Routine general medical examination at a health care facility 04/12/2015  . Hair loss 11/19/2014  . Loss of weight 06/09/2014  . Arthritis of right shoulder region 06/09/2014  . Nodule of left lung 06/09/2014  . Encounter for Medicare annual wellness exam 10/30/2013  . H/O fall 04/27/2013  . Risk for falls 04/11/2012  . Leg pain, bilateral 01/29/2012  . MICROSCOPIC HEMATURIA 08/29/2009  . CONDUCTIVE HEARING LOSS BILATERAL 08/08/2009  . EDEMA 04/07/2009  . BRADYCARDIA 10/26/2008  . DYSPNEA 10/26/2008  . INCONTINENCE, URGE 02/27/2008  . Hyperlipidemia 10/11/2006  . PERIPHERAL NEUROPATHY, LOWER EXTREMITIES, BILATERAL 10/11/2006  . Essential hypertension 10/11/2006  . DIASTOLIC DYSFUNCTION 10/11/2006  . CAROTID STENOSIS 10/11/2006  . CEREBROVASCULAR DISEASE 10/11/2006  . PERIPHERAL VASCULAR DISEASE 10/11/2006  . ALLERGIC RHINITIS 10/11/2006  . Chronic kidney disease 10/11/2006  . OVERACTIVE BLADDER 10/11/2006  . OSTEOARTHRITIS 10/11/2006  . CARDIAC MURMUR, HX OF 10/11/2006   Past Medical History:  Diagnosis Date  . Allergic rhinitis, cause unspecified   . Bradycardia   . Cerebrovascular disease, unspecified    . Chronic kidney disease, unspecified    chronic renal insufficiency  . Dizziness and giddiness   . DVT (deep venous thrombosis) (HCC)    R  . Dyspnea    echo with normal EF. +diastolic dysfunction  . 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 HTN (hypertension)   . Hyperlipidemia   . Hypertonicity of bladder    hx  . Mild depression (HCC)   . Mononeuritis of lower limb, unspecified    peripheral neuropathy, bilateral  . Occlusion and stenosis of carotid artery without mention of cerebral infarction 4/09   u/s L 40-59%, R 0-39%; stable for many years  . Osteoarthrosis, unspecified whether generalized or localized, unspecified site   . Peripheral vascular disease, unspecified (HCC)   . Polymyalgia rheumatica (HCC)    hx  . RUQ pain   . Urge incontinence  Past Surgical History:  Procedure Laterality Date  . 2D echo  8/06   mild aortic stenosis.   Marland Kitchen arterial doppler    . carotid doppler  4/02   bilateral plaque   . carotid doppler  8/06   non significant stenosis  . carotid doppler     R 40%; L 40-60%  . CATARACT EXTRACTION    . CHOLECYSTECTOMY  2000  . COLONOSCOPY  1/04   polyps  . CT SCAN  05/25/08   abd and pelvis- inflated urinary bladder and large amt stool throughout w/o blockage   . dexa  2/01   osteopenia  . echo     mild LVH EF 50% As  . triger finger contracture  6/04  . vert. basilar insufficiency  2002  . zoster  5/02   Social History   Tobacco Use  . Smoking status: Never Smoker  . Smokeless tobacco: Never Used  . Tobacco comment: non smoker   Vaping Use  . Vaping Use: Never used  Substance Use Topics  . Alcohol use: No    Alcohol/week: 0.0 standard drinks  . Drug use: No   Family History  Problem Relation Age of Onset  . Hypertension Father   . Other Father        Cardiac problems  . Heart disease Father   . Other Brother        blood clot  . Other Sister        benign breast nodule   Allergies  Allergen Reactions  . Ace  Inhibitors     REACTION: cough  . Sulfa Antibiotics Nausea Only    headache   Current Outpatient Medications on File Prior to Visit  Medication Sig Dispense Refill  . acetaminophen (TYLENOL) 500 MG tablet Take 500 mg by mouth every 6 (six) hours as needed. OTC-UAD    . amLODipine (NORVASC) 5 MG tablet TAKE 1 TABLET BY MOUTH EVERY DAY 90 tablet 1  . aspirin 325 MG EC tablet Take 325 mg by mouth daily.    . Cholecalciferol (VITAMIN D3) 2000 units capsule Take 2,000 Units by mouth daily.    . clobetasol (TEMOVATE) 0.05 % external solution apply to scalp once daily as needed for itch. Use on SCALP. Avoid FACE / GROIN / UNDERARM 50 mL 1  . CRANBERRY PO Take 1 tablet by mouth daily.    . ferrous sulfate 325 (65 FE) MG tablet Take 1 tablet (325 mg total) by mouth 2 (two) times daily. 60 tablet 11  . hydrocortisone 2.5 % cream apply to affected area on Eyebrows twice daily for up to 2 week. Use on FACE 28 g 3  . ketoconazole (NIZORAL) 2 % shampoo message into scalp 3 times a week, leave on for 10 minutes 120 mL 2  . Meclizine HCl (BONINE PO) Take by mouth as needed.    . mirtazapine (REMERON) 30 MG tablet TAKE 1 TABLET BY MOUTH EVERYDAY AT BEDTIME 90 tablet 1  . Multiple Vitamin (MULTIVITAMIN) tablet Take 1 tablet by mouth daily.    . NON FORMULARY Walker with wheels - for use with ambulation once daily. 429.9, 715.90, 355.9    . psyllium (METAMUCIL) 58.6 % packet Take 1 packet by mouth daily.    . simvastatin (ZOCOR) 20 MG tablet TAKE 1 TABLET BY MOUTH EVERY DAY 90 tablet 1   No current facility-administered medications on file prior to visit.    Review of Systems  Constitutional: Negative for activity change, appetite  change, fatigue, fever and unexpected weight change.  HENT: Negative for congestion, ear pain, rhinorrhea, sinus pressure and sore throat.   Eyes: Negative for pain, redness and visual disturbance.  Respiratory: Negative for cough, shortness of breath and wheezing.    Cardiovascular: Negative for chest pain, palpitations and leg swelling.       Varicose veins  Gastrointestinal: Negative for abdominal pain, blood in stool, constipation and diarrhea.  Endocrine: Negative for polydipsia and polyuria.  Genitourinary: Negative for dysuria, frequency and urgency.  Musculoskeletal: Positive for arthralgias and back pain. Negative for myalgias.  Skin: Negative for pallor and rash.  Allergic/Immunologic: Negative for environmental allergies.  Neurological: Negative for dizziness, syncope and headaches.  Hematological: Negative for adenopathy. Does not bruise/bleed easily.  Psychiatric/Behavioral: Positive for confusion and decreased concentration. Negative for dysphoric mood. The patient is not nervous/anxious.        Occ confused       Objective:   Physical Exam Constitutional:      General: She is not in acute distress.    Appearance: Normal appearance. She is well-developed and normal weight. She is not ill-appearing or diaphoretic.     Comments: Frail appearing elderly female  HENT:     Head: Normocephalic and atraumatic.     Right Ear: Tympanic membrane, ear canal and external ear normal.     Left Ear: Tympanic membrane, ear canal and external ear normal.     Nose: Nose normal. No congestion.     Mouth/Throat:     Mouth: Mucous membranes are moist.     Pharynx: Oropharynx is clear. No posterior oropharyngeal erythema.  Eyes:     General: No scleral icterus.    Extraocular Movements: Extraocular movements intact.     Conjunctiva/sclera: Conjunctivae normal.     Pupils: Pupils are equal, round, and reactive to light.  Neck:     Thyroid: No thyromegaly.     Vascular: No carotid bruit or JVD.  Cardiovascular:     Rate and Rhythm: Normal rate and regular rhythm.     Pulses: Normal pulses.     Heart sounds: Normal heart sounds. No gallop.      Comments: Inflamed varicose vein lower R leg-compressible and nt Pulmonary:     Effort: Pulmonary  effort is normal. No respiratory distress.     Breath sounds: Normal breath sounds. No wheezing.     Comments: Good air exch Chest:     Chest wall: No tenderness.  Abdominal:     General: Bowel sounds are normal. There is no distension or abdominal bruit.     Palpations: Abdomen is soft. There is no mass.     Tenderness: There is no abdominal tenderness.     Hernia: No hernia is present.  Genitourinary:    Comments: Breast exam: No mass, nodules, thickening, tenderness, bulging, retraction, inflamation, nipple discharge or skin changes noted.  No axillary or clavicular LA.      Exam done sitting Musculoskeletal:        General: No tenderness. Normal range of motion.     Cervical back: Normal range of motion and neck supple. No rigidity. No muscular tenderness.     Right lower leg: No edema.     Left lower leg: No edema.     Comments: Kyphosis  Poor rom spine    Lymphadenopathy:     Cervical: No cervical adenopathy.  Skin:    General: Skin is warm and dry.     Coloration: Skin is not  pale.     Findings: No erythema or rash.     Comments: No pallor  Neurological:     Mental Status: She is alert. Mental status is at baseline.     Cranial Nerves: No cranial nerve deficit.     Motor: No abnormal muscle tone.     Coordination: Coordination normal.     Gait: Gait normal.     Deep Tendon Reflexes: Reflexes are normal and symmetric. Reflexes normal.  Psychiatric:        Mood and Affect: Mood normal.        Cognition and Memory: Memory is impaired. She exhibits impaired recent memory.     Comments: Pleasant            Assessment & Plan:   Problem List Items Addressed This Visit      Cardiovascular and Mediastinum   Essential hypertension    bp in fair control at this time  BP Readings from Last 1 Encounters:  05/20/20 138/70   No changes needed Most recent labs reviewed  Disc lifstyle change with low sodium diet and exercise  Plan to continue amlodipine 5 mg daily       Phlebitis    Some erythema (not tender) over varicosity on lower R leg Recommend compression and elevation  Watch for spreading redness or swelling and update         Genitourinary   Chronic kidney disease    Cr of 1.39 Encouraged more water  F/u with nephrologist in march as planned Is avoiding nephro toxic meds        Other   Hyperlipidemia    Fair control with simvastatin 20 mg  LDL of 123 At her age-hesitant to advance  Disc goals for lipids and reasons to control them Rev last labs with pt Rev low sat fat diet in detail       EDEMA    Improved recently Enc elevatio of legs Hesitant to wear supp hose but would help      Encounter for Medicare annual wellness exam - Primary    Reviewed health habits including diet and exercise and skin cancer prevention Reviewed appropriate screening tests for age  Also reviewed health mt list, fam hx and immunization status , as well as social and family history   See HPI Labs reviewed  Declines breast cancer screening and dexa due to age  covid immunized with booster  Had flu vaccine and shingrix vaccines  No falls or fractures  MCI-memory is worse- disc adding aricept 5 mg to see how tolerated Uses hearing aides and due for vision exam /wears glasses       Encounter for general adult medical examination with abnormal findings    Reviewed health habits including diet and exercise and skin cancer prevention Reviewed appropriate screening tests for age  Also reviewed health mt list, fam hx and immunization status , as well as social and family history   See HPI Labs reviewed  Declines breast cancer screening and dexa due to age  covid immunized with booster  Had flu vaccine and shingrix vaccines  No falls or fractures  MCI-memory is worse- disc adding aricept 5 mg to see how tolerated Uses hearing aides and due for vision exam /wears glasses      MCI (mild cognitive impairment)    Notes worsening short term  memory (occ confusion)  Caregiver notes at night worse  Plan to try aricept 5 mg at bedtime to slow progression and  if tolerated can titrate to 10 mg       Elevated random blood glucose level    Lab Results  Component Value Date   HGBA1C 5.9 05/18/2020   disc imp of low glycemic diet and wt loss to prevent DM2

## 2020-05-22 NOTE — Assessment & Plan Note (Signed)
Notes worsening short term memory (occ confusion)  Caregiver notes at night worse  Plan to try aricept 5 mg at bedtime to slow progression and if tolerated can titrate to 10 mg

## 2020-05-22 NOTE — Assessment & Plan Note (Signed)
Reviewed health habits including diet and exercise and skin cancer prevention Reviewed appropriate screening tests for age  Also reviewed health mt list, fam hx and immunization status , as well as social and family history   See HPI Labs reviewed  Declines breast cancer screening and dexa due to age  covid immunized with booster  Had flu vaccine and shingrix vaccines  No falls or fractures  MCI-memory is worse- disc adding aricept 5 mg to see how tolerated Uses hearing aides and due for vision exam /wears glasses 

## 2020-05-22 NOTE — Assessment & Plan Note (Signed)
Cr of 1.39 Encouraged more water  F/u with nephrologist in march as planned Is avoiding nephro toxic meds

## 2020-05-22 NOTE — Assessment & Plan Note (Signed)
Reviewed health habits including diet and exercise and skin cancer prevention Reviewed appropriate screening tests for age  Also reviewed health mt list, fam hx and immunization status , as well as social and family history   See HPI Labs reviewed  Declines breast cancer screening and dexa due to age  covid immunized with booster  Had flu vaccine and shingrix vaccines  No falls or fractures  MCI-memory is worse- disc adding aricept 5 mg to see how tolerated Uses hearing aides and due for vision exam /wears glasses

## 2020-05-22 NOTE — Assessment & Plan Note (Signed)
bp in fair control at this time  BP Readings from Last 1 Encounters:  05/20/20 138/70   No changes needed Most recent labs reviewed  Disc lifstyle change with low sodium diet and exercise  Plan to continue amlodipine 5 mg daily

## 2020-05-22 NOTE — Assessment & Plan Note (Signed)
Fair control with simvastatin 20 mg  LDL of 123 At her age-hesitant to advance  Disc goals for lipids and reasons to control them Rev last labs with pt Rev low sat fat diet in detail

## 2020-05-22 NOTE — Assessment & Plan Note (Signed)
Improved recently Enc elevatio of legs Hesitant to wear supp hose but would help

## 2020-05-22 NOTE — Assessment & Plan Note (Signed)
Some erythema (not tender) over varicosity on lower R leg Recommend compression and elevation  Watch for spreading redness or swelling and update

## 2020-05-22 NOTE — Assessment & Plan Note (Signed)
Lab Results  Component Value Date   HGBA1C 5.9 05/18/2020   disc imp of low glycemic diet and wt loss to prevent DM2

## 2020-05-31 ENCOUNTER — Other Ambulatory Visit: Payer: Self-pay | Admitting: Dermatology

## 2020-05-31 DIAGNOSIS — L219 Seborrheic dermatitis, unspecified: Secondary | ICD-10-CM

## 2020-06-14 ENCOUNTER — Telehealth: Payer: Self-pay | Admitting: *Deleted

## 2020-06-14 NOTE — Telephone Encounter (Signed)
Patient's daughter left a voicemail stating that her mom was seen in December and started on Donepezil. Patient's daughter stated that her mom has been having diarrhea since starting this medication. Tracy Morales wants to know if she should give her mom something for the diarrhea or what she should do.

## 2020-06-14 NOTE — Telephone Encounter (Signed)
It can be a side effect so go ahead and stop it.  Please let me know if diarrhea does not resolve within a week after stopping it.  Thanks for letting me know

## 2020-06-14 NOTE — Telephone Encounter (Signed)
Pt's daughter notified of Dr. Royden Purl comments and verbalized understanding, she will update [place] next week

## 2020-06-16 DIAGNOSIS — R32 Unspecified urinary incontinence: Secondary | ICD-10-CM | POA: Diagnosis not present

## 2020-06-16 DIAGNOSIS — N3941 Urge incontinence: Secondary | ICD-10-CM | POA: Diagnosis not present

## 2020-06-16 DIAGNOSIS — R3914 Feeling of incomplete bladder emptying: Secondary | ICD-10-CM | POA: Diagnosis not present

## 2020-06-16 DIAGNOSIS — R339 Retention of urine, unspecified: Secondary | ICD-10-CM | POA: Diagnosis not present

## 2020-06-21 ENCOUNTER — Telehealth: Payer: Self-pay | Admitting: Family Medicine

## 2020-06-21 NOTE — Telephone Encounter (Signed)
Patients daughter called back to say since she took her off of the Donepezil that the diarrhea has stopped along with the stomach cramps. Just wanted to make you aware EM

## 2020-06-22 NOTE — Telephone Encounter (Signed)
Thanks for letting me know. Stay off of it and please add to the intolerance list

## 2020-06-22 NOTE — Addendum Note (Signed)
Addended by: Shon Millet on: 06/22/2020 12:50 PM   Modules accepted: Orders

## 2020-06-22 NOTE — Telephone Encounter (Signed)
Daughter notified of Dr. Royden Purl comments. She will keep pt off of med and med removed from med list and added to allergy list

## 2020-06-30 ENCOUNTER — Ambulatory Visit: Payer: Medicare PPO | Admitting: Podiatry

## 2020-07-04 ENCOUNTER — Ambulatory Visit: Payer: Medicare PPO | Admitting: Podiatry

## 2020-07-04 ENCOUNTER — Encounter: Payer: Self-pay | Admitting: Podiatry

## 2020-07-04 ENCOUNTER — Other Ambulatory Visit: Payer: Self-pay

## 2020-07-04 DIAGNOSIS — M79674 Pain in right toe(s): Secondary | ICD-10-CM | POA: Diagnosis not present

## 2020-07-04 DIAGNOSIS — M79675 Pain in left toe(s): Secondary | ICD-10-CM

## 2020-07-04 DIAGNOSIS — B351 Tinea unguium: Secondary | ICD-10-CM

## 2020-07-04 DIAGNOSIS — N189 Chronic kidney disease, unspecified: Secondary | ICD-10-CM | POA: Diagnosis not present

## 2020-07-04 DIAGNOSIS — E1149 Type 2 diabetes mellitus with other diabetic neurological complication: Secondary | ICD-10-CM

## 2020-07-04 DIAGNOSIS — L608 Other nail disorders: Secondary | ICD-10-CM

## 2020-07-04 NOTE — Progress Notes (Signed)
This patient returns to my office for at risk foot care.  This patient requires this care by a professional since this patient will be at risk due to having chronic kidney disease and neuropathy.  Patient presents to the office with her daughter.  This patient is unable to cut nails herself since the patient cannot reach her nails.These nails are painful walking and wearing shoes.  This patient presents for at risk foot care today.  General Appearance  Alert, conversant and in no acute stress.  Vascular  Dorsalis pedis and posterior tibial  pulses are weakly  palpable  bilaterally.  Capillary return is within normal limits  bilaterally. Absent digital hair  bilaterally.  Neurologic  Senn-Weinstein monofilament wire test within normal limits  bilaterally. Muscle power within normal limits bilaterally.  Nails Thick disfigured discolored nails with subungual debris  from hallux to fifth toes bilaterally. Pincer nails  B/L.   No evidence of bacterial infection or drainage bilaterally.  Orthopedic  No limitations of motion  feet .  No crepitus or effusions noted.  No bony pathology or digital deformities noted.  HAV  B/L.  Skin  normotropic skin with no porokeratosis noted bilaterally.  No signs of infections or ulcers noted.     Onychomycosis  Pain in right toes  Pain in left toes  Consent was obtained for treatment procedures.   Mechanical debridement of nails 1-5  bilaterally performed with a nail nipper.  Filed with dremel without incident. No infection or ulcer.    Return office visit    10 weeks                  Told patient to return for periodic foot care and evaluation due to potential at risk complications.   Helane Gunther DPM

## 2020-07-11 DIAGNOSIS — H52223 Regular astigmatism, bilateral: Secondary | ICD-10-CM | POA: Diagnosis not present

## 2020-07-11 DIAGNOSIS — Z9842 Cataract extraction status, left eye: Secondary | ICD-10-CM | POA: Diagnosis not present

## 2020-07-11 DIAGNOSIS — Z9841 Cataract extraction status, right eye: Secondary | ICD-10-CM | POA: Diagnosis not present

## 2020-07-14 ENCOUNTER — Other Ambulatory Visit: Payer: Self-pay | Admitting: Dermatology

## 2020-07-14 ENCOUNTER — Ambulatory Visit: Payer: Medicare PPO | Admitting: Dermatology

## 2020-07-14 ENCOUNTER — Other Ambulatory Visit: Payer: Self-pay

## 2020-07-14 DIAGNOSIS — L219 Seborrheic dermatitis, unspecified: Secondary | ICD-10-CM

## 2020-07-14 MED ORDER — CICLOPIROX OLAMINE 0.77 % EX SUSP: CUTANEOUS | 2 refills | Status: DC

## 2020-07-14 NOTE — Progress Notes (Signed)
   Follow-Up Visit   Subjective  Tracy Morales is a 87 y.o. female who presents for the following: Follow-up (Patient here today for 3 month seb derm follow up. She is using ketoconazole shampoo and clobetasol solution at scalp a few times a week and HC 2.5% cream at face when bothersome. Patient advises it is better but still has some itching at the scalp which is bothersome.).  The following portions of the chart were reviewed this encounter and updated as appropriate:   Tobacco  Allergies  Meds  Problems  Med Hx  Surg Hx  Fam Hx      Review of Systems:  No other skin or systemic complaints except as noted in HPI or Assessment and Plan.  Objective  Well appearing patient in no apparent distress; mood and affect are within normal limits.  A focused examination was performed including scalp and face. Relevant physical exam findings are noted in the Assessment and Plan.  Objective  Scalp: erythema and no scale   Assessment & Plan  Seborrheic dermatitis Scalp  Chronic condition with expected duration over one year. Condition is bothersome to patient. Currently flared despite treatment.  Itch is significantly bothersome to patient.  KOH negative.  Fungal culture sent today.  Start ciclopirox suspension three times weekly to scalp and leave in.  Continue ketoconazole 2% shampoo three times weekly  increase clobetasol solution to scalp to once to twice daily. Avoid applying to face, groin, and axilla. Use as directed. Risk of skin atrophy with long-term use reviewed.   Topical steroids (such as triamcinolone, fluocinolone, fluocinonide, mometasone, clobetasol, halobetasol, betamethasone, hydrocortisone) can cause thinning and lightening of the skin if they are used for too long in the same area. Your physician has selected the right strength medicine for your problem and area affected on the body. Please use your medication only as directed by your physician to prevent side  effects.    Other Related Procedures Fungus Culture W/Rfx Rapid ID  Ordered Medications: ciclopirox (LOPROX) 0.77 % SUSP  Other Related Medications hydrocortisone 2.5 % cream ketoconazole (NIZORAL) 2 % shampoo clobetasol (TEMOVATE) 0.05 % external solution  Return in about 6 weeks (around 08/25/2020) for seb derm.  Anise Salvo, RMA, am acting as scribe for Darden Dates, MD . Documentation: I have reviewed the above documentation for accuracy and completeness, and I agree with the above.  Darden Dates, MD

## 2020-07-14 NOTE — Patient Instructions (Addendum)
Recommend over the counter Selsun Blue or Head & Shoulders.   Start ciclopirox suspension three times weekly to scalp and leave in.  Continue ketoconazole 2% shampoo 1-2 times weekly  Continue clobetasol solution to scalp every day she has itch. Ok to skip on days with no itch.

## 2020-07-16 DIAGNOSIS — R32 Unspecified urinary incontinence: Secondary | ICD-10-CM | POA: Diagnosis not present

## 2020-07-16 DIAGNOSIS — R339 Retention of urine, unspecified: Secondary | ICD-10-CM | POA: Diagnosis not present

## 2020-07-18 ENCOUNTER — Encounter: Payer: Self-pay | Admitting: Dermatology

## 2020-08-08 ENCOUNTER — Other Ambulatory Visit: Payer: Self-pay | Admitting: Dermatology

## 2020-08-08 DIAGNOSIS — L219 Seborrheic dermatitis, unspecified: Secondary | ICD-10-CM

## 2020-08-15 DIAGNOSIS — R3914 Feeling of incomplete bladder emptying: Secondary | ICD-10-CM | POA: Diagnosis not present

## 2020-08-15 DIAGNOSIS — R32 Unspecified urinary incontinence: Secondary | ICD-10-CM | POA: Diagnosis not present

## 2020-08-15 DIAGNOSIS — N3941 Urge incontinence: Secondary | ICD-10-CM | POA: Diagnosis not present

## 2020-08-15 DIAGNOSIS — R339 Retention of urine, unspecified: Secondary | ICD-10-CM | POA: Diagnosis not present

## 2020-08-18 LAB — FUNGUS CULTURE W/RFX RAPID ID: Fungal Culture W/Rfx: NEGATIVE

## 2020-08-22 DIAGNOSIS — R3914 Feeling of incomplete bladder emptying: Secondary | ICD-10-CM | POA: Diagnosis not present

## 2020-08-22 DIAGNOSIS — R8271 Bacteriuria: Secondary | ICD-10-CM | POA: Diagnosis not present

## 2020-08-22 DIAGNOSIS — N390 Urinary tract infection, site not specified: Secondary | ICD-10-CM | POA: Diagnosis not present

## 2020-08-22 DIAGNOSIS — N399 Disorder of urinary system, unspecified: Secondary | ICD-10-CM | POA: Diagnosis not present

## 2020-08-24 ENCOUNTER — Telehealth: Payer: Self-pay

## 2020-08-24 NOTE — Telephone Encounter (Signed)
Patient advised culture negative for growth, continue seb derm treatment, JS

## 2020-08-29 DIAGNOSIS — N1832 Chronic kidney disease, stage 3b: Secondary | ICD-10-CM | POA: Diagnosis not present

## 2020-08-29 DIAGNOSIS — N320 Bladder-neck obstruction: Secondary | ICD-10-CM | POA: Diagnosis not present

## 2020-08-29 DIAGNOSIS — I10 Essential (primary) hypertension: Secondary | ICD-10-CM | POA: Diagnosis not present

## 2020-08-30 ENCOUNTER — Ambulatory Visit: Payer: Medicare PPO | Admitting: Dermatology

## 2020-08-30 ENCOUNTER — Encounter: Payer: Self-pay | Admitting: Dermatology

## 2020-08-30 ENCOUNTER — Other Ambulatory Visit: Payer: Self-pay

## 2020-08-30 DIAGNOSIS — L219 Seborrheic dermatitis, unspecified: Secondary | ICD-10-CM | POA: Diagnosis not present

## 2020-08-30 NOTE — Patient Instructions (Addendum)
Seborrheic Dermatitis  What is seborrheic dermatitis? Seborrheic (say: seb-oh-ree-ick) dermatitis is a disease that causes flaking of the skin.  It usually affects the scalp.  In teenagers and adults, it is commonly called "dandruff".  In infants, it is referred to as "cradle cap".  Dandruff often appears as scaling on the scalp with or without redness.  On other parts of the body, seborrheic dermatitis tends to produce both redness and scaling.  Other common locations of seborrheic dermatitis include the central face, eyebrows, chest, and the creases of the arms, legs, and groin.  It often causes the skin to look a little greasy, scaly, or flaky. Seborrheic dermatitis can occur at any age.  It often comes and goes and may to be seasonally related, especially in the Northern climates.  What causes seborrheic dermatitis? The exact cause is not known, though yeast of the Malassezia species may be involved.  This organism is normally present on the skin in small numbers, but sometimes its numbers increase, especially in oily skin.  Treatments that reduce the yeast tend to improve seborrheic dermatitis.  How is seborrheic dermatitis treated? The treatment of seborrheic dermatitis depends on its location on the body and the person's age. Seborrheic dermatitis of the scalp (dandruff) in adults and teenagers is usually treated with a medicated shampoo.  Here is a list of the medications that help, and the over-counter shampoos that contain them:  Salicylic acid (Neutrogena T/Sal, Sebulex, Scalpicin, Denorex Extra Strength)  Zinc pyrithione (Head & Shoulders white bottle, Denorex Daily, DHS Zinc, Pantene Pro-V Pyrithione Zinc)  Selenium sulfide (Head & Shoulders blue bottle, Selsun Blue, Exsel Lotion Shampoo, Glo-Sel)  [employer] tar (Neutrogena T/Gal, Pentrax, Zetar, Tegrin, [employer], Therapeutic Denorex)  Ketoconazole (Nizoral)  If you have dandruff, you might start by using one of these shampoos every day  until your dandruff is controlled and then keep using it at least twice a week.  Often times your doctor will recommend a rotation of several different medicated shampoos as some will experience a plateau in the effectiveness of any one shampoo.   When you use a dandruff shampoo, rub the shampoo into your wet hair and massage into scalp thoroughly.  Let it stay on your hair and scalp for 5 minutes before rinsing.  If you have involvement in the eyebrows or face, you can lather those areas with the medicated shampoo as well, or use a medicated soap (ZNP-bar, Polytar Soap, SAStid, or sulfur soap).    If the wash or shampoo alone does not help, your doctor might want you to use a prescription medication once or twice a day.  Leave-in medications for the scalp are best applied by massaging into the scalp immediately after towel drying your hair, but may be applied even if you have not washed your hair.  Seborrheic dermatitis in infants usually clears up by age 69 -34 months.  It may develop in the diaper area where it might be confused with diaper rash.  For milder cases you can try gently brushing out scales with a soft brush.  This is best done immediately after washing with a non-medicated baby shampoo Laural Benes and Gwen Her, etc.).  Your doctor may recommend a medicated shampoo or a prescription topical medication.    Cont Head and shoulder shampoo once a week,  Let it stay on your hair and scalp for 10 minutes before rinsing.    Cont Clobetasol solution apply to scalp once a day  Cont Hydrocortisone cream apply  to face daily for  2 weeks as needed for  flares

## 2020-08-30 NOTE — Progress Notes (Signed)
   Follow-Up Visit   Subjective  Tracy Morales is a 87 y.o. female who presents for the following: Dermatitis (6 weeks f/u seborrheic dermatitis on the scalp and face, treating with head and shoulders shampoo once a week, Clobetasol solution for the scalp and Hydrocortisone cream for the face, little improvement noticed ).  Daughter with pt  The following portions of the chart were reviewed this encounter and updated as appropriate:   Tobacco  Allergies  Meds  Problems  Med Hx  Surg Hx  Fam Hx      Review of Systems:  No other skin or systemic complaints except as noted in HPI or Assessment and Plan.  Objective  Well appearing patient in no apparent distress; mood and affect are within normal limits.  A focused examination was performed including face,scalp . Relevant physical exam findings are noted in the Assessment and Plan.  Objective  scalp and face: Clear today   Assessment & Plan  Seborrheic dermatitis scalp and face  Chronic condition with duration or expected duration over one year. Currently well-controlled.  Seborrheic Dermatitis  -  is a chronic persistent rash characterized by pinkness and scaling most commonly of the mid face but also can occur on the scalp (dandruff), ears; mid chest and mid back. It tends to be exacerbated by stress and cooler weather.  People who have neurologic disease may experience new onset or exacerbation of existing seborrheic dermatitis.  The condition is not curable but treatable and can be controlled.   Cont Head and shoulder shampoo once a week,  Let it stay on your hair and scalp for 10 minutes before rinsing.   Cont Clobetasol solution apply to scalp once a day as needed to itchy areas. Avoid applying to face, groin, and axilla. Use as directed. Risk of skin atrophy with long-term use reviewed.   Cont Hydrocortisone cream apply to face daily x 2 weeks prn flares   Topical steroids (such as triamcinolone, fluocinolone,  fluocinonide, mometasone, clobetasol, halobetasol, betamethasone, hydrocortisone) can cause thinning and lightening of the skin if they are used for too long in the same area. Your physician has selected the right strength medicine for your problem and area affected on the body. Please use your medication only as directed by your physician to prevent side effects.    Other Related Medications hydrocortisone 2.5 % cream ciclopirox (LOPROX) 0.77 % SUSP clobetasol (TEMOVATE) 0.05 % external solution  Return in about 6 months (around 03/02/2021) for seb dermatitis .  I, Angelique Holm, CMA, am acting as scribe for Darden Dates, MD .  Documentation: I have reviewed the above documentation for accuracy and completeness, and I agree with the above.  Darden Dates, MD

## 2020-09-12 ENCOUNTER — Other Ambulatory Visit: Payer: Self-pay

## 2020-09-12 ENCOUNTER — Ambulatory Visit: Payer: Medicare PPO | Admitting: Podiatry

## 2020-09-12 ENCOUNTER — Encounter: Payer: Self-pay | Admitting: Podiatry

## 2020-09-12 DIAGNOSIS — B351 Tinea unguium: Secondary | ICD-10-CM | POA: Diagnosis not present

## 2020-09-12 DIAGNOSIS — E1149 Type 2 diabetes mellitus with other diabetic neurological complication: Secondary | ICD-10-CM

## 2020-09-12 DIAGNOSIS — M79675 Pain in left toe(s): Secondary | ICD-10-CM | POA: Diagnosis not present

## 2020-09-12 DIAGNOSIS — M79674 Pain in right toe(s): Secondary | ICD-10-CM | POA: Diagnosis not present

## 2020-09-12 DIAGNOSIS — N189 Chronic kidney disease, unspecified: Secondary | ICD-10-CM

## 2020-09-12 DIAGNOSIS — L608 Other nail disorders: Secondary | ICD-10-CM

## 2020-09-12 NOTE — Progress Notes (Signed)
This patient returns to my office for at risk foot care.  This patient requires this care by a professional since this patient will be at risk due to having chronic kidney disease and neuropathy.  Patient presents to the office with her daughter.  This patient is unable to cut nails herself since the patient cannot reach her nails.These nails are painful walking and wearing shoes.  This patient presents for at risk foot care today.  General Appearance  Alert, conversant and in no acute stress.  Vascular  Dorsalis pedis and posterior tibial  pulses are weakly  palpable  bilaterally.  Capillary return is within normal limits  bilaterally. Absent digital hair  bilaterally.  Neurologic  Senn-Weinstein monofilament wire test within normal limits  bilaterally. Muscle power within normal limits bilaterally.  Nails Thick disfigured discolored nails with subungual debris  from hallux to fifth toes bilaterally. Pincer nails  B/L.   No evidence of bacterial infection or drainage bilaterally.  Orthopedic  No limitations of motion  feet .  No crepitus or effusions noted.  No bony pathology or digital deformities noted.  HAV  B/L.  Skin  normotropic skin with no porokeratosis noted bilaterally.  No signs of infections or ulcers noted.     Onychomycosis  Pain in right toes  Pain in left toes  Consent was obtained for treatment procedures.   Mechanical debridement of nails 1-5  bilaterally performed with a nail nipper.  Filed with dremel without incident. No infection or ulcer.    Return office visit    10 weeks                  Told patient to return for periodic foot care and evaluation due to potential at risk complications.   Helane Gunther DPM

## 2020-09-13 DIAGNOSIS — R32 Unspecified urinary incontinence: Secondary | ICD-10-CM | POA: Diagnosis not present

## 2020-09-13 DIAGNOSIS — R339 Retention of urine, unspecified: Secondary | ICD-10-CM | POA: Diagnosis not present

## 2020-09-18 ENCOUNTER — Other Ambulatory Visit: Payer: Self-pay | Admitting: Family Medicine

## 2020-09-19 NOTE — Telephone Encounter (Signed)
Refill request Remeron Last refill 03/29/20 #90/1 Last office visit 05/20/20 Upcoming appointment 05/22/21

## 2020-10-05 ENCOUNTER — Encounter: Payer: Self-pay | Admitting: Podiatry

## 2020-10-05 ENCOUNTER — Other Ambulatory Visit: Payer: Self-pay

## 2020-10-05 ENCOUNTER — Ambulatory Visit: Payer: Medicare PPO | Admitting: Podiatry

## 2020-10-05 DIAGNOSIS — M2011 Hallux valgus (acquired), right foot: Secondary | ICD-10-CM | POA: Diagnosis not present

## 2020-10-05 DIAGNOSIS — M2041 Other hammer toe(s) (acquired), right foot: Secondary | ICD-10-CM

## 2020-10-05 NOTE — Progress Notes (Signed)
Subjective:  Patient ID: Tracy Morales, female    DOB: 10-30-1919,  MRN: [id number] HPI No chief complaint on file.   87 y.o. female presents with the above complaint.   ROS: Denies fever chills nausea vomit muscle aches pains calf pain back pain chest pain shortness of breath presents today in wheelchair.  Past Medical History:  Diagnosis Date  . Allergic rhinitis, cause unspecified   . Bradycardia   . Cerebrovascular disease, unspecified   . Chronic kidney disease, unspecified    chronic renal insufficiency  . Dizziness and giddiness   . DVT (deep venous thrombosis) (HCC)    R  . Dyspnea    echo with normal EF. +diastolic dysfunction  . 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 HTN (hypertension)   . Hyperlipidemia   . Hypertonicity of bladder    hx  . Mild depression (HCC)   . Mononeuritis of lower limb, unspecified    peripheral neuropathy, bilateral  . Occlusion and stenosis of carotid artery without mention of cerebral infarction 4/09   u/s L 40-59%, R 0-39%; stable for many years  . Osteoarthrosis, unspecified whether generalized or localized, unspecified site   . Peripheral vascular disease, unspecified (HCC)   . Polymyalgia rheumatica (HCC)    hx  . RUQ pain   . Urge incontinence    Past Surgical History:  Procedure Laterality Date  . 2D echo  8/06   mild aortic stenosis.   Marland Kitchen arterial doppler    . carotid doppler  4/02   bilateral plaque   . carotid doppler  8/06   non significant stenosis  . carotid doppler     R 40%; L 40-60%  . CATARACT EXTRACTION    . CHOLECYSTECTOMY  2000  . COLONOSCOPY  1/04   polyps  . CT SCAN  05/25/08   abd and pelvis- inflated urinary bladder and large amt stool throughout w/o blockage   . dexa  2/01   osteopenia  . echo     mild LVH EF 50% As  . triger finger contracture  6/04  . vert. basilar insufficiency  2002  . zoster  5/02    Current Outpatient Medications:  .  acetaminophen (TYLENOL) 500 MG tablet,  Take 500 mg by mouth every 6 (six) hours as needed. OTC-UAD, Disp: , Rfl:  .  amLODipine (NORVASC) 5 MG tablet, TAKE 1 TABLET BY MOUTH EVERY DAY, Disp: 90 tablet, Rfl: 1 .  aspirin 325 MG EC tablet, Take 325 mg by mouth daily., Disp: , Rfl:  .  Cholecalciferol (VITAMIN D3) 2000 units capsule, Take 2,000 Units by mouth daily., Disp: , Rfl:  .  ciclopirox (LOPROX) 0.77 % SUSP, Apply to scalp 3 times weekly and leave in., Disp: 60 mL, Rfl: 2 .  clobetasol (TEMOVATE) 0.05 % external solution, APPLY TO SCALP ONCE DAILY AS NEEDED FOR ITCH. USE ON SCALP. AVOID FACE / GROIN / UNDERARM, Disp: 50 mL, Rfl: 1 .  CRANBERRY PO, Take 1 tablet by mouth daily., Disp: , Rfl:  .  ferrous sulfate 325 (65 FE) MG tablet, Take 1 tablet (325 mg total) by mouth 2 (two) times daily., Disp: 60 tablet, Rfl: 11 .  hydrocortisone 2.5 % cream, apply to affected area on Eyebrows twice daily for up to 2 week. Use on FACE, Disp: 28 g, Rfl: 3 .  Meclizine HCl (BONINE PO), Take by mouth as needed., Disp: , Rfl:  .  mirtazapine (REMERON) 30 MG tablet, TAKE  1 TABLET BY MOUTH EVERYDAY AT BEDTIME, Disp: 90 tablet, Rfl: 1 .  Multiple Vitamin (MULTIVITAMIN) tablet, Take 1 tablet by mouth daily., Disp: , Rfl:  .  NON FORMULARY, Walker with wheels - for use with ambulation once daily. 429.9, 715.90, 355.9, Disp: , Rfl:  .  psyllium (METAMUCIL) 58.6 % packet, Take 1 packet by mouth daily., Disp: , Rfl:  .  simvastatin (ZOCOR) 20 MG tablet, TAKE 1 TABLET BY MOUTH EVERY DAY, Disp: 90 tablet, Rfl: 1  Allergies  Allergen Reactions  . Ace Inhibitors     REACTION: cough  . Donepezil Diarrhea and Other (See Comments)    GI problems Other reaction(s): Other (See Comments) GI problems  . Sulfa Antibiotics Nausea Only    headache   Review of Systems Objective:  There were no vitals filed for this visit.  General: Well developed, nourished, in no acute distress, alert and oriented x3   Dermatological: Skin is warm, dry and supple  bilateral. Nails x 10 are well maintained; remaining integument appears unremarkable at this time. There are no open sores, no preulcerative lesions, no rash or signs of infection present.  Reactive hyperkeratotic lesion fifth digit of the right foot.  Vascular: Dorsalis Pedis artery and Posterior Tibial artery pedal pulses are 2/4 bilateral with immedate capillary fill time. Pedal hair growth present. No varicosities and no lower extremity edema present bilateral.   Neruologic: Grossly intact via light touch bilateral. Vibratory intact via tuning fork bilateral. Protective threshold with Semmes Wienstein monofilament intact to all pedal sites bilateral. Patellar and Achilles deep tendon reflexes 2+ bilateral. No Babinski or clonus noted bilateral.   Musculoskeletal: Moderate to severe hallux valgus deformity resulting in hammertoe deformities and lateral displacement of the toes.  They are rigid in nature secondary to osteoarthritic changes without skin breakdown.Marland Kitchen No pain, crepitus, or limitation noted with foot and ankle range of motion bilateral. Muscular strength 5/5 in all groups tested bilateral.  Gait: Unassisted, Nonantalgic.    Radiographs:  None taken  Assessment & Plan:   Assessment: Hammertoe deformities and severe hallux valgus deformities.  Plan: Discussed etiology pathology and surgical therapies at this point time I think the best thing for her would be to see about getting a diabetic type shoe soft side.  The problem with this is that she is nondiabetic and she does not have any findings that would allow for the shoe.  I recommended that she try to find a neoprene sided shoe to.  We will look in the [place] office to see if there are some there that may not have been picked up for 1 reason or the other.  We did place padding and I would like for them to come back 1 day and see about a toe tunnel pad which we do not have any of at this point.  I will follow-up with her on an as  needed basis.     Jaena Brocato [place], [place]

## 2020-10-13 ENCOUNTER — Ambulatory Visit: Payer: Medicare PPO | Admitting: Dermatology

## 2020-10-13 DIAGNOSIS — R3914 Feeling of incomplete bladder emptying: Secondary | ICD-10-CM | POA: Diagnosis not present

## 2020-10-13 DIAGNOSIS — R339 Retention of urine, unspecified: Secondary | ICD-10-CM | POA: Diagnosis not present

## 2020-10-13 DIAGNOSIS — N3941 Urge incontinence: Secondary | ICD-10-CM | POA: Diagnosis not present

## 2020-10-13 DIAGNOSIS — R32 Unspecified urinary incontinence: Secondary | ICD-10-CM | POA: Diagnosis not present

## 2020-11-14 DIAGNOSIS — N3941 Urge incontinence: Secondary | ICD-10-CM | POA: Diagnosis not present

## 2020-11-14 DIAGNOSIS — R32 Unspecified urinary incontinence: Secondary | ICD-10-CM | POA: Diagnosis not present

## 2020-11-14 DIAGNOSIS — R339 Retention of urine, unspecified: Secondary | ICD-10-CM | POA: Diagnosis not present

## 2020-11-14 DIAGNOSIS — R3914 Feeling of incomplete bladder emptying: Secondary | ICD-10-CM | POA: Diagnosis not present

## 2020-11-21 ENCOUNTER — Encounter: Payer: Self-pay | Admitting: Podiatry

## 2020-11-21 ENCOUNTER — Ambulatory Visit: Payer: Medicare PPO | Admitting: Podiatry

## 2020-11-21 ENCOUNTER — Other Ambulatory Visit: Payer: Self-pay

## 2020-11-21 DIAGNOSIS — M79674 Pain in right toe(s): Secondary | ICD-10-CM

## 2020-11-21 DIAGNOSIS — N189 Chronic kidney disease, unspecified: Secondary | ICD-10-CM

## 2020-11-21 DIAGNOSIS — L608 Other nail disorders: Secondary | ICD-10-CM

## 2020-11-21 DIAGNOSIS — B351 Tinea unguium: Secondary | ICD-10-CM | POA: Diagnosis not present

## 2020-11-21 DIAGNOSIS — E1149 Type 2 diabetes mellitus with other diabetic neurological complication: Secondary | ICD-10-CM

## 2020-11-21 DIAGNOSIS — M79675 Pain in left toe(s): Secondary | ICD-10-CM | POA: Diagnosis not present

## 2020-11-21 NOTE — Progress Notes (Signed)
This patient returns to my office for at risk foot care.  This patient requires this care by a professional since this patient will be at risk due to having chronic kidney disease and neuropathy.  Patient presents to the office with her daughter.  This patient is unable to cut nails herself since the patient cannot reach her nails.These nails are painful walking and wearing shoes.  This patient presents for at risk foot care today.  General Appearance  Alert, conversant and in no acute stress.  Vascular  Dorsalis pedis and posterior tibial  pulses are weakly  palpable  bilaterally.  Capillary return is within normal limits  bilaterally. Absent digital hair  bilaterally.  Neurologic  Senn-Weinstein monofilament wire test within normal limits  bilaterally. Muscle power within normal limits bilaterally.  Nails Thick disfigured discolored nails with subungual debris  from hallux to fifth toes bilaterally. Pincer nails  B/L.   No evidence of bacterial infection or drainage bilaterally.  Orthopedic  No limitations of motion  feet .  No crepitus or effusions noted.  No bony pathology or digital deformities noted.  HAV  B/L.  Skin  normotropic skin with no porokeratosis noted bilaterally.  No signs of infections or ulcers noted.     Onychomycosis  Pain in right toes  Pain in left toes  Consent was obtained for treatment procedures.   Mechanical debridement of nails 1-5  bilaterally performed with a nail nipper.  Filed with dremel without incident. No infection or ulcer.    Return office visit    10 weeks                  Told patient to return for periodic foot care and evaluation due to potential at risk complications.   Helane Gunther DPM

## 2020-11-24 ENCOUNTER — Other Ambulatory Visit: Payer: Self-pay | Admitting: Dermatology

## 2020-11-24 DIAGNOSIS — L219 Seborrheic dermatitis, unspecified: Secondary | ICD-10-CM

## 2020-11-25 ENCOUNTER — Other Ambulatory Visit: Payer: Self-pay | Admitting: Family Medicine

## 2020-12-13 DIAGNOSIS — R339 Retention of urine, unspecified: Secondary | ICD-10-CM | POA: Diagnosis not present

## 2021-01-02 ENCOUNTER — Ambulatory Visit: Payer: Medicare PPO | Admitting: Podiatry

## 2021-01-13 DIAGNOSIS — R339 Retention of urine, unspecified: Secondary | ICD-10-CM | POA: Diagnosis not present

## 2021-01-18 ENCOUNTER — Telehealth: Payer: Self-pay

## 2021-01-18 NOTE — Telephone Encounter (Signed)
[name] (DPR signed) left v/m that pt has persistent cough that pt cannot cough up phlegm and  very S/T since 01/13/21. Pt not eating solid food and [name] is not sure if due to throat hurting or pt just does not want to eat. Pt is drinking some but not a lot. [name] is not aware of abd pain, dizziness or dry mouth.  Pt has been taking Tussin DM but has not helped the cough. Pt has SOB after coughing episodes. Pt is up on 2 pillows but not helping cough. Pt is not ambulating with walker now. Pt said her legs are too weak. Family gets pt up in w/c. Pt is usually either diarrhea or constipated. Pt had loose stools on 01/14/21 - 01/16/21. No BM since 01/16/21.No vomiting, no runny nose or head congestion.does not have thermometer but pt has not felt warm. Pt has had 2 covid vaccines and 2 boosters for covid. [name] said to her knowledge pt has not been exposed to covid. [name] not sure if her son has any covid home test or not. [name] said that pt is not in any distress with breathing. Pt cannot do video visit and no available appts at [facility] today.[name] wants to know if can suggest a med that will not cause pt to be drowsy for S/T and cough.[name] did schedule virtual phone visit on 01/19/21 at 12 noon. Sending note to DR Milinda Antis and [employer] request cb. [facility].

## 2021-01-18 NOTE — Telephone Encounter (Signed)
Tylenol for the sore throat and encourage sips of fluids, thanks for the heads up I will talk to her tomorrow I am worried about covid with those symptoms - do they have access to a home test now?   Thanks

## 2021-01-18 NOTE — Telephone Encounter (Signed)
Daughter notified of Dr. Royden Purl recommendations and she will look into getting at home covid test

## 2021-01-19 ENCOUNTER — Encounter: Payer: Self-pay | Admitting: Family Medicine

## 2021-01-19 ENCOUNTER — Telehealth (INDEPENDENT_AMBULATORY_CARE_PROVIDER_SITE_OTHER): Payer: Medicare PPO | Admitting: Family Medicine

## 2021-01-19 DIAGNOSIS — U071 COVID-19: Secondary | ICD-10-CM | POA: Diagnosis not present

## 2021-01-19 MED ORDER — MOLNUPIRAVIR EUA 200MG CAPSULE: 4.0000 | ORAL_CAPSULE | Freq: Two times a day (BID) | ORAL | 0 refills | Status: AC

## 2021-01-19 NOTE — Patient Instructions (Signed)
Drink fluids and rest  Continue to isolate until symptoms are better (more than 5 days)  The tussin dm is ok for cough and congestion  You can take tylenol for sore throat or body aches or fever   I sent molnupiravir to your pharmacy. This is an anti viral medicine for covid 19 and should help symptoms.  Update if not starting to improve in a week or if worsening   If you develop worse sore throat or wheezing or shortness of breath please let me know.   If severe-go to the ER or call 911.

## 2021-01-19 NOTE — Progress Notes (Signed)
Virtual Visit via Telephone Note  I connected with Tracy Morales on 01/19/21 at 12:00 PM EDT by telephone and verified that I am speaking with the correct person using two identifiers.  Location: Patient: home Provider: office    I discussed the limitations, risks, security and privacy concerns of performing an evaluation and management service by telephone and the availability of in person appointments. I also discussed with the patient that there may be a patient responsible charge related to this service. The patient expressed understanding and agreed to proceed.  Parties involved in encounter  Patient: Tracy Morales Caregiver: [name]  Provider:  Roxy Manns MD   History of Present Illness: Pt presents with c/o of cough and sore throat  Took a covid test and it is positive   Symptoms started on 8/14 Persistent cough- cannot get up any phlegm Sore throat (does not want to eat either due to less appetite or st) No abd pain  No dizziness  No dry mouth- is drinking lots of fluids Felt warm, ? Fever   Today ST is better  Was able to eat some breakfast   Cough is off and on - about the same /a little production of clear mucus  No nausea  No diarrhea  No wheezing  Sometimes short of breath during the cough spell   No headache  Ears are fine  A little runny nose - mild    No known covid exposure   Some loose stool for several days then no bm   Fully covid immunized with 2 boosters   Otc  Tussin dm  Has not taken any tylenoo  Patient Active Problem List   Diagnosis Date Noted   COVID-19 01/19/2021   Pincer nail deformity 07/04/2020   Phlebitis 05/20/2020   Left leg pain 02/19/2020   Ankle sprain 01/15/2020   Seborrheic dermatitis of scalp 08/27/2019   Elevated random blood glucose level 05/11/2019   Pain due to onychomycosis of toenails of both feet 12/04/2018   Recurrent UTI 09/22/2018   Low back pain 09/15/2018   MCI (mild cognitive impairment)  05/11/2018   Frequent urination 02/28/2018   Mobility impaired 05/06/2017   Osteoarthritis of both hips 04/15/2015   Hip pain 04/14/2015   Encounter for general adult medical examination with abnormal findings 04/12/2015   Hair loss 11/19/2014   Loss of weight 06/09/2014   Arthritis of right shoulder region 06/09/2014   Nodule of left lung 06/09/2014   Encounter for Medicare annual wellness exam 10/30/2013   H/O fall 04/27/2013   Risk for falls 04/11/2012   Leg pain, bilateral 01/29/2012   MICROSCOPIC HEMATURIA 08/29/2009   CONDUCTIVE HEARING LOSS BILATERAL 08/08/2009   EDEMA 04/07/2009   BRADYCARDIA 10/26/2008   DYSPNEA 10/26/2008   INCONTINENCE, URGE 02/27/2008   Hyperlipidemia 10/11/2006   PERIPHERAL NEUROPATHY, LOWER EXTREMITIES, BILATERAL 10/11/2006   Essential hypertension 10/11/2006   DIASTOLIC DYSFUNCTION 10/11/2006   CAROTID STENOSIS 10/11/2006   CEREBROVASCULAR DISEASE 10/11/2006   PERIPHERAL VASCULAR DISEASE 10/11/2006   ALLERGIC RHINITIS 10/11/2006   Chronic Morales disease 10/11/2006   OVERACTIVE BLADDER 10/11/2006   OSTEOARTHRITIS 10/11/2006   CARDIAC MURMUR, HX OF 10/11/2006   Past Medical History:  Diagnosis Date   Allergic rhinitis, cause unspecified    Bradycardia    Cerebrovascular disease, unspecified    Chronic Morales disease, unspecified    chronic renal insufficiency   Dizziness and giddiness    DVT (deep venous thrombosis) (HCC)    R   Dyspnea  echo with normal EF. 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   HTN (hypertension)    Hyperlipidemia    Hypertonicity of bladder    hx   Mild depression (HCC)    Mononeuritis of lower limb, unspecified    peripheral neuropathy, bilateral   Occlusion and stenosis of carotid artery without mention of cerebral infarction 4/09   u/s L 40-59%, R 0-39%; stable for many years   Osteoarthrosis, unspecified whether generalized or localized, unspecified site    Peripheral  vascular disease, unspecified (HCC)    Polymyalgia rheumatica (HCC)    hx   RUQ pain    Urge incontinence    Past Surgical History:  Procedure Laterality Date   2D echo  8/06   mild aortic stenosis.    arterial doppler     carotid doppler  4/02   bilateral plaque    carotid doppler  8/06   non significant stenosis   carotid doppler     R 40%; L 40-60%   CATARACT EXTRACTION     CHOLECYSTECTOMY  2000   COLONOSCOPY  1/04   polyps   CT SCAN  05/25/08   abd and pelvis- inflated urinary bladder and large amt stool throughout w/o blockage    dexa  2/01   osteopenia   echo     mild LVH EF 50% As   triger finger contracture  6/04   vert. basilar insufficiency  2002   zoster  5/02   Social History   Tobacco Use   Smoking status: Never   Smokeless tobacco: Never   Tobacco comments:    non smoker   Vaping Use   Vaping Use: Never used  Substance Use Topics   Alcohol use: No    Alcohol/week: 0.0 standard drinks   Drug use: No   Family History  Problem Relation Age of Onset   Hypertension Father    Other Father        Cardiac problems   Heart disease Father    Other Brother        blood clot   Other Sister        benign breast nodule   Allergies  Allergen Reactions   Ace Inhibitors     REACTION: cough   Donepezil Diarrhea and Other (See Comments)    GI problems Other reaction(s): Other (See Comments) GI problems   Sulfa Antibiotics Nausea Only    headache   Current Outpatient Medications on File Prior to Visit  Medication Sig Dispense Refill   acetaminophen (TYLENOL) 500 MG tablet Take 500 mg by mouth every 6 (six) hours as needed. OTC-UAD     amLODipine (NORVASC) 5 MG tablet TAKE 1 TABLET BY MOUTH EVERY DAY 90 tablet 1   aspirin 325 MG EC tablet Take 325 mg by mouth daily.     Cholecalciferol (VITAMIN D3) 2000 units capsule Take 2,000 Units by mouth daily.     ciclopirox (LOPROX) 0.77 % SUSP APPLY TO SCALP 3 TIMES WEEKLY AND LEAVE IN AS DIRECTED. NOT COVERED  BY PLAN 60 mL 2   clobetasol (TEMOVATE) 0.05 % external solution APPLY TO SCALP ONCE DAILY AS NEEDED FOR ITCH. USE ON SCALP. AVOID FACE / GROIN / UNDERARM 50 mL 1   CRANBERRY PO Take 1 tablet by mouth daily.     ferrous sulfate 325 (65 FE) MG tablet Take 1 tablet (325 mg total) by mouth 2 (two) times daily. 60 tablet 11  hydrocortisone 2.5 % cream apply to affected area on Eyebrows twice daily for up to 2 week. Use on FACE 28 g 3   ketoconazole (NIZORAL) 2 % shampoo MESSAGE INTO SCALP 3 TIMES A WEEK, LEAVE ON FOR 10 MINUTES 120 mL 2   Meclizine HCl (BONINE PO) Take by mouth as needed.     mirtazapine (REMERON) 30 MG tablet TAKE 1 TABLET BY MOUTH EVERYDAY AT BEDTIME 90 tablet 1   NON FORMULARY Walker with wheels - for use with ambulation once daily. 429.9, 715.90, 355.9     psyllium (METAMUCIL) 58.6 % packet Take 1 packet by mouth daily as needed.     simvastatin (ZOCOR) 20 MG tablet TAKE 1 TABLET BY MOUTH EVERY DAY 90 tablet 1   No current facility-administered medications on file prior to visit.   Review of Systems  Constitutional:  Positive for malaise/fatigue. Negative for chills and fever.  HENT:  Positive for sore throat. Negative for congestion, ear pain and sinus pain.        Rhinorrhea  Eyes:  Negative for blurred vision, discharge and redness.  Respiratory:  Positive for cough and sputum production. Negative for shortness of breath, wheezing and stridor.   Cardiovascular:  Negative for chest pain, palpitations and leg swelling.  Gastrointestinal:  Negative for abdominal pain, diarrhea, nausea and vomiting.  Musculoskeletal:  Negative for myalgias.  Skin:  Negative for rash.  Neurological:  Negative for dizziness and headaches.     Observations/Objective: Pt sounds hoarse and mildly congested  Pleasant, good mood  Cognitively intact-able to provide full history and voiced understanding of instructions  Occ wet sounding cough No sob or wheezing audible during conversation     Assessment and Plan: Problem List Items Addressed This Visit       Other   COVID-19    With cough and ST (improved ST) Drinking fluids Fully immunized with 2 boosters  inst to rest and hydrate and continue to isolate  Px molnupiravir and disc poss side effects  Plans to continue tussin DM prn  Tylenol prn for pain or fever or ST Update if not starting to improve in a week or if worsening   ER precautions discussed      Relevant Medications   molnupiravir EUA 200 mg CAPS     Follow Up Instructions: Drink fluids and rest  Continue to isolate until symptoms are better (more than 5 days)  The tussin dm is ok for cough and congestion  You can take tylenol for sore throat or body aches or fever   I sent molnupiravir to your pharmacy. This is an anti viral medicine for covid 19 and should help symptoms.  Update if not starting to improve in a week or if worsening   If you develop worse sore throat or wheezing or shortness of breath please let me know.   If severe-go to the ER or call 911.    I discussed the assessment and treatment plan with the patient. The patient was provided an opportunity to ask questions and all were answered. The patient agreed with the plan and demonstrated an understanding of the instructions.   The patient was advised to call back or seek an in-person evaluation if the symptoms worsen or if the condition fails to improve as anticipated.  I provided 18 minutes of non-face-to-face time during this encounter.   Roxy Manns, MD

## 2021-01-19 NOTE — Assessment & Plan Note (Addendum)
With cough and ST (improved ST) Drinking fluids Fully immunized with 2 boosters  inst to rest and hydrate and continue to isolate  Px molnupiravir and disc poss side effects  Plans to continue tussin DM prn  Tylenol prn for pain or fever or ST Update if not starting to improve in a week or if worsening   ER precautions discussed

## 2021-01-30 ENCOUNTER — Telehealth: Payer: Self-pay

## 2021-01-30 MED ORDER — BENZONATATE 200 MG PO CAPS: 200.0000 mg | ORAL_CAPSULE | Freq: Three times a day (TID) | ORAL | 1 refills | Status: DC | PRN

## 2021-01-30 NOTE — Telephone Encounter (Signed)
Spoke with daughter and advised of Dr. Royden Purl comments. Rx sent to pharmacy and ER precautions given

## 2021-01-30 NOTE — Telephone Encounter (Signed)
I pended tessalon If worse or any sob-give ER precaution   I hope that helps  Low threshold to get cxr if it worsens in light of age

## 2021-01-30 NOTE — Telephone Encounter (Signed)
Patient daughter called states that she is doing better from Covid but still has cough. Cough is not productive. Cough is not as bad as when she had covid. She has been taking Tussin DM. She has not tried anything further. She is pushing water. Wanted to know if anything further that can be done to help with cough. Denies any wheezing, sob or fever.

## 2021-02-02 ENCOUNTER — Encounter: Payer: Self-pay | Admitting: Podiatry

## 2021-02-02 ENCOUNTER — Other Ambulatory Visit: Payer: Self-pay

## 2021-02-02 ENCOUNTER — Ambulatory Visit: Payer: Medicare PPO | Admitting: Podiatry

## 2021-02-02 DIAGNOSIS — N189 Chronic kidney disease, unspecified: Secondary | ICD-10-CM

## 2021-02-02 DIAGNOSIS — B351 Tinea unguium: Secondary | ICD-10-CM | POA: Diagnosis not present

## 2021-02-02 DIAGNOSIS — E1149 Type 2 diabetes mellitus with other diabetic neurological complication: Secondary | ICD-10-CM

## 2021-02-02 DIAGNOSIS — L608 Other nail disorders: Secondary | ICD-10-CM

## 2021-02-02 DIAGNOSIS — M79675 Pain in left toe(s): Secondary | ICD-10-CM

## 2021-02-02 DIAGNOSIS — M79674 Pain in right toe(s): Secondary | ICD-10-CM | POA: Diagnosis not present

## 2021-02-02 NOTE — Progress Notes (Signed)
This patient returns to my office for at risk foot care.  This patient requires this care by a professional since this patient will be at risk due to having chronic kidney disease and neuropathy.  Patient presents to the office with her daughter.  This patient is unable to cut nails herself since the patient cannot reach her nails.These nails are painful walking and wearing shoes.  This patient presents for at risk foot care today.  General Appearance  Alert, conversant and in no acute stress.  Vascular  Dorsalis pedis and posterior tibial  pulses are weakly  palpable  bilaterally.  Capillary return is within normal limits  bilaterally. Absent digital hair  bilaterally.  Neurologic  Senn-Weinstein monofilament wire test diminished bilaterally. Muscle power within normal limits bilaterally.  Nails Thick disfigured discolored nails with subungual debris  from hallux to fifth toes bilaterally. Pincer nails  B/L.   No evidence of bacterial infection or drainage bilaterally.  Orthopedic  No limitations of motion  feet .  No crepitus or effusions noted.  No bony pathology or digital deformities noted.  HAV  B/L.  Skin  normotropic skin with no porokeratosis noted bilaterally.  No signs of infections or ulcers noted.     Onychomycosis  Pain in right toes  Pain in left toes  Consent was obtained for treatment procedures.   Mechanical debridement of nails 1-5  bilaterally performed with a nail nipper.  Filed with dremel without incident. No infection or ulcer.    Return office visit    10 weeks                  Told patient to return for periodic foot care and evaluation due to potential at risk complications.   Helane Gunther DPM

## 2021-02-09 ENCOUNTER — Ambulatory Visit: Payer: Medicare PPO | Admitting: Dermatology

## 2021-02-09 DIAGNOSIS — R339 Retention of urine, unspecified: Secondary | ICD-10-CM | POA: Diagnosis not present

## 2021-02-17 ENCOUNTER — Other Ambulatory Visit: Payer: Self-pay | Admitting: Family Medicine

## 2021-02-17 ENCOUNTER — Ambulatory Visit (INDEPENDENT_AMBULATORY_CARE_PROVIDER_SITE_OTHER): Payer: Medicare PPO

## 2021-02-17 ENCOUNTER — Other Ambulatory Visit: Payer: Self-pay | Admitting: Dermatology

## 2021-02-17 ENCOUNTER — Other Ambulatory Visit: Payer: Self-pay

## 2021-02-17 DIAGNOSIS — L219 Seborrheic dermatitis, unspecified: Secondary | ICD-10-CM

## 2021-02-17 DIAGNOSIS — Z23 Encounter for immunization: Secondary | ICD-10-CM

## 2021-02-17 NOTE — Telephone Encounter (Signed)
Cpe scheduled on 05/22/21 last filled on 09/19/20 #90 tabs with 1 refill

## 2021-02-21 ENCOUNTER — Encounter: Payer: Self-pay | Admitting: Dermatology

## 2021-02-21 ENCOUNTER — Ambulatory Visit: Payer: Medicare PPO | Admitting: Dermatology

## 2021-02-21 ENCOUNTER — Other Ambulatory Visit: Payer: Self-pay

## 2021-02-21 DIAGNOSIS — L219 Seborrheic dermatitis, unspecified: Secondary | ICD-10-CM | POA: Diagnosis not present

## 2021-02-21 MED ORDER — FLUOCINOLONE ACETONIDE BODY 0.01 % EX OIL: 1.0000 "application " | TOPICAL_OIL | Freq: Every day | CUTANEOUS | 5 refills | Status: DC

## 2021-02-21 NOTE — Patient Instructions (Addendum)
Start over the counter Neutrogena T-Sal once weekly. Massage into scalp and let sit for 10 minutes before rinsing.  Increase clobetasol 0.05% solution to once daily as needed for itch and scale. If able to get fluocinolone oil, use this and NOT clobetasol solution once daily. Continue ciclopirox solution three times a week and leave in.  Continue ketoconazole 2% shampoo once weekly. Massage into scalp and let sit for 10 minutes before rinsing.   If you have any questions or concerns for your doctor, please call our main line at [phone] and press option 4 to reach your doctor's medical assistant. If no one answers, please leave a voicemail as directed and we will return your call as soon as possible. Messages left after 4 pm will be answered the following business day.   You may also send [place] a message via MyChart. We typically respond to MyChart messages within 1-2 business days.  For prescription refills, please ask your pharmacy to contact our office. Our fax number is [phone].  If you have an urgent issue when the clinic is closed that cannot wait until the next business day, you can page your doctor at the number below.    Please note that while we do our best to be available for urgent issues outside of office hours, we are not available 24/7.   If you have an urgent issue and are unable to reach [place], you may choose to seek medical care at your doctor's office, retail clinic, urgent care center, or emergency room.  If you have a medical emergency, please immediately call 911 or go to the emergency department.  Pager Numbers  - Dr. Gwen Pounds: [phone]  - Dr. Neale Burly: [phone]  - Dr. Roseanne Reno: [phone]  In the event of inclement weather, please call our main line at [phone] for an update on the status of any delays or closures.  Dermatology Medication Tips: Please keep the boxes that topical medications come in in order to help keep track of the instructions about  where and how to use these. Pharmacies typically print the medication instructions only on the boxes and not directly on the medication tubes.   If your medication is too expensive, please contact our office at [phone] option 4 or send [place] a message through MyChart.   We are unable to tell what your co-pay for medications will be in advance as this is different depending on your insurance coverage. However, we may be able to find a substitute medication at lower cost or fill out paperwork to get insurance to cover a needed medication.   If a prior authorization is required to get your medication covered by your insurance company, please allow [place] 1-2 business days to complete this process.  Drug prices often vary depending on where the prescription is filled and some pharmacies may offer cheaper prices.  The website www.goodrx.com contains coupons for medications through different pharmacies. The prices here do not account for what the cost may be with help from insurance (it may be cheaper with your insurance), but the website can give you the price if you did not use any insurance.  - You can print the associated coupon and take it with your prescription to the pharmacy.  - You may also stop by our office during regular business hours and pick up a GoodRx coupon card.  - If you need your prescription sent electronically to a different pharmacy, notify our office through [facility] or by phone at [phone] option  4.  

## 2021-02-21 NOTE — Progress Notes (Signed)
   Follow-Up Visit   Subjective  Tracy Morales is a 87 y.o. female who presents for the following: Follow-up (Patient here today for seb derm follow up. She is using ciclopirox solution, ketoconazole 2% shampoo once weekly, clobetasol solution daily as needed for itch and HC to areas at eyebrows. Patient advises the itching and flakes are terrible. ).    The following portions of the chart were reviewed this encounter and updated as appropriate:   Tobacco  Allergies  Meds  Problems  Med Hx  Surg Hx  Fam Hx      Review of Systems:  No other skin or systemic complaints except as noted in HPI or Assessment and Plan.  Objective  Well appearing patient in no apparent distress; mood and affect are within normal limits.  A focused examination was performed including scalp, face. Relevant physical exam findings are noted in the Assessment and Plan.  Scalp Scaly patches at scalp   Assessment & Plan  Seborrheic dermatitis Scalp  Chronic condition with duration or expected duration over one year. Condition is bothersome to patient. Not currently at goal.  Seborrheic Dermatitis  -  is a chronic persistent rash characterized by pinkness and scaling most commonly of the mid face but also can occur on the scalp (dandruff), ears; mid chest, mid back and groin.  It tends to be exacerbated by stress and cooler weather.  People who have neurologic disease may experience new onset or exacerbation of existing seborrheic dermatitis.  The condition is not curable but treatable and can be controlled.  Start Neutrogena T-Sal once weekly. Massage into scalp and let sit for 10 minutes before rinsing.  Increase clobetasol 0.05% solution to once daily as needed for itch and scale. If able to get fluocinolone oil, use this and NOT clobetasol solution once daily. Continue ciclopirox solution increasing to three times a week and leave in.  Can continue or discontinue ketoconazole 2% shampoo once weekly.  Massage into scalp and let sit for 10 minutes before rinsing.   Fluocinolone Acetonide Body 0.01 % OIL - Scalp Apply 1 application topically daily.  Related Medications hydrocortisone 2.5 % cream apply to affected area on Eyebrows twice daily for up to 2 week. Use on FACE  ketoconazole (NIZORAL) 2 % shampoo MESSAGE INTO SCALP 3 TIMES A WEEK, LEAVE ON FOR 10 MINUTES  ciclopirox (LOPROX) 0.77 % SUSP APPLY TO SCALP 3 TIMES WEEKLY AND LEAVE IN AS DIRECTED. NOT COVERED BY PLAN  clobetasol (TEMOVATE) 0.05 % external solution APPLY TO SCALP ONCE DAILY AS NEEDED FOR ITCH. USE ON SCALP. AVOID FACE / GROIN / UNDERARM  Return in about 4 months (around 06/23/2021) for seb derm.  Anise Salvo, RMA, am acting as scribe for Darden Dates, MD .  Documentation: I have reviewed the above documentation for accuracy and completeness, and I agree with the above.  Darden Dates, MD

## 2021-02-22 ENCOUNTER — Other Ambulatory Visit: Payer: Self-pay | Admitting: Dermatology

## 2021-02-22 DIAGNOSIS — L219 Seborrheic dermatitis, unspecified: Secondary | ICD-10-CM

## 2021-03-15 DIAGNOSIS — R32 Unspecified urinary incontinence: Secondary | ICD-10-CM | POA: Diagnosis not present

## 2021-03-15 DIAGNOSIS — R3914 Feeling of incomplete bladder emptying: Secondary | ICD-10-CM | POA: Diagnosis not present

## 2021-03-15 DIAGNOSIS — R339 Retention of urine, unspecified: Secondary | ICD-10-CM | POA: Diagnosis not present

## 2021-03-15 DIAGNOSIS — N3941 Urge incontinence: Secondary | ICD-10-CM | POA: Diagnosis not present

## 2021-03-18 ENCOUNTER — Emergency Department: Payer: Medicare PPO

## 2021-03-18 ENCOUNTER — Inpatient Hospital Stay: Payer: Medicare PPO | Admitting: Anesthesiology

## 2021-03-18 ENCOUNTER — Encounter
Admission: EM | Disposition: A | Discharge status: Skilled Nursing Facility | Payer: Self-pay | Source: Skilled Nursing Facility | Attending: Family Medicine

## 2021-03-18 ENCOUNTER — Inpatient Hospital Stay: Payer: Medicare PPO

## 2021-03-18 ENCOUNTER — Inpatient Hospital Stay
Admission: EM | Admit: 2021-03-18 | Discharge: 2021-03-23 | DRG: 481 | Disposition: A | Discharge status: Skilled Nursing Facility | Payer: Medicare PPO | Source: Skilled Nursing Facility | Attending: Internal Medicine | Admitting: Internal Medicine

## 2021-03-18 ENCOUNTER — Other Ambulatory Visit: Payer: Self-pay

## 2021-03-18 DIAGNOSIS — Z8781 Personal history of (healed) traumatic fracture: Secondary | ICD-10-CM | POA: Insufficient documentation

## 2021-03-18 DIAGNOSIS — W19XXXA Unspecified fall, initial encounter: Secondary | ICD-10-CM | POA: Insufficient documentation

## 2021-03-18 DIAGNOSIS — S0990XA Unspecified injury of head, initial encounter: Secondary | ICD-10-CM | POA: Diagnosis not present

## 2021-03-18 DIAGNOSIS — S7221XA Displaced subtrochanteric fracture of right femur, initial encounter for closed fracture: Principal | ICD-10-CM | POA: Diagnosis present

## 2021-03-18 DIAGNOSIS — E785 Hyperlipidemia, unspecified: Secondary | ICD-10-CM | POA: Diagnosis not present

## 2021-03-18 DIAGNOSIS — Z8673 Personal history of transient ischemic attack (TIA), and cerebral infarction without residual deficits: Secondary | ICD-10-CM

## 2021-03-18 DIAGNOSIS — J984 Other disorders of lung: Secondary | ICD-10-CM | POA: Diagnosis not present

## 2021-03-18 DIAGNOSIS — S72321A Displaced transverse fracture of shaft of right femur, initial encounter for closed fracture: Secondary | ICD-10-CM | POA: Insufficient documentation

## 2021-03-18 DIAGNOSIS — R001 Bradycardia, unspecified: Secondary | ICD-10-CM | POA: Diagnosis not present

## 2021-03-18 DIAGNOSIS — F32A Depression, unspecified: Secondary | ICD-10-CM | POA: Diagnosis not present

## 2021-03-18 DIAGNOSIS — Z882 Allergy status to sulfonamides status: Secondary | ICD-10-CM | POA: Diagnosis not present

## 2021-03-18 DIAGNOSIS — Z8249 Family history of ischemic heart disease and other diseases of the circulatory system: Secondary | ICD-10-CM

## 2021-03-18 DIAGNOSIS — Z66 Do not resuscitate: Secondary | ICD-10-CM | POA: Diagnosis present

## 2021-03-18 DIAGNOSIS — Z20822 Contact with and (suspected) exposure to covid-19: Secondary | ICD-10-CM | POA: Diagnosis not present

## 2021-03-18 DIAGNOSIS — Z888 Allergy status to other drugs, medicaments and biological substances status: Secondary | ICD-10-CM

## 2021-03-18 DIAGNOSIS — M6281 Muscle weakness (generalized): Secondary | ICD-10-CM | POA: Diagnosis not present

## 2021-03-18 DIAGNOSIS — E440 Moderate protein-calorie malnutrition: Secondary | ICD-10-CM | POA: Diagnosis not present

## 2021-03-18 DIAGNOSIS — Z7982 Long term (current) use of aspirin: Secondary | ICD-10-CM

## 2021-03-18 DIAGNOSIS — W1830XA Fall on same level, unspecified, initial encounter: Secondary | ICD-10-CM | POA: Diagnosis present

## 2021-03-18 DIAGNOSIS — I129 Hypertensive chronic kidney disease with stage 1 through stage 4 chronic kidney disease, or unspecified chronic kidney disease: Secondary | ICD-10-CM | POA: Diagnosis not present

## 2021-03-18 DIAGNOSIS — S72009A Fracture of unspecified part of neck of unspecified femur, initial encounter for closed fracture: Secondary | ICD-10-CM | POA: Diagnosis present

## 2021-03-18 DIAGNOSIS — M7989 Other specified soft tissue disorders: Secondary | ICD-10-CM | POA: Diagnosis not present

## 2021-03-18 DIAGNOSIS — I10 Essential (primary) hypertension: Secondary | ICD-10-CM | POA: Diagnosis not present

## 2021-03-18 DIAGNOSIS — S7291XA Unspecified fracture of right femur, initial encounter for closed fracture: Secondary | ICD-10-CM | POA: Diagnosis not present

## 2021-03-18 DIAGNOSIS — Z6821 Body mass index (BMI) 21.0-21.9, adult: Secondary | ICD-10-CM

## 2021-03-18 DIAGNOSIS — N1832 Chronic kidney disease, stage 3b: Secondary | ICD-10-CM | POA: Diagnosis not present

## 2021-03-18 DIAGNOSIS — D62 Acute posthemorrhagic anemia: Secondary | ICD-10-CM | POA: Diagnosis present

## 2021-03-18 DIAGNOSIS — Z79899 Other long term (current) drug therapy: Secondary | ICD-10-CM

## 2021-03-18 DIAGNOSIS — Z01818 Encounter for other preprocedural examination: Secondary | ICD-10-CM | POA: Diagnosis not present

## 2021-03-18 DIAGNOSIS — Z043 Encounter for examination and observation following other accident: Secondary | ICD-10-CM | POA: Diagnosis not present

## 2021-03-18 DIAGNOSIS — R609 Edema, unspecified: Secondary | ICD-10-CM | POA: Diagnosis not present

## 2021-03-18 DIAGNOSIS — M4319 Spondylolisthesis, multiple sites in spine: Secondary | ICD-10-CM | POA: Diagnosis not present

## 2021-03-18 DIAGNOSIS — R140 Abdominal distension (gaseous): Secondary | ICD-10-CM

## 2021-03-18 DIAGNOSIS — N183 Chronic kidney disease, stage 3 unspecified: Secondary | ICD-10-CM

## 2021-03-18 DIAGNOSIS — Y92009 Unspecified place in unspecified non-institutional (private) residence as the place of occurrence of the external cause: Secondary | ICD-10-CM | POA: Diagnosis not present

## 2021-03-18 DIAGNOSIS — I959 Hypotension, unspecified: Secondary | ICD-10-CM | POA: Diagnosis not present

## 2021-03-18 DIAGNOSIS — S72301A Unspecified fracture of shaft of right femur, initial encounter for closed fracture: Secondary | ICD-10-CM | POA: Diagnosis not present

## 2021-03-18 DIAGNOSIS — Z7401 Bed confinement status: Secondary | ICD-10-CM | POA: Diagnosis not present

## 2021-03-18 DIAGNOSIS — I739 Peripheral vascular disease, unspecified: Secondary | ICD-10-CM | POA: Diagnosis not present

## 2021-03-18 DIAGNOSIS — K5900 Constipation, unspecified: Secondary | ICD-10-CM | POA: Diagnosis not present

## 2021-03-18 DIAGNOSIS — Z419 Encounter for procedure for purposes other than remedying health state, unspecified: Secondary | ICD-10-CM

## 2021-03-18 DIAGNOSIS — E7800 Pure hypercholesterolemia, unspecified: Secondary | ICD-10-CM | POA: Diagnosis not present

## 2021-03-18 DIAGNOSIS — I517 Cardiomegaly: Secondary | ICD-10-CM | POA: Diagnosis not present

## 2021-03-18 DIAGNOSIS — Z9181 History of falling: Secondary | ICD-10-CM | POA: Diagnosis not present

## 2021-03-18 DIAGNOSIS — M47812 Spondylosis without myelopathy or radiculopathy, cervical region: Secondary | ICD-10-CM | POA: Diagnosis not present

## 2021-03-18 DIAGNOSIS — S72321D Displaced transverse fracture of shaft of right femur, subsequent encounter for closed fracture with routine healing: Secondary | ICD-10-CM | POA: Diagnosis not present

## 2021-03-18 DIAGNOSIS — R2681 Unsteadiness on feet: Secondary | ICD-10-CM | POA: Diagnosis not present

## 2021-03-18 DIAGNOSIS — R531 Weakness: Secondary | ICD-10-CM | POA: Diagnosis not present

## 2021-03-18 DIAGNOSIS — N184 Chronic kidney disease, stage 4 (severe): Secondary | ICD-10-CM

## 2021-03-18 DIAGNOSIS — R2689 Other abnormalities of gait and mobility: Secondary | ICD-10-CM | POA: Diagnosis not present

## 2021-03-18 HISTORY — PX: FEMUR IM NAIL: SHX1597

## 2021-03-18 LAB — TYPE AND SCREEN
ABO/RH(D): O POS
Antibody Screen: NEGATIVE

## 2021-03-18 LAB — BASIC METABOLIC PANEL
Anion gap: 11 (ref 5–15)
BUN: 30 mg/dL — ABNORMAL HIGH (ref 8–23)
CO2: 24 mmol/L (ref 22–32)
Calcium: 8.8 mg/dL — ABNORMAL LOW (ref 8.9–10.3)
Chloride: 102 mmol/L (ref 98–111)
Creatinine, Ser: 1.49 mg/dL — ABNORMAL HIGH (ref 0.44–1.00)
GFR, Estimated: 31 mL/min — ABNORMAL LOW (ref >60)
Glucose, Bld: 130 mg/dL — ABNORMAL HIGH (ref 70–99)
Potassium: 4.1 mmol/L (ref 3.5–5.1)
Sodium: 137 mmol/L (ref 135–145)

## 2021-03-18 LAB — CBC WITH DIFFERENTIAL/PLATELET
Abs Immature Granulocytes: 0.02 10*3/uL (ref 0.00–0.07)
Basophils Absolute: 0.1 10*3/uL (ref 0.0–0.1)
Basophils Relative: 1 %
Eosinophils Absolute: 0 10*3/uL (ref 0.0–0.5)
Eosinophils Relative: 0 %
HCT: 37 % (ref 36.0–46.0)
Hemoglobin: 12.1 g/dL (ref 12.0–15.0)
Immature Granulocytes: 0 %
Lymphocytes Relative: 15 %
Lymphs Abs: 1 10*3/uL (ref 0.7–4.0)
MCH: 28.9 pg (ref 26.0–34.0)
MCHC: 32.7 g/dL (ref 30.0–36.0)
MCV: 88.3 fL (ref 80.0–100.0)
Monocytes Absolute: 0.5 10*3/uL (ref 0.1–1.0)
Monocytes Relative: 8 %
Neutro Abs: 5.3 10*3/uL (ref 1.7–7.7)
Neutrophils Relative %: 76 %
Platelets: 242 10*3/uL (ref 150–400)
RBC: 4.19 MIL/uL (ref 3.87–5.11)
RDW: 15.3 % (ref 11.5–15.5)
WBC: 6.8 10*3/uL (ref 4.0–10.5)
nRBC: 0 % (ref 0.0–0.2)

## 2021-03-18 LAB — RESP PANEL BY RT-PCR (FLU A&B, COVID) ARPGX2
Influenza A by PCR: NEGATIVE
Influenza B by PCR: NEGATIVE
SARS Coronavirus 2 by RT PCR: NEGATIVE

## 2021-03-18 LAB — PROTIME-INR
INR: 1 (ref 0.8–1.2)
Prothrombin Time: 13.5 seconds (ref 11.4–15.2)

## 2021-03-18 LAB — APTT: aPTT: 31 seconds (ref 24–36)

## 2021-03-18 SURGERY — INSERTION, INTRAMEDULLARY ROD, FEMUR
Anesthesia: General | Laterality: Right

## 2021-03-18 MED ORDER — ONDANSETRON HCL 4 MG/2ML IJ SOLN: 4.0000 mg | Freq: Four times a day (QID) | INTRAMUSCULAR | Status: DC | PRN

## 2021-03-18 MED ORDER — PHENYLEPHRINE HCL (PRESSORS) 10 MG/ML IV SOLN
INTRAVENOUS | Status: AC
  Filled 2021-03-18: qty 1

## 2021-03-18 MED ORDER — BUPIVACAINE-EPINEPHRINE 0.5% -1:200000 IJ SOLN
INTRAMUSCULAR | Status: DC | PRN
  Administered 2021-03-18: 30 mL

## 2021-03-18 MED ORDER — CEFAZOLIN SODIUM-DEXTROSE 2-4 GM/100ML-% IV SOLN
2.0000 g | Freq: Four times a day (QID) | INTRAVENOUS | Status: AC
  Administered 2021-03-18 – 2021-03-19 (×3): 2 g via INTRAVENOUS
  Filled 2021-03-18 (×3): qty 100

## 2021-03-18 MED ORDER — CEFAZOLIN SODIUM-DEXTROSE 2-4 GM/100ML-% IV SOLN: 2.0000 g | Freq: Three times a day (TID) | INTRAVENOUS | Status: DC

## 2021-03-18 MED ORDER — SUCCINYLCHOLINE CHLORIDE 200 MG/10ML IV SOSY
PREFILLED_SYRINGE | INTRAVENOUS | Status: DC | PRN
  Administered 2021-03-18: 100 mg via INTRAVENOUS

## 2021-03-18 MED ORDER — ONDANSETRON HCL 4 MG/2ML IJ SOLN
4.0000 mg | Freq: Once | INTRAMUSCULAR | Status: AC
  Administered 2021-03-18: 4 mg via INTRAVENOUS
  Filled 2021-03-18: qty 2

## 2021-03-18 MED ORDER — CEFAZOLIN SODIUM-DEXTROSE 2-4 GM/100ML-% IV SOLN: 2.0000 g | Freq: Four times a day (QID) | INTRAVENOUS | Status: DC

## 2021-03-18 MED ORDER — BISACODYL 10 MG RE SUPP
10.0000 mg | Freq: Every day | RECTAL | Status: DC | PRN
  Administered 2021-03-20: 10 mg via RECTAL

## 2021-03-18 MED ORDER — CEFAZOLIN SODIUM-DEXTROSE 2-4 GM/100ML-% IV SOLN
INTRAVENOUS | Status: AC
  Filled 2021-03-18: qty 100

## 2021-03-18 MED ORDER — MORPHINE SULFATE (PF) 2 MG/ML IV SOLN: 1.0000 mg | INTRAVENOUS | Status: DC | PRN

## 2021-03-18 MED ORDER — HYDROCODONE-ACETAMINOPHEN 5-325 MG PO TABS
1.0000 | ORAL_TABLET | Freq: Four times a day (QID) | ORAL | Status: DC | PRN
  Administered 2021-03-19 – 2021-03-20 (×2): 2 via ORAL
  Administered 2021-03-21 – 2021-03-22 (×3): 1 via ORAL
  Administered 2021-03-23: 2 via ORAL
  Filled 2021-03-18 (×3): qty 2
  Filled 2021-03-18 (×3): qty 1

## 2021-03-18 MED ORDER — MIRTAZAPINE 15 MG PO TABS
30.0000 mg | ORAL_TABLET | Freq: Every day | ORAL | Status: DC
  Administered 2021-03-18 – 2021-03-22 (×5): 30 mg via ORAL
  Filled 2021-03-18 (×5): qty 2

## 2021-03-18 MED ORDER — FENTANYL CITRATE (PF) 100 MCG/2ML IJ SOLN
INTRAMUSCULAR | Status: DC | PRN
  Administered 2021-03-18 (×4): 25 ug via INTRAVENOUS

## 2021-03-18 MED ORDER — FENTANYL CITRATE (PF) 100 MCG/2ML IJ SOLN: 25.0000 ug | INTRAMUSCULAR | Status: DC | PRN

## 2021-03-18 MED ORDER — BUPIVACAINE HCL (PF) 0.5 % IJ SOLN
INTRAMUSCULAR | Status: AC
  Filled 2021-03-18: qty 30

## 2021-03-18 MED ORDER — METOCLOPRAMIDE HCL 10 MG PO TABS: 5.0000 mg | ORAL_TABLET | Freq: Three times a day (TID) | ORAL | Status: DC | PRN

## 2021-03-18 MED ORDER — DOCUSATE SODIUM 100 MG PO CAPS
100.0000 mg | ORAL_CAPSULE | Freq: Two times a day (BID) | ORAL | Status: DC
  Administered 2021-03-18 – 2021-03-23 (×10): 100 mg via ORAL
  Filled 2021-03-18 (×10): qty 1

## 2021-03-18 MED ORDER — METHOCARBAMOL 1000 MG/10ML IJ SOLN
500.0000 mg | Freq: Four times a day (QID) | INTRAVENOUS | Status: DC | PRN
  Filled 2021-03-18: qty 5

## 2021-03-18 MED ORDER — DIPHENHYDRAMINE HCL 12.5 MG/5ML PO ELIX: 12.5000 mg | ORAL_SOLUTION | ORAL | Status: DC | PRN

## 2021-03-18 MED ORDER — BUPIVACAINE-EPINEPHRINE (PF) 0.5% -1:200000 IJ SOLN
INTRAMUSCULAR | Status: AC
  Filled 2021-03-18: qty 30

## 2021-03-18 MED ORDER — LIDOCAINE HCL (CARDIAC) PF 100 MG/5ML IV SOSY
PREFILLED_SYRINGE | INTRAVENOUS | Status: DC | PRN
  Administered 2021-03-18: 50 mg via INTRAVENOUS

## 2021-03-18 MED ORDER — PHENYLEPHRINE HCL (PRESSORS) 10 MG/ML IV SOLN
INTRAVENOUS | Status: DC | PRN
  Administered 2021-03-18 (×3): 100 ug via INTRAVENOUS
  Administered 2021-03-18: 200 ug via INTRAVENOUS
  Administered 2021-03-18: 100 ug via INTRAVENOUS
  Administered 2021-03-18: 200 ug via INTRAVENOUS
  Administered 2021-03-18 (×2): 100 ug via INTRAVENOUS

## 2021-03-18 MED ORDER — FENTANYL CITRATE PF 50 MCG/ML IJ SOSY
25.0000 ug | PREFILLED_SYRINGE | Freq: Once | INTRAMUSCULAR | Status: AC
  Administered 2021-03-18: 25 ug via INTRAVENOUS
  Filled 2021-03-18: qty 1

## 2021-03-18 MED ORDER — MORPHINE SULFATE (PF) 2 MG/ML IV SOLN
2.0000 mg | INTRAVENOUS | Status: DC | PRN
  Administered 2021-03-18: 2 mg via INTRAVENOUS
  Filled 2021-03-18: qty 1

## 2021-03-18 MED ORDER — FLEET ENEMA 7-19 GM/118ML RE ENEM: 1.0000 | ENEMA | Freq: Once | RECTAL | Status: DC | PRN

## 2021-03-18 MED ORDER — PROPOFOL 10 MG/ML IV BOLUS
INTRAVENOUS | Status: DC | PRN
  Administered 2021-03-18: 70 mg via INTRAVENOUS

## 2021-03-18 MED ORDER — ACETAMINOPHEN 325 MG PO TABS: 325.0000 mg | ORAL_TABLET | Freq: Four times a day (QID) | ORAL | Status: DC | PRN

## 2021-03-18 MED ORDER — ONDANSETRON HCL 4 MG PO TABS: 4.0000 mg | ORAL_TABLET | Freq: Four times a day (QID) | ORAL | Status: DC | PRN

## 2021-03-18 MED ORDER — LACTATED RINGERS IV SOLN: INTRAVENOUS | Status: DC | PRN

## 2021-03-18 MED ORDER — ACETAMINOPHEN 325 MG PO TABS: 650.0000 mg | ORAL_TABLET | Freq: Four times a day (QID) | ORAL | Status: DC | PRN

## 2021-03-18 MED ORDER — ACETAMINOPHEN 500 MG PO TABS
500.0000 mg | ORAL_TABLET | Freq: Four times a day (QID) | ORAL | Status: AC
  Administered 2021-03-18 – 2021-03-19 (×4): 500 mg via ORAL
  Filled 2021-03-18 (×4): qty 1

## 2021-03-18 MED ORDER — METHOCARBAMOL 500 MG PO TABS
500.0000 mg | ORAL_TABLET | Freq: Four times a day (QID) | ORAL | Status: DC | PRN
  Administered 2021-03-22: 500 mg via ORAL
  Filled 2021-03-18 (×2): qty 1

## 2021-03-18 MED ORDER — MAGNESIUM HYDROXIDE 400 MG/5ML PO SUSP
30.0000 mL | Freq: Every day | ORAL | Status: DC | PRN
  Filled 2021-03-18: qty 30

## 2021-03-18 MED ORDER — SODIUM CHLORIDE 0.9 % IV SOLN: INTRAVENOUS | Status: DC

## 2021-03-18 MED ORDER — SIMVASTATIN 20 MG PO TABS
20.0000 mg | ORAL_TABLET | Freq: Every day | ORAL | Status: DC
  Administered 2021-03-19 – 2021-03-23 (×5): 20 mg via ORAL
  Filled 2021-03-18 (×5): qty 1

## 2021-03-18 MED ORDER — METOCLOPRAMIDE HCL 5 MG/ML IJ SOLN: 5.0000 mg | Freq: Three times a day (TID) | INTRAMUSCULAR | Status: DC | PRN

## 2021-03-18 MED ORDER — ONDANSETRON HCL 4 MG/2ML IJ SOLN
INTRAMUSCULAR | Status: DC | PRN
  Administered 2021-03-18: 4 mg via INTRAVENOUS

## 2021-03-18 MED ORDER — AMLODIPINE BESYLATE 5 MG PO TABS
5.0000 mg | ORAL_TABLET | Freq: Every day | ORAL | Status: DC
  Administered 2021-03-19 – 2021-03-23 (×5): 5 mg via ORAL
  Filled 2021-03-18 (×5): qty 1

## 2021-03-18 MED ORDER — 0.9 % SODIUM CHLORIDE (POUR BTL) OPTIME
TOPICAL | Status: DC | PRN
  Administered 2021-03-18: 1000 mL

## 2021-03-18 MED ORDER — ACETAMINOPHEN 500 MG PO TABS
1000.0000 mg | ORAL_TABLET | Freq: Once | ORAL | Status: DC
  Filled 2021-03-18: qty 2

## 2021-03-18 MED ORDER — BISACODYL 5 MG PO TBEC: 5.0000 mg | DELAYED_RELEASE_TABLET | Freq: Every day | ORAL | Status: DC | PRN

## 2021-03-18 MED ORDER — DOCUSATE SODIUM 100 MG PO CAPS: 100.0000 mg | ORAL_CAPSULE | Freq: Two times a day (BID) | ORAL | Status: DC

## 2021-03-18 MED ORDER — POLYETHYLENE GLYCOL 3350 17 G PO PACK: 17.0000 g | PACK | Freq: Every day | ORAL | Status: DC | PRN

## 2021-03-18 MED ORDER — FENTANYL CITRATE (PF) 100 MCG/2ML IJ SOLN
INTRAMUSCULAR | Status: AC
  Filled 2021-03-18: qty 2

## 2021-03-18 MED ORDER — ONDANSETRON HCL 4 MG/2ML IJ SOLN: 4.0000 mg | Freq: Once | INTRAMUSCULAR | Status: DC | PRN

## 2021-03-18 MED ORDER — ENOXAPARIN SODIUM 30 MG/0.3ML IJ SOSY
30.0000 mg | PREFILLED_SYRINGE | INTRAMUSCULAR | Status: DC
  Administered 2021-03-19 – 2021-03-23 (×5): 30 mg via SUBCUTANEOUS
  Filled 2021-03-18 (×5): qty 0.3

## 2021-03-18 MED ORDER — HYDROCODONE-ACETAMINOPHEN 7.5-325 MG PO TABS
1.0000 | ORAL_TABLET | Freq: Once | ORAL | Status: DC | PRN
Start: 2021-03-18 — End: 2021-03-18
  Filled 2021-03-18: qty 1

## 2021-03-18 MED ORDER — PROPOFOL 10 MG/ML IV BOLUS
INTRAVENOUS | Status: AC
  Filled 2021-03-18: qty 20

## 2021-03-18 MED ORDER — CEFAZOLIN SODIUM-DEXTROSE 2-3 GM-%(50ML) IV SOLR
INTRAVENOUS | Status: DC | PRN
  Administered 2021-03-18: 2 g via INTRAVENOUS

## 2021-03-18 SURGICAL SUPPLY — 59 items
BIT DRILL 4.3MMS DISTAL GRDTED (BIT) ×1 IMPLANT
BLADE SURG SZ10 CARB STEEL (BLADE) ×8 IMPLANT
BNDG COHESIVE 4X5 TAN ST LF (GAUZE/BANDAGES/DRESSINGS) ×2 IMPLANT
BNDG ELASTIC 4X5.8 VLCR STR LF (GAUZE/BANDAGES/DRESSINGS) ×2 IMPLANT
BNDG ELASTIC 6X5.8 VLCR NS LF (GAUZE/BANDAGES/DRESSINGS) ×2 IMPLANT
BNDG ELASTIC 6X5.8 VLCR STR LF (GAUZE/BANDAGES/DRESSINGS) ×2 IMPLANT
BNDG PLASTER FAST 4X5 WHT LF (CAST SUPPLIES) ×8 IMPLANT
BNDG PLASTER FAST 6X5 WHT LF (CAST SUPPLIES) ×8 IMPLANT
CHLORAPREP W/TINT 26 (MISCELLANEOUS) ×4 IMPLANT
COVER BACK TABLE REUSABLE LG (DRAPES) ×2 IMPLANT
CUFF TOURN SGL QUICK 24 (TOURNIQUET CUFF)
CUFF TOURN SGL QUICK 34 (TOURNIQUET CUFF)
CUFF TRNQT CYL 24X4X16.5-23 (TOURNIQUET CUFF) IMPLANT
CUFF TRNQT CYL 34X4.125X (TOURNIQUET CUFF) IMPLANT
DRAPE 3/4 80X56 (DRAPES) ×4 IMPLANT
DRAPE C-ARM XRAY 36X54 (DRAPES) ×4 IMPLANT
DRAPE C-ARMOR (DRAPES) ×4 IMPLANT
DRAPE INCISE IOBAN 66X45 STRL (DRAPES) ×2 IMPLANT
DRAPE U-SHAPE 47X51 STRL (DRAPES) ×2 IMPLANT
DRILL 4.3MMS DISTAL GRADUATED (BIT) ×2
DRSG OPSITE POSTOP 4X6 (GAUZE/BANDAGES/DRESSINGS) ×4 IMPLANT
ELECT REM PT RETURN 9FT ADLT (ELECTROSURGICAL) ×4
ELECTRODE REM PT RTRN 9FT ADLT (ELECTROSURGICAL) ×2 IMPLANT
GAUZE 4X4 16PLY ~~LOC~~+RFID DBL (SPONGE) ×2 IMPLANT
GAUZE SPONGE 4X4 12PLY STRL (GAUZE/BANDAGES/DRESSINGS) ×4 IMPLANT
GAUZE XEROFORM 1X8 LF (GAUZE/BANDAGES/DRESSINGS) ×2 IMPLANT
GAUZE XEROFORM 4X4 STRL (GAUZE/BANDAGES/DRESSINGS) ×2 IMPLANT
GLOVE SURG ENC MOIS LTX SZ7.5 (GLOVE) ×4 IMPLANT
GLOVE SURG ENC MOIS LTX SZ8 (GLOVE) ×4 IMPLANT
GLOVE SURG UNDER LTX SZ8 (GLOVE) ×6 IMPLANT
GLOVE SURG XRAY 8.0 LX (GLOVE) ×2 IMPLANT
GOWN STRL REUS W/ TWL LRG LVL3 (GOWN DISPOSABLE) ×2 IMPLANT
GOWN STRL REUS W/ TWL XL LVL3 (GOWN DISPOSABLE) ×2 IMPLANT
GOWN STRL REUS W/TWL LRG LVL3 (GOWN DISPOSABLE) ×4
GOWN STRL REUS W/TWL XL LVL3 (GOWN DISPOSABLE) ×4
GUIDEPIN VERSANAIL DSP 3.2X444 (ORTHOPEDIC DISPOSABLE SUPPLIES) ×2 IMPLANT
GUIDEWIRE BALL NOSE 80CM (WIRE) ×2 IMPLANT
KIT TURNOVER KIT A (KITS) ×2 IMPLANT
MANIFOLD NEPTUNE II (INSTRUMENTS) ×4 IMPLANT
NAIL HIP FRAC RT 130 11MX400M (Nail) ×2 IMPLANT
NS IRRIG 1000ML POUR BTL (IV SOLUTION) ×4 IMPLANT
PACK EXTREMITY ARMC (MISCELLANEOUS) ×2 IMPLANT
PAD ABD DERMACEA PRESS 5X9 (GAUZE/BANDAGES/DRESSINGS) ×4 IMPLANT
PAD CAST CTTN 4X4 STRL (SOFTGOODS) ×2 IMPLANT
PADDING CAST BLEND 4X4 NS (MISCELLANEOUS) ×6 IMPLANT
PADDING CAST COTTON 4X4 STRL (SOFTGOODS) ×4
SCREW BONE CORTICAL 5.0X38 (Screw) ×2 IMPLANT
SCREW BONE CORTICAL 5.0X44 (Screw) ×2 IMPLANT
SCREW LAG HIP NAIL 10.5X95 (Screw) ×2 IMPLANT
SPONGE T-LAP 18X18 ~~LOC~~+RFID (SPONGE) ×4 IMPLANT
STAPLER SKIN PROX 35W (STAPLE) ×4 IMPLANT
STOCKINETTE M/LG 89821 (MISCELLANEOUS) ×2 IMPLANT
STRAP SAFETY 5IN WIDE (MISCELLANEOUS) ×2 IMPLANT
SUT PROLENE 4 0 PS 2 18 (SUTURE) ×2 IMPLANT
SUT VIC AB 0 CT1 36 (SUTURE) ×4 IMPLANT
SUT VIC AB 1 CT1 36 (SUTURE) ×2 IMPLANT
SUT VIC AB 2-0 CT1 (SUTURE) ×2 IMPLANT
TOWEL OR 17X26 4PK STRL BLUE (TOWEL DISPOSABLE) ×4 IMPLANT
WATER STERILE IRR 500ML POUR (IV SOLUTION) ×4 IMPLANT

## 2021-03-18 NOTE — Op Note (Signed)
03/18/2021  3:31 PM  Patient:   Tracy Morales  Pre-Op Diagnosis:   Closed displaced proximal femoral shaft/subtrochanteric fracture, right femur.  Post-Op Diagnosis:   Same  Procedure:   Reduction and internal fixation of displaced proximal femoral shaft/subtrochanteric right hip fracture with Biomet Affixis TFN nail.  Surgeon:   Maryagnes Amos, MD  Assistant:   None  Anesthesia:   GET  Findings:   As above  Complications:   None  EBL:   75 cc  Fluids:   900 cc crystalloid  UOP:   350 cc  TT:   None  Drains:   None  Closure:   Staples  Implants:   Biomet Affixis 11 x 400 mm TFN with a 95 mm lag screw and 38 and 44 mm distal interlocking screws  Brief Clinical Note:   The patient is a 87 year old female who sustained above-noted injury earlier this morning when she apparently fell in her home. The patient was brought to the emergency room where x-rays demonstrated the above-noted injury. The patient has been cleared medically and presents at this time for reduction and internal fixation of the closed displaced proximal femoral shaft/subtrochanteric right femur fracture.  Procedure:   The patient was brought into the operating room and lain in the supine position. After adequate general endotracheal intubation and anesthesia were obtained, the patient was repositioned on the fracture table. The uninjured leg was placed in a flexed and abducted position while the injured lower extremity was placed in longitudinal traction. The fracture was reduced using longitudinal traction and internal rotation. The adequacy of reduction was verified fluoroscopically in AP and lateral projections and found to be near anatomic. The lateral aspects of the right hip and thigh were prepped with ChloraPrep solution before being draped sterilely. Preoperative antibiotics were administered. A timeout was performed to verify the appropriate surgical site.   The greater trochanter was identified  fluoroscopically and an approximately 6-7 cm incision made about 2-3 fingerbreadths above the tip of the greater trochanter. The incision was carried down through the subcutaneous tissues to expose the gluteal fascia. This was split the length of the incision, providing access to the tip of the trochanter. Under fluoroscopic guidance, a guidewire was drilled through the tip of the trochanter into the proximal metaphysis to the level of the lesser trochanter. After verifying its position fluoroscopically in AP and lateral projections, it was overreamed with the initial reamer to the depth of the lesser trochanter. A guidewire was passed down through the femoral canal to the supracondylar region. The adequacy of guidewire position was verified fluoroscopically in AP and lateral projections before the length of the guidewire within the canal was measured and found to be 425 mm. Therefore, a 400 mm length nail was selected. The guidewire was overreamed sequentially using the flexible reamers, beginning with a 9.5 mm reamer and progressing to a 12.5 mm reamer. This provided good cortical chatter. The 11 x 400 mm Biomet Affixis TFN rod was selected and advanced to the appropriate depth, as verified fluoroscopically.   The guide system for the lag screw was positioned and advanced through an approximately 2 cm stab incision over the lateral aspect of the proximal femur. The guidewire was drilled up through the trochanteric femoral nail and into the femoral neck to rest within 5 mm of subchondral bone. After verifying its position in the femoral neck and head in both AP and lateral projections, the guidewire was measured and found to be optimally replicated  by a 95 mm lag screw. The guidewire was overreamed to the appropriate depth before the lag screw was inserted and advanced to the appropriate depth as verified fluoroscopically in AP and lateral projections. The locking screw was advanced, then backed off a quarter  turn to set the lag screw. Again the adequacy of hardware position and fracture reduction was verified fluoroscopically in AP and lateral projections and found to be excellent.  Attention was directed distally. Using the "perfect circle" technique, the leg and fluoroscopy machine were positioned appropriately. An approximately 1.5 cm stab incision was made over the skin at the appropriate point before the drill bit was advanced through the cortex and across the static hole of the nail. The appropriate length of the screw was determined before the 38 mm distal interlocking screw was positioned, then advanced and tightened securely. Again the adequacy of screw position was verified fluoroscopically in AP and lateral projections and found to be excellent. Given that this was a proximal femoral shaft fracture, it was felt best to put a second distal interlocking screw in. Therefore, using the "perfect circle" technique, a 44 mm distal interlocking screw was positioned through the dynamic hole and tightened securely. Once more, the adequacy of screw position was verified fluoroscopically in AP and lateral projections and found to be excellent.  The wounds were irrigated thoroughly with sterile saline solution before the abductor fascia was reapproximated using #0 Vicryl interrupted sutures. The subcutaneous tissues were closed using 2-0 Vicryl interrupted sutures. The skin was closed using staples. A total of 30 cc of 0.5% Sensorcaine with epinephrine was injected in and around all incisions. Sterile occlusive dressings were applied to all wounds before the patient was awakened, extubated, and returned to the recovery room in satisfactory condition after tolerating the procedure well.

## 2021-03-18 NOTE — ED Notes (Signed)
Patient laying quietly in bed, warm blankets provided. Daughter at bedside, patient denies further needs at this time. Awaiting transportation to take patient to OR.

## 2021-03-18 NOTE — ED Triage Notes (Addendum)
Pt presents to ER via ems from home after falling when trying to get up from bs commode to the bed.  Pt has some right knee swelling noted. + Pedal pulses and able to move feet BIL.  Pt A&O x4 and in no acute distress.  Pt denies hitting head, no LOC.  Pt is not on blood thinners.

## 2021-03-18 NOTE — Transfer of Care (Signed)
Immediate Anesthesia Transfer of Care Note  Patient: Tracy Morales  Procedure(s) Performed: INTRAMEDULLARY (IM) NAIL FEMORAL (Right)  Patient Location: PACU  Anesthesia Type:General  Level of Consciousness: awake  Airway & Oxygen Therapy: Patient Spontanous Breathing  Post-op Assessment: Report given to RN  Post vital signs: stable  Last Vitals:  Vitals Value Taken Time  BP    Temp    Pulse 80 03/18/21 1528  Resp 16 03/18/21 1528  SpO2 100 % 03/18/21 1528  Vitals shown include unvalidated device data.  Last Pain:  Vitals:   03/18/21 1203  TempSrc: Oral  PainSc:          Complications: No notable events documented.

## 2021-03-18 NOTE — ED Notes (Signed)
Patient transported to OR.

## 2021-03-18 NOTE — Anesthesia Preprocedure Evaluation (Addendum)
Anesthesia Evaluation  Patient identified by MRN, date of birth, ID band Patient awake    Reviewed: Allergy & Precautions, NPO status , Patient's Chart, lab work & pertinent test results  Airway Mallampati: II  TM Distance: >3 FB Neck ROM: Full    Dental  (+) Edentulous Upper, Edentulous Lower   Pulmonary shortness of breath,  Hx COVID-20 January 2021 +snoring          Cardiovascular hypertension, + Peripheral Vascular Disease       Neuro/Psych PSYCHIATRIC DISORDERS  Neuromuscular disease    GI/Hepatic   Endo/Other    Renal/GU Renal InsufficiencyRenal disease     Musculoskeletal  (+) Arthritis ,   Abdominal   Peds  Hematology   Anesthesia Other Findings   Reproductive/Obstetrics                            Anesthesia Physical Anesthesia Plan  ASA: 4 and emergent  Anesthesia Plan: General   Post-op Pain Management:    Induction: Intravenous  PONV Risk Score and Plan:   Airway Management Planned: Oral ETT  Additional Equipment:   Intra-op Plan:   Post-operative Plan: Extubation in OR  Informed Consent: I have reviewed the patients History and Physical, chart, labs and discussed the procedure including the risks, benefits and alternatives for the proposed anesthesia with the patient or authorized representative who has indicated his/her understanding and acceptance.       Plan Discussed with:   Anesthesia Plan Comments: (OSA score 3)       Anesthesia Quick Evaluation

## 2021-03-18 NOTE — ED Provider Notes (Signed)
[facility] Emergency Department Provider Note  ____________________________________________   Event Date/Time   First MD Initiated Contact with Patient 03/18/21 [id number]     (approximate)  I have reviewed the triage vital signs and the nursing notes.   HISTORY  Chief Complaint Fall and Knee Pain    HPI Tracy Morales is a 87 y.o. female with hypertension, on a full dose aspirin last taken yesterday coming from home who comes in with a fall.  Patient is supposed to be in a wheelchair but can take a few steps to get to a bedside commode but is not supposed to at nighttime.  Tonight she got up and was trying to get to the commode and had a mechanical fall where her leg gave out.  She states that she is not sure if she hit her head.  No LOC that we know of.  Patient complaining of pain in her right knee, constant, nothing makes it better but worse with movement.          Past Medical History:  Diagnosis Date   Allergic rhinitis, cause unspecified    Bradycardia    Cerebrovascular disease, unspecified    Chronic kidney disease, unspecified    chronic renal insufficiency   Dizziness and giddiness    DVT (deep venous thrombosis) (HCC)    R   Dyspnea    echo with normal EF. 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   HTN (hypertension)    Hyperlipidemia    Hypertonicity of bladder    hx   Mild depression    Mononeuritis of lower limb, unspecified    peripheral neuropathy, bilateral   Occlusion and stenosis of carotid artery without mention of cerebral infarction 4/09   u/s L 40-59%, R 0-39%; stable for many years   Osteoarthrosis, unspecified whether generalized or localized, unspecified site    Peripheral vascular disease, unspecified (HCC)    Polymyalgia rheumatica (HCC)    hx   RUQ pain    Urge incontinence     Patient Active Problem List   Diagnosis Date Noted   COVID-19 01/19/2021   Pincer nail deformity  07/04/2020   Phlebitis 05/20/2020   Left leg pain 02/19/2020   Ankle sprain 01/15/2020   Seborrheic dermatitis of scalp 08/27/2019   Elevated random blood glucose level 05/11/2019   Pain due to onychomycosis of toenails of both feet 12/04/2018   Recurrent UTI 09/22/2018   Low back pain 09/15/2018   MCI (mild cognitive impairment) 05/11/2018   Frequent urination 02/28/2018   Mobility impaired 05/06/2017   Osteoarthritis of both hips 04/15/2015   Hip pain 04/14/2015   Encounter for general adult medical examination with abnormal findings 04/12/2015   Hair loss 11/19/2014   Loss of weight 06/09/2014   Arthritis of right shoulder region 06/09/2014   Nodule of left lung 06/09/2014   Encounter for Medicare annual wellness exam 10/30/2013   H/O fall 04/27/2013   Risk for falls 04/11/2012   Leg pain, bilateral 01/29/2012   MICROSCOPIC HEMATURIA 08/29/2009   CONDUCTIVE HEARING LOSS BILATERAL 08/08/2009   EDEMA 04/07/2009   BRADYCARDIA 10/26/2008   DYSPNEA 10/26/2008   INCONTINENCE, URGE 02/27/2008   Hyperlipidemia 10/11/2006   PERIPHERAL NEUROPATHY, LOWER EXTREMITIES, BILATERAL 10/11/2006   Essential hypertension 10/11/2006   DIASTOLIC DYSFUNCTION 10/11/2006   CAROTID STENOSIS 10/11/2006   CEREBROVASCULAR DISEASE 10/11/2006   PERIPHERAL VASCULAR DISEASE 10/11/2006   ALLERGIC RHINITIS 10/11/2006   Chronic  kidney disease 10/11/2006   OVERACTIVE BLADDER 10/11/2006   OSTEOARTHRITIS 10/11/2006   CARDIAC MURMUR, HX OF 10/11/2006    Past Surgical History:  Procedure Laterality Date   2D echo  8/06   mild aortic stenosis.    arterial doppler     carotid doppler  4/02   bilateral plaque    carotid doppler  8/06   non significant stenosis   carotid doppler     R 40%; L 40-60%   CATARACT EXTRACTION     CHOLECYSTECTOMY  2000   COLONOSCOPY  1/04   polyps   CT SCAN  05/25/08   abd and pelvis- inflated urinary bladder and large amt stool throughout w/o blockage    dexa  2/01    osteopenia   echo     mild LVH EF 50% As   triger finger contracture  6/04   vert. basilar insufficiency  2002   zoster  5/02    Prior to Admission medications   Medication Sig Start Date End Date Taking? Authorizing Provider  acetaminophen (TYLENOL) 500 MG tablet Take 500 mg by mouth every 6 (six) hours as needed. OTC-UAD    [provider]  amLODipine (NORVASC) 5 MG tablet TAKE 1 TABLET BY MOUTH EVERY DAY 11/25/20   Tower, Audrie Gallus, MD  aspirin 325 MG EC tablet Take 325 mg by mouth daily.    [provider]  benzonatate (TESSALON) 200 MG capsule Take 1 capsule (200 mg total) by mouth 3 (three) times daily as needed for cough. Swallow whole, do not bite pill 01/30/21   Tower, Audrie Gallus, MD  Cholecalciferol (VITAMIN D3) 2000 units capsule Take 2,000 Units by mouth daily.    [provider]  ciclopirox (LOPROX) 0.77 % SUSP APPLY TO SCALP 3 TIMES WEEKLY AND LEAVE IN AS DIRECTED. NOT COVERED BY PLAN 02/22/21   Moye, [place], MD  clobetasol (TEMOVATE) 0.05 % external solution APPLY TO SCALP ONCE DAILY AS NEEDED FOR ITCH. USE ON SCALP. AVOID FACE / GROIN / UNDERARM 02/21/21   Moye, [place], MD  CRANBERRY PO Take 1 tablet by mouth daily.    [provider]  ferrous sulfate 325 (65 FE) MG tablet Take 1 tablet (325 mg total) by mouth 2 (two) times daily. 05/09/18   Tower, Audrie Gallus, MD  Fluocinolone Acetonide Body 0.01 % OIL Apply 1 application topically daily. 02/21/21 08/20/21  Moye, [place], MD  hydrocortisone 2.5 % cream apply to affected area on Eyebrows twice daily for up to 2 week. Use on FACE 03/03/20   Moye, [place], MD  ketoconazole (NIZORAL) 2 % shampoo MESSAGE INTO SCALP 3 TIMES A WEEK, LEAVE ON FOR 10 MINUTES 11/28/20   Moye, [place], MD  Meclizine HCl (BONINE PO) Take by mouth as needed.    [provider]  mirtazapine (REMERON) 30 MG tablet TAKE 1 TABLET BY MOUTH EVERYDAY AT BEDTIME 02/17/21   Tower, Audrie Gallus, MD  NON Fredricka Bonine with wheels - for  use with ambulation once daily. 429.9, 715.90, 355.9    [provider]  psyllium (METAMUCIL) 58.6 % packet Take 1 packet by mouth daily as needed.    [provider]  simvastatin (ZOCOR) 20 MG tablet TAKE 1 TABLET BY MOUTH EVERY DAY 11/25/20   Tower, Audrie Gallus, MD    Allergies Ace inhibitors, Donepezil, and Sulfa antibiotics  Family History  Problem Relation Age of Onset   Hypertension Father    Other Father  Cardiac problems   Heart disease Father    Other Brother        blood clot   Other Sister        benign breast nodule    Social History Social History   Tobacco Use   Smoking status: Never   Smokeless tobacco: Never   Tobacco comments:    non smoker   Vaping Use   Vaping Use: Never used  Substance Use Topics   Alcohol use: No    Alcohol/week: 0.0 standard drinks   Drug use: No      Review of Systems Constitutional: No fever/chills Eyes: No visual changes. ENT: No sore throat. Cardiovascular: Denies chest pain. Respiratory: Denies shortness of breath. Gastrointestinal: No abdominal pain.  No nausea, no vomiting.  No diarrhea.  No constipation. Genitourinary: Negative for dysuria. Musculoskeletal: Negative for back pain.  Right knee pain Skin: Negative for rash. Neurological: Negative for headaches, focal weakness or numbness. All other ROS negative ____________________________________________   PHYSICAL EXAM:  VITAL SIGNS: ED Triage Vitals  Enc Vitals Group     BP 03/18/21 0549 (!) 163/82     Pulse Rate 03/18/21 0549 76     Resp 03/18/21 0549 16     Temp 03/18/21 0549 98 F (36.7 C)     Temp Source 03/18/21 0549 Oral     SpO2 03/18/21 0549 98 %     Weight 03/18/21 0550 123 lb 7.3 oz (56 kg)     Height --      Head Circumference --      Peak Flow --      Pain Score 03/18/21 0549 4     Pain Loc --      Pain Edu? --      Excl. in GC? --     Constitutional: Alert, well-appearing Eyes: Conjunctivae are normal.  EOMI. Head: Atraumatic. Nose: No congestion/rhinnorhea. Mouth/Throat: Mucous membranes are moist.   Neck: No stridor. Trachea Midline. FROM Cardiovascular: Normal rate, regular rhythm. Grossly normal heart sounds.  Good peripheral circulation. Respiratory: Normal respiratory effort.  No retractions. Lungs CTAB. Gastrointestinal: Soft and nontender. No distention. No abdominal bruits.  Musculoskeletal: Little swelling noted on the right knee 2+ distal pulse.  However patient seems to be little bit more tender in the mid femur as well.  Sensation intact. Neurologic:  Normal speech and language. No gross focal neurologic deficits are appreciated.  Skin:  Skin is warm, dry and intact. No rash noted. Psychiatric: Mood and affect are normal. Speech and behavior are normal. GU: Deferred   ____________________________________________   LABS (all labs ordered are listed, but only abnormal results are displayed)  Labs Reviewed  RESP PANEL BY RT-PCR (FLU A&B, COVID) [id number]  CBC WITH DIFFERENTIAL/PLATELET  BASIC METABOLIC PANEL  PROTIME-INR  APTT   ____________________________________________   ED ECG REPORT I, Concha Se, the attending physician, personally viewed and interpreted this ECG.  Sinus rate of 81 with left bundle branch block, no ST elevation negative scar Bosa, T wave version aVL  Reviewed prior EKG and this was similar to prior ____________________________________________  RADIOLOGY I, Concha Se, personally viewed and evaluated these images (plain radiographs) as part of my medical decision making, as well as reviewing the written report by the radiologist.  ED MD interpretation: Fracture noted of the femur Official radiology report(s): DG Knee 2 Views Right  Result Date: 03/18/2021 CLINICAL DATA:  Fall, RIGHT knee swelling. EXAM: RIGHT KNEE - 1-2 VIEW COMPARISON:  None. FINDINGS:  No osseous fracture or acute dislocation. Degenerative narrowing of the medial  compartment, at least moderate in degree with associated osseous spurring. Milder degenerative changes at the lateral and patellofemoral compartments. No appreciable joint effusion. Vascular calcifications involving the distal SFA and popliteal arteries. IMPRESSION: 1. No acute findings. No osseous fracture or dislocation. 2. Degenerative changes, at least moderate in degree at the medial compartment. Electronically Signed   By: Bary Richard M.D.   On: 03/18/2021 06:23   CT HEAD WO CONTRAST ( )  Result Date: 03/18/2021 CLINICAL DATA:  Fall, head trauma EXAM: CT HEAD WITHOUT CONTRAST TECHNIQUE: Contiguous axial images were obtained from the base of the skull through the vertex without intravenous contrast. COMPARISON:  CT head 02/14/2019 FINDINGS: Brain: There is no acute intracranial hemorrhage, extra-axial fluid collection, or acute infarct. There is mild global parenchymal volume loss and chronic white matter microangiopathy. The ventricles are not enlarged. There is no mass lesion. There is no midline shift. Vascular: There is calcification of the bilateral cavernous ICAs. Skull: Normal. Negative for fracture or focal lesion. Sinuses/Orbits: The imaged paranasal sinuses are clear. Bilateral lens implants are in place. The globes and orbits are otherwise unremarkable. Other: None. IMPRESSION: No acute intracranial hemorrhage or calvarial fracture. Electronically Signed   By: Lesia Hausen M.D.   On: 03/18/2021 09:05   CT Cervical Spine Wo Contrast  Result Date: 03/18/2021 CLINICAL DATA:  Fall EXAM: CT CERVICAL SPINE WITHOUT CONTRAST TECHNIQUE: Multidetector CT imaging of the cervical spine was performed without intravenous contrast. Multiplanar CT image reconstructions were also generated. COMPARISON:  CTA neck 02/14/2019 FINDINGS: Alignment: There is trace anterolisthesis of C3 on C4, grade 1 retrolisthesis of C5 on C6, and grade 1 anterolisthesis of C7 on T1, not significantly changed since 2020 and  likely degenerative in nature. There is no jumped or perched facet or other evidence of traumatic malalignment. Skull base and vertebrae: Skull base alignment is maintained. Vertebral body heights are preserved. There is no evidence of acute fracture. Soft tissues and spinal canal: No prevertebral fluid or swelling. No visible canal hematoma. Disc levels: There is marked intervertebral disc space narrowing with associated uncovertebral arthropathy at C5-C6 and C6-C7. There is multilevel facet arthropathy with ankylosis of the left posterior elements at C4-C5. The spinal canal is patent. There is severe right neural foraminal stenosis at C4-C5 and severe bilateral neural foraminal stenosis at C5-C6. Upper chest: There is scarring and bullous change in the left lung apex, similar to the prior CTA neck. The right lung apex is clear. Other: There is calcified atherosclerotic plaque of the bilateral carotid bulbs. IMPRESSION: 1. No acute fracture or traumatic malalignment of the cervical spine. 2. Multilevel spondylosis and spondylolisthesis as above, most advanced at C5-C6. Electronically Signed   By: Lesia Hausen M.D.   On: 03/18/2021 09:09   DG Chest Portable 1 View  Result Date: 03/18/2021 CLINICAL DATA:  Preop chest x-ray EXAM: PORTABLE CHEST 1 VIEW COMPARISON:  11/07/2016 FINDINGS: Chronic cardiomegaly with mild aortic tortuosity. There is no edema, consolidation, effusion, or pneumothorax. Remote compression fracture at the thoracolumbar junction. IMPRESSION: Stable exam.  No acute finding. Electronically Signed   By: Tiburcio Pea M.D.   On: 03/18/2021 09:00   DG Hip Unilat W or Wo Pelvis 2-3 Views Right  Result Date: 03/18/2021 CLINICAL DATA:  Fall with hip injury EXAM: DG HIP (WITH OR WITHOUT PELVIS) 2-3V RIGHT COMPARISON:  04/15/2015 FINDINGS: Acute fracture of the right femoral shaft with marked displacement and over riding.  Fracture is oblique with sharp margins. Osteopenia and atherosclerosis.  IMPRESSION: Highly displaced upper right femoral shaft fracture. Electronically Signed   By: Tiburcio Pea M.D.   On: 03/18/2021 09:01    ____________________________________________   PROCEDURES  Procedure(s) performed (including Critical Care):  Procedures   ____________________________________________   INITIAL IMPRESSION / ASSESSMENT AND PLAN / ED COURSE  Tracy Morales was evaluated in Emergency Department on 03/18/2021 for the symptoms described in the history of present illness. She was evaluated in the context of the global COVID-19 pandemic, which necessitated consideration that the patient might be at risk for infection with the SARS-CoV-2 virus that causes COVID-19. Institutional protocols and algorithms that pertain to the evaluation of patients at risk for COVID-19 are in a state of rapid change based on information released by regulatory bodies including the CDC and federal and state organizations. These policies and algorithms were followed during the patient's care in the ED.    Patient is a 87 year old who comes in for a fall.  Patient had x-ray done from triage that was negative for any fractures but her pain seems to be more mid femur so we will get an x-ray of her hip to rule out hip fracture.  Patient is neurovascularly intact at this time.  Given patient's age unclear if she hit her head will get CT head, CT cervical to rule out intracranial hemorrhage, cervical fracture.  X-ray confirms hip fracture.  We will give a dose of IV fentanyl  CT imaging was otherwise reassuring.  There was a spondylolisthesis but patient has no cervical tenderness  Discussed with orthopedics Dr. Joice Lofts who wants to do surgery today so recommends early consultation with the hospitalist in order to facilitate clearance for surgery.  Labs are still pending but I did discuss to the hospital team who will admit patient     ____________________________________________   FINAL CLINICAL  IMPRESSION(S) / ED DIAGNOSES   Final diagnoses:  Fall, initial encounter  Closed displaced transverse fracture of shaft of right femur, initial encounter (HCC)      MEDICATIONS GIVEN DURING THIS VISIT:  Medications  fentaNYL (SUBLIMAZE) injection 25 mcg (25 mcg Intravenous Given 03/18/21 0952)  ondansetron (ZOFRAN) injection 4 mg (4 mg Intravenous Given 03/18/21 6237)     ED Discharge Orders     None        Note:  This document was prepared using Dragon voice recognition software and may include unintentional dictation errors.    Concha Se, MD 03/18/21 [phone]

## 2021-03-18 NOTE — H&P (Signed)
History and Physical    Tracy Morales ZOX:[id number] DOB: [birth year] DOA: 03/18/2021  PCP: Judy Pimple, MD Consultants:  Stacie Acres - podiatry; Thedore Mins - nephrology [facility] - lives with daughter, SIL, intermittently grandson; NOK: daughter, [phone]  Chief Complaint: Fall  HPI: Tracy Morales is a 87 y.o. female with medical history significant of CKD; HLD; PMR; and CVA presenting with a fall.  She called out for help about 0400, found her in the floor beside the bed.  Her right leg was tucked under the other.  Her SIL got her back onto the bed but the leg "wasn't working" - wouldn't move like it was supposed to and couldn't bear weight and then it started swelling.  They called 911 and thought maybe it was dislocated and brought her in.  She was in pain.  She is more comfortable now, with pain medication.  She is minimally ambulatory, used to have a walker but now uses a wheelchair and transfers only.  She does frequently get up in the middle of the night.  She self-caths at the bedside commode usually without difficulty.  She self-caths, not on schedule but just as needed, usually about 6 times a day.  She has been doing this for a couple of years.  She has been at baseline prior to the fall.    ED Course: Hip fracture after fall, displaced.  To OR today.   Review of Systems: As per HPI; otherwise review of systems reviewed and negative.   Ambulatory Status: Transfers only, mostly in wheelchair  COVID Vaccine Status:   Complete plus 3 boosters  Past Medical History:  Diagnosis Date   Allergic rhinitis, cause unspecified    Bradycardia    Cerebrovascular disease, unspecified    Chronic kidney disease, unspecified    chronic renal insufficiency   Dizziness and giddiness    DVT (deep venous thrombosis) (HCC)    R   Dyspnea    echo with normal EF. 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   HTN (hypertension)    Hyperlipidemia    Hypertonicity of  bladder    hx   Mild depression    Mononeuritis of lower limb, unspecified    peripheral neuropathy, bilateral   Occlusion and stenosis of carotid artery without mention of cerebral infarction 09/03/2007   u/s L 40-59%, R 0-39%; stable for many years   Osteoarthrosis, unspecified whether generalized or localized, unspecified site    Peripheral vascular disease, unspecified (HCC)    Polymyalgia rheumatica (HCC)    hx    Past Surgical History:  Procedure Laterality Date   2D echo  8/06   mild aortic stenosis.    arterial doppler     carotid doppler  4/02   bilateral plaque    carotid doppler  8/06   non significant stenosis   carotid doppler     R 40%; L 40-60%   CATARACT EXTRACTION     CHOLECYSTECTOMY  2000   COLONOSCOPY  1/04   polyps   CT SCAN  05/25/08   abd and pelvis- inflated urinary bladder and large amt stool throughout w/o blockage    dexa  2/01   osteopenia   echo     mild LVH EF 50% As   triger finger contracture  6/04   vert. basilar insufficiency  2002   zoster  5/02    Social History   Socioeconomic History   Marital status: Widowed  Spouse name: Not on file   Number of children: 2   Years of education: Not on file   Highest education level: Not on file  Occupational History   Occupation: Retired  Tobacco Use   Smoking status: Never   Smokeless tobacco: Never   Tobacco comments:    non smoker   Vaping Use   Vaping Use: Never used  Substance and Sexual Activity   Alcohol use: No    Alcohol/week: 0.0 standard drinks   Drug use: No   Sexual activity: Never  Other Topics Concern   Not on file  Social History Narrative   Widowed. 2 children. Retired, gets regular exercise.    Social Determinants of Health   Financial Resource Strain: Not on file  Food Insecurity: Not on file  Transportation Needs: Not on file  Physical Activity: Not on file  Stress: Not on file  Social Connections: Not on file  Intimate Partner Violence: Not on file     Allergies  Allergen Reactions   Ace Inhibitors     REACTION: cough   Donepezil Diarrhea and Other (See Comments)    GI problems Other reaction(s): Other (See Comments) GI problems   Sulfa Antibiotics Nausea Only    headache    Family History  Problem Relation Age of Onset   Hypertension Father    Other Father        Cardiac problems   Heart disease Father    Other Brother        blood clot   Other Sister        benign breast nodule    Prior to Admission medications   Medication Sig Start Date End Date Taking? Authorizing Provider  acetaminophen (TYLENOL) 500 MG tablet Take 500 mg by mouth every 6 (six) hours as needed. OTC-UAD    [provider]  amLODipine (NORVASC) 5 MG tablet TAKE 1 TABLET BY MOUTH EVERY DAY 11/25/20   Tower, Audrie Gallus, MD  aspirin 325 MG EC tablet Take 325 mg by mouth daily.    [provider]  benzonatate (TESSALON) 200 MG capsule Take 1 capsule (200 mg total) by mouth 3 (three) times daily as needed for cough. Swallow whole, do not bite pill 01/30/21   Tower, Audrie Gallus, MD  Cholecalciferol (VITAMIN D3) 2000 units capsule Take 2,000 Units by mouth daily.    [provider]  ciclopirox (LOPROX) 0.77 % SUSP APPLY TO SCALP 3 TIMES WEEKLY AND LEAVE IN AS DIRECTED. NOT COVERED BY PLAN 02/22/21   Moye, [place], MD  clobetasol (TEMOVATE) 0.05 % external solution APPLY TO SCALP ONCE DAILY AS NEEDED FOR ITCH. USE ON SCALP. AVOID FACE / GROIN / UNDERARM 02/21/21   Moye, [place], MD  CRANBERRY PO Take 1 tablet by mouth daily.    [provider]  ferrous sulfate 325 (65 FE) MG tablet Take 1 tablet (325 mg total) by mouth 2 (two) times daily. 05/09/18   Tower, Audrie Gallus, MD  Fluocinolone Acetonide Body 0.01 % OIL Apply 1 application topically daily. 02/21/21 08/20/21  Moye, [place], MD  hydrocortisone 2.5 % cream apply to affected area on Eyebrows twice daily for up to 2 week. Use on FACE 03/03/20   Moye, [place], MD  ketoconazole  (NIZORAL) 2 % shampoo MESSAGE INTO SCALP 3 TIMES A WEEK, LEAVE ON FOR 10 MINUTES 11/28/20   Moye, [place], MD  Meclizine HCl (BONINE PO) Take by mouth as needed.    [provider]  mirtazapine (REMERON) 30  MG tablet TAKE 1 TABLET BY MOUTH EVERYDAY AT BEDTIME 02/17/21   Tower, Audrie Gallus, MD  NON Fredricka Bonine with wheels - for use with ambulation once daily. 429.9, 715.90, 355.9    [provider]  psyllium (METAMUCIL) 58.6 % packet Take 1 packet by mouth daily as needed.    [provider]  simvastatin (ZOCOR) 20 MG tablet TAKE 1 TABLET BY MOUTH EVERY DAY 11/25/20   Judy Pimple, MD    Physical Exam: Vitals:   03/18/21 0800 03/18/21 0830 03/18/21 1115 03/18/21 1203  BP: (!) 141/74 (!) 179/81 (!) 150/69 (!) 162/90  Pulse: 72 91 81 81  Resp:   16 17  Temp:    98.1 F (36.7 C)  TempSrc:    Oral  SpO2: 98% 92% 100% 97%  Weight:          General:  Appears calm and comfortable and is in NAD; lying very still Eyes:  PERRLA, EOMI, normal lids, iris ENT: hard of hearing,  grossly normal lips & tongue, mmm; edentulous Neck:  no LAD, masses or thyromegaly Cardiovascular:  RRR, no m/r/g. No LE edema.  Respiratory:   CTA bilaterally with no wheezes/rales/rhonchi.  Normal respiratory effort.   Abdomen:  soft, NT, ND Skin:  no rash or induration seen on limited exam Musculoskeletal:  R thigh edema and is bent below L leg with patient unwillingness to move it Psychiatric:  grossly normal mood and affect, speech sparse but appropriate Neurologic:  CN 2-12 grossly intact    Radiological Exams on Admission: Independently reviewed - see discussion in A/P where applicable  DG Knee 2 Views Right  Result Date: 03/18/2021 CLINICAL DATA:  Fall, RIGHT knee swelling. EXAM: RIGHT KNEE - 1-2 VIEW COMPARISON:  None. FINDINGS: No osseous fracture or acute dislocation. Degenerative narrowing of the medial compartment, at least moderate in degree with associated osseous  spurring. Milder degenerative changes at the lateral and patellofemoral compartments. No appreciable joint effusion. Vascular calcifications involving the distal SFA and popliteal arteries. IMPRESSION: 1. No acute findings. No osseous fracture or dislocation. 2. Degenerative changes, at least moderate in degree at the medial compartment. Electronically Signed   By: Bary Richard M.D.   On: 03/18/2021 06:23   CT HEAD WO CONTRAST ( )  Result Date: 03/18/2021 CLINICAL DATA:  Fall, head trauma EXAM: CT HEAD WITHOUT CONTRAST TECHNIQUE: Contiguous axial images were obtained from the base of the skull through the vertex without intravenous contrast. COMPARISON:  CT head 02/14/2019 FINDINGS: Brain: There is no acute intracranial hemorrhage, extra-axial fluid collection, or acute infarct. There is mild global parenchymal volume loss and chronic white matter microangiopathy. The ventricles are not enlarged. There is no mass lesion. There is no midline shift. Vascular: There is calcification of the bilateral cavernous ICAs. Skull: Normal. Negative for fracture or focal lesion. Sinuses/Orbits: The imaged paranasal sinuses are clear. Bilateral lens implants are in place. The globes and orbits are otherwise unremarkable. Other: None. IMPRESSION: No acute intracranial hemorrhage or calvarial fracture. Electronically Signed   By: Lesia Hausen M.D.   On: 03/18/2021 09:05   CT Cervical Spine Wo Contrast  Result Date: 03/18/2021 CLINICAL DATA:  Fall EXAM: CT CERVICAL SPINE WITHOUT CONTRAST TECHNIQUE: Multidetector CT imaging of the cervical spine was performed without intravenous contrast. Multiplanar CT image reconstructions were also generated. COMPARISON:  CTA neck 02/14/2019 FINDINGS: Alignment: There is trace anterolisthesis of C3 on C4, grade 1 retrolisthesis of C5 on C6, and grade 1 anterolisthesis of C7 on  T1, not significantly changed since 2020 and likely degenerative in nature. There is no jumped or perched  facet or other evidence of traumatic malalignment. Skull base and vertebrae: Skull base alignment is maintained. Vertebral body heights are preserved. There is no evidence of acute fracture. Soft tissues and spinal canal: No prevertebral fluid or swelling. No visible canal hematoma. Disc levels: There is marked intervertebral disc space narrowing with associated uncovertebral arthropathy at C5-C6 and C6-C7. There is multilevel facet arthropathy with ankylosis of the left posterior elements at C4-C5. The spinal canal is patent. There is severe right neural foraminal stenosis at C4-C5 and severe bilateral neural foraminal stenosis at C5-C6. Upper chest: There is scarring and bullous change in the left lung apex, similar to the prior CTA neck. The right lung apex is clear. Other: There is calcified atherosclerotic plaque of the bilateral carotid bulbs. IMPRESSION: 1. No acute fracture or traumatic malalignment of the cervical spine. 2. Multilevel spondylosis and spondylolisthesis as above, most advanced at C5-C6. Electronically Signed   By: Lesia Hausen M.D.   On: 03/18/2021 09:09   DG Chest Portable 1 View  Result Date: 03/18/2021 CLINICAL DATA:  Preop chest x-ray EXAM: PORTABLE CHEST 1 VIEW COMPARISON:  11/07/2016 FINDINGS: Chronic cardiomegaly with mild aortic tortuosity. There is no edema, consolidation, effusion, or pneumothorax. Remote compression fracture at the thoracolumbar junction. IMPRESSION: Stable exam.  No acute finding. Electronically Signed   By: Tiburcio Pea M.D.   On: 03/18/2021 09:00   DG Hip Unilat W or Wo Pelvis 2-3 Views Right  Result Date: 03/18/2021 CLINICAL DATA:  Fall with hip injury EXAM: DG HIP (WITH OR WITHOUT PELVIS) 2-3V RIGHT COMPARISON:  04/15/2015 FINDINGS: Acute fracture of the right femoral shaft with marked displacement and over riding. Fracture is oblique with sharp margins. Osteopenia and atherosclerosis. IMPRESSION: Highly displaced upper right femoral shaft  fracture. Electronically Signed   By: Tiburcio Pea M.D.   On: 03/18/2021 09:01    EKG: per Dr. Fuller Plan: NSR with rate 81; LBBB with NSCSLT    Labs on Admission: I have personally reviewed the available labs and imaging studies at the time of the admission.  Pertinent labs:   Glucose 130 BUN 30/Creatinine 1.49/GFR 31 Normal CBC INR 1.0 COVID/flu negative   Assessment/Plan Principal Problem:   Hip fracture (HCC) Active Problems:   Hyperlipidemia   Essential hypertension   DNR (do not resuscitate)    Hip fracture -Mechanical fall resulting in highly displaced R femur fracture -Orthopedics consulted, to OR today -NPO in anticipation of surgical repair today -SCDs overnight, start Lovenox post-operatively (or as per ortho) -EKG and CXR done in ER -Pain control with Robxain, Vicodin, and Morphine prn -TOC team consult for rehab placement -Will need PT consult post-operatively -Hip fracture order set utilized  Pre-operative stratification -Orthopedic/spinal surgery is associated with an intermediate (1-5%) cardiovascular risk for cardiac death and nonfatal MI -With her general poor medical status, her Goldman multifactorial index gives a risk estimate of 8 or moderate risk -Because of this risk, she is recommended to have pre-operative EKG testing prior to surgery -Her Detsky's Modified Cardiac Risk Index score is Class I, with a low cardiac risk -It is reasonable for her to go to the OR without additional evaluation  HTN -Continue Norvasc  HLD -Continue Zocor  Mood d/o -Continue Remeron  DNR -I have discussed code status with the patient's daughter and the patient would not desire resuscitation and would prefer to die a natural death should that  situation arise. -She will need a gold out of facility DNR form at the time of discharge     DVT prophylaxis:  SCDs until approved for Lovenox by orthopedics Code Status:  DNR - confirmed with family Family  Communication: Daughter was present throughout evaluation Disposition Plan:  Likely to need SNF rehab Consults called: Orthopedics; TOC team, Nutrition; will need PT post-operatively  Admission status: Admit - It is my clinical opinion that admission to INPATIENT is reasonable and necessary because of the expectation that this patient will require hospital care that crosses at least 2 midnights to treat this condition based on the medical complexity of the problems presented.  Given the aforementioned information, the predictability of an adverse outcome is felt to be significant.    Jonah Blue MD [facility]   How to contact the [facility] Attending or Consulting provider 7A - 7P or covering provider during after hours 7P -7A, for this patient?  Check the care team in [facility] and look for a) attending/consulting [facility] provider listed and b) the [facility] team listed Log into [email or web address] and use [place]'s universal password to access. If you do not have the password, please contact the hospital operator. Locate the [facility] provider you are looking for under [facility] and page to a number that you can be directly reached. If you still have difficulty reaching the provider, please page the [facility] (Director on Call) for the Hospitalists listed on amion for assistance.   03/18/2021, 12:05 PM

## 2021-03-18 NOTE — Anesthesia Procedure Notes (Signed)
Procedure Name: Intubation Date/Time: 03/18/2021 1:37 PM Performed by: Leander Rams, CRNA Pre-anesthesia Checklist: Patient identified, Emergency Drugs available, Suction available and Patient being monitored Patient Re-evaluated:Patient Re-evaluated prior to induction Oxygen Delivery Method: Circle system utilized Preoxygenation: Pre-oxygenation with 100% oxygen Induction Type: IV induction Ventilation: Mask ventilation without difficulty Laryngoscope Size: Mac and 3 Grade View: Grade I Tube type: Oral Tube size: 6.5 mm Number of attempts: 1 Airway Equipment and Method: Stylet Placement Confirmation: ETT inserted through vocal cords under direct vision, positive ETCO2 and breath sounds checked- equal and bilateral Secured at: 21 cm Tube secured with: Tape Dental Injury: Teeth and Oropharynx as per pre-operative assessment

## 2021-03-18 NOTE — Anesthesia Postprocedure Evaluation (Signed)
Anesthesia Post Note  Patient: Tracy Morales  Procedure(s) Performed: INTRAMEDULLARY (IM) NAIL FEMORAL (Right)  Anesthesia Type: General Anesthetic complications: no   No notable events documented.   Last Vitals:  Vitals:   03/18/21 1600 03/18/21 1618  BP: 135/75 (!) 157/76  Pulse: 81 81  Resp: (!) 21 15  Temp: 36.5 C 36.7 C  SpO2: 100% 100%    Last Pain:  Vitals:   03/18/21 1600  TempSrc:   PainSc: 0-No pain                 Johny Drilling

## 2021-03-18 NOTE — Consult Note (Signed)
ORTHOPAEDIC CONSULTATION  REQUESTING PHYSICIAN: Jonah Blue, MD  Chief Complaint:   Right hip/thigh pain.  History of Present Illness: Tracy Morales is a 87 y.o. female with multiple medical problem, including hypertension, hyperlipidemia, peripheral vascular disease, chronic kidney disease, cerebrovascular disease, polymyalgia rheumatica, and mild depression who lives with her daughter.  She apparently is primarily wheelchair dependent, but will get up for transfers with assistance.  Apparently, the patient was heard calling for help around 4:00 this morning.  The family found the patient on the floor.  She complained of right leg pain and was unable to bear weight, so EMS was called.  She was brought to the emergency room where x-rays demonstrated a displaced fracture of the right proximal femur.  The patient denies any associated injuries.  She did not strike her head or lose consciousness.  The patient also denies any lightheadedness, dizziness, chest pain, shortness of breath, or other symptoms which may have precipitated her fall.  She has been cleared medically for surgical intervention.  Past Medical History:  Diagnosis Date   Allergic rhinitis, cause unspecified    Bradycardia    Cerebrovascular disease, unspecified    Chronic kidney disease, unspecified    chronic renal insufficiency   Dizziness and giddiness    DVT (deep venous thrombosis) (HCC)    R   Dyspnea    echo with normal EF. 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    arterial doppler     carotid doppler  4/02   bilateral plaque    carotid doppler  8/06   non significant stenosis   carotid doppler     R 40%; L 40-60%   CATARACT EXTRACTION     CHOLECYSTECTOMY  2000   COLONOSCOPY  1/04   polyps   CT SCAN  05/25/08   abd and pelvis- inflated urinary bladder and large amt stool throughout w/o blockage    dexa  2/01   osteopenia   echo     mild LVH EF 50% As   triger finger contracture  6/04   vert. basilar insufficiency  2002   zoster  5/02   Social History   Socioeconomic History   Marital status: Widowed    Spouse name: Not on file   Number of children: 2   Years of education: Not on file   Highest education level: Not on file  Occupational History   Occupation: Retired  Tobacco Use   Smoking status: Never   Smokeless tobacco: Never   Tobacco comments:    non smoker   Vaping Use   Vaping Use: Never used  Substance and Sexual Activity   Alcohol use: No    Alcohol/week: 0.0 standard drinks   Drug use: No   Sexual activity: Never  Other Topics Concern   Not on file  Social History Narrative   Widowed. 2 children. Retired, gets regular exercise.    Social Determinants of Health   Financial Resource Strain: Not on file  Food Insecurity: Not on file  Transportation Needs: Not on file  Physical Activity: Not on file  Stress: Not on file  Social Connections: Not on file   Family History  Problem Relation Age of Onset   Hypertension Father    Other Father        Cardiac problems   Heart disease Father    Other Brother        blood clot   Other Sister        benign breast nodule   Allergies  Allergen Reactions   Ace Inhibitors     REACTION: cough   Donepezil Diarrhea and Other (See Comments)    GI  problems Other reaction(s): Other (See Comments) GI problems   Sulfa Antibiotics Nausea Only    headache   Prior to Admission medications   Medication Sig Start Date End Date Taking? Authorizing Provider  acetaminophen (TYLENOL) 500 MG tablet Take 500 mg by mouth every 6 (six) hours as needed. OTC-UAD   Yes [provider]  amLODipine (NORVASC) 5 MG tablet TAKE 1 TABLET BY MOUTH EVERY DAY Patient taking differently: Take 5 mg by mouth daily. 11/25/20  Yes Tower, Audrie Gallus, MD  aspirin 325 MG EC tablet Take 325 mg by mouth daily.   Yes [provider]  Cholecalciferol (VITAMIN D3) 2000 units capsule Take 2,000 Units by mouth daily.   Yes [provider]  ciclopirox (LOPROX) 0.77 % SUSP APPLY TO SCALP 3 TIMES WEEKLY AND LEAVE IN AS DIRECTED. NOT COVERED BY PLAN Patient taking differently: Apply 1 application topically once a week. Usually on Saturdays 02/22/21  Yes Moye, [place], MD  clobetasol (TEMOVATE) 0.05 % external solution APPLY TO SCALP ONCE DAILY AS NEEDED FOR ITCH. USE ON SCALP. AVOID FACE / GROIN / UNDERARM 02/21/21  Yes Moye, [place], MD  CRANBERRY PO Take 1 tablet by mouth daily.   Yes [provider]  ferrous sulfate 325 (65 FE) MG tablet Take 1 tablet (325 mg total) by mouth 2 (two) times daily. 05/09/18  Yes Tower, Audrie Gallus, MD  Fluocinolone Acetonide Body 0.01 % OIL Apply 1 application topically daily. 02/21/21 08/20/21 Yes Moye, [place], MD  hydrocortisone 2.5 % cream apply to affected area on Eyebrows twice daily for up to 2 week. Use on FACE 03/03/20  Yes Moye, [place], MD  ketoconazole (NIZORAL) 2 % shampoo MESSAGE INTO SCALP 3 TIMES A WEEK, LEAVE ON FOR 10 MINUTES Patient taking differently: Apply 1 application topically once a week. Usually on Wednesdays 11/28/20  Yes Moye, [place], MD  Meclizine HCl (BONINE PO) Take 50 mg by mouth as needed.   Yes [provider]  mirtazapine (REMERON) 30 MG tablet TAKE 1 TABLET BY MOUTH EVERYDAY AT  BEDTIME Patient taking differently: Take 30 mg by mouth at bedtime. 02/17/21  Yes Tower, Marne A, MD  psyllium (METAMUCIL) 58.6 % packet Take 1 packet by mouth daily as needed.   Yes [provider]  simvastatin (ZOCOR) 20 MG tablet TAKE 1 TABLET BY MOUTH EVERY DAY Patient taking differently: Take 20 mg by mouth daily. 11/25/20  Yes Tower, Audrie Gallus, MD   DG Knee 2 Views Right  Result Date: 03/18/2021 CLINICAL DATA:  Fall, RIGHT knee swelling. EXAM: RIGHT KNEE - 1-2 VIEW COMPARISON:  None. FINDINGS: No osseous fracture or acute dislocation. Degenerative  narrowing of the medial compartment, at least moderate in degree with associated osseous spurring. Milder degenerative changes at the lateral and patellofemoral compartments. No appreciable joint effusion. Vascular calcifications involving the distal SFA and popliteal arteries. IMPRESSION: 1. No acute findings. No osseous fracture or dislocation. 2. Degenerative changes, at least moderate in degree at the medial compartment. Electronically Signed   By: Bary Richard M.D.   On: 03/18/2021 06:23   CT HEAD WO CONTRAST ( )  Result Date: 03/18/2021 CLINICAL DATA:  Fall, head trauma EXAM: CT HEAD WITHOUT CONTRAST TECHNIQUE: Contiguous axial images were obtained from the base of the skull through the vertex without intravenous contrast. COMPARISON:  CT head 02/14/2019 FINDINGS: Brain: There is no acute intracranial hemorrhage, extra-axial fluid collection, or acute infarct. There is mild global parenchymal volume loss and chronic white matter microangiopathy. The ventricles are not enlarged. There is no mass lesion. There is no midline shift. Vascular: There is calcification of the bilateral cavernous ICAs. Skull: Normal. Negative for fracture or focal lesion. Sinuses/Orbits: The imaged paranasal sinuses are clear. Bilateral lens implants are in place. The globes and orbits are otherwise unremarkable. Other: None. IMPRESSION: No acute intracranial  hemorrhage or calvarial fracture. Electronically Signed   By: Lesia Hausen M.D.   On: 03/18/2021 09:05   CT Cervical Spine Wo Contrast  Result Date: 03/18/2021 CLINICAL DATA:  Fall EXAM: CT CERVICAL SPINE WITHOUT CONTRAST TECHNIQUE: Multidetector CT imaging of the cervical spine was performed without intravenous contrast. Multiplanar CT image reconstructions were also generated. COMPARISON:  CTA neck 02/14/2019 FINDINGS: Alignment: There is trace anterolisthesis of C3 on C4, grade 1 retrolisthesis of C5 on C6, and grade 1 anterolisthesis of C7 on T1, not significantly changed since 2020 and likely degenerative in nature. There is no jumped or perched facet or other evidence of traumatic malalignment. Skull base and vertebrae: Skull base alignment is maintained. Vertebral body heights are preserved. There is no evidence of acute fracture. Soft tissues and spinal canal: No prevertebral fluid or swelling. No visible canal hematoma. Disc levels: There is marked intervertebral disc space narrowing with associated uncovertebral arthropathy at C5-C6 and C6-C7. There is multilevel facet arthropathy with ankylosis of the left posterior elements at C4-C5. The spinal canal is patent. There is severe right neural foraminal stenosis at C4-C5 and severe bilateral neural foraminal stenosis at C5-C6. Upper chest: There is scarring and bullous change in the left lung apex, similar to the prior CTA neck. The right lung apex is clear. Other: There is calcified atherosclerotic plaque of the bilateral carotid bulbs. IMPRESSION: 1. No acute fracture or traumatic malalignment of the cervical spine. 2. Multilevel spondylosis and spondylolisthesis as above, most advanced at C5-C6. Electronically Signed   By: Lesia Hausen M.D.   On: 03/18/2021 09:09   DG Chest Portable 1 View  Result Date: 03/18/2021 CLINICAL DATA:  Preop chest x-ray EXAM: PORTABLE CHEST 1 VIEW COMPARISON:  11/07/2016 FINDINGS: Chronic cardiomegaly with mild aortic  tortuosity. There is no edema, consolidation, effusion, or pneumothorax. Remote compression fracture at the thoracolumbar junction. IMPRESSION: Stable exam.  No acute finding. Electronically Signed   By: Tiburcio Pea M.D.   On: 03/18/2021 09:00   DG Hip Unilat W or Wo Pelvis 2-3 Views Right  Result Date: 03/18/2021 CLINICAL DATA:  Fall with hip injury EXAM: DG HIP (WITH OR WITHOUT PELVIS) 2-3V RIGHT COMPARISON:  04/15/2015 FINDINGS: Acute fracture of the right femoral shaft with marked displacement and over riding. Fracture is oblique with sharp margins. Osteopenia  and atherosclerosis. IMPRESSION: Highly displaced upper right femoral shaft fracture. Electronically Signed   By: Tiburcio Pea M.D.   On: 03/18/2021 09:01    Positive ROS: All other systems have been reviewed and were otherwise negative with the exception of those mentioned in the HPI and as above.  Physical Exam: General:  Alert, no acute distress Psychiatric:  Patient is competent for consent with normal mood and affect   Cardiovascular:  No pedal edema Respiratory:  No wheezing, non-labored breathing GI:  Abdomen is soft and non-tender Skin:  No lesions in the area of chief complaint Neurologic:  Sensation intact distally Lymphatic:  No axillary or cervical lymphadenopathy  Orthopedic Exam:  Orthopedic examination is limited to the right hip and lower extremity.  The right lower extremity is obviously shortened and externally rotated as compared to the left.  Skin inspection around the right hip and thigh is notable for moderate swelling, but otherwise is unremarkable.  No erythema, ecchymosis, abrasions, or other skin abnormalities are identified.  She has mild tenderness to palpation over the anterior lateral aspects of the proximal thigh.  She has more severe pain with any attempted active or passive motion of the right lower extremity.  She is neurovascularly intact to the right lower extremity and foot, demonstrating the  ability to dorsiflex and plantarflex her toes.  Sensation is intact to light touch to all distributions.  She has good capillary refill to her right foot.  X-rays:  X-rays of the pelvis and right hip are available for review and have been reviewed by myself.  These films demonstrate a displaced oblique proximal femoral/subtrochanteric fracture of the right femur.  No significant degenerative changes of the right hip are noted.  No lytic lesions or other acute or chronic bone pathology is noted.  Assessment: Closed displaced right proximal femur/subtrochanteric fracture.  Plan: The treatment options, including both surgical and nonsurgical choices, have been discussed in detail with the patient and her daughter (by phone).  The patient and her daughter would like to proceed with surgical intervention to include an intramedullary nailing of the right femur fracture.  The risks (including bleeding, infection, nerve and/or blood vessel injury, persistent or recurrent pain, loosening or failure of the components, malunion and/or nonunion, need for further surgery, blood clots, strokes, heart attacks or arrhythmias, pneumonia, etc.) and benefits of the surgical procedure were discussed.  The patient and her daughter state their understanding and agree to proceed.  They agree to a blood transfusion if necessary.  A formal written consent will be obtained by the nursing staff.  Thank you for asking me to participate in the care of this most delightful woman.  I will be happy to follow her with you.   Maryagnes Amos, MD  Beeper #:  [phone]  03/18/2021 12:55 PM

## 2021-03-18 NOTE — ED Notes (Signed)
X-ray at bedside

## 2021-03-19 DIAGNOSIS — N1832 Chronic kidney disease, stage 3b: Secondary | ICD-10-CM

## 2021-03-19 DIAGNOSIS — D62 Acute posthemorrhagic anemia: Secondary | ICD-10-CM

## 2021-03-19 DIAGNOSIS — E7800 Pure hypercholesterolemia, unspecified: Secondary | ICD-10-CM

## 2021-03-19 LAB — CBC
HCT: 26 % — ABNORMAL LOW (ref 36.0–46.0)
Hemoglobin: 8.7 g/dL — ABNORMAL LOW (ref 12.0–15.0)
MCH: 29.9 pg (ref 26.0–34.0)
MCHC: 33.5 g/dL (ref 30.0–36.0)
MCV: 89.3 fL (ref 80.0–100.0)
Platelets: 197 10*3/uL (ref 150–400)
RBC: 2.91 MIL/uL — ABNORMAL LOW (ref 3.87–5.11)
RDW: 15 % (ref 11.5–15.5)
WBC: 6.1 10*3/uL (ref 4.0–10.5)
nRBC: 0 % (ref 0.0–0.2)

## 2021-03-19 LAB — BASIC METABOLIC PANEL
Anion gap: 6 (ref 5–15)
BUN: 28 mg/dL — ABNORMAL HIGH (ref 8–23)
CO2: 26 mmol/L (ref 22–32)
Calcium: 8.1 mg/dL — ABNORMAL LOW (ref 8.9–10.3)
Chloride: 104 mmol/L (ref 98–111)
Creatinine, Ser: 1.52 mg/dL — ABNORMAL HIGH (ref 0.44–1.00)
GFR, Estimated: 31 mL/min — ABNORMAL LOW (ref >60)
Glucose, Bld: 106 mg/dL — ABNORMAL HIGH (ref 70–99)
Potassium: 4.5 mmol/L (ref 3.5–5.1)
Sodium: 136 mmol/L (ref 135–145)

## 2021-03-19 MED ORDER — CHLORHEXIDINE GLUCONATE CLOTH 2 % EX PADS
6.0000 | MEDICATED_PAD | Freq: Every day | CUTANEOUS | Status: DC
  Administered 2021-03-20 – 2021-03-23 (×4): 6 via TOPICAL

## 2021-03-19 NOTE — Progress Notes (Signed)
  Progress Note  Tracy Morales   VWU:[id number]  DOB: February 05, 1920  DOA: 03/18/2021     1 Date of Service: 03/19/2021   Clinical Course 87 year old woman presented to the emergency department after mechanical fall at home resulting in right hip pain. --10/15 Admitted for right hip fracture, status post reduction and internal fixation of displaced proximal femoral shaft/subtrochanteric right hip fracture  -- 10/16 postop day 1, no complaints, anemia noted will follow, up with therapy today  Assessment and Plan * Hip fracture (HCC) -- Status post mechanical fall.  Status post surgery 10/15. -- Doing well, up with therapy; complicated by ABLA perioperatively,  ABLA -- Perioperative in nature.  Asymptomatic.  Check CBC in AM.  CKD stage IIIb (HCC) -- Appears to be at baseline  Essential hypertension -- Stable, continue amlodipine  Hyperlipidemia -- Stable.  Continue simvastatin   Subjective:  Feels fine  Objective Vitals:   03/18/21 1950 03/19/21 0335 03/19/21 0746 03/19/21 1108  BP: (!) 159/74 (!) 148/60 (!) 124/56 (!) 129/59  Pulse: 80 76 73 74  Resp: 16 17 16 15   Temp: 98.1 F (36.7 C) 98.2 F (36.8 C) 98 F (36.7 C) 98 F (36.7 C)  TempSrc: Oral Oral Oral   SpO2: 99% 100% 96% 100%  Weight:       56 kg  Vital signs were reviewed and unremarkable.  Exam Physical Exam Constitutional:      General: She is in acute distress.     Appearance: She is not ill-appearing or toxic-appearing.  Cardiovascular:     Rate and Rhythm: Normal rate and regular rhythm.     Heart sounds: Murmur heard.  Pulmonary:     Effort: No respiratory distress.     Breath sounds: No wheezing, rhonchi or rales.  Musculoskeletal:     Right lower leg: No edema.     Left lower leg: No edema.     Comments: Dressing over right hip and knee noted  Neurological:     Mental Status: She is alert.  Psychiatric:        Mood and Affect: Mood normal.        Behavior: Behavior normal.    Labs  / Other Information My review of labs, imaging, notes and other tests is significant for    stable creatinine 1.52, hemoglobin down to 8.7  Disposition Plan: Status is: Inpatient  Remains inpatient appropriate because: Postop day 1 status post hip fracture surgery.  Up with therapy today.  Consult TOC.  Called daughter, no answer  DNR enoxaparin  Time spent: 25 minutes [facility] 03/19/2021, 11:55 AM

## 2021-03-19 NOTE — Assessment & Plan Note (Signed)
Stable.  Continue simvastatin. 

## 2021-03-19 NOTE — Assessment & Plan Note (Addendum)
--   Status post mechanical fall.  Status post surgery 10/15. -- Overall doing well, continue management per orthopedics, planning SNF

## 2021-03-19 NOTE — Assessment & Plan Note (Signed)
Stable, continue amlodipine.  

## 2021-03-19 NOTE — Progress Notes (Signed)
  Subjective: 1 Day Post-Op Procedure(s) (LRB): INTRAMEDULLARY (IM) NAIL FEMORAL (Right) Patient reports pain as moderate.   Patient is well, and has had no acute complaints or problems Negative for chest pain and shortness of breath Fever: no Gastrointestinal: negative for nausea and vomiting.   Objective: Vital signs in last 24 hours: Temp:  [97.4 F (36.3 C)-98.2 F (36.8 C)] 98 F (36.7 C) (10/16 0746) Pulse Rate:  [73-83] 73 (10/16 0746) Resp:  [15-21] 16 (10/16 0746) BP: (124-165)/(56-90) 124/56 (10/16 0746) SpO2:  [96 %-100 %] 96 % (10/16 0746)  Intake/Output from previous day:  Intake/Output Summary (Last 24 hours) at 03/19/2021 0938 Last data filed at 03/19/2021 0600 Gross per 24 hour  Intake 2147.5 ml  Output 1275 ml  Net 872.5 ml    Intake/Output this shift: No intake/output data recorded.  Labs: Recent Labs    03/18/21 0923 03/19/21 0426  HGB 12.1 8.7*   Recent Labs    03/18/21 0923 03/19/21 0426  WBC 6.8 6.1  RBC 4.19 2.91*  HCT 37.0 26.0*  PLT 242 197   Recent Labs    03/18/21 0923 03/19/21 0426  NA 137 136  K 4.1 4.5  CL 102 104  CO2 24 26  BUN 30* 28*  CREATININE 1.49* 1.52*  GLUCOSE 130* 106*  CALCIUM 8.8* 8.1*   Recent Labs    03/18/21 0923  INR 1.0     EXAM General - Patient is Alert, Appropriate, and Oriented Extremity - Neurovascular intact Dorsiflexion/Plantar flexion intact Compartment soft Dressing/Incision -clean, dry, no drainage Motor Function - intact, moving foot and toes well on exam.     Assessment/Plan: 1 Day Post-Op Procedure(s) (LRB): INTRAMEDULLARY (IM) NAIL FEMORAL (Right) Principal Problem:   Hip fracture (HCC) Active Problems:   Hyperlipidemia   Essential hypertension   DNR (do not resuscitate)  Estimated body mass index is 21.19 kg/m as calculated from the following:   Height as of 02/19/20: 5\' 4"  (1.626 m).   Weight as of this encounter: 56 kg. Advance diet Up with therapy  Discharge  per medicine.      DVT Prophylaxis - Lovenox and SCDs Weight-Bearing as tolerated to right leg  , PA-C [facility] 03/19/2021, 9:38 AM

## 2021-03-19 NOTE — Plan of Care (Signed)

## 2021-03-19 NOTE — Assessment & Plan Note (Addendum)
--   Perioperative in nature.  Asymptomatic.  Stable.

## 2021-03-19 NOTE — Assessment & Plan Note (Signed)
-   Appears to be at baseline 

## 2021-03-19 NOTE — Hospital Course (Addendum)
87 year old woman presented to the emergency department after mechanical fall at home resulting in right hip pain. --10/15 Admitted for right hip fracture, status post reduction and internal fixation of displaced proximal femoral shaft/subtrochanteric right hip fracture  -- 10/16 postop day 1, no complaints, anemia noted will follow, up with therapy today -- 10/17, 10/18 no new issues, planning for SNF

## 2021-03-19 NOTE — Evaluation (Signed)
Occupational Therapy Evaluation Patient Details Name: Tracy Morales MRN: [id number] DOB: 05-21-1920 Today's Date: 03/19/2021   History of Present Illness [name] is a 87 y.o. female with multiple medical problem, including hypertension, hyperlipidemia, peripheral vascular disease, chronic kidney disease, cerebrovascular disease, polymyalgia rheumatica, and mild depression who lives with her daughter who presented to the ED on 10/15 after a fall. Pt to underwent R reduction and internal fixation of displaced proximal femoral shaft/subtrochanteric right hip fracture with TFN nail with Dr. Joice Lofts 10/15.   Clinical Impression   Pt seen for OT evaluation this date. Prior to admission, pt was living with daughter, son-in-law, and grandson in a 1-level home with 1 step to enter. At baseline, pt requires MIN assistance for dressing, sponge-bathing, and toilet hygiene. Pt does not walk at baseline, only transfers to/from [facility] and [facility]. Pt currently requires MAX-TOTAL A for bed mobility, SUPERVISION/SET-UP for seated UB ADLs while seated EOB, and MAX A for seated LB ADLs due to current functional impairments (See OT Problem List below). This date, attempted x2 sit>stand transfers to change linen, however pt unable to obtain upright stance following MAX A of 1-person. Pt would benefit from additional skilled OT services to maximize return to PLOF and minimize risk of future falls, injury, caregiver burden, and readmission. Upon discharge, recommend SNF.        Recommendations for follow up therapy are one component of a multi-disciplinary discharge planning process, led by the attending physician.  Recommendations may be updated based on patient status, additional functional criteria and insurance authorization.   Follow Up Recommendations  SNF    Equipment Recommendations  Other (comment) (defer to next venue of care)       Precautions / Restrictions Precautions Precautions:  Fall Restrictions Weight Bearing Restrictions: Yes RLE Weight Bearing: Weight bearing as tolerated      Mobility Bed Mobility Overal bed mobility: Needs Assistance Bed Mobility: Supine to Sit;Sit to Supine;Rolling Rolling: Max assist   Supine to sit: Max assist;HOB elevated Sit to supine: Total assist;+2 for safety/equipment   General bed mobility comments: with HOB elevated, pt requires MAX A for assisting trunk and LE for upright sitting. Once sitting upright, pt able to scoot hips towards EOB with increased time/effort. Total assist to return to supine and +2 to scoot toward [facility]    Transfers Overall transfer level: Needs assistance Equipment used: Rolling walker (2 wheeled) Transfers: Sit to/from Stand Sit to Stand: Max assist         General transfer comment: Pt unable to come to full standing 2/2 to pain    Balance Overall balance assessment: Needs assistance Sitting-balance support: Bilateral upper extremity supported;Feet supported Sitting balance-Leahy Scale: Fair Sitting balance - Comments: Able to maintain static sitting balance at EOB during seated ADLs   Standing balance support: Bilateral upper extremity supported;During functional activity Standing balance-Leahy Scale: Zero Standing balance comment: Unable to come to full standing with maximal assistance                           ADL either performed or assessed with clinical judgement   ADL Overall ADL's : Needs assistance/impaired     Grooming: Applying deodorant;Supervision/safety;Set up;Sitting   Upper Body Bathing: Supervision/ safety;Set up;Sitting       Upper Body Dressing : Supervision/safety;Set up;Sitting Upper Body Dressing Details (indicate cue type and reason): to don/doff hospital gown Lower Body Dressing: Maximal assistance;Sitting/lateral leans Lower Body Dressing Details (indicate cue  type and reason): to don/doff socks                   Pertinent Vitals/Pain  Pain Assessment: Faces Faces Pain Scale: Hurts even more Pain Location: R LE (with movement; none at rest) Pain Descriptors / Indicators: Aching;Grimacing Pain Intervention(s): Limited activity within patient's tolerance;Monitored during session;Repositioned;Ice applied        Extremity/Trunk Assessment Upper Extremity Assessment Upper Extremity Assessment: Generalized weakness   Lower Extremity Assessment Lower Extremity Assessment: Generalized weakness       Communication Communication Communication: No difficulties   Cognition Arousal/Alertness: Awake/alert Behavior During Therapy: WFL for tasks assessed/performed Overall Cognitive Status: Within Functional Limits for tasks assessed                                 General Comments: A&Ox4. Pleasant and agreeable throughout   General Comments  IV site appeared to be leaking; RN informed and present during session to address while pt sat EOB            Home Living Family/patient expects to be discharged to:: Private residence Living Arrangements: Children;Other relatives (grandson) Available Help at Discharge: Family;Available 24 hours/day Type of Home: House Home Access: Stairs to enter [employer] of Steps: 1 Entrance Stairs-Rails: Can reach both Home Layout: One level     Bathroom Shower/Tub: Other (comment) (spongebathes at baseline)         Home Equipment: Wheelchair - manual;Bedside commode   Additional Comments: Pt reports that her daughter, SIL, and grandson live with her and are available 24/7      Prior Functioning/Environment Level of Independence: Needs assistance  Gait / Transfers Assistance Needed: Requires assistance with stand pivot transfers to/from [facility] and [facility] ADL's / Homemaking Assistance Needed: Requires MIN assistance for dressing, sponge-bathing, and toilet hygiene.   Comments: Non-ambulatory at baseline. Transfers only to RW with assistance        OT  Problem List: Decreased strength;Decreased activity tolerance;Impaired balance (sitting and/or standing);Pain      OT Treatment/Interventions:      OT Goals(Current goals can be found in the care plan section) Acute Rehab OT Goals Patient Stated Goal: to get back home OT Goal Formulation: With patient Time For Goal Achievement: 04/02/21 Potential to Achieve Goals: Fair ADL Goals Pt Will Perform Lower Body Dressing: with modified independence;sitting/lateral leans Pt Will Transfer to Toilet: with mod assist;stand pivot transfer;bedside commode Pt Will Perform Toileting - Clothing Manipulation and hygiene: with mod assist;sit to/from stand  OT Frequency: Min 1X/week    AM-PAC OT "6 Clicks" Daily Activity     Outcome Measure Help from another person eating meals?: None Help from another person taking care of personal grooming?: A Little Help from another person toileting, which includes using toliet, bedpan, or urinal?: A Lot Help from another person bathing (including washing, rinsing, drying)?: A Lot Help from another person to put on and taking off regular upper body clothing?: A Little Help from another person to put on and taking off regular lower body clothing?: A Lot 6 Click Score: 16   End of Session Equipment Utilized During Treatment: Rolling walker Nurse Communication: Mobility status;Other (comment) (leaking at IV site)  Activity Tolerance: Patient tolerated treatment well Patient left: in bed;with call bell/phone within reach;with bed alarm set  OT Visit Diagnosis: Unsteadiness on feet (R26.81);Muscle weakness (generalized) (M62.81);History of falling (Z91.81);Pain Pain - Right/Left: Right Pain - part of body:  Hip                Time: 9381-0175 OT Time Calculation (min): 50 min Charges:  OT General Charges $OT Visit: 1 Visit OT Evaluation $OT Eval Moderate Complexity: 1 Mod OT Treatments $Self Care/Home Management : 8-22 mins $Therapeutic Activity: 8-22  mins  Matthew Folks, OTR/L [facility] [phone]

## 2021-03-19 NOTE — Evaluation (Signed)
Physical Therapy Evaluation Patient Details Name: Tracy Morales MRN: [id number] DOB: 09/20/1919 Today's Date: 03/19/2021  History of Present Illness  Tracy Morales is a 87 y.o. female with multiple medical problem, including hypertension, hyperlipidemia, peripheral vascular disease, chronic kidney disease, cerebrovascular disease, polymyalgia rheumatica, and mild depression who lives with her daughter who presented to the ED on 10/15 after a fall. Pt to underwent R reduction and internal fixation of displaced proximal femoral shaft/subtrochanteric right hip fracture with TFN nail with Dr. Joice Lofts 10/15.   Clinical Impression  Pt is a pleasant 87 year old female who presents POD#1 after R hip surgery listed above and currently is WBAT. At baseline, pt reports that she was completing stand pivot transfers to [facility] only with usage of RW + assistance from family members. Upon evaluation, pt requiring totalA for bed mobility, and maximal assistance with sit to stand with RW. Pt demonstrating decreased R LE strength and currently functioning below her baseline. Recommending SNF placement at discharge to improve overall mobility to reduce fall risk. Will continue to work with patient during acute hospitalization to promote mobility and improve R LE ROM.     Recommendations for follow up therapy are one component of a multi-disciplinary discharge planning process, led by the attending physician.  Recommendations may be updated based on patient status, additional functional criteria and insurance authorization.  Follow Up Recommendations SNF    Equipment Recommendations  None recommended by PT    Recommendations for Other Services OT consult     Precautions / Restrictions Precautions Precautions: Fall Restrictions Weight Bearing Restrictions: Yes RLE Weight Bearing: Weight bearing as tolerated      Mobility  Bed Mobility Overal bed mobility: Needs Assistance Bed Mobility: Supine to Sit;Sit to  Supine     Supine to sit: Total assist Sit to supine: Total assist   General bed mobility comments: Total A for movement of B LEs off EOB and with trunk to upright sitting. Cues for assistance with bed rails with limited follow through    Transfers Overall transfer level: Needs assistance Equipment used: Rolling walker (2 wheeled) Transfers: Sit to/from Stand Sit to Stand: Max assist         General transfer comment: MaxA for forward lean, and R knee block due to weakness. Pt unable to come to full standing 2/2 to pain  Ambulation/Gait             General Gait Details: Deferred at this time for safety  Stairs            Wheelchair Mobility    Modified Rankin (Stroke Patients Only)       Balance Overall balance assessment: Needs assistance Sitting-balance support: Bilateral upper extremity supported;Feet supported Sitting balance-Leahy Scale: Poor Sitting balance - Comments: Requiring B UE assistance to maintain sitting balance. No physical assistance required   Standing balance support: Bilateral upper extremity supported;During functional activity Standing balance-Leahy Scale: Zero Standing balance comment: Unable to come to full standing with maximal assistance                             Pertinent Vitals/Pain      Home Living Family/patient expects to be discharged to:: Private residence Living Arrangements: Children Available Help at Discharge: Family;Available 24 hours/day Type of Home: House Home Access: Stairs to enter Entrance Stairs-Rails: Can reach both Entrance Stairs-Number of Steps: 1 Home Layout: One level   Additional Comments: Pt reports that  her daughter, SIL, and grandson live with her and are available 24/7    Prior Function Level of Independence: Needs assistance   Gait / Transfers Assistance Needed: Assistance with stand pivot transfers to [facility]  ADL's / Homemaking Assistance Needed: Assistance with dressing and  bathin  Comments: Non-ambulatory at baseline. Transfers only to RW with assistance     Hand Dominance        Extremity/Trunk Assessment   Upper Extremity Assessment Upper Extremity Assessment: Defer to OT evaluation;Generalized weakness    Lower Extremity Assessment Lower Extremity Assessment: Generalized weakness;RLE deficits/detail;LLE deficits/detail RLE Deficits / Details: Limited R hip, knee, and ankle AROM. PROM limited secondary to pain. Grossly 2-/5 for R LE RLE: Unable to fully assess due to pain RLE Sensation: WNL LLE Deficits / Details: Grossly 3/5 LLE Sensation: WNL       Communication   Communication: No difficulties  Cognition Arousal/Alertness: Awake/alert Behavior During Therapy: WFL for tasks assessed/performed Overall Cognitive Status: No family/caregiver present to determine baseline cognitive functioning                                 General Comments: Pt A &O to self, situation, and place. Disoriented to time.      General Comments      Exercises Other Exercises Other Exercises: Supine therex: PROM R LE: Ankle pumps x 10, knee flexion x 10, hip flexion x 10. Pastor visited during evaluation and provided emotional and spiritual care.   Assessment/Plan    PT Assessment Patient needs continued PT services  PT Problem List Decreased strength;Decreased range of motion;Decreased activity tolerance;Decreased balance;Decreased mobility;Decreased coordination;Decreased cognition;Decreased knowledge of use of DME;Decreased safety awareness;Decreased knowledge of precautions       PT Treatment Interventions DME instruction;Gait training;Stair training;Functional mobility training;Therapeutic activities;Therapeutic exercise;Balance training;Neuromuscular re-education;Cognitive remediation;Patient/family education;Wheelchair mobility training;Modalities    PT Goals (Current goals can be found in the Care Plan section)  Acute Rehab PT  Goals Patient Stated Goal: to get stronger and to not give up PT Goal Formulation: With patient Time For Goal Achievement: 04/02/21 Potential to Achieve Goals: Fair    Frequency BID   Barriers to discharge        Co-evaluation               AM-PAC PT "6 Clicks" Mobility  Outcome Measure Help needed turning from your back to your side while in a flat bed without using bedrails?: A Lot Help needed moving from lying on your back to sitting on the side of a flat bed without using bedrails?: Total Help needed moving to and from a bed to a chair (including a wheelchair)?: Total Help needed standing up from a chair using your arms (e.g., wheelchair or bedside chair)?: Total Help needed to walk in hospital room?: Total Help needed climbing 3-5 steps with a railing? : Total 6 Click Score: 7    End of Session Equipment Utilized During Treatment: Gait belt Activity Tolerance: Patient limited by pain Patient left: in bed;with bed alarm set;with SCD's reapplied;with call bell/phone within reach Nurse Communication: Mobility status PT Visit Diagnosis: Unsteadiness on feet (R26.81);Repeated falls (R29.6);Muscle weakness (generalized) (M62.81)    Time: 0865-7846 PT Time Calculation (min) (ACUTE ONLY): 31 min   Charges:   PT Evaluation $PT Eval Low Complexity: 1 Low PT Treatments $Therapeutic Exercise: 8-22 mins        Verl Blalock, SPT   Verl Blalock 03/19/2021, 12:19  PM

## 2021-03-20 ENCOUNTER — Inpatient Hospital Stay: Payer: Medicare PPO

## 2021-03-20 ENCOUNTER — Encounter: Payer: Self-pay | Admitting: Surgery

## 2021-03-20 DIAGNOSIS — E440 Moderate protein-calorie malnutrition: Secondary | ICD-10-CM

## 2021-03-20 DIAGNOSIS — S72009A Fracture of unspecified part of neck of unspecified femur, initial encounter for closed fracture: Secondary | ICD-10-CM

## 2021-03-20 LAB — CBC
HCT: 27.6 % — ABNORMAL LOW (ref 36.0–46.0)
Hemoglobin: 9.3 g/dL — ABNORMAL LOW (ref 12.0–15.0)
MCH: 30 pg (ref 26.0–34.0)
MCHC: 33.7 g/dL (ref 30.0–36.0)
MCV: 89 fL (ref 80.0–100.0)
Platelets: 197 10*3/uL (ref 150–400)
RBC: 3.1 MIL/uL — ABNORMAL LOW (ref 3.87–5.11)
RDW: 15.1 % (ref 11.5–15.5)
WBC: 8.2 10*3/uL (ref 4.0–10.5)
nRBC: 0 % (ref 0.0–0.2)

## 2021-03-20 MED ORDER — ENOXAPARIN SODIUM 40 MG/0.4ML IJ SOSY: 40.0000 mg | PREFILLED_SYRINGE | INTRAMUSCULAR | 0 refills | Status: DC

## 2021-03-20 MED ORDER — SENNA 8.6 MG PO TABS
1.0000 | ORAL_TABLET | Freq: Every day | ORAL | Status: DC
  Administered 2021-03-20 – 2021-03-22 (×3): 8.6 mg via ORAL
  Filled 2021-03-20 (×3): qty 1

## 2021-03-20 MED ORDER — POLYETHYLENE GLYCOL 3350 17 G PO PACK
17.0000 g | PACK | Freq: Two times a day (BID) | ORAL | Status: DC
  Administered 2021-03-20 – 2021-03-21 (×3): 17 g via ORAL
  Filled 2021-03-20 (×5): qty 1

## 2021-03-20 MED ORDER — ENSURE ENLIVE PO LIQD
237.0000 mL | Freq: Two times a day (BID) | ORAL | Status: DC
  Administered 2021-03-20 – 2021-03-23 (×6): 237 mL via ORAL

## 2021-03-20 MED ORDER — HYDROCODONE-ACETAMINOPHEN 5-325 MG PO TABS: 0.5000 | ORAL_TABLET | Freq: Four times a day (QID) | ORAL | 0 refills | Status: DC | PRN

## 2021-03-20 NOTE — NC FL2 (Signed)
[place] MEDICAID FL2 LEVEL OF CARE SCREENING TOOL     IDENTIFICATION  Patient Name: Tracy Morales Birthdate: 1920-05-25 Sex: female Admission Date (Current Location): 03/18/2021  [facility] and [place] Number:  Chiropodist and Address:  [facility], [address] [place], [place] [address]      Provider Number: [id number]  Attending Physician Name and Address:  Standley Brooking, MD  Relative Name and Phone Number:  Domenica Reamer [facility])   [phone] [facility] Daughter [phone]   [phone]    Current Level of Care: Hospital Recommended Level of Care: Skilled Nursing Facility Prior Approval Number:    Date Approved/Denied:   PASRR Number: Pending  Discharge Plan: SNF    Current Diagnoses: Patient Active Problem List   Diagnosis Date Noted   Malnutrition of moderate degree 03/20/2021   ABLA 03/19/2021   Hip fracture (HCC) 03/18/2021   DNR (do not resuscitate) 03/18/2021   Closed displaced transverse fracture of shaft of right femur (HCC)    Fall    COVID-19 01/19/2021   Pincer nail deformity 07/04/2020   Phlebitis 05/20/2020   Left leg pain 02/19/2020   Ankle sprain 01/15/2020   Seborrheic dermatitis of scalp 08/27/2019   Elevated random blood glucose level 05/11/2019   Pain due to onychomycosis of toenails of both feet 12/04/2018   Recurrent UTI 09/22/2018   Low back pain 09/15/2018   MCI (mild cognitive impairment) 05/11/2018   Frequent urination 02/28/2018   Mobility impaired 05/06/2017   Osteoarthritis of both hips 04/15/2015   Hip pain 04/14/2015   Encounter for general adult medical examination with abnormal findings 04/12/2015   Hair loss 11/19/2014   Loss of weight 06/09/2014   Arthritis of right shoulder region 06/09/2014   Nodule of left lung 06/09/2014   Encounter for Medicare annual wellness exam 10/30/2013   H/O fall 04/27/2013   Risk for falls 04/11/2012   Leg pain, bilateral  01/29/2012   MICROSCOPIC HEMATURIA 08/29/2009   CONDUCTIVE HEARING LOSS BILATERAL 08/08/2009   EDEMA 04/07/2009   BRADYCARDIA 10/26/2008   DYSPNEA 10/26/2008   INCONTINENCE, URGE 02/27/2008   Hyperlipidemia 10/11/2006   PERIPHERAL NEUROPATHY, LOWER EXTREMITIES, BILATERAL 10/11/2006   Essential hypertension 10/11/2006   DIASTOLIC DYSFUNCTION 10/11/2006   CAROTID STENOSIS 10/11/2006   CEREBROVASCULAR DISEASE 10/11/2006   PERIPHERAL VASCULAR DISEASE 10/11/2006   ALLERGIC RHINITIS 10/11/2006   CKD stage IIIb (HCC) 10/11/2006   OVERACTIVE BLADDER 10/11/2006   OSTEOARTHRITIS 10/11/2006   CARDIAC MURMUR, HX OF 10/11/2006    Orientation RESPIRATION BLADDER Height & Weight     Self, Time, Situation, Place  Normal Incontinent Weight: 56 kg Height:  5\' 4"  (162.6 cm)  BEHAVIORAL SYMPTOMS/MOOD NEUROLOGICAL BOWEL NUTRITION STATUS       (consitpation) Diet (DYS3 diet  for easier chewing at meals, Ensure Enlive po BID,Magic cup TID with meals,)  AMBULATORY STATUS COMMUNICATION OF NEEDS Skin   Extensive Assist Verbally Surgical wounds (R hip with honeycomb dressing, and ecchymosis to site)                       Personal Care Assistance Level of Assistance  Bathing, Feeding, Dressing Bathing Assistance: Maximum assistance Feeding assistance: Maximum assistance Dressing Assistance: Maximum assistance     Functional Limitations Info  Sight, Hearing, Speech Sight Info: Impaired Hearing Info: Impaired Speech Info: Adequate    SPECIAL CARE FACTORS FREQUENCY  PT (By licensed PT), OT (By licensed OT)     PT Frequency: 5x  weekly OT Frequency: 5x weekly            Contractures Contractures Info: Not present    Additional Factors Info  Code Status, Allergies Code Status Info: DNR Allergies Info: Ace Inhibitors, Donazepril Sulfa antibiotics           Current Medications (03/20/2021):  This is the current hospital active medication list Current Facility-Administered  Medications  Medication Dose Route Frequency Provider Last Rate Last Admin   acetaminophen (TYLENOL) tablet 325-650 mg  325-650 mg Oral Q6H PRN Poggi, Excell Seltzer, MD       amLODipine (NORVASC) tablet 5 mg  5 mg Oral Daily Poggi, Excell Seltzer, MD   5 mg at 03/20/21 3734   bisacodyl (DULCOLAX) suppository 10 mg  10 mg Rectal Daily PRN Christena Flake, MD   10 mg at 03/20/21 2876   Chlorhexidine Gluconate Cloth 2 % PADS 6 each  6 each Topical Daily Standley Brooking, MD   6 each at 03/20/21 8115   diphenhydrAMINE (BENADRYL) 12.5 MG/5ML elixir 12.5-25 mg  12.5-25 mg Oral Q4H PRN Poggi, Excell Seltzer, MD       docusate sodium (COLACE) capsule 100 mg  100 mg Oral BID Christena Flake, MD   100 mg at 03/20/21 0938   enoxaparin (LOVENOX) injection 30 mg  30 mg Subcutaneous Q24H Poggi, Excell Seltzer, MD   30 mg at 03/20/21 7262   feeding supplement (ENSURE ENLIVE / ENSURE PLUS) liquid 237 mL  237 mL Oral BID BM Standley Brooking, MD   237 mL at 03/20/21 1336   HYDROcodone-acetaminophen (NORCO/VICODIN) 5-325 MG per tablet 1-2 tablet  1-2 tablet Oral Q6H PRN Poggi, Excell Seltzer, MD   2 tablet at 03/20/21 0355   magnesium hydroxide (MILK OF MAGNESIA) suspension 30 mL  30 mL Oral Daily PRN Poggi, Excell Seltzer, MD       methocarbamol (ROBAXIN) tablet 500 mg  500 mg Oral Q6H PRN Poggi, Excell Seltzer, MD       Or   methocarbamol (ROBAXIN) 500 mg in dextrose 5 % 50 mL IVPB  500 mg Intravenous Q6H PRN Poggi, Excell Seltzer, MD       mirtazapine (REMERON) tablet 30 mg  30 mg Oral QHS Poggi, Excell Seltzer, MD   30 mg at 03/19/21 2042   morphine 2 MG/ML injection 1-2 mg  1-2 mg Intravenous Q2H PRN Poggi, Excell Seltzer, MD       ondansetron [facility]) tablet 4 mg  4 mg Oral Q6H PRN Poggi, Excell Seltzer, MD       Or   ondansetron (ZOFRAN) injection 4 mg  4 mg Intravenous Q6H PRN Poggi, Excell Seltzer, MD       polyethylene glycol (MIRALAX / GLYCOLAX) packet 17 g  17 g Oral BID Standley Brooking, MD   17 g at 03/20/21 9741   senna (SENOKOT) tablet 8.6 mg  1 tablet Oral QHS Standley Brooking, MD        simvastatin (ZOCOR) tablet 20 mg  20 mg Oral Daily Poggi, Excell Seltzer, MD   20 mg at 03/20/21 6384   sodium phosphate (FLEET) 7-19 GM/118ML enema 1 enema  1 enema Rectal Once PRN Poggi, Excell Seltzer, MD         Discharge Medications: Please see discharge summary for a list of discharge medications.  Relevant Imaging Results:  Relevant Lab Results:   Additional Information SSN [id number]  Caryn Section, RN

## 2021-03-20 NOTE — Progress Notes (Signed)
  Progress Note Tracy Morales   TDV:[id number]  DOB: 10/25/1919  DOA: 03/18/2021     2 Date of Service: 03/20/2021   Clinical Course 87 year old woman presented to the emergency department after mechanical fall at home resulting in right hip pain. --10/15 Admitted for right hip fracture, status post reduction and internal fixation of displaced proximal femoral shaft/subtrochanteric right hip fracture  -- 10/16 postop day 1, no complaints, anemia noted will follow, up with therapy today -- 10/17 no new issues, planning for SNF  Assessment and Plan * Hip fracture (HCC) -- Status post mechanical fall.  Status post surgery 10/15. -- Overall doing well, continue management per orthopedics, anticipate SNF  ABLA -- Perioperative in nature.  Asymptomatic.  Stable.  CKD stage IIIb (HCC) -- Appears to be at baseline  Malnutrition of moderate degree --DYS3 diet to allow for easier chewing at meals\ --Ensure Enlive po BID, each supplement provides 350 kcal and 20 grams of protein Magic cup TID with meals, each supplement provides 290 kcal and 9 grams of protein  Essential hypertension -- Stable, continue amlodipine  Hyperlipidemia -- Stable.  Continue simvastatin  Subjective:  Feels okay  Objective Vitals:   03/20/21 0804 03/20/21 0939 03/20/21 1137 03/20/21 1315  BP: (!) 122/58  (!) 98/54 98/62  Pulse: 78  80   Resp: 15  15   Temp: 99 F (37.2 C)  98 F (36.7 C)   TempSrc:      SpO2: 100%  100%   Weight:      Height:  5\' 4"  (1.626 m)     56 kg  Vital signs were reviewed and unremarkable. Blood pressure slightly soft  Exam Physical Exam Constitutional:      General: She is not in acute distress. Cardiovascular:     Rate and Rhythm: Normal rate and regular rhythm.     Heart sounds: No murmur heard. Pulmonary:     Effort: Pulmonary effort is normal. No respiratory distress.     Breath sounds: No wheezing or rales.  Neurological:     Mental Status: She is alert.   Psychiatric:        Mood and Affect: Mood normal.        Behavior: Behavior normal.    Labs / Other Information My review of labs, imaging, notes and other tests is significant for    hemoglobin stable 9.3  Disposition Plan: Status is: Inpatient  Remains inpatient appropriate because: Status post hip fracture repair, needs SNF  DNR enoxaparin  Time spent: 20 minutes [facility] 03/20/2021, 3:02 PM

## 2021-03-20 NOTE — Progress Notes (Signed)
  Subjective: 2 Days Post-Op Procedure(s) (LRB): INTRAMEDULLARY (IM) NAIL FEMORAL (Right) Patient reports pain as mild this morning. Patient is well, and has had no acute complaints or problems Negative for chest pain and shortness of breath Fever: no Gastrointestinal: negative for nausea and vomiting.  She has not had a BM yet.  Objective: Vital signs in last 24 hours: Temp:  [97.5 F (36.4 C)-98.5 F (36.9 C)] 97.5 F (36.4 C) (10/17 0450) Pulse Rate:  [74-89] 79 (10/17 0450) Resp:  [15-20] 17 (10/17 0450) BP: (105-129)/(55-75) 105/55 (10/17 0450) SpO2:  [97 %-100 %] 98 % (10/17 0450)  Intake/Output from previous day:  Intake/Output Summary (Last 24 hours) at 03/20/2021 0751 Last data filed at 03/20/2021 0441 Gross per 24 hour  Intake --  Output 1050 ml  Net -1050 ml    Intake/Output this shift: No intake/output data recorded.  Labs: Recent Labs    03/18/21 0923 03/19/21 0426 03/20/21 0419  HGB 12.1 8.7* 9.3*   Recent Labs    03/19/21 0426 03/20/21 0419  WBC 6.1 8.2  RBC 2.91* 3.10*  HCT 26.0* 27.6*  PLT 197 197   Recent Labs    03/18/21 0923 03/19/21 0426  NA 137 136  K 4.1 4.5  CL 102 104  CO2 24 26  BUN 30* 28*  CREATININE 1.49* 1.52*  GLUCOSE 130* 106*  CALCIUM 8.8* 8.1*   Recent Labs    03/18/21 0923  INR 1.0     EXAM General - Patient is Alert, Appropriate, and Oriented Extremity - Neurovascular intact Dorsiflexion/Plantar flexion intact Compartment soft Dressing/Incision -clean, dry, no drainage noted to the right leg. Motor Function - intact, moving foot and toes well on exam.  Abdomen soft to palpation however decreased bowel sounds noted.    Assessment/Plan: 2 Days Post-Op Procedure(s) (LRB): INTRAMEDULLARY (IM) NAIL FEMORAL (Right) Principal Problem:   Hip fracture (HCC) Active Problems:   Hyperlipidemia   Essential hypertension   CKD stage IIIb (HCC)   DNR (do not resuscitate)   ABLA  Estimated body mass index  is 21.19 kg/m as calculated from the following:   Height as of 02/19/20: 5\' 4"  (1.626 m).   Weight as of this encounter: 56 kg. Advance diet Up with therapy  Labs reviewed this AM.  Hg 9.3. Patient without BM at this time.  Decreased bowel sounds noted.  KUB ordered.  Move to FLEET enema today if needed. Care management to assist with discharge planning.  Current recommendation is for SNF.  DVT Prophylaxis - Lovenox and SCDs Weight-Bearing as tolerated to right leg  J. , PA-C [facility] 03/20/2021, 7:51 AM

## 2021-03-20 NOTE — TOC Initial Note (Signed)
Transition of Care [facility]) - Initial/Assessment Note    Patient Details  Name: Tracy Morales MRN: [id number] Date of Birth: 1920/03/02  Transition of Care [facility]) CM/SW Contact:    Caryn Section, RN Phone Number: 03/20/2021, 4:12 PM  Clinical Narrative:   Patient lives at home with daughter and grandson.  She typically uses a wheelchair at home and family is able to transport her to appointments.    Current discharge recommendation is for patient to go to SNF for rehab.  Patient, daughter and grandson agree to SNF at this time.  Bed search started in [place].                Expected Discharge Plan: Skilled Nursing Facility Barriers to Discharge: Continued Medical Work up   Patient Goals and CMS Choice        Expected Discharge Plan and Services Expected Discharge Plan: Skilled Nursing Facility   Discharge Planning Services: CM Consult Post Acute Care Choice: Skilled Nursing Facility Living arrangements for the past 2 months: Single Family Home                 DME Arranged:  (none at this time)         HH Arranged:  (SNF)          Prior Living Arrangements/Services Living arrangements for the past 2 months: Single Family Home Lives with:: Self, Adult Children Patient language and need for interpreter reviewed:: Yes (no interpreter required) Do you feel safe going back to the place where you live?: Yes      Need for Family Participation in Patient Care: Yes (Comment) Care giver support system in place?: Yes (comment) Current home services:  (No current service) Criminal Activity/Legal Involvement Pertinent to Current Situation/Hospitalization: No - Comment as needed  Activities of Daily Living Home Assistive Devices/Equipment: Wheelchair ADL Screening (condition at time of admission) Patient's cognitive ability adequate to safely complete daily activities?: Yes Is the patient deaf or have difficulty hearing?: Yes Does the patient have difficulty seeing, even  when wearing glasses/contacts?: No Does the patient have difficulty concentrating, remembering, or making decisions?: Yes Patient able to express need for assistance with ADLs?: Yes Does the patient have difficulty dressing or bathing?: Yes Independently performs ADLs?: No Communication: Appropriate for developmental age Dressing (OT): Needs assistance Is this a change from baseline?: Pre-admission baseline Grooming: Needs assistance Is this a change from baseline?: Pre-admission baseline Feeding: Independent with device (comment) Bathing: Needs assistance Is this a change from baseline?: Pre-admission baseline Toileting: Needs assistance Is this a change from baseline?: Pre-admission baseline In/Out Bed: Needs assistance Is this a change from baseline?: Pre-admission baseline Walks in Home: Needs assistance Is this a change from baseline?: Pre-admission baseline Does the patient have difficulty walking or climbing stairs?: Yes Weakness of Legs: Both Weakness of Arms/Hands: Both  Permission Sought/Granted Permission sought to share information with : Case Manager Permission granted to share information with : Yes, Verbal Permission Granted     Permission granted to share info w AGENCY: SKilled nursing        Emotional Assessment Appearance:: Appears stated age Attitude/Demeanor/Rapport: Engaged Affect (typically observed): Pleasant Orientation: : Oriented to Self Alcohol / Substance Use: Illicit Drugs Psych Involvement: No (comment)  Admission diagnosis:  Hip fracture (HCC) [S72.009A] Surgery, elective [Z41.9] Closed displaced transverse fracture of shaft of right femur, initial encounter (HCC) [S72.321A] Fall, initial encounter [W19.XXXA] Patient Active Problem List   Diagnosis Date Noted   Malnutrition of moderate degree  03/20/2021   ABLA 03/19/2021   Hip fracture (HCC) 03/18/2021   DNR (do not resuscitate) 03/18/2021   Closed displaced transverse fracture of shaft  of right femur (HCC)    Fall    COVID-19 01/19/2021   Pincer nail deformity 07/04/2020   Phlebitis 05/20/2020   Left leg pain 02/19/2020   Ankle sprain 01/15/2020   Seborrheic dermatitis of scalp 08/27/2019   Elevated random blood glucose level 05/11/2019   Pain due to onychomycosis of toenails of both feet 12/04/2018   Recurrent UTI 09/22/2018   Low back pain 09/15/2018   MCI (mild cognitive impairment) 05/11/2018   Frequent urination 02/28/2018   Mobility impaired 05/06/2017   Osteoarthritis of both hips 04/15/2015   Hip pain 04/14/2015   Encounter for general adult medical examination with abnormal findings 04/12/2015   Hair loss 11/19/2014   Loss of weight 06/09/2014   Arthritis of right shoulder region 06/09/2014   Nodule of left lung 06/09/2014   Encounter for Medicare annual wellness exam 10/30/2013   H/O fall 04/27/2013   Risk for falls 04/11/2012   Leg pain, bilateral 01/29/2012   MICROSCOPIC HEMATURIA 08/29/2009   CONDUCTIVE HEARING LOSS BILATERAL 08/08/2009   EDEMA 04/07/2009   BRADYCARDIA 10/26/2008   DYSPNEA 10/26/2008   INCONTINENCE, URGE 02/27/2008   Hyperlipidemia 10/11/2006   PERIPHERAL NEUROPATHY, LOWER EXTREMITIES, BILATERAL 10/11/2006   Essential hypertension 10/11/2006   DIASTOLIC DYSFUNCTION 10/11/2006   CAROTID STENOSIS 10/11/2006   CEREBROVASCULAR DISEASE 10/11/2006   PERIPHERAL VASCULAR DISEASE 10/11/2006   ALLERGIC RHINITIS 10/11/2006   CKD stage IIIb (HCC) 10/11/2006   OVERACTIVE BLADDER 10/11/2006   OSTEOARTHRITIS 10/11/2006   CARDIAC MURMUR, HX OF 10/11/2006   PCP:  Judy Pimple, MD Pharmacy:   [facility] [phone] - [place], [place] - [address] [place] [address] Phone: [phone] Fax: [phone]     Social Determinants of Health (SDOH) Interventions    Readmission Risk Interventions No flowsheet data found.

## 2021-03-20 NOTE — Assessment & Plan Note (Addendum)
--  DYS3 diet to allow for easier chewing at meals --Ensure Enlive po BID, each supplement provides 350 kcal and 20 grams of protein --Magic cup TID with meals, each supplement provides 290 kcal and 9 grams of protein

## 2021-03-20 NOTE — Progress Notes (Signed)
Physical Therapy Treatment Patient Details Name: Tracy Morales MRN: [id number] DOB: Mar 08, 1920 Today's Date: 03/20/2021   History of Present Illness Tracy Morales is a 87 y.o. female with multiple medical problem, including hypertension, hyperlipidemia, peripheral vascular disease, chronic kidney disease, cerebrovascular disease, polymyalgia rheumatica, and mild depression who lives with her daughter who presented to the ED on 10/15 after a fall. Pt to underwent R reduction and internal fixation of displaced proximal femoral shaft/subtrochanteric right hip fracture with TFN nail with Dr. Joice Lofts 10/15.    PT Comments    Pt continuing to make progress towards her goals however, continues to be limited to pain with PROM of the R hip. Pt educated on pain science and the benefits of movement in her hip. Pt able to stand x 3 with maxA + 2 for safety and RW. Pt demonstrating poor standing balance and tolerance. Recommending SNF at discharge and family + patient agreeable at this time. Will continue to work with patient to progress mobility and increase functional strength.    Recommendations for follow up therapy are one component of a multi-disciplinary discharge planning process, led by the attending physician.  Recommendations may be updated based on patient status, additional functional criteria and insurance authorization.  Follow Up Recommendations  SNF     Equipment Recommendations  None recommended by PT    Recommendations for Other Services       Precautions / Restrictions Precautions Precautions: Fall Restrictions Weight Bearing Restrictions: Yes RLE Weight Bearing: Weight bearing as tolerated     Mobility  Bed Mobility Overal bed mobility: Needs Assistance Bed Mobility: Supine to Sit;Sit to Supine     Supine to sit: Total assist;+2 for physical assistance Sit to supine: Total assist;+2 for physical assistance   General bed mobility comments: HOB slightly elevated. +2  for management of trunk and LEs to seated position. Able to assist some with cues for scooting towards EOB    Transfers Overall transfer level: Needs assistance Equipment used: Rolling walker (2 wheeled) Transfers: Sit to/from Stand Sit to Stand: Max assist;+2 physical assistance;From elevated surface         General transfer comment: R knee block for standing - still unable to fully extend B LEs to complete transfer safely  Ambulation/Gait             General Gait Details: Deferred at this time for safety   Stairs             Wheelchair Mobility    Modified Rankin (Stroke Patients Only)       Balance Overall balance assessment: Needs assistance Sitting-balance support: Feet supported;No upper extremity supported Sitting balance-Leahy Scale: Fair     Standing balance support: Bilateral upper extremity supported;During functional activity Standing balance-Leahy Scale: Zero Standing balance comment: Unable to come to full standing with maximal assistance + 2                            Cognition Arousal/Alertness: Awake/alert Behavior During Therapy: WFL for tasks assessed/performed Overall Cognitive Status: Within Functional Limits for tasks assessed                                 General Comments: A&Ox4. Pleasant and agreeable throughout      Exercises Other Exercises Other Exercises: Supine AROM: Ankle pumps and circles x 20 each, quad sets 1 x 10. Supine PROM:  Knee flexion + hip flexion 2 x 10. Seated therex: LAQ with assistance x 5. Pt demonstrating poor quadriceps activation in supine and sitting with maximal verbal and tactile cueing. Decreased active and passive ROM due to increased pain. Heavy pt education on pain science to reduce anxiety regarding pain in R LE and encourage movement of B LEs.    General Comments        Pertinent Vitals/Pain Pain Assessment: Faces Faces Pain Scale: Hurts whole lot Pain Location: R  LE (with movement; none at rest) Pain Descriptors / Indicators: Grimacing;Guarding Pain Intervention(s): Limited activity within patient's tolerance;Monitored during session;Premedicated before session;Relaxation    Home Living                      Prior Function            PT Goals (current goals can now be found in the care plan section) Acute Rehab PT Goals Patient Stated Goal: to get back home PT Goal Formulation: With patient/family Time For Goal Achievement: 04/02/21 Potential to Achieve Goals: Fair Progress towards PT goals: Progressing toward goals    Frequency    BID      PT Plan Current plan remains appropriate    Co-evaluation              AM-PAC PT "6 Clicks" Mobility   Outcome Measure  Help needed turning from your back to your side while in a flat bed without using bedrails?: A Lot Help needed moving from lying on your back to sitting on the side of a flat bed without using bedrails?: Total Help needed moving to and from a bed to a chair (including a wheelchair)?: Total Help needed standing up from a chair using your arms (e.g., wheelchair or bedside chair)?: Total Help needed to walk in hospital room?: Total Help needed climbing 3-5 steps with a railing? : Total 6 Click Score: 7    End of Session Equipment Utilized During Treatment: Gait belt Activity Tolerance: Patient limited by pain Patient left: in bed;with bed alarm set;with family/visitor present;with SCD's reapplied Nurse Communication: Mobility status PT Visit Diagnosis: Unsteadiness on feet (R26.81);Repeated falls (R29.6);Muscle weakness (generalized) (M62.81)     Time: 2458-0998 PT Time Calculation (min) (ACUTE ONLY): 24 min  Charges:  $Therapeutic Exercise: 8-22 mins $Therapeutic Activity: 8-22 mins                     Verl Blalock, SPT    Verl Blalock 03/20/2021, 1:10 PM

## 2021-03-20 NOTE — Progress Notes (Signed)
Initial Nutrition Assessment  DOCUMENTATION CODES:  Non-severe (moderate) malnutrition in context of social or environmental circumstances  INTERVENTION:  Change pt to DYS3 diet to allow for easier chewing at meals Ensure Enlive po BID, each supplement provides 350 kcal and 20 grams of protein Magic cup TID with meals, each supplement provides 290 kcal and 9 grams of protein  NUTRITION DIAGNOSIS:  Moderate Malnutrition related to social / environmental circumstances (inadequate oral intake) as evidenced by mild fat depletion, mild muscle depletion, moderate muscle depletion.  GOAL:  Patient will meet greater than or equal to 90% of their needs  MONITOR:  PO intake, Supplement acceptance, Skin  REASON FOR ASSESSMENT:  Consult Hip fracture protocol  ASSESSMENT:  87 y.o. female with hx of HTN, CKD, HLD, osteoarthritis, and PVD, presented to ED from home with hip pain after a fall.  Imaging in ED showed a hip fx. Orthopedics consulted and took for surgical repair 10/15.  Pt resting in the bed at the time of visit, sleeping soundly. Pt briefly woke during physical exam, but did not answer any nutrition questions. Noted that lunch tray at bedside had recently arrived. Did not respond when asked if she would like her meal set up.   Some muscle and fat deficits noted on exam. Pt also had limited teeth. Will adjust diet to DYS 3 to ensure foods that are provided are easy to chew. Will also add nutrition supplements to encourage adequate nutrition.  Average Meal Intake: 10/16-10/17: 50% intake x 2 recorded meals  Nutritionally Relevant Medications: Scheduled Meds:  docusate sodium  100 mg Oral BID   mirtazapine  30 mg Oral QHS   polyethylene glycol  17 g Oral BID   senna  1 tablet Oral QHS   simvastatin  20 mg Oral Daily   Continuous Infusions:  methocarbamol (ROBAXIN) IV     PRN Meds: bisacodyl, diphenhydrAMINE, magnesium hydroxide, ondansetron, sodium phosphate  Labs  Reviewed: BUN 28, creatinine 1.52  NUTRITION - FOCUSED PHYSICAL EXAM: Flowsheet Row Most Recent Value  Orbital Region Moderate depletion  Upper Arm Region No depletion  Thoracic and Lumbar Region No depletion  Buccal Region Mild depletion  Temple Region Moderate depletion  Clavicle Bone Region Mild depletion  Clavicle and Acromion Bone Region Moderate depletion  Scapular Bone Region Moderate depletion  Dorsal Hand Mild depletion  Patellar Region Mild depletion  Anterior Thigh Region Mild depletion  Posterior Calf Region Moderate depletion  Edema (RD Assessment) Mild  Hair Reviewed  Eyes Reviewed  Mouth Reviewed  [lack of dentition]  Skin Reviewed  Nails Reviewed   Diet Order:   Diet Order             DIET DYS 3 Room service appropriate? Yes; Fluid consistency: Thin  Diet effective now                   EDUCATION NEEDS:  No education needs have been identified at this time  Skin:  Skin Assessment: Reviewed RN Assessment (surgical incisions, right hip)  Last BM:  10/13  Height:  Ht Readings from Last 1 Encounters:  03/20/21 5\' 4"  (1.626 m)    Weight:  Wt Readings from Last 1 Encounters:  03/18/21 56 kg    Ideal Body Weight:  54.5 kg  BMI:  Body mass index is 21.19 kg/m.  Estimated Nutritional Needs:  Kcal:  1400-1600 kcal/d Protein:  70-80 g/d Fluid:  1.5-1.8L/d   03/20/21, RD, LDN Clinical Dietitian RD pager # available in  [facility]  After hours/weekend pager # available in [facility]

## 2021-03-20 NOTE — Progress Notes (Signed)
Physical Therapy Treatment Patient Details Name: Tracy Morales MRN: [id number] DOB: 08-22-1919 Today's Date: 03/20/2021   History of Present Illness Tracy Morales is a 87 y.o. female with multiple medical problem, including hypertension, hyperlipidemia, peripheral vascular disease, chronic kidney disease, cerebrovascular disease, polymyalgia rheumatica, and mild depression who lives with her daughter who presented to the ED on 10/15 after a fall. Pt to underwent R reduction and internal fixation of displaced proximal femoral shaft/subtrochanteric right hip fracture with TFN nail with Dr. Joice Lofts 10/15.    PT Comments    Pt received laying supine in bed and demonstrating some increased fatigue this afternoon. Pt able to participate some in bed level exercises however, PROM was performed to the R LE. Pt continues to be limited to increased pain with movement and will continue to coordinate with RN staff for pain management. Continuing to recommend SNF at discharge to improve mobility and strength.     Recommendations for follow up therapy are one component of a multi-disciplinary discharge planning process, led by the attending physician.  Recommendations may be updated based on patient status, additional functional criteria and insurance authorization.  Follow Up Recommendations  SNF     Equipment Recommendations  None recommended by PT    Recommendations for Other Services       Precautions / Restrictions Precautions Precautions: Fall Restrictions Weight Bearing Restrictions: Yes RLE Weight Bearing: Weight bearing as tolerated     Mobility  Bed Mobility               General bed mobility comments: Deferred due to patient fatigue    Transfers                 General transfer comment: Deferred due to patient fatigue  Ambulation/Gait             General Gait Details: Deferred at this time for safety   Stairs             Wheelchair Mobility     Modified Rankin (Stroke Patients Only)       Balance Overall balance assessment: Needs assistance             Cognition Arousal/Alertness: Awake/alert Behavior During Therapy: WFL for tasks assessed/performed Overall Cognitive Status: Within Functional Limits for tasks assessed               Exercises Total Joint Exercises Ankle Circles/Pumps: AROM;Both;20 reps;Supine Quad Sets: AROM;Strengthening;Both;20 reps;Supine Gluteal Sets: Strengthening;Both;10 reps;Supine Heel Slides: PROM;Right;AROM;Left;20 reps;Supine Hip ABduction/ADduction: PROM;Right;20 reps;Supine Straight Leg Raises: PROM;Right;10 reps;Supine    General Comments        Pertinent Vitals/Pain Pain Assessment: Faces Faces Pain Scale: Hurts even more Pain Location: R LE (with movement; none at rest) Pain Descriptors / Indicators: Grimacing;Guarding Pain Intervention(s): Limited activity within patient's tolerance;Monitored during session;Relaxation    Home Living                      Prior Function            PT Goals (current goals can now be found in the care plan section) Acute Rehab PT Goals Patient Stated Goal: to get back home PT Goal Formulation: With patient/family Time For Goal Achievement: 04/02/21 Potential to Achieve Goals: Fair Progress towards PT goals: Progressing toward goals    Frequency    BID      PT Plan Current plan remains appropriate    Co-evaluation  AM-PAC PT "6 Clicks" Mobility   Outcome Measure  Help needed turning from your back to your side while in a flat bed without using bedrails?: A Lot Help needed moving from lying on your back to sitting on the side of a flat bed without using bedrails?: Total Help needed moving to and from a bed to a chair (including a wheelchair)?: Total Help needed standing up from a chair using your arms (e.g., wheelchair or bedside chair)?: A Lot Help needed to walk in hospital room?:  Total Help needed climbing 3-5 steps with a railing? : Total 6 Click Score: 8    End of Session   Activity Tolerance: Patient limited by pain Patient left: in bed;with call bell/phone within reach;with bed alarm set;with SCD's reapplied Nurse Communication: Mobility status PT Visit Diagnosis: Unsteadiness on feet (R26.81);Repeated falls (R29.6);Muscle weakness (generalized) (M62.81)     Time: 7711-6579 PT Time Calculation (min) (ACUTE ONLY): 16 min  Charges:  $Therapeutic Exercise: 8-22 mins                     Verl Blalock, SPT    Verl Blalock 03/20/2021, 4:27 PM

## 2021-03-21 NOTE — TOC Progression Note (Signed)
Transition of Care [facility]) - Progression Note    Patient Details  Name: Tracy Morales MRN: [id number] Date of Birth: 08-01-1919  [facility] CM/SW Contact  Caryn Section, RN Phone Number: 03/21/2021, 8:55 AM  Clinical Narrative:  Daughter notified that bed offers at this time are [facility].  Daughter will take time to research facilities and let me know when she arrives this afternoon.  TOC contact information provided.     Expected Discharge Plan: Skilled Nursing Facility Barriers to Discharge: Continued Medical Work up  Expected Discharge Plan and Services Expected Discharge Plan: Skilled Nursing Facility   Discharge Planning Services: CM Consult Post Acute Care Choice: Skilled Nursing Facility Living arrangements for the past 2 months: Single Family Home                 DME Arranged:  (none at this time)         HH Arranged:  (SNF)           Social Determinants of Health (SDOH) Interventions    Readmission Risk Interventions No flowsheet data found.

## 2021-03-21 NOTE — Progress Notes (Signed)
Occupational Therapy Treatment Patient Details Name: Tracy Morales MRN: [id number] DOB: [birth year] Today's Date: 03/21/2021   History of present illness Tracy Morales is a 87 y.o. female with multiple medical problem, including hypertension, hyperlipidemia, peripheral vascular disease, chronic kidney disease, cerebrovascular disease, polymyalgia rheumatica, and mild depression who lives with her daughter who presented to the ED on 10/15 after a fall. Pt to underwent R reduction and internal fixation of displaced proximal femoral shaft/subtrochanteric right hip fracture with TFN nail with Dr. Joice Lofts 10/15.   OT comments  Pt seen for OT treatment on this date. Upon arrival to room, pt awake and seated upright in bed. Pt agreeable to OT tx. Pt currently presents with LLE pain (with movement), decreased strength, and decreased balance. This date, pt required MAX A for supine>sit transfer and SUPERVISION/SET-UP for seated grooming tasks at EOB. While seated EOB, pt was noted to be incontinent of stool and required MAX A+2 for sit>stand peri-care d/t pain and decreased strength/balance. During x3 sit>stand bouts, pt unable to to come to upright standing following MAX A+2. Pt returned to supine with MAX A+2 and rolled with MAX A for linen change and to ensure thoroughness of peri-care. Pt left in bed, in no acute distress, with family present. Pt continues to benefit from skilled OT services to maximize return to PLOF and minimize risk of future falls, injury, caregiver burden, and readmission. Will continue to follow POC. Discharge recommendation remains appropriate.     Recommendations for follow up therapy are one component of a multi-disciplinary discharge planning process, led by the attending physician.  Recommendations may be updated based on patient status, additional functional criteria and insurance authorization.    Follow Up Recommendations  SNF    Equipment Recommendations  Other (comment)  (defer to next venue of care)       Precautions / Restrictions Precautions Precautions: Fall Restrictions Weight Bearing Restrictions: Yes RLE Weight Bearing: Weight bearing as tolerated       Mobility Bed Mobility Overal bed mobility: Needs Assistance Bed Mobility: Rolling;Supine to Sit;Sit to Supine Rolling: Max assist   Supine to sit: Max assist;HOB elevated Sit to supine: Max assist;+2 for physical assistance        Transfers Overall transfer level: Needs assistance Equipment used: Rolling walker (2 wheeled) Transfers: Sit to/from Stand Sit to Stand: Max assist;+2 physical assistance;From elevated surface              Balance Overall balance assessment: Needs assistance Sitting-balance support: Feet supported;No upper extremity supported Sitting balance-Leahy Scale: Fair Sitting balance - Comments: Able to maintain static sitting balance at EOB during seated ADLs   Standing balance support: Bilateral upper extremity supported;During functional activity Standing balance-Leahy Scale: Zero Standing balance comment: Unable to come to full standing with maximal assistance + 2                           ADL either performed or assessed with clinical judgement   ADL Overall ADL's : Needs assistance/impaired     Grooming: Applying deodorant;Supervision/safety;Set up;Sitting                       Toileting- Clothing Manipulation and Hygiene: Maximal assistance;+2 for physical assistance;Sit to/from stand Toileting - Clothing Manipulation Details (indicate cue type and reason): MAX A for posterior peri-care; +2 required to maintain standing balance              Cognition  Arousal/Alertness: Awake/alert Behavior During Therapy: WFL for tasks assessed/performed Overall Cognitive Status: Within Functional Limits for tasks assessed                                 General Comments: A&Ox4.                   Pertinent  Vitals/ Pain       Pain Assessment: Faces Faces Pain Scale: Hurts even more Pain Location: R LE (with movement; none at rest) Pain Descriptors / Indicators: Grimacing;Guarding Pain Intervention(s): Limited activity within patient's tolerance;Monitored during session         Frequency  Min 1X/week        Progress Toward Goals  OT Goals(current goals can now be found in the care plan section)  Progress towards OT goals: Progressing toward goals  Acute Rehab OT Goals Patient Stated Goal: to get back home OT Goal Formulation: With patient Time For Goal Achievement: 04/02/21 Potential to Achieve Goals: Fair  Plan Discharge plan remains appropriate;Frequency remains appropriate       AM-PAC OT "6 Clicks" Daily Activity     Outcome Measure   Help from another person eating meals?: None Help from another person taking care of personal grooming?: A Little Help from another person toileting, which includes using toliet, bedpan, or urinal?: A Lot Help from another person bathing (including washing, rinsing, drying)?: A Lot Help from another person to put on and taking off regular upper body clothing?: A Little Help from another person to put on and taking off regular lower body clothing?: A Lot 6 Click Score: 16    End of Session Equipment Utilized During Treatment: Rolling walker;Gait belt  OT Visit Diagnosis: Unsteadiness on feet (R26.81);Muscle weakness (generalized) (M62.81);History of falling (Z91.81);Pain Pain - Right/Left: Right Pain - part of body: Hip   Activity Tolerance Patient tolerated treatment well   Patient Left in bed;with call bell/phone within reach;with bed alarm set;with family/visitor present   Nurse Communication Mobility status        Time: 1610-9604 OT Time Calculation (min): 30 min  Charges: OT General Charges $OT Visit: 1 Visit OT Treatments $Self Care/Home Management : 8-22 mins $Therapeutic Activity: 8-22 mins  Matthew Folks,  OTR/L [facility] [phone]

## 2021-03-21 NOTE — Progress Notes (Signed)
Physical Therapy Treatment Patient Details Name: Tracy Morales MRN: [id number] DOB: 05-11-1920 Today's Date: 03/21/2021   History of Present Illness Tracy Morales is a 87 y.o. female with multiple medical problem, including hypertension, hyperlipidemia, peripheral vascular disease, chronic kidney disease, cerebrovascular disease, polymyalgia rheumatica, and mild depression who lives with her daughter who presented to the ED on 10/15 after a fall. Pt to underwent R reduction and internal fixation of displaced proximal femoral shaft/subtrochanteric right hip fracture with TFN nail with Dr. Joice Lofts 10/15.    PT Comments    Pt seen again in pm after receiving pain meds, received in bed, daughter at bedside. Pt agreed to attempt out of bed if able.  MaxA with slow movements and vc's. Pt sat EOB x 4 minutes.  Attempted standing from raised bed with MaxA to RW. Pt unable to attain upright stance and was incontinent of loose stool upon attempting. Remaining session focused on hygiene, bed mobility, and ROM to B LE.s  as tolerated. Due to age and multiple comorbidities, session was modified to tolerance. Continue to recommend SNF once medically stable.    Recommendations for follow up therapy are one component of a multi-disciplinary discharge planning process, led by the attending physician.  Recommendations may be updated based on patient status, additional functional criteria and insurance authorization.  Follow Up Recommendations  SNF     Equipment Recommendations  None recommended by PT    Recommendations for Other Services OT consult     Precautions / Restrictions Precautions Precautions: Fall Restrictions Weight Bearing Restrictions: Yes RLE Weight Bearing: Weight bearing as tolerated     Mobility  Bed Mobility Overal bed mobility: Needs Assistance Bed Mobility: Rolling;Supine to Sit;Sit to Supine Rolling: Max assist   Supine to sit: Max assist;HOB elevated Sit to supine: Max  assist        Transfers Overall transfer level: Needs assistance Equipment used: Rolling walker (2 wheeled) Transfers: Sit to/from Stand Sit to Stand: Max assist;From elevated surface         General transfer comment: Deferred due to patient fatigue  Ambulation/Gait             General Gait Details: non-ambulatory   Stairs             Wheelchair Mobility    Modified Rankin (Stroke Patients Only)       Balance Overall balance assessment: Needs assistance Sitting-balance support: Feet supported;No upper extremity supported Sitting balance-Leahy Scale: Fair Sitting balance - Comments: Able to maintain static sitting EOB x 7 minutes   Standing balance support: Bilateral upper extremity supported;During functional activity Standing balance-Leahy Scale: Zero Standing balance comment: Unable to come to full standing with maximal assistance                            Cognition Arousal/Alertness: Awake/alert Behavior During Therapy: WFL for tasks assessed/performed Overall Cognitive Status: Within Functional Limits for tasks assessed                                 General Comments: A&Ox4.      Exercises Total Joint Exercises Ankle Circles/Pumps: AROM;Both;20 reps;Supine Heel Slides: PROM;Right;AROM;Left;20 reps;Supine Hip ABduction/ADduction: PROM;Right;20 reps;Supine Straight Leg Raises: PROM;Right;10 reps;Supine    General Comments General comments (skin integrity, edema, etc.): Pt incontinent of loose stool upon standing requiring increased time for rolling/bed mobility and hygiene  Pertinent Vitals/Pain Pain Assessment: 0-10 Pain Score: 4  Pain Location: R LE (with movement; none at rest) Pain Descriptors / Indicators: Grimacing;Guarding Pain Intervention(s): Premedicated before session;Monitored during session;Limited activity within patient's tolerance    Home Living                      Prior  Function            PT Goals (current goals can now be found in the care plan section) Acute Rehab PT Goals Patient Stated Goal: to get back home    Frequency    BID      PT Plan Current plan remains appropriate    Co-evaluation              AM-PAC PT "6 Clicks" Mobility   Outcome Measure  Help needed turning from your back to your side while in a flat bed without using bedrails?: A Lot Help needed moving from lying on your back to sitting on the side of a flat bed without using bedrails?: Total Help needed moving to and from a bed to a chair (including a wheelchair)?: Total Help needed standing up from a chair using your arms (e.g., wheelchair or bedside chair)?: A Lot Help needed to walk in hospital room?: Total Help needed climbing 3-5 steps with a railing? : Total 6 Click Score: 8    End of Session Equipment Utilized During Treatment: Gait belt Activity Tolerance: Patient limited by pain Patient left: in bed;with call bell/phone within reach;with bed alarm set;with SCD's reapplied Nurse Communication: Mobility status PT Visit Diagnosis: Unsteadiness on feet (R26.81);Repeated falls (R29.6);Muscle weakness (generalized) (M62.81)     Time: 6440-3474 PT Time Calculation (min) (ACUTE ONLY): 55 min  Charges:  $Therapeutic Exercise: 8-22 mins $Therapeutic Activity: 38-52 mins $Neuromuscular Re-education: 8-22 mins                    Zadie Cleverly, PTA    Jannet Askew 03/21/2021, 3:53 PM

## 2021-03-21 NOTE — Progress Notes (Signed)
  Subjective: 3 Days Post-Op Procedure(s) (LRB): INTRAMEDULLARY (IM) NAIL FEMORAL (Right) Patient reports pain as mild this morning. Patient is well, and has had no acute complaints or problems Negative for chest pain and shortness of breath Fever: no Gastrointestinal: negative for nausea and vomiting.  She has not had a BM yet but does report that she is passing gas this morning.  Objective: Vital signs in last 24 hours: Temp:  [97.9 F (36.6 C)-98.7 F (37.1 C)] 97.9 F (36.6 C) (10/18 1127) Pulse Rate:  [74-105] 90 (10/18 1127) Resp:  [14-20] 20 (10/18 1127) BP: (98-129)/(51-72) 129/72 (10/18 1127) SpO2:  [96 %-100 %] 100 % (10/18 1127)  Intake/Output from previous day:  Intake/Output Summary (Last 24 hours) at 03/21/2021 1252 Last data filed at 03/21/2021 0616 Gross per 24 hour  Intake 240 ml  Output 700 ml  Net -460 ml    Intake/Output this shift: No intake/output data recorded.  Labs: Recent Labs    03/19/21 0426 03/20/21 0419  HGB 8.7* 9.3*   Recent Labs    03/19/21 0426 03/20/21 0419  WBC 6.1 8.2  RBC 2.91* 3.10*  HCT 26.0* 27.6*  PLT 197 197   Recent Labs    03/19/21 0426  NA 136  K 4.5  CL 104  CO2 26  BUN 28*  CREATININE 1.52*  GLUCOSE 106*  CALCIUM 8.1*   No results for input(s): LABPT, INR in the last 72 hours.  EXAM General - Patient is Alert, Appropriate, and Oriented Extremity - Neurovascular intact Dorsiflexion/Plantar flexion intact Compartment soft Dressing/Incision -clean, dry, no drainage noted to the right leg. Motor Function - intact, moving foot and toes well on exam.  Abdomen soft to palpation, bowel sounds have improved this AM.  Assessment/Plan: 3 Days Post-Op Procedure(s) (LRB): INTRAMEDULLARY (IM) NAIL FEMORAL (Right) Principal Problem:   Hip fracture (HCC) Active Problems:   Hyperlipidemia   Essential hypertension   CKD stage IIIb (HCC)   DNR (do not resuscitate)   ABLA   Malnutrition of moderate  degree  Estimated body mass index is 21.19 kg/m as calculated from the following:   Height as of this encounter: 5\' 4"  (1.626 m).   Weight as of this encounter: 56 kg. Advance diet Up with therapy  Labs reviewed this AM.  Hg 9.3 on 03/20/21 Patient without BM but is passing gas at this time now.  KUB negative for ileus. Care management to assist with discharge planning.  Current recommendation is for SNF.  DVT Prophylaxis - Lovenox and SCDs Weight-Bearing as tolerated to right leg  J. 03/22/21, PA-C [facility] 03/21/2021, 12:52 PM

## 2021-03-21 NOTE — Progress Notes (Signed)
  Progress Note Tracy Morales   GLO:[id number]  DOB: [birth year]  DOA: 03/18/2021     3 Date of Service: 03/21/2021   Clinical Course 87 year old woman presented to the emergency department after mechanical fall at home resulting in right hip pain. --10/15 Admitted for right hip fracture, status post reduction and internal fixation of displaced proximal femoral shaft/subtrochanteric right hip fracture  -- 10/16 postop day 1, no complaints, anemia noted will follow, up with therapy today -- 10/17, 10/18 no new issues, planning for SNF  Assessment and Plan * Hip fracture (HCC) -- Status post mechanical fall.  Status post surgery 10/15. -- Overall doing well, continue management per orthopedics, planning SNF  ABLA -- Perioperative in nature.  Asymptomatic.  Stable.  CKD stage IIIb (HCC) -- Appears to be at baseline  Malnutrition of moderate degree --DYS3 diet to allow for easier chewing at meals --Ensure Enlive po BID, each supplement provides 350 kcal and 20 grams of protein --Magic cup TID with meals, each supplement provides 290 kcal and 9 grams of protein  Essential hypertension -- Stable, continue amlodipine  Hyperlipidemia -- Stable.  Continue simvastatin Subjective:  Feels ok today  Objective Vitals:   03/21/21 0425 03/21/21 0816 03/21/21 1127 03/21/21 1604  BP: (!) 127/52 (!) 118/58 129/72 (!) 124/57  Pulse: 74 76 90 78  Resp: 14 15 20 17   Temp: 98.5 F (36.9 C) 97.9 F (36.6 C) 97.9 F (36.6 C) 98.2 F (36.8 C)  TempSrc: Oral Oral    SpO2: 100% 98% 100% 98%  Weight:      Height:       56 kg  Vital signs were reviewed and unremarkable.  Exam Physical Exam Vitals reviewed.  Constitutional:      General: She is not in acute distress.    Appearance: She is not ill-appearing or toxic-appearing.  Cardiovascular:     Rate and Rhythm: Normal rate and regular rhythm.     Heart sounds: No murmur heard. Pulmonary:     Effort: Pulmonary effort is normal. No  respiratory distress.     Breath sounds: No wheezing, rhonchi or rales.  Psychiatric:        Mood and Affect: Mood normal.        Behavior: Behavior normal.    Labs / Other Information No new data  Disposition Plan: Status is: Inpatient  Remains inpatient appropriate because: Status post hip fracture repair, needs SNF, await authorization and bed  DNR Updated daughter at bedside  Time spent: 20 minutes [facility] 03/21/2021, 5:30 PM

## 2021-03-21 NOTE — Progress Notes (Signed)
Physical Therapy Treatment Patient Details Name: Tracy Morales MRN: [id number] DOB: 13-Aug-1919 Today's Date: 03/21/2021   History of Present Illness Tracy Morales is a 87 y.o. female with multiple medical problem, including hypertension, hyperlipidemia, peripheral vascular disease, chronic kidney disease, cerebrovascular disease, polymyalgia rheumatica, and mild depression who lives with her daughter who presented to the ED on 10/15 after a fall. Pt to underwent R reduction and internal fixation of displaced proximal femoral shaft/subtrochanteric right hip fracture with TFN nail with Dr. Joice Lofts 10/15.    PT Comments    Pt seen this am for bed mobility and B LE ROM. Further activity limited until after pt receives pain meds.  Pt has 0/10 R hip pain at rest, 4-6 with ROM. Continue to recommend SNF once medically stable   Recommendations for follow up therapy are one component of a multi-disciplinary discharge planning process, led by the attending physician.  Recommendations may be updated based on patient status, additional functional criteria and insurance authorization.  Follow Up Recommendations  SNF     Equipment Recommendations  None recommended by PT    Recommendations for Other Services OT consult     Precautions / Restrictions Precautions Precautions: Fall Restrictions Weight Bearing Restrictions: Yes RLE Weight Bearing: Weight bearing as tolerated     Mobility  Bed Mobility Overal bed mobility: Needs Assistance Bed Mobility: Rolling;Supine to Sit;Sit to Supine Rolling: Max assist   Supine to sit: Max assist;HOB elevated Sit to supine: Max assist        Transfers Overall transfer level: Needs assistance Equipment used: Rolling walker (2 wheeled) Transfers: Sit to/from Stand Sit to Stand: Max assist;From elevated surface         General transfer comment: Deferred due to patient fatigue  Ambulation/Gait             General Gait Details:  non-ambulatory   Stairs             Wheelchair Mobility    Modified Rankin (Stroke Patients Only)       Balance Overall balance assessment: Needs assistance Sitting-balance support: Feet supported;No upper extremity supported Sitting balance-Leahy Scale: Fair Sitting balance - Comments: Able to maintain static sitting EOB x 7 minutes   Standing balance support: Bilateral upper extremity supported;During functional activity Standing balance-Leahy Scale: Zero Standing balance comment: Unable to come to full standing with maximal assistance                            Cognition Arousal/Alertness: Awake/alert Behavior During Therapy: WFL for tasks assessed/performed Overall Cognitive Status: Within Functional Limits for tasks assessed                                 General Comments: A&Ox4.      Exercises Total Joint Exercises Ankle Circles/Pumps: AROM;Both;20 reps;Supine Heel Slides: PROM;Right;AROM;Left;20 reps;Supine Hip ABduction/ADduction: PROM;Right;20 reps;Supine Straight Leg Raises: PROM;Right;10 reps;Supine    General Comments General comments (skin integrity, edema, etc.): Pt incontinent of loose stool upon standing requiring increased time for rolling/bed mobility and hygiene      Pertinent Vitals/Pain Pain Assessment: 0-10 Pain Score: 4  Pain Location: R LE (with movement; none at rest) Pain Descriptors / Indicators: Grimacing;Guarding Pain Intervention(s): Premedicated before session;Monitored during session;Limited activity within patient's tolerance    Home Living  Prior Function            PT Goals (current goals can now be found in the care plan section) Acute Rehab PT Goals Patient Stated Goal: to get back home    Frequency    BID      PT Plan Current plan remains appropriate    Co-evaluation              AM-PAC PT "6 Clicks" Mobility   Outcome Measure  Help needed  turning from your back to your side while in a flat bed without using bedrails?: A Lot Help needed moving from lying on your back to sitting on the side of a flat bed without using bedrails?: Total Help needed moving to and from a bed to a chair (including a wheelchair)?: Total Help needed standing up from a chair using your arms (e.g., wheelchair or bedside chair)?: A Lot Help needed to walk in hospital room?: Total Help needed climbing 3-5 steps with a railing? : Total 6 Click Score: 8    End of Session Equipment Utilized During Treatment: Gait belt Activity Tolerance: Patient limited by pain Patient left: in bed;with call bell/phone within reach;with bed alarm set;with SCD's reapplied Nurse Communication: Mobility status PT Visit Diagnosis: Unsteadiness on feet (R26.81);Repeated falls (R29.6);Muscle weakness (generalized) (M62.81)     Time: 4128-7867 PT Time Calculation (min) (ACUTE ONLY): 55 min  Charges:  $Therapeutic Exercise: 8-22 mins $Therapeutic Activity: 38-52 mins $Neuromuscular Re-education: 8-22 mins                    Zadie Cleverly, PTA   Jannet Askew 03/21/2021, 3:49 PM

## 2021-03-22 ENCOUNTER — Telehealth: Payer: Self-pay | Admitting: Family Medicine

## 2021-03-22 NOTE — Progress Notes (Signed)
Physical Therapy Treatment Patient Details Name: Tracy Morales MRN: [id number] DOB: 1920/03/12 Today's Date: 03/22/2021   History of Present Illness Tracy Morales is a 87 y.o. female with multiple medical problem, including hypertension, hyperlipidemia, peripheral vascular disease, chronic kidney disease, cerebrovascular disease, polymyalgia rheumatica, and mild depression who lives with her daughter who presented to the ED on 10/15 after a fall. Pt to underwent R reduction and internal fixation of displaced proximal femoral shaft/subtrochanteric right hip fracture with TFN nail with Dr. Joice Lofts 10/15.    PT Comments    Pt agreed to try B LE ROM exercises this am. She was pre-medicated prior to session, yet expressed significant discomfort in R LE with gentle P/AAROM. Pt repositioned to comfort with SCD's. Heels floating, and call bell in reach.  Will plan to decrease frequency due to poor tolerance for activity/ pain level, and age tolerance.   Recommendations for follow up therapy are one component of a multi-disciplinary discharge planning process, led by the attending physician.  Recommendations may be updated based on patient status, additional functional criteria and insurance authorization.  Follow Up Recommendations  SNF     Equipment Recommendations  None recommended by PT    Recommendations for Other Services  (Pt with poor tolerance for ROM/mobility, pt may be more appropriate for decrease in frequency. MD in agreement)     Precautions / Restrictions Precautions Precautions: Fall Restrictions Weight Bearing Restrictions: Yes RLE Weight Bearing: Weight bearing as tolerated     Mobility  Bed Mobility               General bed mobility comments:  (Deferred due to pain level)    Transfers                    Ambulation/Gait                 Stairs             Wheelchair Mobility    Modified Rankin (Stroke Patients Only)        Balance                                            Cognition Arousal/Alertness: Awake/alert Behavior During Therapy: WFL for tasks assessed/performed Overall Cognitive Status: Within Functional Limits for tasks assessed                                        Exercises Total Joint Exercises Ankle Circles/Pumps: AROM;Both;20 reps;Supine Heel Slides: AAROM;Both;10 reps Hip ABduction/ADduction: AAROM;Both;10 reps Other Exercises Other Exercises:  (Gentle R hip IR/ER passively x 10)    General Comments        Pertinent Vitals/Pain Pain Assessment: Faces Faces Pain Scale: Hurts whole lot Pain Location: R LE (with movement; none at rest) Pain Descriptors / Indicators: Grimacing;Guarding Pain Intervention(s): Limited activity within patient's tolerance;Premedicated before session    Home Living                      Prior Function            PT Goals (current goals can now be found in the care plan section) Acute Rehab PT Goals Patient Stated Goal: to get back home    Frequency  Min 2X/week      PT Plan Frequency needs to be updated    Co-evaluation              AM-PAC PT "6 Clicks" Mobility   Outcome Measure  Help needed turning from your back to your side while in a flat bed without using bedrails?: A Lot Help needed moving from lying on your back to sitting on the side of a flat bed without using bedrails?: Total Help needed moving to and from a bed to a chair (including a wheelchair)?: Total Help needed standing up from a chair using your arms (e.g., wheelchair or bedside chair)?: A Lot Help needed to walk in hospital room?: Total Help needed climbing 3-5 steps with a railing? : Total 6 Click Score: 8    End of Session         PT Visit Diagnosis: Unsteadiness on feet (R26.81);Repeated falls (R29.6);Muscle weakness (generalized) (M62.81)     Time: 2979-8921 PT Time Calculation (min) (ACUTE ONLY): 15  min  Charges:  $Therapeutic Exercise: 8-22 mins                    Zadie Cleverly, PTA    Jannet Askew 03/22/2021, 12:26 PM

## 2021-03-22 NOTE — Progress Notes (Signed)
PROGRESS NOTE    Tracy Morales  VFI:[id number] DOB: 09/16/1919 DOA: 03/18/2021 PCP: Judy Pimple, MD   Brief Narrative: Taken from prior notes. Tracy Morales is a 87 y.o. female with medical history significant of CKD; HLD; PMR; and CVA presenting with a mechanical fall, resulted in right hip fracture, s/p ORIF.  Tolerated the procedure well. Patient with poor p.o. intake at baseline.  Usually drinks ensures at home. PT is recommending SNF-medically stable, awaiting placement.  Patient also has Foley catheter in place, apparently was doing in and out catheterization, multiple times in a day while at home.  High risk for deterioration and death-discussed with daughter and they will think over about involving palliative care.  Subjective: Patient was resting comfortably when seen today.  Denies any pain.  Appears little lethargic.  Assessment & Plan:   Principal Problem:   Hip fracture (HCC) Active Problems:   Hyperlipidemia   Essential hypertension   CKD stage IIIb (HCC)   DNR (do not resuscitate)   ABLA   Malnutrition of moderate degree  Right hip fracture.  Secondary to mechanical fall, s/p ORIF on 03/18/2021. PT is recommending SNF-medically stable for rehab -Continue with pain management  Acute blood loss anemia.  Currently stable.  CKD stage IIIb.  Currently stable -Continue to monitor  Malnutrition of moderate degree. Estimated body mass index is 21.19 kg/m as calculated from the following:   Height as of this encounter: 5\' 4"  (1.626 m).   Weight as of this encounter: 56 kg.  -Continue with dysphagia 3 diet to allow for easier chewing of meals. -Continue with Ensure Enlive and Magic cup.  Essential hypertension.  Blood pressure within goal. -Continue home dose of amlodipine  Dyslipidemia. -Continue home dose of simvastatin.  Objective: Vitals:   03/21/21 2045 03/22/21 0755 03/22/21 1133 03/22/21 1546  BP: (!) 134/59 (!) 139/58 (!) 113/57 (!) 99/52   Pulse: 81 78 78 75  Resp: 17 16 16 17   Temp: 100.2 F (37.9 C) 99.5 F (37.5 C) 98.5 F (36.9 C) 97.9 F (36.6 C)  TempSrc: Oral Oral    SpO2: 98% 99% 97% 96%  Weight:      Height:        Intake/Output Summary (Last 24 hours) at 03/22/2021 1620 Last data filed at 03/22/2021 0522 Gross per 24 hour  Intake --  Output 750 ml  Net -750 ml   Filed Weights   03/18/21 0550  Weight: 56 kg    Examination:  General exam: Frail and malnourished elderly lady, appears calm and comfortable  Respiratory system: Clear to auscultation. Respiratory effort normal. Cardiovascular system: S1 & S2 heard, RRR.  Gastrointestinal system: Soft, nontender, nondistended, bowel sounds positive. Central nervous system: Alert and oriented. No focal neurological deficits. Extremities: No edema, no cyanosis, pulses intact and symmetrical. Psychiatry: Judgement and insight appear impaired   DVT prophylaxis: Lovenox Code Status: DNR Family Communication: Discussed with daughter on phone Disposition Plan:  Status is: Inpatient  Remains inpatient appropriate because: Of severity of illness, currently stable for rehab.   Level of care: Med-Surg  All the records are reviewed and case discussed with Care Management/Social Worker. Management plans discussed with the patient, nursing and they are in agreement.  Consultants:  Orthopedic   Procedures:  Antimicrobials:   Data Reviewed: I have personally reviewed following labs and imaging studies  CBC: Recent Labs  Lab 03/18/21 0923 03/19/21 0426 03/20/21 0419  WBC 6.8 6.1 8.2  NEUTROABS 5.3  --   --  HGB 12.1 8.7* 9.3*  HCT 37.0 26.0* 27.6*  MCV 88.3 89.3 89.0  PLT 242 197 197   Basic Metabolic Panel: Recent Labs  Lab 03/18/21 0923 03/19/21 0426  NA 137 136  K 4.1 4.5  CL 102 104  CO2 24 26  GLUCOSE 130* 106*  BUN 30* 28*  CREATININE 1.49* 1.52*  CALCIUM 8.8* 8.1*   GFR: Estimated Creatinine Clearance: 17.4 mL/min (A) (by  C-G formula based on SCr of 1.52 mg/dL (H)). Liver Function Tests: No results for input(s): AST, ALT, ALKPHOS, BILITOT, PROT, ALBUMIN in the last 168 hours. No results for input(s): LIPASE, AMYLASE in the last 168 hours. No results for input(s): AMMONIA in the last 168 hours. Coagulation Profile: Recent Labs  Lab 03/18/21 0923  INR 1.0   Cardiac Enzymes: No results for input(s): CKTOTAL, CKMB, CKMBINDEX, TROPONINI in the last 168 hours. BNP (last 3 results) No results for input(s): PROBNP in the last 8760 hours. HbA1C: No results for input(s): HGBA1C in the last 72 hours. CBG: No results for input(s): GLUCAP in the last 168 hours. Lipid Profile: No results for input(s): CHOL, HDL, LDLCALC, TRIG, CHOLHDL, LDLDIRECT in the last 72 hours. Thyroid Function Tests: No results for input(s): TSH, T4TOTAL, FREET4, T3FREE, THYROIDAB in the last 72 hours. Anemia Panel: No results for input(s): VITAMINB12, FOLATE, FERRITIN, TIBC, IRON, RETICCTPCT in the last 72 hours. Sepsis Labs: No results for input(s): PROCALCITON, LATICACIDVEN in the last 168 hours.  Recent Results (from the past 240 hour(s))  Resp Panel by RT-PCR (Flu A&B, Covid) Nasopharyngeal Swab     Status: None   Collection Time: 03/18/21  9:23 AM   Specimen: Nasopharyngeal Swab; Nasopharyngeal(NP) swabs in vial transport medium  Result Value Ref Range Status   SARS Coronavirus 2 by RT PCR NEGATIVE NEGATIVE Final    Comment: (NOTE) SARS-CoV-2 target nucleic acids are NOT DETECTED.  The SARS-CoV-2 RNA is generally detectable in upper respiratory specimens during the acute phase of infection. The lowest concentration of SARS-CoV-2 viral copies this assay can detect is 138 copies/mL. A negative result does not preclude SARS-Cov-2 infection and should not be used as the sole basis for treatment or other patient management decisions. A negative result may occur with  improper specimen collection/handling, submission of specimen  other than nasopharyngeal swab, presence of viral mutation(s) within the areas targeted by this assay, and inadequate number of viral copies(<138 copies/mL). A negative result must be combined with clinical observations, patient history, and epidemiological information. The expected result is Negative.  Fact Sheet for Patients:  BloggerCourse.com  Fact Sheet for Healthcare Providers:  SeriousBroker.it  This test is no t yet approved or cleared by the [place] FDA and  has been authorized for detection and/or diagnosis of SARS-CoV-2 by FDA under an Emergency Use Authorization (EUA). This EUA will remain  in effect (meaning this test can be used) for the duration of the COVID-19 declaration under Section 564(b)(1) of the Act, 21 U.S.C.section 360bbb-3(b)(1), unless the authorization is terminated  or revoked sooner.       Influenza A by PCR NEGATIVE NEGATIVE Final   Influenza B by PCR NEGATIVE NEGATIVE Final    Comment: (NOTE) The Xpert Xpress SARS-CoV-2/FLU/RSV plus assay is intended as an aid in the diagnosis of influenza from Nasopharyngeal swab specimens and should not be used as a sole basis for treatment. Nasal washings and aspirates are unacceptable for Xpert Xpress SARS-CoV-2/FLU/RSV testing.  Fact Sheet for Patients: BloggerCourse.com  Fact Sheet for Healthcare  Providers: SeriousBroker.it  This test is not yet approved or cleared by the [place] and has been authorized for detection and/or diagnosis of SARS-CoV-2 by FDA under an Emergency Use Authorization (EUA). This EUA will remain in effect (meaning this test can be used) for the duration of the COVID-19 declaration under Section 564(b)(1) of the Act, 21 U.S.C. section 360bbb-3(b)(1), unless the authorization is terminated or revoked.  Performed at [facility], [address],  [place], [place] [address]      Radiology Studies: No results found.  Scheduled Meds:  amLODipine  5 mg Oral Daily   Chlorhexidine Gluconate Cloth  6 each Topical Daily   docusate sodium  100 mg Oral BID   enoxaparin (LOVENOX) injection  30 mg Subcutaneous Q24H   feeding supplement  237 mL Oral BID BM   mirtazapine  30 mg Oral QHS   polyethylene glycol  17 g Oral BID   senna  1 tablet Oral QHS   simvastatin  20 mg Oral Daily   Continuous Infusions:  methocarbamol (ROBAXIN) IV       LOS: 4 days   Time spent: 40 minutes. More than 50% of the time was spent in counseling/coordination of care  Arnetha Courser, MD [facility]  If 7PM-7AM, please contact night-coverage [email or web address]  03/22/2021, 4:20 PM   This record has been created using Conservation officer, historic buildings. Errors have been sought and corrected,but may not always be located. Such creation errors do not reflect on the standard of care.

## 2021-03-22 NOTE — TOC Progression Note (Signed)
Transition of Care [facility]) - Progression Note    Patient Details  Name: Tracy Morales MRN: [id number] Date of Birth: February 27, 1920  [facility] CM/SW Contact  Caryn Section, RN Phone Number: 03/22/2021, 9:50 AM  Clinical Narrative:   Daughter Tracy Morales stated family chooses [facility].  [facility] at [place] aware, cannot take today, but will follow up with Alvino Chapel when discharged.  Auth in progress.  TOC to follow.    Expected Discharge Plan: Skilled Nursing Facility Barriers to Discharge: Continued Medical Work up  Expected Discharge Plan and Services Expected Discharge Plan: Skilled Nursing Facility   Discharge Planning Services: CM Consult Post Acute Care Choice: Skilled Nursing Facility Living arrangements for the past 2 months: Single Family Home                 DME Arranged:  (none at this time)         HH Arranged:  (SNF)           Social Determinants of Health (SDOH) Interventions    Readmission Risk Interventions No flowsheet data found.

## 2021-03-22 NOTE — Telephone Encounter (Signed)
Pt daughter called stated pt is in the the hospital and if her medication needs to be on hold . Do she need to call the pharmacy . Please advise [phone]

## 2021-03-22 NOTE — Telephone Encounter (Signed)
Aware, hope she is doing ok.  If she had any px to pick up then tell her pharmacy to hold or put back.  Thanks

## 2021-03-22 NOTE — Discharge Instructions (Signed)

## 2021-03-22 NOTE — Telephone Encounter (Signed)
Daughter notified of Dr. Tower's comments and verbalized understanding  

## 2021-03-22 NOTE — Progress Notes (Signed)
  Subjective: 4 Days Post-Op Procedure(s) (LRB): INTRAMEDULLARY (IM) NAIL FEMORAL (Right) Patient denies any pain today. Patient is well, and has had no acute complaints or problems Negative for chest pain and shortness of breath Fever: no Gastrointestinal: negative for nausea and vomiting.  Patient has had a BM.   Objective: Vital signs in last 24 hours: Temp:  [98.2 F (36.8 C)-100.2 F (37.9 C)] 98.5 F (36.9 C) (10/19 1133) Pulse Rate:  [78-81] 78 (10/19 1133) Resp:  [16-17] 16 (10/19 1133) BP: (113-139)/(57-59) 113/57 (10/19 1133) SpO2:  [97 %-99 %] 97 % (10/19 1133)  Intake/Output from previous day:  Intake/Output Summary (Last 24 hours) at 03/22/2021 1225 Last data filed at 03/22/2021 0522 Gross per 24 hour  Intake --  Output 750 ml  Net -750 ml    Intake/Output this shift: No intake/output data recorded.  Labs: Recent Labs    03/20/21 0419  HGB 9.3*   Recent Labs    03/20/21 0419  WBC 8.2  RBC 3.10*  HCT 27.6*  PLT 197   No results for input(s): NA, K, CL, CO2, BUN, CREATININE, GLUCOSE, CALCIUM in the last 72 hours.  No results for input(s): LABPT, INR in the last 72 hours.  EXAM General - Patient is Alert and Appropriate Extremity - Neurovascular intact Dorsiflexion/Plantar flexion intact Compartment soft Dressing/Incision -clean, dry, no drainage noted to the right leg. Motor Function - intact, moving foot and toes well on exam.  Abdomen soft to palpation, bowel sounds are intact this AM.  Assessment/Plan: 4 Days Post-Op Procedure(s) (LRB): INTRAMEDULLARY (IM) NAIL FEMORAL (Right) Principal Problem:   Hip fracture (HCC) Active Problems:   Hyperlipidemia   Essential hypertension   CKD stage IIIb (HCC)   DNR (do not resuscitate)   ABLA   Malnutrition of moderate degree  Estimated body mass index is 21.19 kg/m as calculated from the following:   Height as of this encounter: 5\' 4"  (1.626 m).   Weight as of this encounter: 56  kg. Advance diet Up with therapy  Vitals are stable this AM. Patient has had a BM this AM. Plan for discharge to SNF when appropriate.  Upon discharge, staple can be removed on 04/01/21 by SNF. Follow-up with [facility] in 6 weeks for x-rays of the right femur.  DVT Prophylaxis - Lovenox and SCDs Weight-Bearing as tolerated to right leg  [facility], PA-C [facility] 03/22/2021, 12:25 PM

## 2021-03-23 DIAGNOSIS — Z7401 Bed confinement status: Secondary | ICD-10-CM | POA: Diagnosis not present

## 2021-03-23 DIAGNOSIS — Z9181 History of falling: Secondary | ICD-10-CM | POA: Diagnosis not present

## 2021-03-23 DIAGNOSIS — I639 Cerebral infarction, unspecified: Secondary | ICD-10-CM | POA: Diagnosis not present

## 2021-03-23 DIAGNOSIS — E440 Moderate protein-calorie malnutrition: Secondary | ICD-10-CM | POA: Diagnosis not present

## 2021-03-23 DIAGNOSIS — S72001A Fracture of unspecified part of neck of right femur, initial encounter for closed fracture: Secondary | ICD-10-CM | POA: Diagnosis not present

## 2021-03-23 DIAGNOSIS — M6281 Muscle weakness (generalized): Secondary | ICD-10-CM | POA: Diagnosis not present

## 2021-03-23 DIAGNOSIS — S72321D Displaced transverse fracture of shaft of right femur, subsequent encounter for closed fracture with routine healing: Secondary | ICD-10-CM | POA: Diagnosis not present

## 2021-03-23 DIAGNOSIS — I959 Hypotension, unspecified: Secondary | ICD-10-CM | POA: Diagnosis not present

## 2021-03-23 DIAGNOSIS — D62 Acute posthemorrhagic anemia: Secondary | ICD-10-CM | POA: Diagnosis not present

## 2021-03-23 DIAGNOSIS — R2681 Unsteadiness on feet: Secondary | ICD-10-CM | POA: Diagnosis not present

## 2021-03-23 DIAGNOSIS — N1832 Chronic kidney disease, stage 3b: Secondary | ICD-10-CM | POA: Diagnosis not present

## 2021-03-23 DIAGNOSIS — N1831 Chronic kidney disease, stage 3a: Secondary | ICD-10-CM | POA: Diagnosis not present

## 2021-03-23 DIAGNOSIS — L89153 Pressure ulcer of sacral region, stage 3: Secondary | ICD-10-CM | POA: Diagnosis not present

## 2021-03-23 DIAGNOSIS — R2689 Other abnormalities of gait and mobility: Secondary | ICD-10-CM | POA: Diagnosis not present

## 2021-03-23 DIAGNOSIS — S72009A Fracture of unspecified part of neck of unspecified femur, initial encounter for closed fracture: Secondary | ICD-10-CM | POA: Diagnosis not present

## 2021-03-23 DIAGNOSIS — I10 Essential (primary) hypertension: Secondary | ICD-10-CM | POA: Diagnosis not present

## 2021-03-23 DIAGNOSIS — E785 Hyperlipidemia, unspecified: Secondary | ICD-10-CM | POA: Diagnosis not present

## 2021-03-23 DIAGNOSIS — D649 Anemia, unspecified: Secondary | ICD-10-CM | POA: Diagnosis not present

## 2021-03-23 DIAGNOSIS — W19XXXA Unspecified fall, initial encounter: Secondary | ICD-10-CM | POA: Diagnosis not present

## 2021-03-23 DIAGNOSIS — I69891 Dysphagia following other cerebrovascular disease: Secondary | ICD-10-CM | POA: Diagnosis not present

## 2021-03-23 DIAGNOSIS — R531 Weakness: Secondary | ICD-10-CM | POA: Diagnosis not present

## 2021-03-23 LAB — SARS CORONAVIRUS 2 (TAT 6-24 HRS): SARS Coronavirus 2: NEGATIVE

## 2021-03-23 MED ORDER — BISACODYL 10 MG RE SUPP: 10.0000 mg | Freq: Every day | RECTAL | 0 refills | Status: DC | PRN

## 2021-03-23 MED ORDER — ENSURE ENLIVE PO LIQD
237.0000 mL | Freq: Two times a day (BID) | ORAL | 12 refills | Status: DC
Start: 2021-03-23 — End: 2024-01-15

## 2021-03-23 MED ORDER — POLYETHYLENE GLYCOL 3350 17 G PO PACK: 17.0000 g | PACK | Freq: Two times a day (BID) | ORAL | 0 refills | Status: DC

## 2021-03-23 MED ORDER — DOCUSATE SODIUM 100 MG PO CAPS: 100.0000 mg | ORAL_CAPSULE | Freq: Two times a day (BID) | ORAL | 0 refills | Status: DC

## 2021-03-23 NOTE — TOC Progression Note (Addendum)
Transition of Care [facility]) - Progression Note    Patient Details  Name: Tracy Morales MRN: [id number] Date of Birth: [birth year]  [facility] CM/SW Contact  Caryn Section, RN Phone Number: 03/23/2021, 1:24 PM  Clinical Narrative:    Patient can discharge today to [facility] place pending negative COVID test.  [facility] at [place] place aware.  [facility] aware of transfer.    Expected Discharge Plan: Skilled Nursing Facility Barriers to Discharge: Continued Medical Work up  Expected Discharge Plan and Services Expected Discharge Plan: Skilled Nursing Facility   Discharge Planning Services: CM Consult Post Acute Care Choice: Skilled Nursing Facility Living arrangements for the past 2 months: Single Family Home Expected Discharge Date: 03/23/21               DME Arranged:  (none at this time)         HH Arranged:  (SNF)           Social Determinants of Health (SDOH) Interventions    Readmission Risk Interventions No flowsheet data found.

## 2021-03-23 NOTE — Progress Notes (Signed)
Patient discharging to skilled nursing facility [facility] per MD order. Transport via EMS. Attempted to call report to receiving nurse but no answer. Will continue to try and call report. Patient discharged with foley cath in place per order, vital signs stable, in no acute distress.

## 2021-03-23 NOTE — Care Management Important Message (Signed)
Important Message  Patient Details  Name: DOSIA YODICE MRN: [id number] Date of Birth: 12/23/1919   Medicare Important Message Given:  Other (see comment)  I left a message for the patients daughter, [name] [phone]) to call me at her convenience so I could review the Important Message from Medicare with her. Will await a return call.   Olegario Messier A Iyania Denne 03/23/2021, 2:09 PM

## 2021-03-23 NOTE — Discharge Summary (Signed)
Physician Discharge Summary  Tracy Morales:[id number] DOB: 06/13/1919 DOA: 03/18/2021  PCP: Judy Pimple, MD  Admit date: 03/18/2021 Discharge date: 03/23/2021  Admitted From: Home Disposition: SNF  Recommendations for Outpatient Follow-up:  Follow up with PCP in 1-2 weeks Follow-up with orthopedic surgery in 4 to 6 weeks Please obtain BMP/CBC in one week Please follow up on the following pending results: None  Home Health: No Equipment/Devices: Discharge Condition: Stable but guarded CODE STATUS: DNR Diet recommendation: Heart Healthy   Brief/Interim Summary: Tracy Morales is a 87 y.o. female with medical history significant of CKD; HLD; PMR; and CVA presenting with a mechanical fall, resulted in right hip fracture, s/p ORIF.  Tolerated the procedure well. She was evaluated by our PT and OT and they recommended going to rehab.  Per orthopedic surgery she still has staples which needs to be removed on 04/01/2021, can be done at a rehab facility.  She will follow-up with them in 4 to 6 weeks for repeat imaging and further recommendations.  Patient also has poor p.o. intake, mostly drinks Ensure.  Per daughter this is been going on for a while and even at home she preferred to just drink ensures most of the time. She should continue with Ensure to provide nutrition.  She has moderate degree protein caloric malnutrition with BMI of 21.19.  She was also given dysphagia 3 diet to allow easier chewing of meals based on her age and mentation.  Patient has currently Foley catheter in place, apparently was doing in and out catheterization multiple times in a day at home.  She can follow-up with urology as an outpatient for further recommendations if needed.  If Foley catheter remains in then it needs to be changed every month.  Patient is very high risk for deterioration and death based on her advanced age, prior comorbidities and recent hip fracture.  Discussed with daughter and they  will think over involving palliative care as an outpatient.  Patient has an history of CKD stage IIIb and creatinine remained stable during her stay in the hospital.  She will continue with rest of her home medications and follow-up with her providers.  Discharge Diagnoses:  Principal Problem:   Hip fracture (HCC) Active Problems:   Hyperlipidemia   Essential hypertension   CKD stage IIIb (HCC)   DNR (do not resuscitate)   ABLA   Malnutrition of moderate degree   Discharge Instructions  Discharge Instructions     Diet - low sodium heart healthy   Complete by: As directed    Discharge wound care:   Complete by: As directed    Change with clean dressing as needed. Staples to come out on 04/01/2021.   Increase activity slowly   Complete by: As directed       Allergies as of 03/23/2021       Reactions   Ace Inhibitors    REACTION: cough   Donepezil Diarrhea, Other (See Comments)   GI problems Other reaction(s): Other (See Comments) GI problems   Sulfa Antibiotics Nausea Only   headache        Medication List     TAKE these medications    acetaminophen 500 MG tablet Commonly known as: TYLENOL Take 500 mg by mouth every 6 (six) hours as needed. OTC-UAD   amLODipine 5 MG tablet Commonly known as: NORVASC TAKE 1 TABLET BY MOUTH EVERY DAY   aspirin 325 MG EC tablet Take 325 mg by mouth daily.   bisacodyl  10 MG suppository Commonly known as: DULCOLAX Place 1 suppository (10 mg total) rectally daily as needed for moderate constipation.   BONINE PO Take 50 mg by mouth as needed.   ciclopirox 0.77 % Susp Commonly known as: LOPROX APPLY TO SCALP 3 TIMES WEEKLY AND LEAVE IN AS DIRECTED. NOT COVERED BY PLAN What changed: See the new instructions.   clobetasol 0.05 % external solution Commonly known as: TEMOVATE APPLY TO SCALP ONCE DAILY AS NEEDED FOR ITCH. USE ON SCALP. AVOID FACE / GROIN / UNDERARM   CRANBERRY PO Take 1 tablet by mouth daily.    docusate sodium 100 MG capsule Commonly known as: COLACE Take 1 capsule (100 mg total) by mouth 2 (two) times daily.   enoxaparin 40 MG/0.4ML injection Commonly known as: LOVENOX Inject 0.4 mLs (40 mg total) into the skin daily.   feeding supplement Liqd Take 237 mLs by mouth 2 (two) times daily between meals.   ferrous sulfate 325 (65 FE) MG tablet Take 1 tablet (325 mg total) by mouth 2 (two) times daily.   Fluocinolone Acetonide Body 0.01 % Oil Apply 1 application topically daily.   HYDROcodone-acetaminophen 5-325 MG tablet Commonly known as: NORCO/VICODIN Take 0.5-1 tablets by mouth every 6 (six) hours as needed for moderate pain.   hydrocortisone 2.5 % cream apply to affected area on Eyebrows twice daily for up to 2 week. Use on FACE   ketoconazole 2 % shampoo Commonly known as: NIZORAL MESSAGE INTO SCALP 3 TIMES A WEEK, LEAVE ON FOR 10 MINUTES What changed: See the new instructions.   mirtazapine 30 MG tablet Commonly known as: REMERON TAKE 1 TABLET BY MOUTH EVERYDAY AT BEDTIME What changed: See the new instructions.   polyethylene glycol 17 g packet Commonly known as: MIRALAX / GLYCOLAX Take 17 g by mouth 2 (two) times daily.   psyllium 58.6 % packet Commonly known as: METAMUCIL Take 1 packet by mouth daily as needed.   simvastatin 20 MG tablet Commonly known as: ZOCOR TAKE 1 TABLET BY MOUTH EVERY DAY   Vitamin D3 50 MCG (2000 UT) capsule Take 2,000 Units by mouth daily.               Discharge Care Instructions  (From admission, onward)           Start     Ordered   03/23/21 0000  Discharge wound care:       Comments: Change with clean dressing as needed. Staples to come out on 04/01/2021.   03/23/21 1254            Contact information for follow-up providers     Anson Oregon, PA-C Follow up in 6 week(s).   Specialty: Physician Assistant Why: Mindi Slicker information: [address] [place] [address] [phone]         Judy Pimple, MD Follow up in 1 week(s).   Specialties: Family Medicine, Radiology Contact information: [address] [place] [address] [phone]              Contact information for after-discharge care     Destination     [facility] .   Service: Skilled Nursing Contact information: [address] [place] [address] [phone]                    Allergies  Allergen Reactions   Ace Inhibitors     REACTION: cough  Donepezil Diarrhea and Other (See Comments)    GI problems Other reaction(s): Other (See Comments) GI problems   Sulfa Antibiotics Nausea Only    headache    Consultations: Orthopedic surgery  Procedures/Studies: DG Knee 2 Views Right  Result Date: 03/18/2021 CLINICAL DATA:  Fall, RIGHT knee swelling. EXAM: RIGHT KNEE - 1-2 VIEW COMPARISON:  None. FINDINGS: No osseous fracture or acute dislocation. Degenerative narrowing of the medial compartment, at least moderate in degree with associated osseous spurring. Milder degenerative changes at the lateral and patellofemoral compartments. No appreciable joint effusion. Vascular calcifications involving the distal SFA and popliteal arteries. IMPRESSION: 1. No acute findings. No osseous fracture or dislocation. 2. Degenerative changes, at least moderate in degree at the medial compartment. Electronically Signed   By: Bary Richard M.D.   On: 03/18/2021 06:23   DG Abd 1 View  Result Date: 03/20/2021 CLINICAL DATA:  Constipation, abdominal distension EXAM: ABDOMEN - 1 VIEW COMPARISON:  November 07, 2016 FINDINGS: Air and stool filled nondilated loops of bowel. Mild colonic stool burden predominately in the RIGHT hemicolon. Atherosclerotic calcifications. Osteopenia. Status post intramedullary rod fixation of the RIGHT femur. Small amount of soft tissue air within the RIGHT-sided subcutaneous  tissues consistent with recent surgical intervention. Status post cholecystectomy. IMPRESSION: Nonobstructive bowel gas pattern. Electronically Signed   By: Meda Klinefelter M.D.   On: 03/20/2021 11:55   CT HEAD WO CONTRAST ( )  Result Date: 03/18/2021 CLINICAL DATA:  Fall, head trauma EXAM: CT HEAD WITHOUT CONTRAST TECHNIQUE: Contiguous axial images were obtained from the base of the skull through the vertex without intravenous contrast. COMPARISON:  CT head 02/14/2019 FINDINGS: Brain: There is no acute intracranial hemorrhage, extra-axial fluid collection, or acute infarct. There is mild global parenchymal volume loss and chronic white matter microangiopathy. The ventricles are not enlarged. There is no mass lesion. There is no midline shift. Vascular: There is calcification of the bilateral cavernous ICAs. Skull: Normal. Negative for fracture or focal lesion. Sinuses/Orbits: The imaged paranasal sinuses are clear. Bilateral lens implants are in place. The globes and orbits are otherwise unremarkable. Other: None. IMPRESSION: No acute intracranial hemorrhage or calvarial fracture. Electronically Signed   By: Lesia Hausen M.D.   On: 03/18/2021 09:05   CT Cervical Spine Wo Contrast  Result Date: 03/18/2021 CLINICAL DATA:  Fall EXAM: CT CERVICAL SPINE WITHOUT CONTRAST TECHNIQUE: Multidetector CT imaging of the cervical spine was performed without intravenous contrast. Multiplanar CT image reconstructions were also generated. COMPARISON:  CTA neck 02/14/2019 FINDINGS: Alignment: There is trace anterolisthesis of C3 on C4, grade 1 retrolisthesis of C5 on C6, and grade 1 anterolisthesis of C7 on T1, not significantly changed since 2020 and likely degenerative in nature. There is no jumped or perched facet or other evidence of traumatic malalignment. Skull base and vertebrae: Skull base alignment is maintained. Vertebral body heights are preserved. There is no evidence of acute fracture. Soft tissues and  spinal canal: No prevertebral fluid or swelling. No visible canal hematoma. Disc levels: There is marked intervertebral disc space narrowing with associated uncovertebral arthropathy at C5-C6 and C6-C7. There is multilevel facet arthropathy with ankylosis of the left posterior elements at C4-C5. The spinal canal is patent. There is severe right neural foraminal stenosis at C4-C5 and severe bilateral neural foraminal stenosis at C5-C6. Upper chest: There is scarring and bullous change in the left lung apex, similar to the prior CTA neck. The right lung apex is clear. Other: There is calcified atherosclerotic plaque of the bilateral  carotid bulbs. IMPRESSION: 1. No acute fracture or traumatic malalignment of the cervical spine. 2. Multilevel spondylosis and spondylolisthesis as above, most advanced at C5-C6. Electronically Signed   By: Lesia Hausen M.D.   On: 03/18/2021 09:09   DG Chest Portable 1 View  Result Date: 03/18/2021 CLINICAL DATA:  Preop chest x-ray EXAM: PORTABLE CHEST 1 VIEW COMPARISON:  11/07/2016 FINDINGS: Chronic cardiomegaly with mild aortic tortuosity. There is no edema, consolidation, effusion, or pneumothorax. Remote compression fracture at the thoracolumbar junction. IMPRESSION: Stable exam.  No acute finding. Electronically Signed   By: Tiburcio Pea M.D.   On: 03/18/2021 09:00   DG HIP OPERATIVE UNILAT W OR W/O PELVIS RIGHT  Result Date: 03/18/2021 CLINICAL DATA:  Right femur fracture. EXAM: OPERATIVE RIGHT HIP (WITH PELVIS IF PERFORMED) TECHNIQUE: Fluoroscopic spot image(s) were submitted for interpretation post-operatively. COMPARISON:  X-ray 03/18/2021 FINDINGS: 16 C-arm fluoroscopic images were obtained intraoperatively and submitted for post operative interpretation. Images demonstrate placement of long intramedullary rod with proximal lag screw and distal interlocking screws traversing subtrochanteric right femoral fracture. Improved fracture alignment on the included  projections. 1.3 minutes fluoroscopy time utilized. Please see the performing provider's procedural report for further detail. IMPRESSION: As above. Electronically Signed   By: Duanne Guess D.O.   On: 03/18/2021 15:31   DG Hip Unilat W or Wo Pelvis 2-3 Views Right  Result Date: 03/18/2021 CLINICAL DATA:  Fall with hip injury EXAM: DG HIP (WITH OR WITHOUT PELVIS) 2-3V RIGHT COMPARISON:  04/15/2015 FINDINGS: Acute fracture of the right femoral shaft with marked displacement and over riding. Fracture is oblique with sharp margins. Osteopenia and atherosclerosis. IMPRESSION: Highly displaced upper right femoral shaft fracture. Electronically Signed   By: Tiburcio Pea M.D.   On: 03/18/2021 09:01    Subjective: Patient was seen and examined today.  Having some right hip pain.  Received pain medications less than an hour ago.  Discharge Exam: Vitals:   03/23/21 0747 03/23/21 1131  BP: (!) 128/53 105/62  Pulse: 71 75  Resp:  18  Temp:  98.7 F (37.1 C)  SpO2: 97% 99%   Vitals:   03/23/21 0440 03/23/21 0745 03/23/21 0747 03/23/21 1131  BP: (!) 126/52  (!) 128/53 105/62  Pulse: 71 71 71 75  Resp: 18   18  Temp: 98.7 F (37.1 C) 98.7 F (37.1 C)  98.7 F (37.1 C)  TempSrc:    Oral  SpO2: 100% 98% 97% 99%  Weight:      Height:        General: Pt is alert, awake, not in acute distress Cardiovascular: RRR, S1/S2 +, no rubs, no gallops Respiratory: CTA bilaterally, no wheezing, no rhonchi Abdominal: Soft, NT, ND, bowel sounds + Extremities: no edema, no cyanosis   The results of significant diagnostics from this hospitalization (including imaging, microbiology, ancillary and laboratory) are listed below for reference.    Microbiology: Recent Results (from the past 240 hour(s))  Resp Panel by RT-PCR (Flu A&B, Covid) Nasopharyngeal Swab     Status: None   Collection Time: 03/18/21  9:23 AM   Specimen: Nasopharyngeal Swab; Nasopharyngeal(NP) swabs in vial transport medium   Result Value Ref Range Status   SARS Coronavirus 2 by RT PCR NEGATIVE NEGATIVE Final    Comment: (NOTE) SARS-CoV-2 target nucleic acids are NOT DETECTED.  The SARS-CoV-2 RNA is generally detectable in upper respiratory specimens during the acute phase of infection. The lowest concentration of SARS-CoV-2 viral copies this assay can detect is 138  copies/mL. A negative result does not preclude SARS-Cov-2 infection and should not be used as the sole basis for treatment or other patient management decisions. A negative result may occur with  improper specimen collection/handling, submission of specimen other than nasopharyngeal swab, presence of viral mutation(s) within the areas targeted by this assay, and inadequate number of viral copies(<138 copies/mL). A negative result must be combined with clinical observations, patient history, and epidemiological information. The expected result is Negative.  Fact Sheet for Patients:  BloggerCourse.com  Fact Sheet for Healthcare Providers:  SeriousBroker.it  This test is no t yet approved or cleared by the [place] FDA and  has been authorized for detection and/or diagnosis of SARS-CoV-2 by FDA under an Emergency Use Authorization (EUA). This EUA will remain  in effect (meaning this test can be used) for the duration of the COVID-19 declaration under Section 564(b)(1) of the Act, 21 U.S.C.section 360bbb-3(b)(1), unless the authorization is terminated  or revoked sooner.       Influenza A by PCR NEGATIVE NEGATIVE Final   Influenza B by PCR NEGATIVE NEGATIVE Final    Comment: (NOTE) The Xpert Xpress SARS-CoV-2/FLU/RSV plus assay is intended as an aid in the diagnosis of influenza from Nasopharyngeal swab specimens and should not be used as a sole basis for treatment. Nasal washings and aspirates are unacceptable for Xpert Xpress SARS-CoV-2/FLU/RSV testing.  Fact Sheet for  Patients: BloggerCourse.com  Fact Sheet for Healthcare Providers: SeriousBroker.it  This test is not yet approved or cleared by the [place] FDA and has been authorized for detection and/or diagnosis of SARS-CoV-2 by FDA under an Emergency Use Authorization (EUA). This EUA will remain in effect (meaning this test can be used) for the duration of the COVID-19 declaration under Section 564(b)(1) of the Act, 21 U.S.C. section 360bbb-3(b)(1), unless the authorization is terminated or revoked.  Performed at [facility], [address] [place], [place] [address]      Labs: BNP (last 3 results) No results for input(s): BNP in the last 8760 hours. Basic Metabolic Panel: Recent Labs  Lab 03/18/21 0923 03/19/21 0426  NA 137 136  K 4.1 4.5  CL 102 104  CO2 24 26  GLUCOSE 130* 106*  BUN 30* 28*  CREATININE 1.49* 1.52*  CALCIUM 8.8* 8.1*   Liver Function Tests: No results for input(s): AST, ALT, ALKPHOS, BILITOT, PROT, ALBUMIN in the last 168 hours. No results for input(s): LIPASE, AMYLASE in the last 168 hours. No results for input(s): AMMONIA in the last 168 hours. CBC: Recent Labs  Lab 03/18/21 0923 03/19/21 0426 03/20/21 0419  WBC 6.8 6.1 8.2  NEUTROABS 5.3  --   --   HGB 12.1 8.7* 9.3*  HCT 37.0 26.0* 27.6*  MCV 88.3 89.3 89.0  PLT 242 197 197   Cardiac Enzymes: No results for input(s): CKTOTAL, CKMB, CKMBINDEX, TROPONINI in the last 168 hours. BNP: Invalid input(s): POCBNP CBG: No results for input(s): GLUCAP in the last 168 hours. D-Dimer No results for input(s): DDIMER in the last 72 hours. Hgb A1c No results for input(s): HGBA1C in the last 72 hours. Lipid Profile No results for input(s): CHOL, HDL, LDLCALC, TRIG, CHOLHDL, LDLDIRECT in the last 72 hours. Thyroid function studies No results for input(s): TSH, T4TOTAL, T3FREE, THYROIDAB in the last 72 hours.  Invalid input(s):  FREET3 Anemia work up No results for input(s): VITAMINB12, FOLATE, FERRITIN, TIBC, IRON, RETICCTPCT in the last 72 hours. Urinalysis    Component Value Date/Time   COLORURINE  yellow 08/29/2009 1122   APPEARANCEUR Hazy 08/29/2009 1122   LABSPEC 1.010 08/29/2009 1122   PHURINE 6.0 08/29/2009 1122   HGBUR large 08/29/2009 1122   BILIRUBINUR Negative 09/29/2018 1149   KETONESUR negative 09/16/2018 0920   PROTEINUR Negative 09/29/2018 1149   UROBILINOGEN 0.2 09/29/2018 1149   UROBILINOGEN 0.2 08/29/2009 1122   NITRITE Negative 09/29/2018 1149   NITRITE negative 08/29/2009 1122   LEUKOCYTESUR Small (1+) (A) 09/29/2018 1149   Sepsis Labs Invalid input(s): PROCALCITONIN,  WBC,  LACTICIDVEN Microbiology Recent Results (from the past 240 hour(s))  Resp Panel by RT-PCR (Flu A&B, Covid) Nasopharyngeal Swab     Status: None   Collection Time: 03/18/21  9:23 AM   Specimen: Nasopharyngeal Swab; Nasopharyngeal(NP) swabs in vial transport medium  Result Value Ref Range Status   SARS Coronavirus 2 by RT PCR NEGATIVE NEGATIVE Final    Comment: (NOTE) SARS-CoV-2 target nucleic acids are NOT DETECTED.  The SARS-CoV-2 RNA is generally detectable in upper respiratory specimens during the acute phase of infection. The lowest concentration of SARS-CoV-2 viral copies this assay can detect is 138 copies/mL. A negative result does not preclude SARS-Cov-2 infection and should not be used as the sole basis for treatment or other patient management decisions. A negative result may occur with  improper specimen collection/handling, submission of specimen other than nasopharyngeal swab, presence of viral mutation(s) within the areas targeted by this assay, and inadequate number of viral copies(<138 copies/mL). A negative result must be combined with clinical observations, patient history, and epidemiological information. The expected result is Negative.  Fact Sheet for Patients:   BloggerCourse.com  Fact Sheet for Healthcare Providers:  SeriousBroker.it  This test is no t yet approved or cleared by the [place] FDA and  has been authorized for detection and/or diagnosis of SARS-CoV-2 by FDA under an Emergency Use Authorization (EUA). This EUA will remain  in effect (meaning this test can be used) for the duration of the COVID-19 declaration under Section 564(b)(1) of the Act, 21 U.S.C.section 360bbb-3(b)(1), unless the authorization is terminated  or revoked sooner.       Influenza A by PCR NEGATIVE NEGATIVE Final   Influenza B by PCR NEGATIVE NEGATIVE Final    Comment: (NOTE) The Xpert Xpress SARS-CoV-2/FLU/RSV plus assay is intended as an aid in the diagnosis of influenza from Nasopharyngeal swab specimens and should not be used as a sole basis for treatment. Nasal washings and aspirates are unacceptable for Xpert Xpress SARS-CoV-2/FLU/RSV testing.  Fact Sheet for Patients: BloggerCourse.com  Fact Sheet for Healthcare Providers: SeriousBroker.it  This test is not yet approved or cleared by the [place] FDA and has been authorized for detection and/or diagnosis of SARS-CoV-2 by FDA under an Emergency Use Authorization (EUA). This EUA will remain in effect (meaning this test can be used) for the duration of the COVID-19 declaration under Section 564(b)(1) of the Act, 21 U.S.C. section 360bbb-3(b)(1), unless the authorization is terminated or revoked.  Performed at [facility], [address] [place], [place] [address]     Time coordinating discharge: Over 30 minutes  SIGNED:  Arnetha Courser, MD  [facility] 03/23/2021, 12:55 PM  If 7PM-7AM, please contact night-coverage [email or web address]  This record has been created using Conservation officer, historic buildings. Errors have been sought and corrected,but may not always be  located. Such creation errors do not reflect on the standard of care.

## 2021-03-23 NOTE — Progress Notes (Signed)
Physical Therapy Treatment Patient Details Name: Tracy Morales MRN: [id number] DOB: 1919-12-19 Today's Date: 03/23/2021   History of Present Illness Tracy Morales is a 87 y.o. female with multiple medical problem, including hypertension, hyperlipidemia, peripheral vascular disease, chronic kidney disease, cerebrovascular disease, polymyalgia rheumatica, and mild depression who lives with her daughter who presented to the ED on 10/15 after a fall. Pt to underwent R reduction and internal fixation of displaced proximal femoral shaft/subtrochanteric right hip fracture with TFN nail with Dr. Joice Lofts 10/15.    PT Comments    Pt resting in bed upon arrival with c/o R knee pain. Pt's B LE's repositioned to help reduce stiffness from prolonged resting position. Pt with increased c/o discomfort with light touch throughout anterior thigh. Ice applied to pt's R hip and knee with reduction of pain. Pt monitored several times. Currently resting peacefully without c/o pain. Pt's frequency has been decreased to 2x/week at this time.   Recommendations for follow up therapy are one component of a multi-disciplinary discharge planning process, led by the attending physician.  Recommendations may be updated based on patient status, additional functional criteria and insurance authorization.  Follow Up Recommendations  SNF     Equipment Recommendations  None recommended by PT    Recommendations for Other Services OT consult     Precautions / Restrictions Restrictions Weight Bearing Restrictions: Yes RLE Weight Bearing: Weight bearing as tolerated     Mobility  Bed Mobility                    Transfers                    Ambulation/Gait                 Stairs             Wheelchair Mobility    Modified Rankin (Stroke Patients Only)       Balance                                            Cognition Arousal/Alertness:  Awake/alert;Lethargic;Suspect due to medications Behavior During Therapy: [facility] for tasks assessed/performed Overall Cognitive Status: Within Functional Limits for tasks assessed                                        Exercises      General Comments General comments (skin integrity, edema, etc.): R thigh remains swollen. Pt c/o increased pain this am. Session focused on pain releif and repositioning since pt was grimmacing with light touch. Nursing notified.      Pertinent Vitals/Pain Pain Assessment: Faces Faces Pain Scale: Hurts whole lot Pain Location: R LE with light palpation/gentle movement Pain Descriptors / Indicators: Grimacing;Guarding;Shooting Pain Intervention(s): Limited activity within patient's tolerance;Repositioned;RN gave pain meds during session;Relaxation;Utilized relaxation techniques;Ice applied    Home Living                      Prior Function            PT Goals (current goals can now be found in the care plan section) Acute Rehab PT Goals Patient Stated Goal: to get back home    Frequency    Min 2X/week  PT Plan Current plan remains appropriate    Co-evaluation              AM-PAC PT "6 Clicks" Mobility   Outcome Measure  Help needed turning from your back to your side while in a flat bed without using bedrails?: A Lot Help needed moving from lying on your back to sitting on the side of a flat bed without using bedrails?: Total Help needed moving to and from a bed to a chair (including a wheelchair)?: Total Help needed standing up from a chair using your arms (e.g., wheelchair or bedside chair)?: A Lot Help needed to walk in hospital room?: Total Help needed climbing 3-5 steps with a railing? : Total 6 Click Score: 8    End of Session   Activity Tolerance: Patient limited by pain Patient left: in bed;with call bell/phone within reach;with bed alarm set;with SCD's reapplied Nurse Communication: Patient  requests pain meds;Mobility status PT Visit Diagnosis: Unsteadiness on feet (R26.81);Repeated falls (R29.6);Muscle weakness (generalized) (M62.81)     Time: 1010-1025 PT Time Calculation (min) (ACUTE ONLY): 15 min  Charges:  $Therapeutic Activity: 8-22 mins                    Zadie Cleverly, PTA    Jannet Askew 03/23/2021, 12:34 PM

## 2021-03-23 NOTE — Progress Notes (Signed)
  Subjective: 5 Days Post-Op Procedure(s) (LRB): INTRAMEDULLARY (IM) NAIL FEMORAL (Right) Patient doing well. Complains of right hip pain only with movement. Patient is well, and has had no acute complaints or problems Negative for chest pain and shortness of breath Fever: no Gastrointestinal: negative for nausea and vomiting.  Patient has had a BM.   Objective: Vital signs in last 24 hours: Temp:  [97.9 F (36.6 C)-99.3 F (37.4 C)] 98.7 F (37.1 C) (10/20 0745) Pulse Rate:  [71-86] 71 (10/20 0747) Resp:  [16-18] 18 (10/20 0440) BP: (99-138)/(47-97) 128/53 (10/20 0747) SpO2:  [96 %-100 %] 97 % (10/20 0747)  Intake/Output from previous day: No intake or output data in the 24 hours ending 03/23/21 0831   Intake/Output this shift: No intake/output data recorded.  Labs: No results for input(s): HGB in the last 72 hours.  No results for input(s): WBC, RBC, HCT, PLT in the last 72 hours.  No results for input(s): NA, K, CL, CO2, BUN, CREATININE, GLUCOSE, CALCIUM in the last 72 hours.  No results for input(s): LABPT, INR in the last 72 hours.  EXAM General - Patient is Alert and Appropriate Extremity - Neurovascular intact Dorsiflexion/Plantar flexion intact Compartment soft Dressing/Incision -clean, dry, no drainage noted to the right leg. Motor Function - intact, moving foot and toes well on exam.  Abdomen soft, non tender, non-distended   Assessment/Plan: 5 Days Post-Op Procedure(s) (LRB): INTRAMEDULLARY (IM) NAIL FEMORAL (Right) Principal Problem:   Hip fracture (HCC) Active Problems:   Hyperlipidemia   Essential hypertension   CKD stage IIIb (HCC)   DNR (do not resuscitate)   ABLA   Malnutrition of moderate degree  Estimated body mass index is 21.19 kg/m as calculated from the following:   Height as of this encounter: 5\' 4"  (1.626 m).   Weight as of this encounter: 56 kg. Advance diet Up with therapy  Vitals are stable this AM. Plan for discharge to  SNF when appropriate.  Upon discharge, staple can be removed on 04/01/21 by SNF. Follow-up with [facility] in 6 weeks for x-rays of the right femur.  DVT Prophylaxis - Lovenox and SCDs Weight-Bearing as tolerated to right leg  [facility], PA-C [facility] 03/23/2021, 8:31 AM

## 2021-03-24 DIAGNOSIS — I639 Cerebral infarction, unspecified: Secondary | ICD-10-CM | POA: Diagnosis not present

## 2021-03-24 DIAGNOSIS — N1831 Chronic kidney disease, stage 3a: Secondary | ICD-10-CM | POA: Diagnosis not present

## 2021-03-24 DIAGNOSIS — E785 Hyperlipidemia, unspecified: Secondary | ICD-10-CM | POA: Diagnosis not present

## 2021-03-24 DIAGNOSIS — D649 Anemia, unspecified: Secondary | ICD-10-CM | POA: Diagnosis not present

## 2021-03-24 DIAGNOSIS — S72001A Fracture of unspecified part of neck of right femur, initial encounter for closed fracture: Secondary | ICD-10-CM | POA: Diagnosis not present

## 2021-03-24 DIAGNOSIS — I10 Essential (primary) hypertension: Secondary | ICD-10-CM | POA: Diagnosis not present

## 2021-03-24 NOTE — Care Management Important Message (Signed)
Important Message  Patient Details  Name: Tracy Morales MRN: [id number] Date of Birth: 1920-01-29   Medicare Important Message Given:  Yes  Late entry:  Tracy Morales, daughter called back and she is in agreement with the discharge plan. I thanked her for her time.   [name] 03/24/2021, 9:48 AM

## 2021-03-27 DIAGNOSIS — I10 Essential (primary) hypertension: Secondary | ICD-10-CM | POA: Diagnosis not present

## 2021-03-27 DIAGNOSIS — S72001A Fracture of unspecified part of neck of right femur, initial encounter for closed fracture: Secondary | ICD-10-CM | POA: Diagnosis not present

## 2021-03-27 DIAGNOSIS — D649 Anemia, unspecified: Secondary | ICD-10-CM | POA: Diagnosis not present

## 2021-03-27 DIAGNOSIS — E785 Hyperlipidemia, unspecified: Secondary | ICD-10-CM | POA: Diagnosis not present

## 2021-03-27 DIAGNOSIS — I639 Cerebral infarction, unspecified: Secondary | ICD-10-CM | POA: Diagnosis not present

## 2021-03-27 DIAGNOSIS — N1831 Chronic kidney disease, stage 3a: Secondary | ICD-10-CM | POA: Diagnosis not present

## 2021-03-29 DIAGNOSIS — D649 Anemia, unspecified: Secondary | ICD-10-CM | POA: Diagnosis not present

## 2021-03-29 DIAGNOSIS — I10 Essential (primary) hypertension: Secondary | ICD-10-CM | POA: Diagnosis not present

## 2021-03-29 DIAGNOSIS — S72001A Fracture of unspecified part of neck of right femur, initial encounter for closed fracture: Secondary | ICD-10-CM | POA: Diagnosis not present

## 2021-03-29 DIAGNOSIS — I639 Cerebral infarction, unspecified: Secondary | ICD-10-CM | POA: Diagnosis not present

## 2021-03-29 DIAGNOSIS — N1831 Chronic kidney disease, stage 3a: Secondary | ICD-10-CM | POA: Diagnosis not present

## 2021-03-29 DIAGNOSIS — E785 Hyperlipidemia, unspecified: Secondary | ICD-10-CM | POA: Diagnosis not present

## 2021-03-30 DIAGNOSIS — E785 Hyperlipidemia, unspecified: Secondary | ICD-10-CM | POA: Diagnosis not present

## 2021-03-30 DIAGNOSIS — S72001A Fracture of unspecified part of neck of right femur, initial encounter for closed fracture: Secondary | ICD-10-CM | POA: Diagnosis not present

## 2021-03-30 DIAGNOSIS — I10 Essential (primary) hypertension: Secondary | ICD-10-CM | POA: Diagnosis not present

## 2021-03-30 DIAGNOSIS — I69891 Dysphagia following other cerebrovascular disease: Secondary | ICD-10-CM | POA: Diagnosis not present

## 2021-03-30 DIAGNOSIS — N1831 Chronic kidney disease, stage 3a: Secondary | ICD-10-CM | POA: Diagnosis not present

## 2021-03-30 DIAGNOSIS — D649 Anemia, unspecified: Secondary | ICD-10-CM | POA: Diagnosis not present

## 2021-04-03 DIAGNOSIS — D649 Anemia, unspecified: Secondary | ICD-10-CM | POA: Diagnosis not present

## 2021-04-03 DIAGNOSIS — I639 Cerebral infarction, unspecified: Secondary | ICD-10-CM | POA: Diagnosis not present

## 2021-04-03 DIAGNOSIS — S72001A Fracture of unspecified part of neck of right femur, initial encounter for closed fracture: Secondary | ICD-10-CM | POA: Diagnosis not present

## 2021-04-03 DIAGNOSIS — N1831 Chronic kidney disease, stage 3a: Secondary | ICD-10-CM | POA: Diagnosis not present

## 2021-04-03 DIAGNOSIS — E785 Hyperlipidemia, unspecified: Secondary | ICD-10-CM | POA: Diagnosis not present

## 2021-04-03 DIAGNOSIS — I10 Essential (primary) hypertension: Secondary | ICD-10-CM | POA: Diagnosis not present

## 2021-04-06 DIAGNOSIS — L89153 Pressure ulcer of sacral region, stage 3: Secondary | ICD-10-CM | POA: Diagnosis not present

## 2021-04-07 DIAGNOSIS — I10 Essential (primary) hypertension: Secondary | ICD-10-CM | POA: Diagnosis not present

## 2021-04-07 DIAGNOSIS — S72001A Fracture of unspecified part of neck of right femur, initial encounter for closed fracture: Secondary | ICD-10-CM | POA: Diagnosis not present

## 2021-04-07 DIAGNOSIS — I639 Cerebral infarction, unspecified: Secondary | ICD-10-CM | POA: Diagnosis not present

## 2021-04-07 DIAGNOSIS — D649 Anemia, unspecified: Secondary | ICD-10-CM | POA: Diagnosis not present

## 2021-04-07 DIAGNOSIS — N1831 Chronic kidney disease, stage 3a: Secondary | ICD-10-CM | POA: Diagnosis not present

## 2021-04-07 DIAGNOSIS — E785 Hyperlipidemia, unspecified: Secondary | ICD-10-CM | POA: Diagnosis not present

## 2021-04-09 ENCOUNTER — Other Ambulatory Visit: Payer: Self-pay | Admitting: Family Medicine

## 2021-04-11 ENCOUNTER — Encounter: Payer: Self-pay | Admitting: Family Medicine

## 2021-04-11 ENCOUNTER — Other Ambulatory Visit: Payer: Self-pay

## 2021-04-11 ENCOUNTER — Ambulatory Visit: Payer: Medicare PPO | Admitting: Family Medicine

## 2021-04-11 ENCOUNTER — Telehealth: Payer: Self-pay | Admitting: Family Medicine

## 2021-04-11 VITALS — BP 126/82 | HR 74 | Temp 97.5°F | Ht 64.0 in | Wt 123.0 lb

## 2021-04-11 DIAGNOSIS — N1832 Chronic kidney disease, stage 3b: Secondary | ICD-10-CM

## 2021-04-11 DIAGNOSIS — D500 Iron deficiency anemia secondary to blood loss (chronic): Secondary | ICD-10-CM

## 2021-04-11 DIAGNOSIS — L89309 Pressure ulcer of unspecified buttock, unspecified stage: Secondary | ICD-10-CM | POA: Diagnosis not present

## 2021-04-11 DIAGNOSIS — Z7409 Other reduced mobility: Secondary | ICD-10-CM | POA: Diagnosis not present

## 2021-04-11 DIAGNOSIS — D509 Iron deficiency anemia, unspecified: Secondary | ICD-10-CM | POA: Insufficient documentation

## 2021-04-11 DIAGNOSIS — S72009A Fracture of unspecified part of neck of unspecified femur, initial encounter for closed fracture: Secondary | ICD-10-CM

## 2021-04-11 DIAGNOSIS — R339 Retention of urine, unspecified: Secondary | ICD-10-CM | POA: Diagnosis not present

## 2021-04-11 DIAGNOSIS — I10 Essential (primary) hypertension: Secondary | ICD-10-CM

## 2021-04-11 DIAGNOSIS — L8990 Pressure ulcer of unspecified site, unspecified stage: Secondary | ICD-10-CM | POA: Insufficient documentation

## 2021-04-11 NOTE — Assessment & Plan Note (Signed)
From mechanical fall, with ORIF on 03/18/21 Doing well now home from rehab  Reviewed hospital records, lab results and studies in detail  Lab for bmet and cbc ordered  Planning f/u with orthopedics Staples removed from lateral lower thigh today after consent obtained (pt tolerated well and wound inst reviewed) Getting home care and soon to start with PT/ OT  Needs hospital bed now in light of need for frequent repositioning and pressure ulcer (with risk of more) Pt needs asst to do everything Disc imp of good hydration and also increased calories (using ensure)

## 2021-04-11 NOTE — Telephone Encounter (Signed)
Generally orthopedics wants to do that so they can check her incision. If that is not the case and orthopedics wants me to do that, please let me know.   If she comes here please let them know it is [place].  I hope her recovery is going well.

## 2021-04-11 NOTE — Assessment & Plan Note (Signed)
Worse after hip surgery  She takes chewable ferrous sulfate 325 mg bid and tolerates well so far

## 2021-04-11 NOTE — Assessment & Plan Note (Signed)
Labs today  S/p R hip fracture with ORIF Caregivers struggle to improve her oral fluid intake  Disc s/s of dehydration   Also due to f/u with urology re: foley cath

## 2021-04-11 NOTE — Telephone Encounter (Addendum)
[facility] need wound care orders for pt to take staples out. Pt has a catheter that was placed on nov.1st and urology supposed to follow-up. Osvaldo Human want to see if provider can take staples out of pt because that would be helpful.

## 2021-04-11 NOTE — Assessment & Plan Note (Signed)
Prior to recent hip fracture and surgery pt had to in/out cath  Now has foley and unable to physically cath herself  Planning urology f/u to discuss management of foley catheter  inst family to reach out to [place] if help is needed getting that appt Is at risk for uti

## 2021-04-11 NOTE — Assessment & Plan Note (Signed)
Pt has need for hospital bed s/p R hip fx with ORIF PT/OT to work with her in the future Unsure if she will get strong enough to ambulate  Has [facility] bed order/px given

## 2021-04-11 NOTE — Telephone Encounter (Signed)
[facility] requesting wound care not womb to remove staples Please advise.

## 2021-04-11 NOTE — Telephone Encounter (Signed)
Spoke to [place] and was advised that patient has been in rehab for 5 weeks. Osvaldo Human stated that the drain staples 6 inches apart were missed. Osvaldo Human stated that patient is seeing Dr. Milinda Antis today and wanted her to be aware of this.

## 2021-04-11 NOTE — Patient Instructions (Addendum)
I see an appt. On 05/05/21 with Dr Joice Lofts at [facility] Please call to verify and confirm  Tracy Morales will check for you   Call and get a urology appt when you can  Leave foley in until then   Proceed with PT and OT as tolerated  I ordered the hospital bed and supplies   Labs today

## 2021-04-11 NOTE — Assessment & Plan Note (Signed)
With home care this is improved  Pt needs hospital bed to help caregivers re position her  Alternating pressure pad and pump may help reduce risk Disc imp of offloading areas and freq re positioning

## 2021-04-11 NOTE — Assessment & Plan Note (Signed)
bp is stable through and after hospitalization   BP Readings from Last 3 Encounters:  04/11/21 126/82  03/23/21 (!) 118/54  05/20/20 138/70   Plan to continue amlodipine 5 mg daily  Aiming to improve hydration  Labs ordered today, bmet

## 2021-04-11 NOTE — Progress Notes (Signed)
Subjective:    Patient ID: Tracy DerryAnnie S Morales, female    DOB: 1919-12-13, 87 y.o.   MRN: [id number]  This visit occurred during the SARS-CoV-2 public health emergency.  Safety protocols were in place, including screening questions prior to the visit, additional usage of staff PPE, and extensive cleaning of exam room while observing appropriate contact time as indicated for disinfecting solutions.   HPI Pt presents for f/u after hospitalization and rehab stay for a hip fracture  Wt Readings from Last 3 Encounters:  04/11/21 123 lb (55.8 kg)  03/18/21 123 lb 7.3 oz (56 kg)  05/20/20 122 lb 1 oz (55.4 kg)   21.11 kg/m Did not eat in the hospital   Pt presented to the ER on 03/18/21 after a mechanical fall causing R hip fracture (closed displaced transverse fracture of shaft of R femur)  Came home Sunday the 6th  Has perked up since she came home   Needs a hospital bed  Needs cloth pads for it/sheets  Needs incontinence supplies   One bed sore-is getting better  Does not like to sit up for long   Will be starting PT/OT and needs help with bathing   Cannot self cath -so still has foley Needs to get appt for urology     Repaired with ORIF which she tolerated well  Was due for staple removal on 10/29-? Supposed to be done in the rehab [facility]   F/u with orthop surgery is planned for 4-6 weeks  (December 2) Dr Joice LoftsPoggi  Had enoxaparin injections post surgery for DVT prophylaxis  Went off those already    Norco 5-325 mg for pain prn Also tylenol prn   Appetite/ po intake  Ensure - at least one  Does eat a good breakfast some mornings Some soup today  Needs to get fluid intake up - family has pushed fluids    She had a foley cath (was performing in/out cath at home before this for chronic urinary retention) She has urology f/u to remove this  CKD stage 3b remiained stable during her hospital stay   HTN bp is stable today  No cp or palpitations or headaches or  edema  No side effects to medicines  BP Readings from Last 3 Encounters:  04/11/21 126/82  03/23/21 (!) 118/54  05/20/20 138/70     Amlodipine 5 mg daily   Post op Lab Results  Component Value Date   WBC 8.2 03/20/2021   HGB 9.3 (L) 03/20/2021   HCT 27.6 (L) 03/20/2021   MCV 89.0 03/20/2021   PLT 197 03/20/2021   Lab Results  Component Value Date   CREATININE 1.52 (H) 03/19/2021   BUN 28 (H) 03/19/2021   NA 136 03/19/2021   K 4.5 03/19/2021   CL 104 03/19/2021   CO2 26 03/19/2021   Iron she is on-does not constipate (chewable)   Patient Active Problem List   Diagnosis Date Noted   Urinary retention 04/11/2021   Bed sore on buttock 04/11/2021   Iron deficiency anemia 04/11/2021   Malnutrition of moderate degree 03/20/2021   ABLA 03/19/2021   Hip fracture (HCC) 03/18/2021   DNR (do not resuscitate) 03/18/2021   Closed displaced transverse fracture of shaft of right femur (HCC)    Fall    COVID-19 01/19/2021   Pincer nail deformity 07/04/2020   Phlebitis 05/20/2020   Left leg pain 02/19/2020   Ankle sprain 01/15/2020   Seborrheic dermatitis of scalp 08/27/2019   Elevated random  blood glucose level 05/11/2019   Pain due to onychomycosis of toenails of both feet 12/04/2018   Recurrent UTI 09/22/2018   Low back pain 09/15/2018   MCI (mild cognitive impairment) 05/11/2018   Frequent urination 02/28/2018   Mobility impaired 05/06/2017   Osteoarthritis of both hips 04/15/2015   Hip pain 04/14/2015   Encounter for general adult medical examination with abnormal findings 04/12/2015   Hair loss 11/19/2014   Loss of weight 06/09/2014   Arthritis of right shoulder region 06/09/2014   Nodule of left lung 06/09/2014   Encounter for Medicare annual wellness exam 10/30/2013   H/O fall 04/27/2013   Risk for falls 04/11/2012   Leg pain, bilateral 01/29/2012   MICROSCOPIC HEMATURIA 08/29/2009   CONDUCTIVE HEARING LOSS BILATERAL 08/08/2009   EDEMA 04/07/2009    BRADYCARDIA 10/26/2008   DYSPNEA 10/26/2008   INCONTINENCE, URGE 02/27/2008   Hyperlipidemia 10/11/2006   PERIPHERAL NEUROPATHY, LOWER EXTREMITIES, BILATERAL 10/11/2006   Essential hypertension 123XX123   DIASTOLIC DYSFUNCTION 123XX123   CAROTID STENOSIS 10/11/2006   CEREBROVASCULAR DISEASE 10/11/2006   PERIPHERAL VASCULAR DISEASE 10/11/2006   ALLERGIC RHINITIS 10/11/2006   CKD stage IIIb ([place]) 10/11/2006   OVERACTIVE BLADDER 10/11/2006   OSTEOARTHRITIS 10/11/2006   CARDIAC MURMUR, HX OF 10/11/2006   Past Medical History:  Diagnosis Date   Allergic rhinitis, cause unspecified    Bradycardia    Cerebrovascular disease, unspecified    Chronic kidney disease, unspecified    chronic renal insufficiency   Dizziness and giddiness    DVT (deep venous thrombosis) (HCC)    R   Dyspnea    echo with normal EF. 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   HTN (hypertension)    Hyperlipidemia    Hypertonicity of bladder    hx   Mild depression    Mononeuritis of lower limb, unspecified    peripheral neuropathy, bilateral   Occlusion and stenosis of carotid artery without mention of cerebral infarction 09/03/2007   u/s L 40-59%, R 0-39%; stable for many years   Osteoarthrosis, unspecified whether generalized or localized, unspecified site    Peripheral vascular disease, unspecified ([place])    Polymyalgia rheumatica ([place])    hx   Past Surgical History:  Procedure Laterality Date   2D echo  8/06   mild aortic stenosis.    arterial doppler     carotid doppler  4/02   bilateral plaque    carotid doppler  8/06   non significant stenosis   carotid doppler     R 40%; L 40-60%   CATARACT EXTRACTION     CHOLECYSTECTOMY  2000   COLONOSCOPY  1/04   polyps   CT SCAN  05/25/08   abd and pelvis- inflated urinary bladder and large amt stool throughout w/o blockage    dexa  2/01   osteopenia   echo     mild LVH EF 50% As   FEMUR IM NAIL Right 03/18/2021    Procedure: INTRAMEDULLARY (IM) NAIL FEMORAL;  Surgeon: Corky Mull, MD;  Location: [facility] ORS;  Service: Orthopedics;  Laterality: Right;   triger finger contracture  6/04   vert. basilar insufficiency  2002   zoster  5/02   Social History   Tobacco Use   Smoking status: Never   Smokeless tobacco: Never   Tobacco comments:    non smoker   Vaping Use   Vaping Use: Never used  Substance Use Topics   Alcohol use: No  Alcohol/week: 0.0 standard drinks   Drug use: No   Family History  Problem Relation Age of Onset   Hypertension Father    Other Father        Cardiac problems   Heart disease Father    Other Brother        blood clot   Other Sister        benign breast nodule   Allergies  Allergen Reactions   Ace Inhibitors     REACTION: cough   Donepezil Diarrhea and Other (See Comments)    GI problems Other reaction(s): Other (See Comments) GI problems   Sulfa Antibiotics Nausea Only    headache   Current Outpatient Medications on File Prior to Visit  Medication Sig Dispense Refill   acetaminophen (TYLENOL) 500 MG tablet Take 500 mg by mouth every 6 (six) hours as needed. OTC-UAD     amLODipine (NORVASC) 5 MG tablet TAKE 1 TABLET BY MOUTH EVERY DAY 90 tablet 0   aspirin 325 MG EC tablet Take 325 mg by mouth daily.     bisacodyl (DULCOLAX) 10 MG suppository Place 1 suppository (10 mg total) rectally daily as needed for moderate constipation. 12 suppository 0   Cholecalciferol (VITAMIN D3) 2000 units capsule Take 2,000 Units by mouth daily.     ciclopirox (LOPROX) 0.77 % SUSP APPLY TO SCALP 3 TIMES WEEKLY AND LEAVE IN AS DIRECTED. NOT COVERED BY PLAN (Patient taking differently: Apply 1 application topically once a week. Usually on Saturdays) 60 mL 2   clobetasol (TEMOVATE) 0.05 % external solution APPLY TO SCALP ONCE DAILY AS NEEDED FOR ITCH. USE ON SCALP. AVOID FACE / GROIN / UNDERARM 50 mL 1   CRANBERRY PO Take 1 tablet by mouth daily.     docusate sodium (COLACE)  100 MG capsule Take 1 capsule (100 mg total) by mouth 2 (two) times daily. 10 capsule 0   feeding supplement (ENSURE ENLIVE / ENSURE PLUS) LIQD Take 237 mLs by mouth 2 (two) times daily between meals. 237 mL 12   ferrous sulfate 325 (65 FE) MG tablet Take 1 tablet (325 mg total) by mouth 2 (two) times daily. 60 tablet 11   Fluocinolone Acetonide Body 0.01 % OIL Apply 1 application topically daily. 120 mL 5   HYDROcodone-acetaminophen (NORCO/VICODIN) 5-325 MG tablet Take 0.5-1 tablets by mouth every 6 (six) hours as needed for moderate pain. 30 tablet 0   hydrocortisone 2.5 % cream apply to affected area on Eyeb[place] twice daily for up to 2 week. Use on FACE 28 g 3   ketoconazole (NIZORAL) 2 % shampoo MESSAGE INTO SCALP 3 TIMES A WEEK, LEAVE ON FOR 10 MINUTES (Patient taking differently: Apply 1 application topically once a week. Usually on Wednesdays) 120 mL 2   Meclizine HCl (BONINE PO) Take 50 mg by mouth as needed.     mirtazapine (REMERON) 30 MG tablet TAKE 1 TABLET BY MOUTH EVERYDAY AT BEDTIME (Patient taking differently: Take 30 mg by mouth at bedtime.) 90 tablet 1   polyethylene glycol (MIRALAX / GLYCOLAX) 17 g packet Take 17 g by mouth 2 (two) times daily. 14 each 0   psyllium (METAMUCIL) 58.6 % packet Take 1 packet by mouth daily as needed.     simvastatin (ZOCOR) 20 MG tablet TAKE 1 TABLET BY MOUTH EVERY DAY 90 tablet 0   No current facility-administered medications on file prior to visit.     Review of Systems  Constitutional:  Positive for appetite change and fatigue.  Negative for activity change, fever and unexpected weight change.  HENT:  Negative for congestion, ear pain, rhinorrhea, sinus pressure and sore throat.   Eyes:  Negative for pain, redness and visual disturbance.  Respiratory:  Negative for cough, shortness of breath and wheezing.        Poor exercise tolerance  deconditioned  Cardiovascular:  Negative for chest pain and palpitations.  Gastrointestinal:  Negative for  abdominal pain, blood in stool, constipation and diarrhea.  Endocrine: Negative for polydipsia and polyuria.  Genitourinary:  Negative for dysuria, frequency and urgency.  Musculoskeletal:  Positive for arthralgias. Negative for back pain and myalgias.       Healing hip fracture, right   Skin:  Negative for pallor and rash.  Allergic/Immunologic: Negative for environmental allergies.  Neurological:  Positive for weakness. Negative for dizziness, syncope and headaches.       Generalized weakness  Poor balance  Hematological:  Negative for adenopathy. Does not bruise/bleed easily.  Psychiatric/Behavioral:  Negative for decreased concentration and dysphoric mood. The patient is not nervous/anxious.       Objective:   Physical Exam Constitutional:      General: She is not in acute distress.    Appearance: Normal appearance. She is normal weight. She is not ill-appearing or diaphoretic.     Comments: Slim and frail appearing elderly female in wheelchair    HENT:     Head: Normocephalic and atraumatic.     Ears:     Comments: Hard of hearing     Mouth/Throat:     Mouth: Mucous membranes are moist.  Eyes:     Conjunctiva/sclera: Conjunctivae normal.     Pupils: Pupils are equal, round, and reactive to light.  Cardiovascular:     Rate and Rhythm: Normal rate and regular rhythm.     Heart sounds: Normal heart sounds.  Pulmonary:     Effort: Pulmonary effort is normal. No respiratory distress.     Breath sounds: Normal breath sounds. No stridor. No wheezing, rhonchi or rales.     Comments: Breath sounds are more distant at bases   Abdominal:     General: Abdomen is flat.  Musculoskeletal:     Cervical back: Neck supple. No tenderness.     Right lower leg: No edema.     Left lower leg: No edema.     Comments: Limited rom of R hip    Lymphadenopathy:     Cervical: No cervical adenopathy.  Skin:    Comments: After consent obtained, right lateral leg is cleaned and staples removed  without complication, pt tolerated well   Poor rom of R hip and knee Unable to bear weight on RLE  Site of prev pressure ulcer if R coccyx  Neurological:     Mental Status: She is alert.     Cranial Nerves: No cranial nerve deficit.     Sensory: No sensory deficit.     Motor: Weakness present.     Coordination: Coordination normal.     Comments: Pt in wheelchair  Needs help of multiple people to transfer  Cannot stand/bear weight by herself  Cannot stand from sitting even using arms of chair     Psychiatric:        Mood and Affect: Mood normal.     Comments: Quiet and content She answers questions appropriately           Assessment & Plan:   Problem List Items Addressed This Visit  Cardiovascular and Mediastinum   Essential hypertension    bp is stable through and after hospitalization   BP Readings from Last 3 Encounters:  04/11/21 126/82  03/23/21 (!) 118/54  05/20/20 138/70   Plan to continue amlodipine 5 mg daily  Aiming to improve hydration  Labs ordered today, bmet      Relevant Orders   Basic metabolic panel   CBC with Differential/Platelet     Musculoskeletal and Integument   Hip fracture ([place]) - Primary    From mechanical fall, with ORIF on 03/18/21 Doing well now home from rehab  Reviewed hospital records, lab results and studies in detail  Lab for bmet and cbc ordered  Planning f/u with orthopedics Staples removed from lateral lower thigh today after consent obtained (pt tolerated well and wound inst reviewed) Getting home care and soon to start with PT/ OT  Needs hospital bed now in light of need for frequent repositioning and pressure ulcer (with risk of more) Pt needs asst to do everything Disc imp of good hydration and also increased calories (using ensure)        Relevant Orders   For home use only DME Hospital bed     Genitourinary   CKD stage IIIb ([place])    Labs today  S/p R hip fracture with ORIF Caregivers struggle to  improve her oral fluid intake  Disc s/s of dehydration   Also due to f/u with urology re: foley cath       Relevant Orders   Basic metabolic panel   CBC with Differential/Platelet   Urinary retention    Prior to recent hip fracture and surgery pt had to in/out cath  Now has foley and unable to physically cath herself  Planning urology f/u to discuss management of foley catheter  inst family to reach out to [place] if help is needed getting that appt Is at risk for uti          Other   Mobility impaired    Pt has need for hospital bed s/p R hip fx with ORIF PT/OT to work with her in the future Unsure if she will get strong enough to ambulate  Has [facility] hosp bed order/px given       Relevant Orders   For home use only DME Hospital bed   Bed sore on buttock    With home care this is improved  Pt needs hospital bed to help caregivers re position her  Alternating pressure pad and pump may help reduce risk Disc imp of offloading areas and freq re positioning       Relevant Orders   For home use only DME Hospital bed   Iron deficiency anemia    Worse after hip surgery  She takes chewable ferrous sulfate 325 mg bid and tolerates well so far        Relevant Orders   CBC with Differential/Platelet   Iron

## 2021-04-12 ENCOUNTER — Telehealth: Payer: Self-pay | Admitting: Family Medicine

## 2021-04-12 DIAGNOSIS — S72009A Fracture of unspecified part of neck of unspecified femur, initial encounter for closed fracture: Secondary | ICD-10-CM

## 2021-04-12 DIAGNOSIS — Z7409 Other reduced mobility: Secondary | ICD-10-CM

## 2021-04-12 LAB — CBC WITH DIFFERENTIAL/PLATELET
Basophils Absolute: 0 10*3/uL (ref 0.0–0.1)
Basophils Relative: 0.7 % (ref 0.0–3.0)
Eosinophils Absolute: 0.2 10*3/uL (ref 0.0–0.7)
Eosinophils Relative: 2.9 % (ref 0.0–5.0)
HCT: 31.5 % — ABNORMAL LOW (ref 36.0–46.0)
Hemoglobin: 10.1 g/dL — ABNORMAL LOW (ref 12.0–15.0)
Lymphocytes Relative: 28.8 % (ref 12.0–46.0)
Lymphs Abs: 1.6 10*3/uL (ref 0.7–4.0)
MCHC: 31.9 g/dL (ref 30.0–36.0)
MCV: 90.4 fl (ref 78.0–100.0)
Monocytes Absolute: 0.6 10*3/uL (ref 0.1–1.0)
Monocytes Relative: 11.9 % (ref 3.0–12.0)
Neutro Abs: 3 10*3/uL (ref 1.4–7.7)
Neutrophils Relative %: 55.7 % (ref 43.0–77.0)
Platelets: 367 10*3/uL (ref 150.0–400.0)
RBC: 3.49 Mil/uL — ABNORMAL LOW (ref 3.87–5.11)
RDW: 16.3 % — ABNORMAL HIGH (ref 11.5–15.5)
WBC: 5.4 10*3/uL (ref 4.0–10.5)

## 2021-04-12 LAB — BASIC METABOLIC PANEL
BUN: 36 mg/dL — ABNORMAL HIGH (ref 6–23)
CO2: 26 mEq/L (ref 19–32)
Calcium: 8.9 mg/dL (ref 8.4–10.5)
Chloride: 102 mEq/L (ref 96–112)
Creatinine, Ser: 1.32 mg/dL — ABNORMAL HIGH (ref 0.40–1.20)
GFR: 33.35 mL/min — ABNORMAL LOW (ref >60.00)
Glucose, Bld: 108 mg/dL — ABNORMAL HIGH (ref 70–99)
Potassium: 4.4 mEq/L (ref 3.5–5.1)
Sodium: 136 mEq/L (ref 135–145)

## 2021-04-12 LAB — IRON: Iron: 53 ug/dL (ref 42–145)

## 2021-04-12 NOTE — Telephone Encounter (Signed)
Please ok that verbal order  

## 2021-04-12 NOTE — Telephone Encounter (Signed)
Called and LVM on Gordon's phone for [facility] approval of OT/PT verbal orders. Stated that if he needs anything else from [place] he can give [place] a call back.

## 2021-04-12 NOTE — Telephone Encounter (Signed)
The number of days she takes that after surgery is up to her orthopedic doctor.  The course can go as long as 35 days.  Thanks for letting me know because they indicated in the office that she was done with it

## 2021-04-12 NOTE — Telephone Encounter (Signed)
Pt daughter called stating that pt is already taking something blood thinner. Pt daughter is asking should pt be taking enoxaparin 40 mg. Please advise.

## 2021-04-12 NOTE — Telephone Encounter (Signed)
Home Health verbal orders Caller Name:Gordon Agency Name: [facility]  Callback number: [phone]  Requesting OT/PT/Skilled nursing/Social Work/Speech:Physical Therapy  Reason:  Frequency: one time a week for nine weeks  Please forward to [facility] pool or providers CMA

## 2021-04-13 ENCOUNTER — Ambulatory Visit: Payer: Medicare PPO | Admitting: Podiatry

## 2021-04-13 NOTE — Telephone Encounter (Signed)
Pt daughter called in stated patient was seen by physical therapy and suggested pt need's chair lift and bed side table order with hospital bed . Please advise [phone]

## 2021-04-13 NOTE — Telephone Encounter (Signed)
Can't do until I get back in office in the am, let them know and I will route back to myself  Thanks

## 2021-04-14 NOTE — Telephone Encounter (Signed)
Done and in IN box 

## 2021-04-14 NOTE — Addendum Note (Signed)
Addended by: Roxy Manns A on: 04/14/2021 04:06 PM   Modules accepted: Orders

## 2021-04-17 ENCOUNTER — Encounter: Payer: Self-pay | Admitting: *Deleted

## 2021-04-18 NOTE — Telephone Encounter (Signed)
Spoke with daughter she will check with insurance to see what DME store her insurance uses and let me know

## 2021-04-18 NOTE — Telephone Encounter (Signed)
Went to [employer] and found a DME store in [place] that is covered.   [facility]  [address], [place], [place] [address] Phone: [phone]   Called and got their fax # and faxed orders over. Also left VM letting daughter Ms. Katrinka Blazing know that is faxed it and where it was faxed to.

## 2021-04-19 ENCOUNTER — Other Ambulatory Visit: Payer: Self-pay

## 2021-04-19 ENCOUNTER — Ambulatory Visit: Payer: Medicare PPO | Admitting: Family Medicine

## 2021-04-19 ENCOUNTER — Encounter: Payer: Self-pay | Admitting: Family Medicine

## 2021-04-19 DIAGNOSIS — T148XXA Other injury of unspecified body region, initial encounter: Secondary | ICD-10-CM | POA: Diagnosis not present

## 2021-04-19 NOTE — Progress Notes (Signed)
Subjective:    Patient ID: Tracy Morales, female    DOB: [birth year], 87 y.o.   MRN: [id number]  This visit occurred during the SARS-CoV-2 public health emergency.  Safety protocols were in place, including screening questions prior to the visit, additional usage of staff PPE, and extensive cleaning of exam room while observing appropriate contact time as indicated for disinfecting solutions.   HPI Pt presents with c/o lump on R arm  Wt Readings from Last 3 Encounters:  04/11/21 123 lb (55.8 kg)  03/18/21 123 lb 7.3 oz (56 kg)  05/20/20 122 lb 1 oz (55.4 kg)   Knot on R arm  Now just a big bruise  Family has to pick her up   Did not bother her   Has lovenox  Family is not comfortable doing it   Patient Active Problem List   Diagnosis Date Noted   Hematoma 04/19/2021   Urinary retention 04/11/2021   Bed sore on buttock 04/11/2021   Iron deficiency anemia 04/11/2021   Malnutrition of moderate degree 03/20/2021   ABLA 03/19/2021   Hip fracture ([place]) 03/18/2021   DNR (do not resuscitate) 03/18/2021   Closed displaced transverse fracture of shaft of right femur ([place])    Fall    COVID-19 01/19/2021   Pincer nail deformity 07/04/2020   Phlebitis 05/20/2020   Left leg pain 02/19/2020   Ankle sprain 01/15/2020   Seborrheic dermatitis of scalp 08/27/2019   Elevated random blood glucose level 05/11/2019   Pain due to onychomycosis of toenails of both feet 12/04/2018   Recurrent UTI 09/22/2018   Low back pain 09/15/2018   MCI (mild cognitive impairment) 05/11/2018   Frequent urination 02/28/2018   Mobility impaired 05/06/2017   Osteoarthritis of both hips 04/15/2015   Hip pain 04/14/2015   Encounter for general adult medical examination with abnormal findings 04/12/2015   Hair loss 11/19/2014   Loss of weight 06/09/2014   Arthritis of right shoulder region 06/09/2014   Nodule of left lung 06/09/2014   Encounter for Medicare annual wellness exam 10/30/2013   H/O fall  04/27/2013   Risk for falls 04/11/2012   Leg pain, bilateral 01/29/2012   MICROSCOPIC HEMATURIA 08/29/2009   CONDUCTIVE HEARING LOSS BILATERAL 08/08/2009   EDEMA 04/07/2009   BRADYCARDIA 10/26/2008   DYSPNEA 10/26/2008   INCONTINENCE, URGE 02/27/2008   Hyperlipidemia 10/11/2006   PERIPHERAL NEUROPATHY, LOWER EXTREMITIES, BILATERAL 10/11/2006   Essential hypertension 123XX123   DIASTOLIC DYSFUNCTION 123XX123   CAROTID STENOSIS 10/11/2006   CEREBROVASCULAR DISEASE 10/11/2006   PERIPHERAL VASCULAR DISEASE 10/11/2006   ALLERGIC RHINITIS 10/11/2006   CKD stage IIIb ([place]) 10/11/2006   OVERACTIVE BLADDER 10/11/2006   OSTEOARTHRITIS 10/11/2006   CARDIAC MURMUR, HX OF 10/11/2006   Past Medical History:  Diagnosis Date   Allergic rhinitis, cause unspecified    Bradycardia    Cerebrovascular disease, unspecified    Chronic kidney disease, unspecified    chronic renal insufficiency   Dizziness and giddiness    DVT (deep venous thrombosis) (HCC)    R   Dyspnea    echo with normal EF. 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   HTN (hypertension)    Hyperlipidemia    Hypertonicity of bladder    hx   Mild depression    Mononeuritis of lower limb, unspecified    peripheral neuropathy, bilateral   Occlusion and stenosis of carotid artery without mention of cerebral infarction 09/03/2007   u/s L 40-59%,  R 0-39%; stable for many years   Osteoarthrosis, unspecified whether generalized or localized, unspecified site    Peripheral vascular disease, unspecified (HCC)    Polymyalgia rheumatica (HCC)    hx   Past Surgical History:  Procedure Laterality Date   2D echo  8/06   mild aortic stenosis.    arterial doppler     carotid doppler  4/02   bilateral plaque    carotid doppler  8/06   non significant stenosis   carotid doppler     R 40%; L 40-60%   CATARACT EXTRACTION     CHOLECYSTECTOMY  2000   COLONOSCOPY  1/04   polyps   CT SCAN  05/25/08    abd and pelvis- inflated urinary bladder and large amt stool throughout w/o blockage    dexa  2/01   osteopenia   echo     mild LVH EF 50% As   FEMUR IM NAIL Right 03/18/2021   Procedure: INTRAMEDULLARY (IM) NAIL FEMORAL;  Surgeon: Christena Flake, MD;  Location: [facility] ORS;  Service: Orthopedics;  Laterality: Right;   triger finger contracture  6/04   vert. basilar insufficiency  2002   zoster  5/02   Social History   Tobacco Use   Smoking status: Never   Smokeless tobacco: Never   Tobacco comments:    non smoker   Vaping Use   Vaping Use: Never used  Substance Use Topics   Alcohol use: No    Alcohol/week: 0.0 standard drinks   Drug use: No   Family History  Problem Relation Age of Onset   Hypertension Father    Other Father        Cardiac problems   Heart disease Father    Other Brother        blood clot   Other Sister        benign breast nodule   Allergies  Allergen Reactions   Ace Inhibitors     REACTION: cough   Donepezil Diarrhea and Other (See Comments)    GI problems Other reaction(s): Other (See Comments) GI problems   Sulfa Antibiotics Nausea Only    headache   Current Outpatient Medications on File Prior to Visit  Medication Sig Dispense Refill   acetaminophen (TYLENOL) 500 MG tablet Take 500 mg by mouth every 6 (six) hours as needed. OTC-UAD     amLODipine (NORVASC) 5 MG tablet TAKE 1 TABLET BY MOUTH EVERY DAY 90 tablet 0   aspirin 325 MG EC tablet Take 325 mg by mouth daily.     bisacodyl (DULCOLAX) 10 MG suppository Place 1 suppository (10 mg total) rectally daily as needed for moderate constipation. 12 suppository 0   Cholecalciferol (VITAMIN D3) 2000 units capsule Take 2,000 Units by mouth daily.     ciclopirox (LOPROX) 0.77 % SUSP APPLY TO SCALP 3 TIMES WEEKLY AND LEAVE IN AS DIRECTED. NOT COVERED BY PLAN (Patient taking differently: Apply 1 application topically once a week. Usually on Saturdays) 60 mL 2   clobetasol (TEMOVATE) 0.05 % external  solution APPLY TO SCALP ONCE DAILY AS NEEDED FOR ITCH. USE ON SCALP. AVOID FACE / GROIN / UNDERARM 50 mL 1   CRANBERRY PO Take 1 tablet by mouth daily.     docusate sodium (COLACE) 100 MG capsule Take 1 capsule (100 mg total) by mouth 2 (two) times daily. 10 capsule 0   feeding supplement (ENSURE ENLIVE / ENSURE PLUS) LIQD Take 237 mLs by mouth 2 (two) times daily  between meals. 237 mL 12   ferrous sulfate 325 (65 FE) MG tablet Take 1 tablet (325 mg total) by mouth 2 (two) times daily. 60 tablet 11   Fluocinolone Acetonide Body 0.01 % OIL Apply 1 application topically daily. 120 mL 5   HYDROcodone-acetaminophen (NORCO/VICODIN) 5-325 MG tablet Take 0.5-1 tablets by mouth every 6 (six) hours as needed for moderate pain. 30 tablet 0   hydrocortisone 2.5 % cream apply to affected area on Eyebrows twice daily for up to 2 week. Use on FACE 28 g 3   ketoconazole (NIZORAL) 2 % shampoo MESSAGE INTO SCALP 3 TIMES A WEEK, LEAVE ON FOR 10 MINUTES (Patient taking differently: Apply 1 application topically once a week. Usually on Wednesdays) 120 mL 2   Meclizine HCl (BONINE PO) Take 50 mg by mouth as needed.     mirtazapine (REMERON) 30 MG tablet TAKE 1 TABLET BY MOUTH EVERYDAY AT BEDTIME (Patient taking differently: Take 30 mg by mouth at bedtime.) 90 tablet 1   polyethylene glycol (MIRALAX / GLYCOLAX) 17 g packet Take 17 g by mouth 2 (two) times daily. 14 each 0   psyllium (METAMUCIL) 58.6 % packet Take 1 packet by mouth daily as needed.     simvastatin (ZOCOR) 20 MG tablet TAKE 1 TABLET BY MOUTH EVERY DAY 90 tablet 0   No current facility-administered medications on file prior to visit.    Review of Systems  Constitutional:  Negative for activity change, appetite change, fatigue, fever and unexpected weight change.  HENT:  Negative for congestion, ear pain, rhinorrhea, sinus pressure and sore throat.   Eyes:  Negative for pain, redness and visual disturbance.  Respiratory:  Negative for cough, shortness of  breath and wheezing.   Cardiovascular:  Negative for chest pain and palpitations.  Gastrointestinal:  Negative for abdominal pain, blood in stool, constipation and diarrhea.  Endocrine: Negative for polydipsia and polyuria.  Genitourinary:  Negative for dysuria, frequency and urgency.  Musculoskeletal:  Positive for arthralgias. Negative for back pain and myalgias.  Skin:  Negative for pallor and rash.  Allergic/Immunologic: Negative for environmental allergies.  Neurological:  Negative for dizziness, syncope and headaches.       Generalized weakness  Hematological:  Negative for adenopathy. Does not bruise/bleed easily.  Psychiatric/Behavioral:  Negative for decreased concentration and dysphoric mood. The patient is not nervous/anxious.       Objective:   Physical Exam Constitutional:      General: She is not in acute distress.    Appearance: Normal appearance. She is normal weight. She is not ill-appearing.     Comments: Frail appearing elderly female in wheelchair  Eyes:     General:        Right eye: No discharge.        Left eye: No discharge.     Conjunctiva/sclera: Conjunctivae normal.     Pupils: Pupils are equal, round, and reactive to light.  Cardiovascular:     Rate and Rhythm: Normal rate and regular rhythm.     Heart sounds: Normal heart sounds.  Pulmonary:     Effort: Pulmonary effort is normal. No respiratory distress.     Breath sounds: Normal breath sounds. No wheezing.  Musculoskeletal:     Cervical back: Normal range of motion and neck supple. No tenderness.     Comments: 3-4 cm oval superficial lump (subcutaneous) under skin of upper RUE  Mobile and nt  Soft, not fluctuant  Surrounded by ecchymosis -down to elbow  Lymphadenopathy:     Cervical: No cervical adenopathy.  Skin:    Coloration: Skin is not pale.     Findings: Bruising present. No erythema.  Neurological:     Mental Status: She is alert.     Sensory: No sensory deficit.  Psychiatric:         Mood and Affect: Mood normal.          Assessment & Plan:   Problem List Items Addressed This Visit       Other   Hematoma    Superficial lump on R upper ext with ecchymosis consistent with hematoma  Not bothered and not tender May be from family lifting and transferring her  She has been on lovenox and then asa  Will obs inst to update if it grows in size or it if becomes painful or tender inst to use care with transfers as well

## 2021-04-19 NOTE — Patient Instructions (Addendum)
I think the lump is a hematoma (blood collection under the skin) and that developed a bruise Use caution around that spot  Watch for pain or increased size or any other new symptoms   Keep an eye on it  The bruise will fade before the lump gets smaller   Stop the lovenox and continue the aspirin

## 2021-04-19 NOTE — Assessment & Plan Note (Signed)
Superficial lump on R upper ext with ecchymosis consistent with hematoma  Not bothered and not tender May be from family lifting and transferring her  She has been on lovenox and then asa  Will obs inst to update if it grows in size or it if becomes painful or tender inst to use care with transfers as well

## 2021-04-20 ENCOUNTER — Encounter: Payer: Self-pay | Admitting: Surgery

## 2021-05-05 DIAGNOSIS — M25551 Pain in right hip: Secondary | ICD-10-CM | POA: Diagnosis not present

## 2021-05-09 ENCOUNTER — Telehealth: Payer: Self-pay | Admitting: Family Medicine

## 2021-05-09 DIAGNOSIS — N318 Other neuromuscular dysfunction of bladder: Secondary | ICD-10-CM

## 2021-05-09 DIAGNOSIS — S72009A Fracture of unspecified part of neck of unspecified femur, initial encounter for closed fracture: Secondary | ICD-10-CM

## 2021-05-09 DIAGNOSIS — R339 Retention of urine, unspecified: Secondary | ICD-10-CM

## 2021-05-09 NOTE — Telephone Encounter (Signed)
Daughter mentioned while here for something else that they would appreciate some help re: delivery of DME supplies (like for incontinence) and also to see if there is help with cost  Had a recent hip fracture  She has incontinence and also urinary retention /sees urology and has had to self cath in the past  Advanced age 87 yo    I placed referral to community health to see if some help or resources are available  Please let her daughter know she will get a call

## 2021-05-10 ENCOUNTER — Telehealth: Payer: Self-pay | Admitting: *Deleted

## 2021-05-10 NOTE — Chronic Care Management (AMB) (Signed)
  Chronic Care Management   Note  05/10/2021 Name: Tracy Morales MRN: [id number] DOB: [birth year]  TYSHAUNA FINKBINER is a 87 y.o. year old female who is a primary care patient of Tower, Wynelle Fanny, MD. I reached out to Mateo Flow by phone today in response to a referral sent by Ms. Metcalf PCP.  Ms. Mcdevitt was given information about Chronic Care Management services today including:  CCM service includes personalized support from designated clinical staff supervised by her physician, including individualized plan of care and coordination with other care providers 24/7 contact phone numbers for assistance for urgent and routine care needs. Service will only be billed when office clinical staff spend 20 minutes or more in a month to coordinate care. Only one practitioner may furnish and bill the service in a calendar month. The patient may stop CCM services at any time (effective at the end of the month) by phone call to the office staff. The patient is responsible for co-pay (up to 20% after annual deductible is met) if co-pay is required by the individual health plan.   Patient agreed to services and verbal consent obtained.   Follow up plan: Telephone appointment with care management team member scheduled for: 05/22/2021  Julian Hy, [place] Management  Direct Dial: [phone]

## 2021-05-10 NOTE — Telephone Encounter (Signed)
They reached out to pt/family today and has scheduled an appt to discuss this for 05/22/21

## 2021-05-10 NOTE — Chronic Care Management (AMB) (Signed)
  Chronic Care Management   Outreach Note  05/10/2021 Name: [name] MRN: [id number] DOB: 06-12-1919  Tracy Morales is a 87 y.o. year old female who is a primary care patient of Tower, Audrie Gallus, MD. I reached out to [name] by phone today in response to a referral sent by Ms. Danae Orleans Mcguinn's primary care provider.  An unsuccessful telephone outreach was attempted today. The patient was referred to the case management team for assistance with care management and care coordination.   Follow Up Plan: A HIPAA compliant phone message was left for the patient providing contact information and requesting a return call.  If patient returns call to provider office, please advise to call Embedded Care Management Care Guide Alegra Rost at [phone]  Burman Nieves, CCMA Care Guide, Embedded Care Coordination [facility]  Care Management  Direct Dial: [phone]

## 2021-05-16 ENCOUNTER — Telehealth: Payer: Self-pay | Admitting: Family Medicine

## 2021-05-16 DIAGNOSIS — E7800 Pure hypercholesterolemia, unspecified: Secondary | ICD-10-CM

## 2021-05-16 DIAGNOSIS — I10 Essential (primary) hypertension: Secondary | ICD-10-CM

## 2021-05-16 DIAGNOSIS — D500 Iron deficiency anemia secondary to blood loss (chronic): Secondary | ICD-10-CM

## 2021-05-16 DIAGNOSIS — R7309 Other abnormal glucose: Secondary | ICD-10-CM

## 2021-05-16 DIAGNOSIS — R739 Hyperglycemia, unspecified: Secondary | ICD-10-CM

## 2021-05-16 NOTE — Telephone Encounter (Signed)
-----   Message from Alvina Chou sent at 05/02/2021 10:12 AM EST ----- Regarding: Lab orders for Wednesday, 12.14.22 Patient is scheduled for CPX labs, please order future labs, Thanks , Camelia Eng

## 2021-05-17 ENCOUNTER — Other Ambulatory Visit: Payer: Medicare PPO

## 2021-05-22 ENCOUNTER — Ambulatory Visit (INDEPENDENT_AMBULATORY_CARE_PROVIDER_SITE_OTHER): Payer: Medicare PPO | Admitting: *Deleted

## 2021-05-22 ENCOUNTER — Encounter: Payer: Medicare PPO | Admitting: Family Medicine

## 2021-05-22 DIAGNOSIS — S72009A Fracture of unspecified part of neck of unspecified femur, initial encounter for closed fracture: Secondary | ICD-10-CM

## 2021-05-22 DIAGNOSIS — I10 Essential (primary) hypertension: Secondary | ICD-10-CM

## 2021-05-22 NOTE — Patient Instructions (Signed)
Visit Information   Thank you for taking time to visit with me today. Please don't hesitate to contact me if I can be of assistance to you before our next scheduled telephone appointment.    Our next appointment is by telephone on 05/14/21 at 10:45  Please call the care guide team at [phone] if you need to cancel or reschedule your appointment.   If you are experiencing a Mental Health or [place] or need someone to talk to, please call the Suicide and Crisis Lifeline: 988 call 911   Following is a copy of your full care plan:  Care Plan : LCSW Plan of Care  Updates made by Deirdre Peer, LCSW since 05/22/2021 12:00 AM     Problem: Functional Decline      Long-Range Goal: Provide resources, support and guidance for caring for patient in the home   Start Date: 05/22/2021  Expected End Date: 07/03/2021  This Visit's Progress: On track  Priority: High  Note:   Current barriers:   Patient in need of assistance with connecting to community resources for Level of care concerns, ADL IADL limitations, caregiver strain, Inability to perform ADL's independently, and Lacks knowledge of community resource:  Acknowledges deficits with meeting this unmet need Patient is unable to independently navigate community resource options without care coordination support Clinical Goals:  explore community resource options for unmet needs related to:  Stress, Physical Activity, and None Clinical Interventions:  Spoke with pt's daughter, Enis Slipper, who lives with pt along with her husband and son. They are providing in -home care to pt post hip fx along with Home Health (RN, PT, bath aide).  Pt is in a hospital bed and family gets her up some to wheelchair and/or chair- due to a bedsore they try to change her position often. Pt also needs incontinent supplies- will link them to a possible provider for this and suggested the daughter has the [place] [facility]) to assess and request  the specific wheelchair they recommend- pt using someone else's old one and it's "getting wobbly".  Daughter's caregiving stress acknowledged and she will look into counseling support through her insurance. Will also ask Care guide to send resources.   Collaboration with Tower, Wynelle Fanny, MD regarding development and update of comprehensive plan of care as evidenced by provider attestation and co-signature Inter-disciplinary care team collaboration (see longitudinal plan of care) Assessment of needs, barriers , agencies contacted, as well as how impacting  Review various resources, discussed options and provided patient information about Level of care concerns, strain and stress to caregiver, Inability to perform ADL's independently, and Lacks knowledge of community resource:   Collaborated with appropriate clinical care team members regarding patient needs Level of care concerns, ADL IADL limitations, and Lacks knowledge of community resource:   , CAD and 87yo with hip fx and immobility Patient interviewed and appropriate assessments performed Referred patient to community resources care guide team for assistance with senior resource support (caregiver support services), private duty care agency lists Provided patient with information about in-home care needs/support and coverage Discussed plans with patient for ongoing care management follow up and provided patient with direct contact information for care management team Assisted patient/caregiver with obtaining information about health plan benefits Provided education and assistance to client regarding Advanced Directives. Provided education to patient/caregiver regarding level of care options. Other interventions provided: Solution-Focused Strategies employed:   Active listening / Reflection utilized  Problem Solving [place] strategies reviewed Caregiver stress acknowledged  Participation in counseling encouraged  Participation in support  group encouraged  Consideration of in-home help encouraged  Increase in actives / exercise encouraged  Patient Goals:    -ask HHPT about wheelchair recommendations and to request the specific type through PCP -consider in-home caregiver support through private duty care agency - begin a notebook of services in my neighborhood or community - call 211 when I need some help - follow-up on any referrals for help I am given - think ahead to make sure my need does not become an emergency - make a note about what I need to have by the phone or take with me, like an identification card or social security number have a back-up plan - have a back-up plan - make a list of family or friends that I can call  -  Follow Up Plan: Appointment scheduled for SW follow up with client by phone on:  05/14/21     Consent to CCM Services: Ms. Sol was given information about Chronic Care Management services including:  CCM service includes personalized support from designated clinical staff supervised by her physician, including individualized plan of care and coordination with other care providers 24/7 contact phone numbers for assistance for urgent and routine care needs. Service will only be billed when office clinical staff spend 20 minutes or more in a month to coordinate care. Only one practitioner may furnish and bill the service in a calendar month. The patient may stop CCM services at any time (effective at the end of the month) by phone call to the office staff. The patient will be responsible for cost sharing (co-pay) of up to 20% of the service fee (after annual deductible is met).  Patient agreed to services and verbal consent obtained.   The patient verbalized understanding of instructions, educational materials, and care plan provided today and declined offer to receive copy of patient instructions, educational materials, and care plan.   Telephone follow up appointment with care management  team member scheduled for:05/14/21

## 2021-05-22 NOTE — Chronic Care Management (AMB) (Addendum)
Chronic Care Management    Clinical Social Work Note  05/22/2021 Name: Tracy Morales MRN: [id number] DOB: [birth year]  Tracy Morales is a 87 y.o. year old female who is a primary care patient of Tower, Audrie Gallus, MD. The CCM team was consulted to assist the patient with chronic disease management and/or care coordination needs related to: [employer] , Level of Care Concerns, and Caregiver Stress.   Engaged with patient by telephone for initial visit in response to provider referral for social work chronic care management and care coordination services.   Consent to Services:  The patient was given information about Chronic Care Management services, agreed to services, and gave verbal consent prior to initiation of services.  Please see initial visit note for detailed documentation.   Patient agreed to services and consent obtained.   Assessment: Review of patient past medical history, allergies, medications, and health status, including review of relevant consultants reports was performed today as part of a comprehensive evaluation and provision of chronic care management and care coordination services.     SDOH (Social Determinants of Health) assessments and interventions performed:    Advanced Directives Status:  Discussed with daughter and will have packet mailed to them to review and complete if desired.  CCM Care Plan  Allergies  Allergen Reactions   Ace Inhibitors     REACTION: cough   Donepezil Diarrhea and Other (See Comments)    GI problems Other reaction(s): Other (See Comments) GI problems   Sulfa Antibiotics Nausea Only    headache    Outpatient Encounter Medications as of 05/22/2021  Medication Sig   acetaminophen (TYLENOL) 500 MG tablet Take 500 mg by mouth every 6 (six) hours as needed. OTC-UAD   amLODipine (NORVASC) 5 MG tablet TAKE 1 TABLET BY MOUTH EVERY DAY   aspirin 325 MG EC tablet Take 325 mg by mouth daily.   bisacodyl (DULCOLAX) 10 MG  suppository Place 1 suppository (10 mg total) rectally daily as needed for moderate constipation.   Cholecalciferol (VITAMIN D3) 2000 units capsule Take 2,000 Units by mouth daily.   ciclopirox (LOPROX) 0.77 % SUSP APPLY TO SCALP 3 TIMES WEEKLY AND LEAVE IN AS DIRECTED. NOT COVERED BY PLAN (Patient taking differently: Apply 1 application topically once a week. Usually on Saturdays)   clobetasol (TEMOVATE) 0.05 % external solution APPLY TO SCALP ONCE DAILY AS NEEDED FOR ITCH. USE ON SCALP. AVOID FACE / GROIN / UNDERARM   CRANBERRY PO Take 1 tablet by mouth daily.   docusate sodium (COLACE) 100 MG capsule Take 1 capsule (100 mg total) by mouth 2 (two) times daily.   feeding supplement (ENSURE ENLIVE / ENSURE PLUS) LIQD Take 237 mLs by mouth 2 (two) times daily between meals.   ferrous sulfate 325 (65 FE) MG tablet Take 1 tablet (325 mg total) by mouth 2 (two) times daily.   Fluocinolone Acetonide Body 0.01 % OIL Apply 1 application topically daily.   HYDROcodone-acetaminophen (NORCO/VICODIN) 5-325 MG tablet Take 0.5-1 tablets by mouth every 6 (six) hours as needed for moderate pain.   hydrocortisone 2.5 % cream apply to affected area on Eyebrows twice daily for up to 2 week. Use on FACE   ketoconazole (NIZORAL) 2 % shampoo MESSAGE INTO SCALP 3 TIMES A WEEK, LEAVE ON FOR 10 MINUTES (Patient taking differently: Apply 1 application topically once a week. Usually on Wednesdays)   Meclizine HCl (BONINE PO) Take 50 mg by mouth as needed.   mirtazapine (REMERON) 30 MG tablet  TAKE 1 TABLET BY MOUTH EVERYDAY AT BEDTIME (Patient taking differently: Take 30 mg by mouth at bedtime.)   polyethylene glycol (MIRALAX / GLYCOLAX) 17 g packet Take 17 g by mouth 2 (two) times daily.   psyllium (METAMUCIL) 58.6 % packet Take 1 packet by mouth daily as needed.   simvastatin (ZOCOR) 20 MG tablet TAKE 1 TABLET BY MOUTH EVERY DAY   No facility-administered encounter medications on file as of 05/22/2021.    Patient Active  Problem List   Diagnosis Date Noted   Hematoma 04/19/2021   Urinary retention 04/11/2021   Bed sore on buttock 04/11/2021   Iron deficiency anemia 04/11/2021   Malnutrition of moderate degree 03/20/2021   ABLA 03/19/2021   Hip fracture ([place]) 03/18/2021   DNR (do not resuscitate) 03/18/2021   Closed displaced transverse fracture of shaft of right femur ([place])    Fall    COVID-19 01/19/2021   Pincer nail deformity 07/04/2020   Phlebitis 05/20/2020   Left leg pain 02/19/2020   Ankle sprain 01/15/2020   Seborrheic dermatitis of scalp 08/27/2019   Elevated random blood glucose level 05/11/2019   Pain due to onychomycosis of toenails of both feet 12/04/2018   Recurrent UTI 09/22/2018   Low back pain 09/15/2018   MCI (mild cognitive impairment) 05/11/2018   Frequent urination 02/28/2018   Mobility impaired 05/06/2017   Osteoarthritis of both hips 04/15/2015   Hip pain 04/14/2015   Encounter for general adult medical examination with abnormal findings 04/12/2015   Hair loss 11/19/2014   Loss of weight 06/09/2014   Arthritis of right shoulder region 06/09/2014   Nodule of left lung 06/09/2014   Encounter for Medicare annual wellness exam 10/30/2013   H/O fall 04/27/2013   Risk for falls 04/11/2012   Leg pain, bilateral 01/29/2012   MICROSCOPIC HEMATURIA 08/29/2009   CONDUCTIVE HEARING LOSS BILATERAL 08/08/2009   EDEMA 04/07/2009   BRADYCARDIA 10/26/2008   DYSPNEA 10/26/2008   INCONTINENCE, URGE 02/27/2008   Hyperlipidemia 10/11/2006   PERIPHERAL NEUROPATHY, LOWER EXTREMITIES, BILATERAL 10/11/2006   Essential hypertension 123XX123   DIASTOLIC DYSFUNCTION 123XX123   CAROTID STENOSIS 10/11/2006   CEREBROVASCULAR DISEASE 10/11/2006   PERIPHERAL VASCULAR DISEASE 10/11/2006   ALLERGIC RHINITIS 10/11/2006   CKD stage IIIb ([place]) 10/11/2006   OVERACTIVE BLADDER 10/11/2006   OSTEOARTHRITIS 10/11/2006   CARDIAC MURMUR, HX OF 10/11/2006    Conditions to be addressed/monitored:  CAD and 87yo with hip fx ; Level of care concerns, Limited access to caregiver, Inability to perform ADL's independently, and Lacks knowledge of community resource:    Care Plan : LCSW Plan of Care  Updates made by Deirdre Peer, LCSW since 05/22/2021 12:00 AM     Problem: Functional Decline      Long-Range Goal: Provide resources, support and guidance for caring for patient in the home   Start Date: 05/22/2021  Expected End Date: 07/03/2021  This Visit's Progress: On track  Priority: High  Note:   Current barriers:   Patient in need of assistance with connecting to community resources for Level of care concerns, ADL IADL limitations, caregiver strain, Inability to perform ADL's independently, and Lacks knowledge of community resource: Acknowledges deficits with meeting this unmet need Patient is unable to independently navigate community resource options without care coordination support Clinical Goals:  explore community resource options for unmet needs related to:  Stress, Physical Activity, and None Clinical Interventions:  Spoke with pt's daughter, Enis Slipper, who lives with pt along with her husband and son.  They are providing in -home care to pt post hip fx along with Home Health (RN, PT, bath aide).  Pt is in a hospital bed and family gets her up some to wheelchair and/or chair- due to a bedsore they try to change her position often. Pt also needs incontinent supplies- will link them to a possible provider for this and suggested the daughter has the [facility]) to assess and request the specific wheelchair they recommend- pt using someone else's old one and it's "getting wobbly".  Daughter's caregiving stress acknowledged and she will look into counseling support through her insurance. Will also ask Care guide to send resources.   Collaboration with Tower, Wynelle Fanny, MD regarding development and update of comprehensive plan of care as evidenced by provider attestation and  co-signature Inter-disciplinary care team collaboration (see longitudinal plan of care) Assessment of needs, barriers , agencies contacted, as well as how impacting  Review various resources, discussed options and provided patient information about Level of care concerns, strain and stress to caregiver, Inability to perform ADL's independently, and Lacks knowledge of community resource:   Collaborated with appropriate clinical care team members regarding patient needs Level of care concerns, ADL IADL limitations, and Lacks knowledge of community resource:   , CAD and 87yo with hip fx and immobility Patient interviewed and appropriate assessments performed Referred patient to community resources care guide team for assistance with senior resource support (caregiver support services), private duty care agency lists Provided patient with information about in-home care needs/support and coverage Discussed plans with patient for ongoing care management follow up and provided patient with direct contact information for care management team Assisted patient/caregiver with obtaining information about health plan benefits Provided education and assistance to client regarding Advanced Directives. Provided education to patient/caregiver regarding level of care options. Other interventions provided: Solution-Focused Strategies employed:   Active listening / Reflection utilized  Problem Solving [place] strategies reviewed Caregiver stress acknowledged  Participation in counseling encouraged  Participation in support group encouraged  Consideration of in-home help encouraged  Increase in actives / exercise encouraged  Patient Goals:    -ask HHPT about wheelchair recommendations and to request the specific type through PCP -consider in-home caregiver support through private duty care agency - begin a notebook of services in my neighborhood or community - call 211 when I need some help - follow-up on  any referrals for help I am given - think ahead to make sure my need does not become an emergency - make a note about what I need to have by the phone or take with me, like an identification card or social security number have a back-up plan - have a back-up plan - make a list of family or friends that I can call  -  Follow Up Plan: Appointment scheduled for SW follow up with client by phone on:  06/14/21      Follow Up Plan: Appointment scheduled for SW follow up with client by phone on: 06/14/21      Eduard Clos MSW, [place] Licensed Clinical Social Worker [place] [phone]

## 2021-05-23 ENCOUNTER — Telehealth: Payer: Self-pay

## 2021-05-23 NOTE — Telephone Encounter (Signed)
° °  Telephone encounter was:  Successful.  05/23/2021 Name: Tracy Morales MRN: [id number] DOB: Nov 07, 1919  Tracy Morales is a 87 y.o. year old female who is a primary care patient of Tower, Audrie Gallus, MD . The community resource team was consulted for assistance with  in home care, senior resources and advanced directive packet  Care guide performed the following interventions: Spoke with patient's daughter Tracy Morales confirmed her mailing address and sent resource letter and advanced directive packet to [employer] printer to be mailed.  Follow Up Plan:  Care guide will follow up with patient by phone over the next 7-10days  Tracy Morales, AAS Paralegal, [facility] Care Guide  Embedded Care Coordination [facility]   Care Management  [address] [place], [place] [address] ??[email or web address]   ?? [phone]   [email or web address]

## 2021-06-01 ENCOUNTER — Other Ambulatory Visit: Payer: Self-pay

## 2021-06-01 ENCOUNTER — Ambulatory Visit (INDEPENDENT_AMBULATORY_CARE_PROVIDER_SITE_OTHER): Payer: Medicare PPO

## 2021-06-01 ENCOUNTER — Ambulatory Visit: Payer: Medicare PPO | Admitting: Podiatry

## 2021-06-01 DIAGNOSIS — L97512 Non-pressure chronic ulcer of other part of right foot with fat layer exposed: Secondary | ICD-10-CM

## 2021-06-01 DIAGNOSIS — M79674 Pain in right toe(s): Secondary | ICD-10-CM | POA: Diagnosis not present

## 2021-06-02 ENCOUNTER — Telehealth: Payer: Self-pay | Admitting: Family Medicine

## 2021-06-02 DIAGNOSIS — Z7409 Other reduced mobility: Secondary | ICD-10-CM

## 2021-06-02 DIAGNOSIS — Z9181 History of falling: Secondary | ICD-10-CM

## 2021-06-02 DIAGNOSIS — S72009A Fracture of unspecified part of neck of unspecified femur, initial encounter for closed fracture: Secondary | ICD-10-CM

## 2021-06-02 NOTE — Telephone Encounter (Signed)
Roger Shelter with [facility] called asking for verbal orders for a wheel chair for pt. Roger Shelter states that you can fax the orders to [phone]. Please advise.

## 2021-06-03 DIAGNOSIS — I10 Essential (primary) hypertension: Secondary | ICD-10-CM

## 2021-06-03 DIAGNOSIS — S72009A Fracture of unspecified part of neck of unspecified femur, initial encounter for closed fracture: Secondary | ICD-10-CM

## 2021-06-06 ENCOUNTER — Encounter: Payer: Self-pay | Admitting: Podiatry

## 2021-06-06 ENCOUNTER — Telehealth: Payer: Self-pay | Admitting: Family Medicine

## 2021-06-06 NOTE — Progress Notes (Signed)
Subjective:  Patient ID: Tracy Morales, female    DOB: February 22, 1920,  MRN: [id number]  Chief Complaint  Patient presents with   Toe Pain    Right hallux toe red and swollen warm to the touch  Blood under the nail     88 y.o. female presents for wound care.  Patient presents with concern for right first interspace wound probing down to deep tissue.  She states is getting swollen and warm to touch there are some redness.  They wanted get evaluated.  She states that there is some pain present.  She has not seen anyone else prior to seeing me.  She denies any other acute complaints.  They have not been putting anything for the wound.  She does have a history of chronic kidney disease   Review of Systems: Negative except as noted in the HPI. Denies N/V/F/Ch.  Past Medical History:  Diagnosis Date   Allergic rhinitis, cause unspecified    Bradycardia    Cerebrovascular disease, unspecified    Chronic kidney disease, unspecified    chronic renal insufficiency   Dizziness and giddiness    DVT (deep venous thrombosis) (HCC)    R   Dyspnea    echo with normal EF. 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   HTN (hypertension)    Hyperlipidemia    Hypertonicity of bladder    hx   Mild depression    Mononeuritis of lower limb, unspecified    peripheral neuropathy, bilateral   Occlusion and stenosis of carotid artery without mention of cerebral infarction 09/03/2007   u/s L 40-59%, R 0-39%; stable for many years   Osteoarthrosis, unspecified whether generalized or localized, unspecified site    Peripheral vascular disease, unspecified (HCC)    Polymyalgia rheumatica (HCC)    hx    Current Outpatient Medications:    acetaminophen (TYLENOL) 500 MG tablet, Take 500 mg by mouth every 6 (six) hours as needed. OTC-UAD, Disp: , Rfl:    amLODipine (NORVASC) 5 MG tablet, TAKE 1 TABLET BY MOUTH EVERY DAY, Disp: 90 tablet, Rfl: 0   aspirin 325 MG EC tablet, Take 325  mg by mouth daily., Disp: , Rfl:    bisacodyl (DULCOLAX) 10 MG suppository, Place 1 suppository (10 mg total) rectally daily as needed for moderate constipation., Disp: 12 suppository, Rfl: 0   Cholecalciferol (VITAMIN D3) 2000 units capsule, Take 2,000 Units by mouth daily., Disp: , Rfl:    ciclopirox (LOPROX) 0.77 % SUSP, APPLY TO SCALP 3 TIMES WEEKLY AND LEAVE IN AS DIRECTED. NOT COVERED BY PLAN (Patient taking differently: Apply 1 application topically once a week. Usually on Saturdays), Disp: 60 mL, Rfl: 2   clobetasol (TEMOVATE) 0.05 % external solution, APPLY TO SCALP ONCE DAILY AS NEEDED FOR ITCH. USE ON SCALP. AVOID FACE / GROIN / UNDERARM, Disp: 50 mL, Rfl: 1   CRANBERRY PO, Take 1 tablet by mouth daily., Disp: , Rfl:    docusate sodium (COLACE) 100 MG capsule, Take 1 capsule (100 mg total) by mouth 2 (two) times daily., Disp: 10 capsule, Rfl: 0   feeding supplement (ENSURE ENLIVE / ENSURE PLUS) LIQD, Take 237 mLs by mouth 2 (two) times daily between meals., Disp: 237 mL, Rfl: 12   ferrous sulfate 325 (65 FE) MG tablet, Take 1 tablet (325 mg total) by mouth 2 (two) times daily., Disp: 60 tablet, Rfl: 11   Fluocinolone Acetonide Body 0.01 % OIL, Apply 1 application topically  daily., Disp: 120 mL, Rfl: 5   HYDROcodone-acetaminophen (NORCO/VICODIN) 5-325 MG tablet, Take 0.5-1 tablets by mouth every 6 (six) hours as needed for moderate pain., Disp: 30 tablet, Rfl: 0   hydrocortisone 2.5 % cream, apply to affected area on Eyebrows twice daily for up to 2 week. Use on FACE, Disp: 28 g, Rfl: 3   ketoconazole (NIZORAL) 2 % shampoo, MESSAGE INTO SCALP 3 TIMES A WEEK, LEAVE ON FOR 10 MINUTES (Patient taking differently: Apply 1 application topically once a week. Usually on Wednesdays), Disp: 120 mL, Rfl: 2   Meclizine HCl (BONINE PO), Take 50 mg by mouth as needed., Disp: , Rfl:    mirtazapine (REMERON) 30 MG tablet, TAKE 1 TABLET BY MOUTH EVERYDAY AT BEDTIME (Patient taking differently: Take 30 mg by  mouth at bedtime.), Disp: 90 tablet, Rfl: 1   polyethylene glycol (MIRALAX / GLYCOLAX) 17 g packet, Take 17 g by mouth 2 (two) times daily., Disp: 14 each, Rfl: 0   psyllium (METAMUCIL) 58.6 % packet, Take 1 packet by mouth daily as needed., Disp: , Rfl:    simvastatin (ZOCOR) 20 MG tablet, TAKE 1 TABLET BY MOUTH EVERY DAY, Disp: 90 tablet, Rfl: 0  Social History   Tobacco Use  Smoking Status Never  Smokeless Tobacco Never  Tobacco Comments   non smoker     Allergies  Allergen Reactions   Ace Inhibitors     REACTION: cough   Donepezil Diarrhea and Other (See Comments)    GI problems Other reaction(s): Other (See Comments) GI problems   Sulfa Antibiotics Nausea Only    headache   Objective:  There were no vitals filed for this visit. There is no height or weight on file to calculate BMI. Constitutional Well developed. Well nourished.  Vascular Dorsalis pedis pulses palpable bilaterally. Posterior tibial pulses palpable bilaterally. Capillary refill normal to all digits.  No cyanosis or clubbing noted. Pedal hair growth normal.  Neurologic Normal speech. Oriented to person, place, and time. Protective sensation absent  Dermatologic Wound Location: Right first interspace wound probing down to deep tissue but not bone.  No purulent drainage noted.  No redness noted.  No malodor present. Wound Base: Mixed Granular/Fibrotic Peri-wound: Macerated Exudate: Scant/small amount Serosanguinous exudate Wound Measurements: -See below  Orthopedic: No pain to palpation either foot.   Radiographs: 3 views of skeletally mature the right foot:No soft tissue emphysema noted.  No osteoarthritis noted.  General lysed osteopenia noted to the whole foot. Assessment:   1. Right foot ulcer, with fat layer exposed (HCC)    Plan:  Patient was evaluated and treated and all questions answered.  Ulcer right first interspace wound probing noted deep tissue -Debridement as below. -Dressed with  Betadine wet-to-dry, DSD. -Continue off-loading with surgical shoe.  Procedure: Excisional Debridement of Wound Tool: Sharp chisel blade/tissue nipper Rationale: Removal of non-viable soft tissue from the wound to promote healing.  Anesthesia: none Pre-Debridement Wound Measurements: 0.5 cm x 0.5 cm x 0.3 cm  Post-Debridement Wound Measurements: 0.6 cm x 0.3 cm x 0.3 cm  Type of Debridement: Sharp Excisional Tissue Removed: Non-viable soft tissue Blood loss: Minimal (<50cc) Depth of Debridement: subcutaneous tissue. Technique: Sharp excisional debridement to bleeding, viable wound base.  Wound Progress: This is our initial evaluation we will continue to monitor the progress of the wound Site healing conversation 7 Dressing: Dry, sterile, compression dressing. Disposition: Patient tolerated procedure well. Patient to return in 1 week for follow-up.  No follow-ups on file.

## 2021-06-06 NOTE — Telephone Encounter (Signed)
Please ok those verbal orders  

## 2021-06-06 NOTE — Telephone Encounter (Signed)
Order refaxed and went through this time.

## 2021-06-06 NOTE — Telephone Encounter (Signed)
VO given to Roger Shelter, tried to fax paper order but fax # line was busy, will try to call back tomorrow, if no answer from fax # today

## 2021-06-06 NOTE — Telephone Encounter (Signed)
Order done and in IN box  

## 2021-06-06 NOTE — Addendum Note (Signed)
Addended by: Roxy Manns A on: 06/06/2021 04:37 PM   Modules accepted: Orders

## 2021-06-06 NOTE — Telephone Encounter (Signed)
Verbal order given  

## 2021-06-06 NOTE — Telephone Encounter (Signed)
Home Health verbal orders Caller Name: Rachael Fee Agency Name: [facility]  Callback number: [phone]  Requesting OT/PT/Skilled nursing/Social Work/Speech: continuation on PT  Reason: GAIT TRAINING, REDUCE FALL RISK  Frequency: 1W9   Please forward to [facility] pool or providers CMA

## 2021-06-07 ENCOUNTER — Ambulatory Visit: Payer: Medicare PPO | Admitting: Podiatry

## 2021-06-10 DIAGNOSIS — D631 Anemia in chronic kidney disease: Secondary | ICD-10-CM | POA: Diagnosis not present

## 2021-06-10 DIAGNOSIS — S72001D Fracture of unspecified part of neck of right femur, subsequent encounter for closed fracture with routine healing: Secondary | ICD-10-CM | POA: Diagnosis not present

## 2021-06-10 DIAGNOSIS — N1832 Chronic kidney disease, stage 3b: Secondary | ICD-10-CM | POA: Diagnosis not present

## 2021-06-10 DIAGNOSIS — Z9181 History of falling: Secondary | ICD-10-CM

## 2021-06-10 DIAGNOSIS — M353 Polymyalgia rheumatica: Secondary | ICD-10-CM | POA: Diagnosis not present

## 2021-06-10 DIAGNOSIS — Z7982 Long term (current) use of aspirin: Secondary | ICD-10-CM

## 2021-06-10 DIAGNOSIS — Z8673 Personal history of transient ischemic attack (TIA), and cerebral infarction without residual deficits: Secondary | ICD-10-CM

## 2021-06-10 DIAGNOSIS — I129 Hypertensive chronic kidney disease with stage 1 through stage 4 chronic kidney disease, or unspecified chronic kidney disease: Secondary | ICD-10-CM | POA: Diagnosis not present

## 2021-06-10 DIAGNOSIS — L89312 Pressure ulcer of right buttock, stage 2: Secondary | ICD-10-CM | POA: Diagnosis not present

## 2021-06-10 DIAGNOSIS — R1310 Dysphagia, unspecified: Secondary | ICD-10-CM

## 2021-06-10 DIAGNOSIS — R339 Retention of urine, unspecified: Secondary | ICD-10-CM | POA: Diagnosis not present

## 2021-06-10 DIAGNOSIS — Z466 Encounter for fitting and adjustment of urinary device: Secondary | ICD-10-CM | POA: Diagnosis not present

## 2021-06-10 DIAGNOSIS — L89322 Pressure ulcer of left buttock, stage 2: Secondary | ICD-10-CM | POA: Diagnosis not present

## 2021-06-12 ENCOUNTER — Telehealth: Payer: Self-pay

## 2021-06-12 NOTE — Telephone Encounter (Signed)
° °  Telephone encounter was:  Successful.  06/12/2021 Name: BEVERLYN MCGINNESS MRN: [id number] DOB: 28-Mar-1920  MADLYN CROSBY is a 88 y.o. year old female who is a primary care patient of Tower, Audrie Gallus, MD . The community resource team was consulted for assistance with in-home care, senior resouces, caregiver support and advance directive.    Care guide performed the following interventions: Spoke with patient's daughter Maren Reamer, she has received mailed information for in-home care, senior resouces, caregiver support and advance directive.   Follow Up Plan:  No further follow up planned at this time. The patient has been provided with needed resources.  Shannon Kirkendall, AAS Paralegal, [facility] Care Guide  Embedded Care Coordination [place]   Care Management  [address] [place], [place] [address] ??[email or web address]   ?? [phone]   [email or web address]

## 2021-06-14 ENCOUNTER — Telehealth: Payer: Medicare PPO

## 2021-06-15 ENCOUNTER — Telehealth: Payer: Self-pay | Admitting: *Deleted

## 2021-06-15 NOTE — Telephone Encounter (Signed)
°  Care Management   Follow Up Note   06/15/2021 Name: Tracy Morales MRN: [id number] DOB: 1920/04/05   Referred by: Tower, Wynelle Fanny, MD Reason for referral : No chief complaint on file.   An unsuccessful telephone outreach was attempted today. The patient was referred to the case management team for assistance with care management and care coordination.  A HIPPA compliant voicemail was left for daughter, Enis Slipper.  Follow Up Plan: The care management team will reach out to the patient again over the next 7-10  days.   Eduard Clos MSW, LCSW Licensed Clinical Social Worker [place] [phone]

## 2021-06-19 NOTE — Chronic Care Management (AMB) (Signed)
°  Care Management   Note  06/19/2021 Name: Tracy Morales MRN: [id number] DOB: 1919/12/19  Tracy Morales is a 88 y.o. year old female who is a primary care patient of Tower, Audrie Gallus, MD and is actively engaged with the care management team. I reached out to [name] by phone today to assist with re-scheduling a follow up visit with the Licensed Clinical Social Worker  Follow up plan: Telephone appointment with care management team member scheduled for: 06/27/2021  Burman Nieves, CCMA Care Guide, Embedded Care Coordination [facility]   Care Management  Direct Dial: [phone]

## 2021-06-21 ENCOUNTER — Ambulatory Visit: Payer: Medicare PPO | Admitting: Dermatology

## 2021-06-22 ENCOUNTER — Other Ambulatory Visit: Payer: Self-pay

## 2021-06-22 ENCOUNTER — Ambulatory Visit: Payer: Medicare PPO | Admitting: Podiatry

## 2021-06-22 ENCOUNTER — Encounter: Payer: Self-pay | Admitting: Podiatry

## 2021-06-22 DIAGNOSIS — L97512 Non-pressure chronic ulcer of other part of right foot with fat layer exposed: Secondary | ICD-10-CM

## 2021-06-22 DIAGNOSIS — E1149 Type 2 diabetes mellitus with other diabetic neurological complication: Secondary | ICD-10-CM

## 2021-06-22 MED ORDER — DOXYCYCLINE HYCLATE 100 MG PO TABS: 100.0000 mg | ORAL_TABLET | Freq: Two times a day (BID) | ORAL | 0 refills | Status: AC

## 2021-06-22 NOTE — Progress Notes (Signed)
Subjective:  Patient ID: Tracy Morales, female    DOB: September 14, 1919,  MRN: [id number]  Chief Complaint  Patient presents with   Foot Ulcer    Right foot ulcer     88 y.o. female presents for wound care.  Patient presents with concern for right first interspace wound probing down to deep tissue.  She states is painful.  They have been getting the foot wet.  I discussed with them did not stop doing that immediately.  It should be dried.  They have been putting the Betadine on there.   Review of Systems: Negative except as noted in the HPI. Denies N/V/F/Ch.  Past Medical History:  Diagnosis Date   Allergic rhinitis, cause unspecified    Bradycardia    Cerebrovascular disease, unspecified    Chronic kidney disease, unspecified    chronic renal insufficiency   Dizziness and giddiness    DVT (deep venous thrombosis) (HCC)    R   Dyspnea    echo with normal EF. 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   HTN (hypertension)    Hyperlipidemia    Hypertonicity of bladder    hx   Mild depression    Mononeuritis of lower limb, unspecified    peripheral neuropathy, bilateral   Occlusion and stenosis of carotid artery without mention of cerebral infarction 09/03/2007   u/s L 40-59%, R 0-39%; stable for many years   Osteoarthrosis, unspecified whether generalized or localized, unspecified site    Peripheral vascular disease, unspecified (HCC)    Polymyalgia rheumatica (HCC)    hx    Current Outpatient Medications:    doxycycline (VIBRA-TABS) 100 MG tablet, Take 1 tablet (100 mg total) by mouth 2 (two) times daily for 14 days., Disp: 28 tablet, Rfl: 0   acetaminophen (TYLENOL) 500 MG tablet, Take 500 mg by mouth every 6 (six) hours as needed. OTC-UAD, Disp: , Rfl:    amLODipine (NORVASC) 5 MG tablet, TAKE 1 TABLET BY MOUTH EVERY DAY, Disp: 90 tablet, Rfl: 0   aspirin 325 MG EC tablet, Take 325 mg by mouth daily., Disp: , Rfl:    bisacodyl (DULCOLAX) 10 MG  suppository, Place 1 suppository (10 mg total) rectally daily as needed for moderate constipation., Disp: 12 suppository, Rfl: 0   Cholecalciferol (VITAMIN D3) 2000 units capsule, Take 2,000 Units by mouth daily., Disp: , Rfl:    ciclopirox (LOPROX) 0.77 % SUSP, APPLY TO SCALP 3 TIMES WEEKLY AND LEAVE IN AS DIRECTED. NOT COVERED BY PLAN (Patient taking differently: Apply 1 application topically once a week. Usually on Saturdays), Disp: 60 mL, Rfl: 2   clobetasol (TEMOVATE) 0.05 % external solution, APPLY TO SCALP ONCE DAILY AS NEEDED FOR ITCH. USE ON SCALP. AVOID FACE / GROIN / UNDERARM, Disp: 50 mL, Rfl: 1   CRANBERRY PO, Take 1 tablet by mouth daily., Disp: , Rfl:    docusate sodium (COLACE) 100 MG capsule, Take 1 capsule (100 mg total) by mouth 2 (two) times daily., Disp: 10 capsule, Rfl: 0   feeding supplement (ENSURE ENLIVE / ENSURE PLUS) LIQD, Take 237 mLs by mouth 2 (two) times daily between meals., Disp: 237 mL, Rfl: 12   ferrous sulfate 325 (65 FE) MG tablet, Take 1 tablet (325 mg total) by mouth 2 (two) times daily., Disp: 60 tablet, Rfl: 11   Fluocinolone Acetonide Body 0.01 % OIL, Apply 1 application topically daily., Disp: 120 mL, Rfl: 5   HYDROcodone-acetaminophen (NORCO/VICODIN) 5-325 MG tablet,  Take 0.5-1 tablets by mouth every 6 (six) hours as needed for moderate pain., Disp: 30 tablet, Rfl: 0   hydrocortisone 2.5 % cream, apply to affected area on Eyebrows twice daily for up to 2 week. Use on FACE, Disp: 28 g, Rfl: 3   ketoconazole (NIZORAL) 2 % shampoo, MESSAGE INTO SCALP 3 TIMES A WEEK, LEAVE ON FOR 10 MINUTES (Patient taking differently: Apply 1 application topically once a week. Usually on Wednesdays), Disp: 120 mL, Rfl: 2   Meclizine HCl (BONINE PO), Take 50 mg by mouth as needed., Disp: , Rfl:    mirtazapine (REMERON) 30 MG tablet, TAKE 1 TABLET BY MOUTH EVERYDAY AT BEDTIME (Patient taking differently: Take 30 mg by mouth at bedtime.), Disp: 90 tablet, Rfl: 1   polyethylene  glycol (MIRALAX / GLYCOLAX) 17 g packet, Take 17 g by mouth 2 (two) times daily., Disp: 14 each, Rfl: 0   psyllium (METAMUCIL) 58.6 % packet, Take 1 packet by mouth daily as needed., Disp: , Rfl:    simvastatin (ZOCOR) 20 MG tablet, TAKE 1 TABLET BY MOUTH EVERY DAY, Disp: 90 tablet, Rfl: 0  Social History   Tobacco Use  Smoking Status Never  Smokeless Tobacco Never  Tobacco Comments   non smoker     Allergies  Allergen Reactions   Ace Inhibitors     REACTION: cough   Donepezil Diarrhea and Other (See Comments)    GI problems Other reaction(s): Other (See Comments) GI problems   Sulfa Antibiotics Nausea Only    headache   Objective:  There were no vitals filed for this visit. There is no height or weight on file to calculate BMI. Constitutional Well developed. Well nourished.  Vascular Dorsalis pedis pulses palpable bilaterally. Posterior tibial pulses palpable bilaterally. Capillary refill normal to all digits.  No cyanosis or clubbing noted. Pedal hair growth normal.  Neurologic Normal speech. Oriented to person, place, and time. Protective sensation absent  Dermatologic Wound Location: Right first interspace wound probing down to deep tissue but not bone.  No purulent drainage noted.  No redness noted.  No malodor present. Wound Base: Mixed Granular/Fibrotic Peri-wound: Macerated Exudate: Scant/small amount Serosanguinous exudate Wound Measurements: -See below  Orthopedic: No pain to palpation either foot.   Radiographs: 3 views of skeletally mature the right foot:No soft tissue emphysema noted.  No osteoarthritis noted.  General lysed osteopenia noted to the whole foot. Assessment:   1. Right foot ulcer, with fat layer exposed (HCC)   2. Type II diabetes mellitus with neurological manifestations [facility])     Plan:  Patient was evaluated and treated and all questions answered.  Ulcer right first interspace wound probing noted deep tissue -Debridement as  below. -Dressed with Betadine wet-to-dry, DSD. -Continue off-loading with surgical shoe.  Procedure: Excisional Debridement of Wound Tool: Sharp chisel blade/tissue nipper Rationale: Removal of non-viable soft tissue from the wound to promote healing.  Anesthesia: none Pre-Debridement Wound Measurements: 0.5 cm x 0.5 cm x 0.3 cm  Post-Debridement Wound Measurements: 0.6 cm x 0.3 cm x 0.3 cm  Type of Debridement: Sharp Excisional Tissue Removed: Non-viable soft tissue Blood loss: Minimal (<50cc) Depth of Debridement: subcutaneous tissue. Technique: Sharp excisional debridement to bleeding, viable wound base.  Wound Progress: T the wound appears to be stagnant but likely due to the amount keeping it wet. Dressing: Dry, sterile, compression dressing. Disposition: Patient tolerated procedure well. Patient to return in 1 week for follow-up.  No follow-ups on file.

## 2021-06-23 DIAGNOSIS — E1149 Type 2 diabetes mellitus with other diabetic neurological complication: Secondary | ICD-10-CM | POA: Diagnosis not present

## 2021-06-23 DIAGNOSIS — S7221XD Displaced subtrochanteric fracture of right femur, subsequent encounter for closed fracture with routine healing: Secondary | ICD-10-CM | POA: Diagnosis not present

## 2021-06-27 ENCOUNTER — Telehealth: Payer: Medicare PPO

## 2021-06-30 ENCOUNTER — Ambulatory Visit (INDEPENDENT_AMBULATORY_CARE_PROVIDER_SITE_OTHER): Payer: Medicare PPO | Admitting: *Deleted

## 2021-06-30 DIAGNOSIS — Z7409 Other reduced mobility: Secondary | ICD-10-CM

## 2021-06-30 DIAGNOSIS — I10 Essential (primary) hypertension: Secondary | ICD-10-CM

## 2021-06-30 DIAGNOSIS — G3184 Mild cognitive impairment, so stated: Secondary | ICD-10-CM

## 2021-06-30 DIAGNOSIS — Z9181 History of falling: Secondary | ICD-10-CM

## 2021-06-30 NOTE — Chronic Care Management (AMB) (Signed)
Chronic Care Management    Clinical Social Work Note  06/30/2021 Name: Tracy Morales MRN: [id number] DOB: Feb 09, 1920  Tracy Morales is a 88 y.o. year old female who is a primary care patient of Tower, Wynelle Fanny, MD. The CCM team was consulted to assist the patient with chronic disease management and/or care coordination needs related to: [employer] , Level of Care Concerns, and Caregiver Stress.   Engaged with patient by telephone for follow up visit in response to provider referral for social work chronic care management and care coordination services.   Consent to Services:  The patient was given information about Chronic Care Management services, agreed to services, and gave verbal consent prior to initiation of services.  Please see initial visit note for detailed documentation.   Patient agreed to services and consent obtained.   Assessment: Review of patient past medical history, allergies, medications, and health status, including review of relevant consultants reports was performed today as part of a comprehensive evaluation and provision of chronic care management and care coordination services.     SDOH (Social Determinants of Health) assessments and interventions performed:    Advanced Directives Status: Not addressed in this encounter.  CCM Care Plan  Allergies  Allergen Reactions   Ace Inhibitors     REACTION: cough   Donepezil Diarrhea and Other (See Comments)    GI problems Other reaction(s): Other (See Comments) GI problems   Sulfa Antibiotics Nausea Only    headache    Outpatient Encounter Medications as of 06/30/2021  Medication Sig   acetaminophen (TYLENOL) 500 MG tablet Take 500 mg by mouth every 6 (six) hours as needed. OTC-UAD   amLODipine (NORVASC) 5 MG tablet TAKE 1 TABLET BY MOUTH EVERY DAY   aspirin 325 MG EC tablet Take 325 mg by mouth daily.   bisacodyl (DULCOLAX) 10 MG suppository Place 1 suppository (10 mg total) rectally daily as needed  for moderate constipation.   Cholecalciferol (VITAMIN D3) 2000 units capsule Take 2,000 Units by mouth daily.   ciclopirox (LOPROX) 0.77 % SUSP APPLY TO SCALP 3 TIMES WEEKLY AND LEAVE IN AS DIRECTED. NOT COVERED BY PLAN (Patient taking differently: Apply 1 application topically once a week. Usually on Saturdays)   clobetasol (TEMOVATE) 0.05 % external solution APPLY TO SCALP ONCE DAILY AS NEEDED FOR ITCH. USE ON SCALP. AVOID FACE / GROIN / UNDERARM   CRANBERRY PO Take 1 tablet by mouth daily.   docusate sodium (COLACE) 100 MG capsule Take 1 capsule (100 mg total) by mouth 2 (two) times daily.   doxycycline (VIBRA-TABS) 100 MG tablet Take 1 tablet (100 mg total) by mouth 2 (two) times daily for 14 days.   feeding supplement (ENSURE ENLIVE / ENSURE PLUS) LIQD Take 237 mLs by mouth 2 (two) times daily between meals.   ferrous sulfate 325 (65 FE) MG tablet Take 1 tablet (325 mg total) by mouth 2 (two) times daily.   Fluocinolone Acetonide Body 0.01 % OIL Apply 1 application topically daily.   HYDROcodone-acetaminophen (NORCO/VICODIN) 5-325 MG tablet Take 0.5-1 tablets by mouth every 6 (six) hours as needed for moderate pain.   hydrocortisone 2.5 % cream apply to affected area on Eyebrows twice daily for up to 2 week. Use on FACE   ketoconazole (NIZORAL) 2 % shampoo MESSAGE INTO SCALP 3 TIMES A WEEK, LEAVE ON FOR 10 MINUTES (Patient taking differently: Apply 1 application topically once a week. Usually on Wednesdays)   Meclizine HCl (BONINE PO) Take 50 mg  by mouth as needed.   mirtazapine (REMERON) 30 MG tablet TAKE 1 TABLET BY MOUTH EVERYDAY AT BEDTIME (Patient taking differently: Take 30 mg by mouth at bedtime.)   polyethylene glycol (MIRALAX / GLYCOLAX) 17 g packet Take 17 g by mouth 2 (two) times daily.   psyllium (METAMUCIL) 58.6 % packet Take 1 packet by mouth daily as needed.   simvastatin (ZOCOR) 20 MG tablet TAKE 1 TABLET BY MOUTH EVERY DAY   No facility-administered encounter medications on  file as of 06/30/2021.    Patient Active Problem List   Diagnosis Date Noted   Hematoma 04/19/2021   Urinary retention 04/11/2021   Bed sore on buttock 04/11/2021   Iron deficiency anemia 04/11/2021   Malnutrition of moderate degree 03/20/2021   ABLA 03/19/2021   Hip fracture ([place]) 03/18/2021   DNR (do not resuscitate) 03/18/2021   Closed displaced transverse fracture of shaft of right femur ([place])    Fall    COVID-19 01/19/2021   Pincer nail deformity 07/04/2020   Phlebitis 05/20/2020   Left leg pain 02/19/2020   Ankle sprain 01/15/2020   Seborrheic dermatitis of scalp 08/27/2019   Elevated random blood glucose level 05/11/2019   Pain due to onychomycosis of toenails of both feet 12/04/2018   Recurrent UTI 09/22/2018   Low back pain 09/15/2018   MCI (mild cognitive impairment) 05/11/2018   Frequent urination 02/28/2018   Mobility impaired 05/06/2017   Osteoarthritis of both hips 04/15/2015   Hip pain 04/14/2015   Encounter for general adult medical examination with abnormal findings 04/12/2015   Hair loss 11/19/2014   Loss of weight 06/09/2014   Arthritis of right shoulder region 06/09/2014   Nodule of left lung 06/09/2014   Encounter for Medicare annual wellness exam 10/30/2013   H/O fall 04/27/2013   Risk for falls 04/11/2012   Leg pain, bilateral 01/29/2012   MICROSCOPIC HEMATURIA 08/29/2009   CONDUCTIVE HEARING LOSS BILATERAL 08/08/2009   EDEMA 04/07/2009   BRADYCARDIA 10/26/2008   DYSPNEA 10/26/2008   INCONTINENCE, URGE 02/27/2008   Hyperlipidemia 10/11/2006   PERIPHERAL NEUROPATHY, LOWER EXTREMITIES, BILATERAL 10/11/2006   Essential hypertension 123XX123   DIASTOLIC DYSFUNCTION 123XX123   CAROTID STENOSIS 10/11/2006   CEREBROVASCULAR DISEASE 10/11/2006   PERIPHERAL VASCULAR DISEASE 10/11/2006   ALLERGIC RHINITIS 10/11/2006   CKD stage IIIb ([place]) 10/11/2006   OVERACTIVE BLADDER 10/11/2006   OSTEOARTHRITIS 10/11/2006   CARDIAC MURMUR, HX OF 10/11/2006     Conditions to be addressed/monitored: Dementia; Level of care concerns and Cognitive Deficits  Care Plan : LCSW Plan of Care  Updates made by Deirdre Peer, LCSW since 06/30/2021 12:00 AM     Problem: Functional Decline      Long-Range Goal: Provide resources, support and guidance for caring for patient in the home   Start Date: 05/22/2021  Expected End Date: 08/31/2021  This Visit's Progress: On track  Recent Progress: On track  Priority: High  Note:   Current barriers:   Patient in need of assistance with connecting to community resources for Level of care concerns, ADL IADL limitations, caregiver strain, Inability to perform ADL's independently, and Lacks knowledge of community resource:  Acknowledges deficits with meeting this unmet need Patient is unable to independently navigate community resource options without care coordination support Clinical Goals:  explore community resource options for unmet needs related to:  Stress, Physical Activity, and None Clinical Interventions:  Spoke with pt's daughter, Tracy Morales, who reports pt did have an ankle injury from the wheelchair- "nothing broken,  just twisted".  Pt continues to have family support and [place] (RN, PT, bath aide).  They have received the information received from the [facility] and report no questions at this time. Suggested daughter check into pt's [employer] benefits to see what extra offerings it may have. Collaboration with Tower, Wynelle Fanny, MD regarding development and update of comprehensive plan of care as evidenced by provider attestation and co-signature Inter-disciplinary care team collaboration (see longitudinal plan of care) Assessment of needs, barriers , agencies contacted, as well as how impacting  Review various resources, discussed options and provided patient information about Level of care concerns, strain and stress to caregiver, Inability to perform ADL's independently, and Lacks knowledge  of community resource:   Collaborated with appropriate clinical care team members regarding patient needs Level of care concerns, ADL IADL limitations, and Lacks knowledge of community resource:   , CAD and 88yo with hip fx and immobility Patient interviewed and appropriate assessments performed Referred patient to community resources care guide team for assistance with senior resource support (caregiver support services), private duty care agency lists Provided patient with information about in-home care needs/support and coverage Discussed plans with patient for ongoing care management follow up and provided patient with direct contact information for care management team Assisted patient/caregiver with obtaining information about health plan benefits Provided education and assistance to client regarding Advanced Directives. Provided education to patient/caregiver regarding level of care options. Other interventions provided: Solution-Focused Strategies employed:   Active listening / Reflection utilized  Problem Solving Reading strategies reviewed Caregiver stress acknowledged  Participation in counseling encouraged  Participation in support group encouraged  Consideration of in-home help encouraged  Increase in actives / exercise encouraged  Patient Goals:    -consider in-home caregiver support through private duty care agency - begin a notebook of services in my neighborhood or community - call 211 when I need some help - follow-up on any referrals for help I am given - think ahead to make sure my need does not become an emergency - make a note about what I need to have by the phone or take with me, like an identification card or social security number have a back-up plan - have a back-up plan - make a list of family or friends that I can call  -  Follow Up Plan: Appointment scheduled for SW follow up with client by phone on:  07/28/21      Follow Up Plan: Appointment  scheduled for SW follow up with client by phone on: 07/28/21      Eduard Clos MSW, [place] Licensed Clinical Social Worker [place]   [phone]

## 2021-06-30 NOTE — Patient Instructions (Signed)
Visit Information  Thank you for taking time to visit with me today. Please don't hesitate to contact me if I can be of assistance to you before our next scheduled telephone appointment.    Our next appointment is by telephone on 07/28/21 at 2pm  Please call the care guide team at [phone] if you need to cancel or reschedule your appointment.   If you are experiencing a Mental Health or Behavioral Health Crisis or need someone to talk to, please call the [place] National Suicide Prevention Lifeline: (236) 583-4521 or TTY: 705-576-0909 TTY 512-563-5647) to talk to a trained counselor go to [facility] [address], [place] [phone]) call 911   The patient verbalized understanding of instructions, educational materials, and care plan provided today and declined offer to receive copy of patient instructions, educational materials, and care plan.   Reece Levy MSW, LCSW Licensed Clinical Social Worker [facility] [place]   [phone]

## 2021-07-04 DIAGNOSIS — I10 Essential (primary) hypertension: Secondary | ICD-10-CM

## 2021-07-12 ENCOUNTER — Other Ambulatory Visit: Payer: Medicare PPO

## 2021-07-14 ENCOUNTER — Telehealth: Payer: Self-pay | Admitting: Family Medicine

## 2021-07-14 DIAGNOSIS — M25559 Pain in unspecified hip: Secondary | ICD-10-CM

## 2021-07-14 DIAGNOSIS — M545 Low back pain, unspecified: Secondary | ICD-10-CM

## 2021-07-14 DIAGNOSIS — S72009A Fracture of unspecified part of neck of unspecified femur, initial encounter for closed fracture: Secondary | ICD-10-CM

## 2021-07-14 DIAGNOSIS — Z7409 Other reduced mobility: Secondary | ICD-10-CM

## 2021-07-14 NOTE — Telephone Encounter (Signed)
I did the px  Unsure when able to print -then fax

## 2021-07-14 NOTE — Telephone Encounter (Signed)
Tracy Morales CALLED IN FROM [facility] AND THEY ARE REQUESTING A WHEELCHAIR. HE STATED THAT ONE WAS CALLED BUT NOTHING WAS EVER ORDERED. AND SHE NEEDS ONE. AND ITS NEEDING TO GO THROUGH [facility] AND THE FAX NUMBER [phone]

## 2021-07-14 NOTE — Addendum Note (Signed)
Addended by: Roxy Manns A on: 07/14/2021 03:25 PM   Modules accepted: Orders

## 2021-07-17 ENCOUNTER — Other Ambulatory Visit: Payer: Medicare PPO

## 2021-07-17 NOTE — Progress Notes (Signed)
Subjective:   Tracy Morales is a 88 y.o. female who presents for Medicare Annual (Subsequent) preventive examination.  I connected with Tracy Morales today by telephone and verified that I am speaking with the correct person using two identifiers. Location patient: home Location provider: work Persons participating in the virtual visit: patient, daughter [name],  nurse.    I discussed the limitations, risks, security and privacy concerns of performing an evaluation and management service by telephone and the availability of in person appointments. I also discussed with the patient that there may be a patient responsible charge related to this service. The patient expressed understanding and verbally consented to this telephonic visit.    Interactive audio and video telecommunications were attempted between this provider and patient, however failed, due to patient having technical difficulties OR patient did not have access to video capability.  We continued and completed visit with audio only.  Some vital signs may be absent or patient reported.   Time Spent with patient on telephone encounter: 25 minutes  Review of Systems     Cardiac Risk Factors include: advanced age (>28men, >71 women);hypertension     Objective:    Today's Vitals   07/18/21 1446  Weight: 123 lb (55.8 kg)  Height: 5\' 4"  (1.626 m)   Body mass index is 21.11 kg/m.  Advanced Directives 07/18/2021 03/18/2021 03/18/2021 02/14/2019 05/06/2018 05/02/2017 11/07/2016  Does Patient Have a Medical Advance Directive? Yes No No No No No No  Type of 01/07/2017 of [place];Living will - - - - - -  Does patient want to make changes to medical advance directive? Yes (MAU/Ambulatory/Procedural Areas - Information given) - - - - - -  Copy of Healthcare Power of Attorney in Chart? - - - - - - -  Would patient like information on creating a medical advance directive? - No - Patient declined - - No -  Patient declined - -    Current Medications (verified) Outpatient Encounter Medications as of 07/18/2021  Medication Sig   acetaminophen (TYLENOL) 500 MG tablet Take 500 mg by mouth every 6 (six) hours as needed. OTC-UAD   amLODipine (NORVASC) 5 MG tablet TAKE 1 TABLET BY MOUTH EVERY DAY   aspirin 325 MG EC tablet Take 325 mg by mouth daily.   bisacodyl (DULCOLAX) 10 MG suppository Place 1 suppository (10 mg total) rectally daily as needed for moderate constipation.   Cholecalciferol (VITAMIN D3) 2000 units capsule Take 2,000 Units by mouth daily.   ciclopirox (LOPROX) 0.77 % SUSP APPLY TO SCALP 3 TIMES WEEKLY AND LEAVE IN AS DIRECTED. NOT COVERED BY PLAN (Patient taking differently: Apply 1 application topically once a week. Usually on Saturdays)   clobetasol (TEMOVATE) 0.05 % external solution APPLY TO SCALP ONCE DAILY AS NEEDED FOR ITCH. USE ON SCALP. AVOID FACE / GROIN / UNDERARM   CRANBERRY PO Take 1 tablet by mouth daily.   docusate sodium (COLACE) 100 MG capsule Take 1 capsule (100 mg total) by mouth 2 (two) times daily.   feeding supplement (ENSURE ENLIVE / ENSURE PLUS) LIQD Take 237 mLs by mouth 2 (two) times daily between meals.   ferrous sulfate 325 (65 FE) MG tablet Take 1 tablet (325 mg total) by mouth 2 (two) times daily.   Fluocinolone Acetonide Body 0.01 % OIL Apply 1 application topically daily.   HYDROcodone-acetaminophen (NORCO/VICODIN) 5-325 MG tablet Take 0.5-1 tablets by mouth every 6 (six) hours as needed for moderate pain.   hydrocortisone  2.5 % cream apply to affected area on Eyebrows twice daily for up to 2 week. Use on FACE   ketoconazole (NIZORAL) 2 % shampoo MESSAGE INTO SCALP 3 TIMES A WEEK, LEAVE ON FOR 10 MINUTES (Patient taking differently: Apply 1 application topically once a week. Usually on Wednesdays)   Meclizine HCl (BONINE PO) Take 50 mg by mouth as needed.   mirtazapine (REMERON) 30 MG tablet TAKE 1 TABLET BY MOUTH EVERYDAY AT BEDTIME (Patient taking  differently: Take 30 mg by mouth at bedtime.)   polyethylene glycol (MIRALAX / GLYCOLAX) 17 g packet Take 17 g by mouth 2 (two) times daily.   psyllium (METAMUCIL) 58.6 % packet Take 1 packet by mouth daily as needed.   simvastatin (ZOCOR) 20 MG tablet TAKE 1 TABLET BY MOUTH EVERY DAY   No facility-administered encounter medications on file as of 07/18/2021.    Allergies (verified) Ace inhibitors, Donepezil, and Sulfa antibiotics   History: Past Medical History:  Diagnosis Date   Allergic rhinitis, cause unspecified    Bradycardia    Cerebrovascular disease, unspecified    Chronic Morales disease, unspecified    chronic renal insufficiency   Dizziness and giddiness    DVT (deep venous thrombosis) (HCC)    R   Dyspnea    echo with normal EF. 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 basilar insufficiency  2002   zoster  5/02   Family History  Problem Relation Age of Onset   Hypertension Father    Other Father        Cardiac problems   Heart disease Father    Other Brother        blood clot   Other Sister        benign breast nodule   Social History   Socioeconomic History   Marital status: Widowed    Spouse name: Not on file   Number of children: 2   Years of education: Not on file   Highest education level:  Not on file  Occupational History   Occupation: Retired  Tobacco Use   Smoking status: Never   Smokeless tobacco: Never   Tobacco comments:    non smoker   Vaping Use   Vaping Use: Never used  Substance and Sexual Activity   Alcohol use: No    Alcohol/week: 0.0 standard drinks   Drug use: No   Sexual activity: Never  Other Topics Concern   Not on file  Social History Narrative   Widowed. 2 children. Retired, gets regular exercise.    Social Determinants of Health   Financial Resource Strain: Low Risk    Difficulty of Paying Living Expenses: Not hard at all  Food Insecurity: No Food Insecurity   Worried About Programme researcher, broadcasting/film/videounning Out of Food in the Last Year: Never true   Ran Out of Food in the Last Year: Never true  Transportation Needs: No Transportation Needs   Lack of Transportation (Medical): No   Lack of Transportation (Non-Medical): No  Physical Activity: Inactive   Days of Exercise per Week: 0 days   Minutes of Exercise per Session: 0 min  Stress: No Stress Concern Present   Feeling of Stress : Only a little  Social Connections: Socially Isolated   Frequency of Communication with Friends and Family: Twice a week   Frequency of Social Gatherings with Friends and Family: More than three times a week   Attends Religious Services: Never   Database administratorActive Member of Clubs or Organizations: No   Attends BankerClub or Organization Meetings: Never    Marital Status: Widowed    Tobacco Counseling Counseling given: Not Answered Tobacco comments: non smoker    Clinical Intake:  Pre-visit preparation completed: Yes  Pain : No/denies pain     BMI - recorded: 21.11 Nutritional Status: BMI of 19-24  Normal Nutritional Risks: Other (Comment) (patient not eating well) Diabetes: No  How often do you need to have someone help you when you read instructions, pamphlets, or other written materials from your doctor or pharmacy?: 1 - Never  Diabetic? No  Interpreter Needed?: No  Information entered by :: Sharen Heckamina Briaunna Grindstaff LPN   Activities of Daily Living In your present state of health, do you have any difficulty performing the following activities: 07/18/2021 05/22/2021  Hearing? Y -  Vision? Y -  Difficulty concentrating or making decisions? Y -  Walking or climbing stairs? Y Y  Dressing or bathing? Y Y  Doing errands, shopping? Malvin JohnsY Y  Comment daughter assist -  Quarry managerreparing Food and eating ? Y Y  Using the Toilet? Y Y  In the past six months, have you accidently leaked urine? Y Y  Do you have problems with loss of bowel control? Y -  Comment incontinent -  Managing your Medications? Alpha GulaY N  Comment daughter assist -  Managing your Finances? Y Y  Comment Lynder Parentslinton Jr , daughter son -  Medical laboratory scientific officerHousekeeping or managing your Housekeeping? Malvin JohnsY Y  Some recent data might be hidden    Patient Care Team: Tower, Audrie GallusMarne A, MD as PCP - General [facility] PromiseBulakowski, [place], [place] as Consulting Physician (Optometry) Mosetta PigeonSingh, Harmeet, MD as Consulting Physician (Internal Medicine) Orson ApeWolff, Michael R, MD as Consulting Physician (Urology) Elinor ParkinsonHyatt, Max T, [place] as Consulting Physician (Podiatry) Fransico MichaelPelletier, Sarah C, MD as Referring Physician (Audiology) Buck Mamaldwell, Janet P, LCSW as Social Worker (Licensed Clinical Social Worker)  Indicate any recent Medical Services you may have received from other than [facility] providers in the past year (date may be approximate).  Assessment:    This is a routine wellness examination for Bolton.  Hearing/Vision screen Hearing Screening - Comments:: Wears hearing aids  Vision Screening - Comments:: Last exam 2022, Dr. Dion Body, wears glasses  Dietary issues and exercise activities discussed: Current Exercise Habits: The patient does not participate in regular exercise at present   Goals Addressed   None    Depression Screen PHQ 2/9 Scores 07/18/2021 05/20/2020 05/15/2019 05/06/2018 05/02/2017 04/19/2016 04/17/2016  PHQ - 2 Score 2 0 0 1 3 0 0  PHQ- 9 Score 3 - - 1 15 - -    Fall Risk Fall Risk  07/18/2021 05/20/2020 05/15/2019 05/06/2018 05/02/2017  Falls in the past year? 1 0 0 1 No  Comment - - - - -  Number falls in past yr: 0 - 0 1 -  Injury with Fall? 0 - - 1 -  Risk Factor Category  - - - - -  Risk for fall due to : Impaired mobility - - Impaired balance/gait;Impaired mobility -  Follow up Falls prevention discussed Falls evaluation completed Falls evaluation completed - -    FALL RISK PREVENTION PERTAINING TO THE HOME:  Any stairs in or around the home? No  If so, are there any without handrails? No  Home free of loose throw rugs in walkways, pet beds, electrical cords, etc? Yes  Adequate lighting in your home to reduce risk of falls? Yes   ASSISTIVE DEVICES UTILIZED TO PREVENT FALLS:  Life alert? No  Use of a cane, walker or w/c? Yes , w/c Grab bars in the bathroom? No  Shower chair or bench in shower? No  Elevated toilet seat or a handicapped toilet? No   TIMED UP AND GO:  Was the test performed? No .    Cognitive Function: Cognitive status assessed by observation. Patient has current diagnosis of mild cognitive impairment. Patient is unable to complete screening 6CIT or MMSE.   MMSE - Mini Mental State Exam 05/06/2018 05/02/2017 04/19/2016  Orientation to time 5 5 5   Orientation to Place 5 5 5   Registration 3 3 3   Attention/ Calculation 0 0 0  Recall 1 1 3   Recall-comments unable to recall 2  of 3 words; was able to recall after cues given unable  to recall 2 of 3 words -  Language- name 2 objects 0 0 0  Language- repeat 1 1 1   Language- follow 3 step command 3 3 3   Language- read & follow direction 0 0 0  Write a sentence 0 0 0  Copy design 0 0 0  Total score 18 18 20         Immunizations Immunization History  Administered Date(s) Administered   Fluad Quad(high Dose 65+) 02/20/2019, 02/17/2020, 02/17/2021   Influenza Split 03/08/2011, 02/25/2012   Influenza Whole 05/05/2004, 02/27/2008, 03/03/2009, 02/24/2010   Influenza,inj,Quad PF,6+ Mos 02/25/2013, 02/01/2014, 03/10/2015, 03/05/2016, 02/19/2017, 02/28/2018   PFIZER Comirnaty(Gray Top)Covid-19 Tri-Sucrose Vaccine 09/13/2020   PFIZER(Purple Top)SARS-COV-2 Vaccination 07/19/2019, 08/18/2019, 04/07/2020   PPD Test 06/09/2014   Pfizer Covid-19 Vaccine Bivalent Booster 62yrs & up 03/09/2021   Pneumococcal Conjugate-13 10/30/2013   Pneumococcal Polysaccharide-23 05/06/2002   Td 05/06/2002   Tdap 10/11/2011   Zoster Recombinat (Shingrix) 06/24/2018    TDAP status: Up to date  Flu Vaccine status: Up to date  Pneumococcal vaccine status: Up to date  Covid-19 vaccine status: Completed vaccines  Qualifies for Shingles Vaccine? Yes   Zostavax completed No   Shingrix Completed?: No.    Education  has been provided regarding the importance of this vaccine. Patient has been advised to call insurance company to determine out of pocket expense if they have not yet received this vaccine. Advised may also receive vaccine at local pharmacy or Health Dept. Verbalized acceptance and understanding.  Screening Tests Health Maintenance  Topic Date Due   URINE MICROALBUMIN  Never done   Zoster Vaccines- Shingrix (2 of 2) 08/19/2018   TETANUS/TDAP  10/10/2021   Pneumonia Vaccine 36+ Years old  Completed   INFLUENZA VACCINE  Completed   DEXA SCAN  Completed   COVID-19 Vaccine  Completed   HPV VACCINES  Aged Out    Health  Maintenance  Health Maintenance Due  Topic Date Due   URINE MICROALBUMIN  Never done   Zoster Vaccines- Shingrix (2 of 2) 08/19/2018    Colorectal cancer screening: No longer required.   Mammogram status: No longer required due to patient is 88 years of age.  Bone Density status: No longer required   Lung Cancer Screening: (Low Dose CT Chest recommended if Age 3-80 years, 30 pack-year currently smoking OR have quit w/in 15years.) does not qualify.     Additional Screening:  Hepatitis C Screening: does not qualify  Vision Screening: Recommended annual ophthalmology exams for early detection of glaucoma and other disorders of the eye. Is the patient up to date with their annual eye exam?  Yes  Who is the provider or what is the name of the office in which the patient attends annual eye exams? [facility]    Dental Screening: Recommended annual dental exams for proper oral hygiene  Community Resource Referral / Chronic Care Management: CRR required this visit?  No   CCM required this visit?  No      Plan:     I have personally reviewed and noted the following in the patients chart:   Medical and social history Use of alcohol, tobacco or illicit drugs  Current medications and supplements including opioid prescriptions.  Functional ability and status Nutritional status Physical activity Advanced directives List of other physicians Hospitalizations, surgeries, and ER visits in previous 12 months Vitals Screenings to include cognitive, depression, and falls Referrals and appointments  In addition, I have reviewed and discussed with patient certain preventive protocols, quality metrics, and best practice recommendations. A written personalized care plan for preventive services as well as general preventive health recommendations were provided to patient.   Due to this being a telephonic visit, the after visit summary with patients personalized plan was offered to  patient via mail or my-chart.  Patient preferred to pick up at office at next visit.    Janne Lab, LPN   [id number]   Nurse Health Advisor  Nurse Notes: none

## 2021-07-18 ENCOUNTER — Ambulatory Visit (INDEPENDENT_AMBULATORY_CARE_PROVIDER_SITE_OTHER): Payer: Medicare PPO

## 2021-07-18 ENCOUNTER — Ambulatory Visit: Payer: Medicare PPO | Admitting: Podiatry

## 2021-07-18 VITALS — Ht 64.0 in | Wt 123.0 lb

## 2021-07-18 DIAGNOSIS — Z0000 Encounter for general adult medical examination without abnormal findings: Secondary | ICD-10-CM

## 2021-07-18 NOTE — Patient Instructions (Signed)
Tracy Morales , Thank you for taking time to come for your Medicare Wellness Visit. I appreciate your ongoing commitment to your health goals. Please review the following plan we discussed and let me know if I can assist you in the future.   Screening recommendations/referrals: Colonoscopy: no longer required  Mammogram: no longer required  Bone Density: no longer required  Recommended yearly ophthalmology/optometry visit for glaucoma screening and checkup Recommended yearly dental visit for hygiene and checkup  Vaccinations: Influenza vaccine: up to date  Pneumococcal vaccine: up to date  Tdap vaccine: up to date  Shingles vaccine: 1st dose received 06/24/18   Covid-19:up to date   Advanced directives: Please bring a copy of Living Will and/or Healthcare Power of Attorney for your chart.   Conditions/risks identified: see problem list   Next appointment: Follow up in one year for your annual wellness visit    Preventive Care 65 Years and Older, Female Preventive care refers to lifestyle choices and visits with your health care provider that can promote health and wellness. What does preventive care include? A yearly physical exam. This is also called an annual well check. Dental exams once or twice a year. Routine eye exams. Ask your health care provider how often you should have your eyes checked. Personal lifestyle choices, including: Daily care of your teeth and gums. Regular physical activity. Eating a healthy diet. Avoiding tobacco and drug use. Limiting alcohol use. Practicing safe sex. Taking low-dose aspirin every day. Taking vitamin and mineral supplements as recommended by your health care provider. What happens during an annual well check? The services and screenings done by your health care provider during your annual well check will depend on your age, overall health, lifestyle risk factors, and family history of disease. Counseling  Your health care provider may  ask you questions about your: Alcohol use. Tobacco use. Drug use. Emotional well-being. Home and relationship well-being. Sexual activity. Eating habits. History of falls. Memory and ability to understand (cognition). Work and work Astronomer. Reproductive health. Screening  You may have the following tests or measurements: Height, weight, and BMI. Blood pressure. Lipid and cholesterol levels. These may be checked every 5 years, or more frequently if you are over 18 years old. Skin check. Lung cancer screening. You may have this screening every year starting at age 47 if you have a 30-pack-year history of smoking and currently smoke or have quit within the past 15 years. Fecal occult blood test (FOBT) of the stool. You may have this test every year starting at age 28. Flexible sigmoidoscopy or colonoscopy. You may have a sigmoidoscopy every 5 years or a colonoscopy every 10 years starting at age 12. Hepatitis C blood test. Hepatitis B blood test. Sexually transmitted disease (STD) testing. Diabetes screening. This is done by checking your blood sugar (glucose) after you have not eaten for a while (fasting). You may have this done every 1-3 years. Bone density scan. This is done to screen for osteoporosis. You may have this done starting at age 54. Mammogram. This may be done every 1-2 years. Talk to your health care provider about how often you should have regular mammograms. Talk with your health care provider about your test results, treatment options, and if necessary, the need for more tests. Vaccines  Your health care provider may recommend certain vaccines, such as: Influenza vaccine. This is recommended every year. Tetanus, diphtheria, and acellular pertussis (Tdap, Td) vaccine. You may need a Td booster every 10 years. Zoster vaccine.  You may need this after age 46. Pneumococcal 13-valent conjugate (PCV13) vaccine. One dose is recommended after age 38. Pneumococcal  polysaccharide (PPSV23) vaccine. One dose is recommended after age 62. Talk to your health care provider about which screenings and vaccines you need and how often you need them. This information is not intended to replace advice given to you by your health care provider. Make sure you discuss any questions you have with your health care provider. Document Released: 06/17/2015 Document Revised: 02/08/2016 Document Reviewed: 03/22/2015 Elsevier Interactive Patient Education  2017 [employer].  Fall Prevention in the Home Falls can cause injuries. They can happen to people of all ages. There are many things you can do to make your home safe and to help prevent falls. What can I do on the outside of my home? Regularly fix the edges of walkways and driveways and fix any cracks. Remove anything that might make you trip as you walk through a door, such as a raised step or threshold. Trim any bushes or trees on the path to your home. Use bright outdoor lighting. Clear any walking paths of anything that might make someone trip, such as rocks or tools. Regularly check to see if handrails are loose or broken. Make sure that both sides of any steps have handrails. Any raised decks and porches should have guardrails on the edges. Have any leaves, snow, or ice cleared regularly. Use sand or salt on walking paths during winter. Clean up any spills in your garage right away. This includes oil or grease spills. What can I do in the bathroom? Use night lights. Install grab bars by the toilet and in the tub and shower. Do not use towel bars as grab bars. Use non-skid mats or decals in the tub or shower. If you need to sit down in the shower, use a plastic, non-slip stool. Keep the floor dry. Clean up any water that spills on the floor as soon as it happens. Remove soap buildup in the tub or shower regularly. Attach bath mats securely with double-sided non-slip rug tape. Do not have throw rugs and other  things on the floor that can make you trip. What can I do in the bedroom? Use night lights. Make sure that you have a light by your bed that is easy to reach. Do not use any sheets or blankets that are too big for your bed. They should not hang down onto the floor. Have a firm chair that has side arms. You can use this for support while you get dressed. Do not have throw rugs and other things on the floor that can make you trip. What can I do in the kitchen? Clean up any spills right away. Avoid walking on wet floors. Keep items that you use a lot in easy-to-reach places. If you need to reach something above you, use a strong step stool that has a grab bar. Keep electrical cords out of the way. Do not use floor polish or wax that makes floors slippery. If you must use wax, use non-skid floor wax. Do not have throw rugs and other things on the floor that can make you trip. What can I do with my stairs? Do not leave any items on the stairs. Make sure that there are handrails on both sides of the stairs and use them. Fix handrails that are broken or loose. Make sure that handrails are as long as the stairways. Check any carpeting to make sure that it is  firmly attached to the stairs. Fix any carpet that is loose or worn. Avoid having throw rugs at the top or bottom of the stairs. If you do have throw rugs, attach them to the floor with carpet tape. Make sure that you have a light switch at the top of the stairs and the bottom of the stairs. If you do not have them, ask someone to add them for you. What else can I do to help prevent falls? Wear shoes that: Do not have high heels. Have rubber bottoms. Are comfortable and fit you well. Are closed at the toe. Do not wear sandals. If you use a stepladder: Make sure that it is fully opened. Do not climb a closed stepladder. Make sure that both sides of the stepladder are locked into place. Ask someone to hold it for you, if possible. Clearly  mark and make sure that you can see: Any grab bars or handrails. First and last steps. Where the edge of each step is. Use tools that help you move around (mobility aids) if they are needed. These include: Canes. Walkers. Scooters. Crutches. Turn on the lights when you go into a dark area. Replace any light bulbs as soon as they burn out. Set up your furniture so you have a clear path. Avoid moving your furniture around. If any of your floors are uneven, fix them. If there are any pets around you, be aware of where they are. Review your medicines with your doctor. Some medicines can make you feel dizzy. This can increase your chance of falling. Ask your doctor what other things that you can do to help prevent falls. This information is not intended to replace advice given to you by your health care provider. Make sure you discuss any questions you have with your health care provider. Document Released: 03/17/2009 Document Revised: 10/27/2015 Document Reviewed: 06/25/2014 Elsevier Interactive Patient Education  2017 [employer].

## 2021-07-19 ENCOUNTER — Encounter: Payer: Self-pay | Admitting: Family Medicine

## 2021-07-19 ENCOUNTER — Ambulatory Visit (INDEPENDENT_AMBULATORY_CARE_PROVIDER_SITE_OTHER): Payer: Medicare PPO | Admitting: Family Medicine

## 2021-07-19 ENCOUNTER — Other Ambulatory Visit: Payer: Self-pay

## 2021-07-19 VITALS — BP 118/70 | HR 71 | Temp 97.9°F

## 2021-07-19 DIAGNOSIS — D500 Iron deficiency anemia secondary to blood loss (chronic): Secondary | ICD-10-CM

## 2021-07-19 DIAGNOSIS — I10 Essential (primary) hypertension: Secondary | ICD-10-CM

## 2021-07-19 DIAGNOSIS — G3184 Mild cognitive impairment, so stated: Secondary | ICD-10-CM | POA: Diagnosis not present

## 2021-07-19 DIAGNOSIS — Z0000 Encounter for general adult medical examination without abnormal findings: Secondary | ICD-10-CM

## 2021-07-19 DIAGNOSIS — N1832 Chronic kidney disease, stage 3b: Secondary | ICD-10-CM | POA: Diagnosis not present

## 2021-07-19 DIAGNOSIS — R7309 Other abnormal glucose: Secondary | ICD-10-CM | POA: Diagnosis not present

## 2021-07-19 DIAGNOSIS — F341 Dysthymic disorder: Secondary | ICD-10-CM

## 2021-07-19 DIAGNOSIS — E7800 Pure hypercholesterolemia, unspecified: Secondary | ICD-10-CM | POA: Diagnosis not present

## 2021-07-19 DIAGNOSIS — S72321S Displaced transverse fracture of shaft of right femur, sequela: Secondary | ICD-10-CM | POA: Diagnosis not present

## 2021-07-19 DIAGNOSIS — R339 Retention of urine, unspecified: Secondary | ICD-10-CM

## 2021-07-19 DIAGNOSIS — Z7409 Other reduced mobility: Secondary | ICD-10-CM | POA: Diagnosis not present

## 2021-07-19 LAB — COMPREHENSIVE METABOLIC PANEL
ALT: 8 U/L (ref 0–35)
AST: 22 U/L (ref 0–37)
Albumin: 3.7 g/dL (ref 3.5–5.2)
Alkaline Phosphatase: 103 U/L (ref 39–117)
BUN: 26 mg/dL — ABNORMAL HIGH (ref 6–23)
CO2: 25 mEq/L (ref 19–32)
Calcium: 9.3 mg/dL (ref 8.4–10.5)
Chloride: 98 mEq/L (ref 96–112)
Creatinine, Ser: 1.35 mg/dL — ABNORMAL HIGH (ref 0.40–1.20)
GFR: 32.41 mL/min — ABNORMAL LOW (ref >60.00)
Glucose, Bld: 100 mg/dL — ABNORMAL HIGH (ref 70–99)
Potassium: 4.3 mEq/L (ref 3.5–5.1)
Sodium: 135 mEq/L (ref 135–145)
Total Bilirubin: 0.4 mg/dL (ref 0.2–1.2)
Total Protein: 7.7 g/dL (ref 6.0–8.3)

## 2021-07-19 LAB — CBC WITH DIFFERENTIAL/PLATELET
Basophils Absolute: 0.1 10*3/uL (ref 0.0–0.1)
Basophils Relative: 1.1 % (ref 0.0–3.0)
Eosinophils Absolute: 0.1 10*3/uL (ref 0.0–0.7)
Eosinophils Relative: 1.3 % (ref 0.0–5.0)
HCT: 40.1 % (ref 36.0–46.0)
Hemoglobin: 13 g/dL (ref 12.0–15.0)
Lymphocytes Relative: 37.7 % (ref 12.0–46.0)
Lymphs Abs: 1.9 10*3/uL (ref 0.7–4.0)
MCHC: 32.4 g/dL (ref 30.0–36.0)
MCV: 84.6 fl (ref 78.0–100.0)
Monocytes Absolute: 0.6 10*3/uL (ref 0.1–1.0)
Monocytes Relative: 12.1 % — ABNORMAL HIGH (ref 3.0–12.0)
Neutro Abs: 2.4 10*3/uL (ref 1.4–7.7)
Neutrophils Relative %: 47.8 % (ref 43.0–77.0)
Platelets: 253 10*3/uL (ref 150.0–400.0)
RBC: 4.74 Mil/uL (ref 3.87–5.11)
RDW: 16.3 % — ABNORMAL HIGH (ref 11.5–15.5)
WBC: 5.1 10*3/uL (ref 4.0–10.5)

## 2021-07-19 LAB — LIPID PANEL
Cholesterol: 174 mg/dL (ref 0–200)
HDL: 44.8 mg/dL (ref >39.00)
LDL Cholesterol: 116 mg/dL — ABNORMAL HIGH (ref 0–99)
NonHDL: 129.41
Total CHOL/HDL Ratio: 4
Triglycerides: 66 mg/dL (ref 0.0–149.0)
VLDL: 13.2 mg/dL (ref 0.0–40.0)

## 2021-07-19 LAB — TSH: TSH: 3.98 u[IU]/mL (ref 0.35–5.50)

## 2021-07-19 LAB — IRON: Iron: 57 ug/dL (ref 42–145)

## 2021-07-19 MED ORDER — MIRTAZAPINE 45 MG PO TABS: 45.0000 mg | ORAL_TABLET | Freq: Every day | ORAL | 3 refills | Status: DC

## 2021-07-19 NOTE — Assessment & Plan Note (Signed)
Declines breast or colon cancer screen of dexa due to age and mobility issues  Rev MMSE and amw  Needs 2nd shingrix vaccine  utd other imms  Getting help at home

## 2021-07-19 NOTE — Assessment & Plan Note (Signed)
Continues ortho care and PT Has not been able to use leg well/despite PT  Is pretty much wheelchair bound They are still working with her

## 2021-07-19 NOTE — Telephone Encounter (Signed)
Order printed but when I tried fax it to fax # left in VM it didn't go through, fax failed multiple times to go through.   Called Jim ([facility]) and left VM letting him know and asked if they have an alt fax # or if the fax # provided was incorrect.  **IF JIM CALLS BACK PLEASE GET A FAX # FROM HIM SO I CAN FAX WHEELCHAIR ORDER**

## 2021-07-19 NOTE — Assessment & Plan Note (Signed)
bp in fair control at this time  BP Readings from Last 1 Encounters:  07/19/21 118/70   No changes needed Most recent labs reviewed  Disc lifstyle change with low sodium diet and exercise  Labs ordered Plan to continue amlodipine 5 mg daily

## 2021-07-19 NOTE — Assessment & Plan Note (Signed)
Continues foley cath

## 2021-07-19 NOTE — Assessment & Plan Note (Signed)
Worse since hip fx, she is frustrated from her limitations and now less motivated Disc imp of self care and activity as tolerated Increased her mirtazpine to 45 mg qhs Disc pot side eff Should help sleep and appetite as well  Discussed expectations of this medication including time to effectiveness and mechanism of action, also poss of side effects (early and late)- including mental fuzziness, weight or appetite change, nausea and poss of worse dep or anxiety (even suicidal thoughts)  Pt voiced understanding and will stop med and update if this occurs  Will update

## 2021-07-19 NOTE — Assessment & Plan Note (Signed)
Did not tolerate aricept Fairly stable

## 2021-07-19 NOTE — Assessment & Plan Note (Signed)
Still working with PT Not using leg well (hip fx) Wheelchair bound Disc fall prevention

## 2021-07-19 NOTE — Progress Notes (Signed)
Subjective:    Patient ID: Tracy Morales, female    DOB: 1919-09-02, 88 y.o.   MRN: [id number]  This visit occurred during the SARS-CoV-2 public health emergency.  Safety protocols were in place, including screening questions prior to the visit, additional usage of staff PPE, and extensive cleaning of exam room while observing appropriate contact time as indicated for disinfecting solutions.   HPI Here for health maintenance exam and to review chronic medical problems    Wt Readings from Last 3 Encounters:  07/18/21 123 lb (55.8 kg)  04/11/21 123 lb (55.8 kg)  03/18/21 123 lb 7.3 oz (56 kg)    Still does not use the leg after hip fracture and surgery  Very limited  Let hurts at times   Not a lot of appetite  Therapist comes- helps a little bit / she is supposed to do herself   More down -lost interested  Is frustrated   Wheelchair  Only uses walker when therapist is there  Gets out of breath with much exertion    Had one shingrix vaccine  Other imms utd  Had bivalent covid booster  Amw was done on 2/14 MMSE 18 (unable to recall 2 of 3 words)-same as several years ago  took aricept 5 mg at bedtime but had to stop since it gave her diarrhea  Cancer screening- out aged  Trying to keep a balance    Dexa-declines due to age (too hard to move)  Had a femur fracture in the past year Several falls Had a fall trying to get out of wheelchair but did not hurt herself  Mobility impaired with help at home  Takes vitamin D   Sleep is fair  Some in chair  Her toe and leg hurt at times  Hard to roll over   Pressure ulcer is better  Home nurse checks   Has her advance directive done and updated today  HTN bp is stable today  No cp or palpitations or headaches or edema  No side effects to medicines  BP Readings from Last 3 Encounters:  07/19/21 118/70  04/19/21 (!) 144/78  04/11/21 126/82    Amlodipine 5 mg daily    CKD Lab Results  Component Value Date    CREATININE 1.32 (H) 04/11/2021   BUN 36 (H) 04/11/2021   NA 136 04/11/2021   K 4.4 04/11/2021   CL 102 04/11/2021   CO2 26 04/11/2021    Blood glucose Lab Results  Component Value Date   HGBA1C 5.9 05/18/2020   Hyperlipidemia Lab Results  Component Value Date   CHOL 174 05/18/2020   HDL 37.70 (L) 05/18/2020   LDLCALC 123 (H) 05/18/2020   TRIG 67.0 05/18/2020   CHOLHDL 5 05/18/2020   Takes simvastatin 20 mg daily   Iron def anemia after hip surg Lab Results  Component Value Date   WBC 5.4 04/11/2021   HGB 10.1 (L) 04/11/2021   HCT 31.5 (L) 04/11/2021   MCV 90.4 04/11/2021   PLT 367.0 04/11/2021   Oral iron bid  Chronic constipation  Uses metamucil  Miralax when needed Prune juice  Occ suppository  Making effort to drink more fluids (gets tired of water)   Patient Active Problem List   Diagnosis Date Noted   Dysthymia 07/19/2021   Hematoma 04/19/2021   Urinary retention 04/11/2021   Iron deficiency anemia 04/11/2021   Malnutrition of moderate degree 03/20/2021   ABLA 03/19/2021   DNR (do not resuscitate) 03/18/2021  Closed displaced transverse fracture of shaft of right femur ([name])    Fall    COVID-19 01/19/2021   Pincer nail deformity 07/04/2020   Phlebitis 05/20/2020   Left leg pain 02/19/2020   Ankle sprain 01/15/2020   Seborrheic dermatitis of scalp 08/27/2019   Elevated random blood glucose level 05/11/2019   Pain due to onychomycosis of toenails of both feet 12/04/2018   Recurrent UTI 09/22/2018   Low back pain 09/15/2018   MCI (mild cognitive impairment) 05/11/2018   Frequent urination 02/28/2018   Mobility impaired 05/06/2017   Osteoarthritis of both hips 04/15/2015   Routine general medical examination at a health care facility 04/12/2015   Hair loss 11/19/2014   Loss of weight 06/09/2014   Arthritis of right shoulder region 06/09/2014   Nodule of left lung 06/09/2014   Encounter for Medicare annual wellness exam 10/30/2013   H/O fall  04/27/2013   Risk for falls 04/11/2012   Leg pain, bilateral 01/29/2012   MICROSCOPIC HEMATURIA 08/29/2009   CONDUCTIVE HEARING LOSS BILATERAL 08/08/2009   EDEMA 04/07/2009   BRADYCARDIA 10/26/2008   DYSPNEA 10/26/2008   INCONTINENCE, URGE 02/27/2008   Hyperlipidemia 10/11/2006   PERIPHERAL NEUROPATHY, LOWER EXTREMITIES, BILATERAL 10/11/2006   Essential hypertension 123XX123   DIASTOLIC DYSFUNCTION 123XX123   CAROTID STENOSIS 10/11/2006   CEREBROVASCULAR DISEASE 10/11/2006   PERIPHERAL VASCULAR DISEASE 10/11/2006   ALLERGIC RHINITIS 10/11/2006   CKD stage IIIb ([place]) 10/11/2006   OVERACTIVE BLADDER 10/11/2006   OSTEOARTHRITIS 10/11/2006   CARDIAC MURMUR, HX OF 10/11/2006   Past Medical History:  Diagnosis Date   Allergic rhinitis, cause unspecified    Bradycardia    Cerebrovascular disease, unspecified    Chronic kidney disease, unspecified    chronic renal insufficiency   Dizziness and giddiness    DVT (deep venous thrombosis) (HCC)    R   Dyspnea    echo with normal EF. 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   HTN (hypertension)    Hyperlipidemia    Hypertonicity of bladder    hx   Mild depression    Mononeuritis of lower limb, unspecified    peripheral neuropathy, bilateral   Occlusion and stenosis of carotid artery without mention of cerebral infarction 09/03/2007   u/s L 40-59%, R 0-39%; stable for many years   Osteoarthrosis, unspecified whether generalized or localized, unspecified site    Peripheral vascular disease, unspecified ([place])    Polymyalgia rheumatica ([place])    hx   Past Surgical History:  Procedure Laterality Date   2D echo  8/06   mild aortic stenosis.    arterial doppler     carotid doppler  4/02   bilateral plaque    carotid doppler  8/06   non significant stenosis   carotid doppler     R 40%; L 40-60%   CATARACT EXTRACTION     CHOLECYSTECTOMY  2000   COLONOSCOPY  1/04   polyps   CT SCAN  05/25/08    abd and pelvis- inflated urinary bladder and large amt stool throughout w/o blockage    dexa  2/01   osteopenia   echo     mild LVH EF 50% As   FEMUR IM NAIL Right 03/18/2021   Procedure: INTRAMEDULLARY (IM) NAIL FEMORAL;  Surgeon: Corky Mull, MD;  Location: [facility] ORS;  Service: Orthopedics;  Laterality: Right;   triger finger contracture  6/04   vert. basilar insufficiency  2002   zoster  5/02  Social History   Tobacco Use   Smoking status: Never   Smokeless tobacco: Never   Tobacco comments:    non smoker   Vaping Use   Vaping Use: Never used  Substance Use Topics   Alcohol use: No    Alcohol/week: 0.0 standard drinks   Drug use: No   Family History  Problem Relation Age of Onset   Hypertension Father    Other Father        Cardiac problems   Heart disease Father    Other Brother        blood clot   Other Sister        benign breast nodule   Allergies  Allergen Reactions   Ace Inhibitors     REACTION: cough   Donepezil Diarrhea and Other (See Comments)    GI problems Other reaction(s): Other (See Comments) GI problems   Sulfa Antibiotics Nausea Only    headache   Current Outpatient Medications on File Prior to Visit  Medication Sig Dispense Refill   acetaminophen (TYLENOL) 500 MG tablet Take 500 mg by mouth every 6 (six) hours as needed. OTC-UAD     amLODipine (NORVASC) 5 MG tablet TAKE 1 TABLET BY MOUTH EVERY DAY 90 tablet 0   aspirin 325 MG EC tablet Take 325 mg by mouth daily.     Cholecalciferol (VITAMIN D3) 2000 units capsule Take 2,000 Units by mouth daily.     CRANBERRY PO Take 1 tablet by mouth daily.     feeding supplement (ENSURE ENLIVE / ENSURE PLUS) LIQD Take 237 mLs by mouth 2 (two) times daily between meals. 237 mL 12   ferrous sulfate 325 (65 FE) MG tablet Take 1 tablet (325 mg total) by mouth 2 (two) times daily. 60 tablet 11   Fluocinolone Acetonide Body 0.01 % OIL Apply 1 application topically daily. 120 mL 5   Meclizine HCl (BONINE PO)  Take 50 mg by mouth as needed.     polyethylene glycol (MIRALAX / GLYCOLAX) 17 g packet Take 17 g by mouth 2 (two) times daily. 14 each 0   psyllium (METAMUCIL) 58.6 % packet Take 1 packet by mouth daily as needed.     simvastatin (ZOCOR) 20 MG tablet TAKE 1 TABLET BY MOUTH EVERY DAY 90 tablet 0   No current facility-administered medications on file prior to visit.    Review of Systems  Constitutional:  Positive for fatigue. Negative for activity change, appetite change, fever and unexpected weight change.  HENT:  Negative for congestion, ear pain, rhinorrhea, sinus pressure and sore throat.   Eyes:  Negative for pain, redness and visual disturbance.  Respiratory:  Negative for cough, shortness of breath and wheezing.   Cardiovascular:  Negative for chest pain and palpitations.  Gastrointestinal:  Negative for abdominal pain, blood in stool, constipation and diarrhea.  Endocrine: Negative for polydipsia and polyuria.  Genitourinary:  Negative for dysuria, frequency and urgency.  Musculoskeletal:  Positive for arthralgias and back pain. Negative for joint swelling and myalgias.  Skin:  Negative for pallor and rash.  Allergic/Immunologic: Negative for environmental allergies.  Neurological:  Negative for dizziness, syncope and headaches.  Hematological:  Negative for adenopathy. Does not bruise/bleed easily.  Psychiatric/Behavioral:  Positive for decreased concentration, dysphoric mood and sleep disturbance. Negative for suicidal ideas. The patient is not nervous/anxious.       Objective:   Physical Exam Constitutional:      General: She is not in acute distress.  Appearance: Normal appearance. She is well-developed and normal weight. She is not ill-appearing or diaphoretic.     Comments: Frail appearing elderly female in wheel chair   HENT:     Head: Normocephalic and atraumatic.     Right Ear: Tympanic membrane, ear canal and external ear normal.     Left Ear: Tympanic membrane,  ear canal and external ear normal.     Nose: Nose normal. No congestion.     Mouth/Throat:     Mouth: Mucous membranes are moist.     Pharynx: Oropharynx is clear. No posterior oropharyngeal erythema.  Eyes:     General: No scleral icterus.    Extraocular Movements: Extraocular movements intact.     Conjunctiva/sclera: Conjunctivae normal.     Pupils: Pupils are equal, round, and reactive to light.  Neck:     Thyroid: No thyromegaly.     Vascular: No carotid bruit or JVD.  Cardiovascular:     Rate and Rhythm: Normal rate and regular rhythm.     Pulses: Normal pulses.     Heart sounds: Normal heart sounds.    No gallop.  Pulmonary:     Effort: Pulmonary effort is normal. No respiratory distress.     Breath sounds: Normal breath sounds. No wheezing.     Comments: Good air exch Chest:     Chest wall: No tenderness.  Abdominal:     General: Bowel sounds are normal. There is no distension or abdominal bruit.     Palpations: Abdomen is soft. There is no mass.     Tenderness: There is no abdominal tenderness.     Hernia: No hernia is present.  Genitourinary:    Comments: Breast exam: No mass, nodules, thickening, tenderness, bulging, retraction, inflamation, nipple discharge or skin changes noted.  No axillary or clavicular LA.     Musculoskeletal:        General: No tenderness. Normal range of motion.     Cervical back: Normal range of motion and neck supple. No rigidity. No muscular tenderness.     Right lower leg: No edema.     Left lower leg: No edema.     Comments: Kyphosis   Lymphadenopathy:     Cervical: No cervical adenopathy.  Skin:    General: Skin is warm and dry.     Coloration: Skin is not pale.     Findings: No erythema or rash.     Comments: Lentigines and tags  Neurological:     Mental Status: She is alert. Mental status is at baseline.     Cranial Nerves: No cranial nerve deficit.     Motor: No abnormal muscle tone.     Coordination: Coordination normal.      Gait: Gait normal.     Deep Tendon Reflexes: Reflexes are normal and symmetric. Reflexes normal.  Psychiatric:        Mood and Affect: Mood normal.        Behavior: Behavior normal.     Comments: Hard of hearing  Answers quest appropriately  Is hOH          Assessment & Plan:   Problem List Items Addressed This Visit       Cardiovascular and Mediastinum   Essential hypertension    bp in fair control at this time  BP Readings from Last 1 Encounters:  07/19/21 118/70  No changes needed Most recent labs reviewed  Disc lifstyle change with low sodium diet and exercise  Labs ordered Plan  to continue amlodipine 5 mg daily          Musculoskeletal and Integument   Closed displaced transverse fracture of shaft of right femur ([place])    Continues ortho care and PT Has not been able to use leg well/despite PT  Is pretty much wheelchair bound They are still working with her        Genitourinary   CKD stage IIIb ([place])    Lab today  Family encourages fluid intake       Urinary retention    Continues foley cath        Other   Dysthymia    Worse since hip fx, she is frustrated from her limitations and now less motivated Disc imp of self care and activity as tolerated Increased her mirtazpine to 45 mg qhs Disc pot side eff Should help sleep and appetite as well  Discussed expectations of this medication including time to effectiveness and mechanism of action, also poss of side effects (early and late)- including mental fuzziness, weight or appetite change, nausea and poss of worse dep or anxiety (even suicidal thoughts)  Pt voiced understanding and will stop med and update if this occurs  Will update       Relevant Medications   mirtazapine (REMERON) 45 MG tablet   Elevated random blood glucose level    A1C today  Encouraging protein calories      Hyperlipidemia    Disc goals for lipids and reasons to control them Rev last labs with pt Rev low sat fat diet in  detail Labs ordered  She tolerates simvastatin 20 mg daily       Iron deficiency anemia    Lab today  Taking iron bid   Is constipating for her  Would like to reduce dose if able      MCI (mild cognitive impairment)    Did not tolerate aricept Fairly stable       Mobility impaired    Still working with PT Not using leg well (hip fx) Wheelchair bound Disc fall prevention       Routine general medical examination at a health care facility - Primary    Declines breast or colon cancer screen of dexa due to age and mobility issues  Rev MMSE and amw  Needs 2nd shingrix vaccine  utd other imms  Getting help at home

## 2021-07-19 NOTE — Assessment & Plan Note (Signed)
Lab today  Taking iron bid   Is constipating for her  Would like to reduce dose if able

## 2021-07-19 NOTE — Assessment & Plan Note (Signed)
Lab today  Family encourages fluid intake

## 2021-07-19 NOTE — Patient Instructions (Addendum)
Always have someone around when you change position   Keep doing your therapy   Let's go up on mirtazapine to 45 mg at bedtime This is for sleep and appetite and mood  If any side effects or problems please let us know   Snacks are fine  Working on getting protein in (nut butters are good)  Encourage good fluid intake also   See the foot doctor tomorrow as planned   Labs today

## 2021-07-19 NOTE — Assessment & Plan Note (Signed)
Disc goals for lipids and reasons to control them °Rev last labs with pt °Rev low sat fat diet in detail °Labs ordered  °She tolerates simvastatin 20 mg daily  °

## 2021-07-19 NOTE — Assessment & Plan Note (Signed)
A1C today  Encouraging protein calories

## 2021-07-20 ENCOUNTER — Ambulatory Visit: Payer: Medicare PPO | Admitting: Podiatry

## 2021-07-20 DIAGNOSIS — L97512 Non-pressure chronic ulcer of other part of right foot with fat layer exposed: Secondary | ICD-10-CM

## 2021-07-20 DIAGNOSIS — E1149 Type 2 diabetes mellitus with other diabetic neurological complication: Secondary | ICD-10-CM

## 2021-07-20 LAB — HEMOGLOBIN A1C: Hgb A1c MFr Bld: 6 % (ref 4.6–6.5)

## 2021-07-20 MED ORDER — ERYTHROMYCIN 2 % EX PADS: 60.0000 | MEDICATED_PAD | CUTANEOUS | 2 refills | Status: AC

## 2021-07-25 ENCOUNTER — Encounter: Payer: Self-pay | Admitting: Podiatry

## 2021-07-25 NOTE — Progress Notes (Signed)
Subjective:  Patient ID: Tracy Morales, female    DOB: [birth year],  MRN: [id number]  Chief Complaint  Patient presents with   Wound Check    88 y.o. female presents for wound care.  Patient presents with concern for right first interspace wound probing down to deep tissue.  She states the pain is better.  The Betadine wet-to-dry dressing definitely helped.  She would like to discuss next treatment plans..   Review of Systems: Negative except as noted in the HPI. Denies N/V/F/Ch.  Past Medical History:  Diagnosis Date   Allergic rhinitis, cause unspecified    Bradycardia    Cerebrovascular disease, unspecified    Chronic kidney disease, unspecified    chronic renal insufficiency   Dizziness and giddiness    DVT (deep venous thrombosis) (HCC)    R   Dyspnea    echo with normal EF. 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   HTN (hypertension)    Hyperlipidemia    Hypertonicity of bladder    hx   Mild depression    Mononeuritis of lower limb, unspecified    peripheral neuropathy, bilateral   Occlusion and stenosis of carotid artery without mention of cerebral infarction 09/03/2007   u/s L 40-59%, R 0-39%; stable for many years   Osteoarthrosis, unspecified whether generalized or localized, unspecified site    Peripheral vascular disease, unspecified ([facility])    Polymyalgia rheumatica ([facility])    hx    Current Outpatient Medications:    acetaminophen (TYLENOL) 500 MG tablet, Take 500 mg by mouth every 6 (six) hours as needed. OTC-UAD, Disp: , Rfl:    amLODipine (NORVASC) 5 MG tablet, TAKE 1 TABLET BY MOUTH EVERY DAY, Disp: 90 tablet, Rfl: 0   aspirin 325 MG EC tablet, Take 325 mg by mouth daily., Disp: , Rfl:    Cholecalciferol (VITAMIN D3) 2000 units capsule, Take 2,000 Units by mouth daily., Disp: , Rfl:    CRANBERRY PO, Take 1 tablet by mouth daily., Disp: , Rfl:    feeding supplement (ENSURE ENLIVE / ENSURE PLUS) LIQD, Take 237 mLs by mouth 2  (two) times daily between meals., Disp: 237 mL, Rfl: 12   ferrous sulfate 325 (65 FE) MG tablet, Take 1 tablet (325 mg total) by mouth 2 (two) times daily., Disp: 60 tablet, Rfl: 11   Fluocinolone Acetonide Body 0.01 % OIL, Apply 1 application topically daily., Disp: 120 mL, Rfl: 5   Meclizine HCl (BONINE PO), Take 50 mg by mouth as needed., Disp: , Rfl:    mirtazapine (REMERON) 45 MG tablet, Take 1 tablet (45 mg total) by mouth at bedtime., Disp: 90 tablet, Rfl: 3   polyethylene glycol (MIRALAX / GLYCOLAX) 17 g packet, Take 17 g by mouth 2 (two) times daily., Disp: 14 each, Rfl: 0   psyllium (METAMUCIL) 58.6 % packet, Take 1 packet by mouth daily as needed., Disp: , Rfl:    simvastatin (ZOCOR) 20 MG tablet, TAKE 1 TABLET BY MOUTH EVERY DAY, Disp: 90 tablet, Rfl: 0  Social History   Tobacco Use  Smoking Status Never  Smokeless Tobacco Never  Tobacco Comments   non smoker     Allergies  Allergen Reactions   Ace Inhibitors     REACTION: cough   Donepezil Diarrhea and Other (See Comments)    GI problems Other reaction(s): Other (See Comments) GI problems   Sulfa Antibiotics Nausea Only    headache   Objective:  There were  no vitals filed for this visit. There is no height or weight on file to calculate BMI. Constitutional Well developed. Well nourished.  Vascular Dorsalis pedis pulses palpable bilaterally. Posterior tibial pulses palpable bilaterally. Capillary refill normal to all digits.  No cyanosis or clubbing noted. Pedal hair growth normal.  Neurologic Normal speech. Oriented to person, place, and time. Protective sensation absent  Dermatologic Wound Location: Right first interspace wound probing down to deep tissue but not bone.  No purulent drainage noted.  No redness noted.  No malodor present. Wound Base: Mixed Granular/Fibrotic Peri-wound: Macerated Exudate: Scant/small amount Serosanguinous exudate Wound Measurements: -See below  Orthopedic: No pain to  palpation either foot.   Radiographs: 3 views of skeletally mature the right foot:No soft tissue emphysema noted.  No osteoarthritis noted.  General lysed osteopenia noted to the whole foot. Assessment:   1. Right foot ulcer, with fat layer exposed ([place])   2. Type II diabetes mellitus with neurological manifestations [facility])      Plan:  Patient was evaluated and treated and all questions answered.  Ulcer right first interspace wound probing noted deep tissue -Debridement as below. -Dressed with Betadine wet-to-dry, DSD. -Continue off-loading with surgical shoe. -I discussed with her we can transition her to erythromycin pad.  The wound has decreased -Erythromycin pads were sent to the pharmacy  Procedure: Excisional Debridement of Wound Tool: Sharp chisel blade/tissue nipper Rationale: Removal of non-viable soft tissue from the wound to promote healing.  Anesthesia: none Pre-Debridement Wound Measurements: 0.3 cm x 0.3 cm x 0.3 cm  Post-Debridement Wound Measurements: 0.4 cm x 0.4 cm x 0.2 cm  Type of Debridement: Sharp Excisional Tissue Removed: Non-viable soft tissue Blood loss: Minimal (<50cc) Depth of Debridement: subcutaneous tissue. Technique: Sharp excisional debridement to bleeding, viable wound base.  Wound Progress: The wound has decreased. Dressing: Dry, sterile, compression dressing. Disposition: Patient tolerated procedure well. Patient to return in 1 week for follow-up.  No follow-ups on file.

## 2021-07-28 ENCOUNTER — Ambulatory Visit (INDEPENDENT_AMBULATORY_CARE_PROVIDER_SITE_OTHER): Payer: Medicare PPO | Admitting: *Deleted

## 2021-07-28 DIAGNOSIS — M25559 Pain in unspecified hip: Secondary | ICD-10-CM

## 2021-07-28 DIAGNOSIS — U071 COVID-19: Secondary | ICD-10-CM

## 2021-07-28 DIAGNOSIS — N1832 Chronic kidney disease, stage 3b: Secondary | ICD-10-CM

## 2021-07-28 DIAGNOSIS — S72009A Fracture of unspecified part of neck of unspecified femur, initial encounter for closed fracture: Secondary | ICD-10-CM

## 2021-07-28 DIAGNOSIS — G3184 Mild cognitive impairment, so stated: Secondary | ICD-10-CM

## 2021-07-28 DIAGNOSIS — I10 Essential (primary) hypertension: Secondary | ICD-10-CM

## 2021-07-31 NOTE — Chronic Care Management (AMB) (Signed)
Chronic Care Management    Clinical Social Work Note  07/31/2021 Name: Tracy Morales MRN: [id number] DOB: 10-13-1919  Tracy Morales is a 88 y.o. year old female who is a primary care patient of Tower, Wynelle Fanny, MD. The CCM team was consulted to assist the patient with chronic disease management and/or care coordination needs related to: [employer]  and Caregiver Stress.   Engaged with patient by telephone for follow up visit in response to provider referral for social work chronic care management and care coordination services.   Consent to Services:  The patient was given information about Chronic Care Management services, agreed to services, and gave verbal consent prior to initiation of services.  Please see initial visit note for detailed documentation.   Patient agreed to services and consent obtained.   Assessment: Review of patient past medical history, allergies, medications, and health status, including review of relevant consultants reports was performed today as part of a comprehensive evaluation and provision of chronic care management and care coordination services.     SDOH (Social Determinants of Health) assessments and interventions performed:    Advanced Directives Status: Not addressed in this encounter.  CCM Care Plan  Allergies  Allergen Reactions   Ace Inhibitors     REACTION: cough   Donepezil Diarrhea and Other (See Comments)    GI problems Other reaction(s): Other (See Comments) GI problems   Sulfa Antibiotics Nausea Only    headache    Outpatient Encounter Medications as of 07/28/2021  Medication Sig   acetaminophen (TYLENOL) 500 MG tablet Take 500 mg by mouth every 6 (six) hours as needed. OTC-UAD   amLODipine (NORVASC) 5 MG tablet TAKE 1 TABLET BY MOUTH EVERY DAY   aspirin 325 MG EC tablet Take 325 mg by mouth daily.   Cholecalciferol (VITAMIN D3) 2000 units capsule Take 2,000 Units by mouth daily.   CRANBERRY PO Take 1 tablet by mouth  daily.   feeding supplement (ENSURE ENLIVE / ENSURE PLUS) LIQD Take 237 mLs by mouth 2 (two) times daily between meals.   ferrous sulfate 325 (65 FE) MG tablet Take 1 tablet (325 mg total) by mouth 2 (two) times daily.   Fluocinolone Acetonide Body 0.01 % OIL Apply 1 application topically daily.   Meclizine HCl (BONINE PO) Take 50 mg by mouth as needed.   mirtazapine (REMERON) 45 MG tablet Take 1 tablet (45 mg total) by mouth at bedtime.   polyethylene glycol (MIRALAX / GLYCOLAX) 17 g packet Take 17 g by mouth 2 (two) times daily.   psyllium (METAMUCIL) 58.6 % packet Take 1 packet by mouth daily as needed.   simvastatin (ZOCOR) 20 MG tablet TAKE 1 TABLET BY MOUTH EVERY DAY   No facility-administered encounter medications on file as of 07/28/2021.    Patient Active Problem List   Diagnosis Date Noted   Dysthymia 07/19/2021   Hematoma 04/19/2021   Urinary retention 04/11/2021   Iron deficiency anemia 04/11/2021   Malnutrition of moderate degree 03/20/2021   ABLA 03/19/2021   DNR (do not resuscitate) 03/18/2021   Closed displaced transverse fracture of shaft of right femur ([place])    Fall    COVID-19 01/19/2021   Pincer nail deformity 07/04/2020   Phlebitis 05/20/2020   Left leg pain 02/19/2020   Ankle sprain 01/15/2020   Seborrheic dermatitis of scalp 08/27/2019   Elevated random blood glucose level 05/11/2019   Pain due to onychomycosis of toenails of both feet 12/04/2018   Recurrent UTI  09/22/2018   Low back pain 09/15/2018   MCI (mild cognitive impairment) 05/11/2018   Frequent urination 02/28/2018   Mobility impaired 05/06/2017   Osteoarthritis of both hips 04/15/2015   Routine general medical examination at a health care facility 04/12/2015   Hair loss 11/19/2014   Loss of weight 06/09/2014   Arthritis of right shoulder region 06/09/2014   Nodule of left lung 06/09/2014   Encounter for Medicare annual wellness exam 10/30/2013   H/O fall 04/27/2013   Risk for falls  04/11/2012   Leg pain, bilateral 01/29/2012   MICROSCOPIC HEMATURIA 08/29/2009   CONDUCTIVE HEARING LOSS BILATERAL 08/08/2009   EDEMA 04/07/2009   BRADYCARDIA 10/26/2008   DYSPNEA 10/26/2008   INCONTINENCE, URGE 02/27/2008   Hyperlipidemia 10/11/2006   PERIPHERAL NEUROPATHY, LOWER EXTREMITIES, BILATERAL 10/11/2006   Essential hypertension 123XX123   DIASTOLIC DYSFUNCTION 123XX123   CAROTID STENOSIS 10/11/2006   CEREBROVASCULAR DISEASE 10/11/2006   PERIPHERAL VASCULAR DISEASE 10/11/2006   ALLERGIC RHINITIS 10/11/2006   CKD stage IIIb ([place]) 10/11/2006   OVERACTIVE BLADDER 10/11/2006   OSTEOARTHRITIS 10/11/2006   CARDIAC MURMUR, HX OF 10/11/2006    Conditions to be addressed/monitored:  s/p hip fx ; Level of care concerns, ADL IADL limitations, Inability to perform ADL's independently, and Lacks knowledge of community resource:    There are no care plans that you recently modified to display for this patient.    Follow Up Plan: Appointment scheduled for SW follow up with client by phone on: 09/01/21      Eduard Clos MSW, [place] Licensed Clinical Social Worker [place]   [phone]

## 2021-07-31 NOTE — Patient Instructions (Signed)
Visit Information  Thank you for taking time to visit with me today. Please don't hesitate to contact me if I can be of assistance to you before our next scheduled telephone appointment.  Our next appointment is by telephone on 09/01/32    Please call the care guide team at [phone] if you need to cancel or reschedule your appointment.   If you are experiencing a Mental Health or [place] or need someone to talk to, please call [phone]   The patient verbalized understanding of instructions, educational materials, and care plan provided today and declined offer to receive copy of patient instructions, educational materials, and care plan.   Eduard Clos MSW, LCSW Licensed Clinical Social Worker [place]   [phone]

## 2021-08-01 DIAGNOSIS — I10 Essential (primary) hypertension: Secondary | ICD-10-CM

## 2021-08-01 DIAGNOSIS — S72009A Fracture of unspecified part of neck of unspecified femur, initial encounter for closed fracture: Secondary | ICD-10-CM

## 2021-08-03 ENCOUNTER — Telehealth: Payer: Self-pay | Admitting: Family Medicine

## 2021-08-03 NOTE — Telephone Encounter (Signed)
Adrienne with [employer] called stating she faxed an order for a wheel chair for pt. Tracy Morales states that she faxed it on 07/28/21 08/01/21 and her last attempt on 08/03/21. Tracy Morales states got orders but no notes(narrative). Please advise. ?

## 2021-08-07 ENCOUNTER — Telehealth: Payer: Self-pay | Admitting: Family Medicine

## 2021-08-07 NOTE — Telephone Encounter (Signed)
Home Health verbal orders ?Caller Name:Gordon ?Agency Name: [facility] ? ?Callback number: [phone] ? ?Requesting OT/PT/Skilled nursing/Social Work/Speech: ? ?Reason:PT ? ?Frequency:1 wk for 9 wks ? ?Please forward to [facility] pool or providers CMA  ?

## 2021-08-07 NOTE — Telephone Encounter (Signed)
Spoke with [facility], they faxed over form that explained in detail what needs to be noted in the physical exam note in order to process this request with insurance. I found order and office note that [place] has been trying to fax but looks like it was been sent to the wrong fax number. I do not see a form with the notes. Dr Glori Bickers do you recall getting this? Office note does not have the information that is needed to process order. ?

## 2021-08-07 NOTE — Telephone Encounter (Signed)
I don't recall.  It sounds like she will need a face to face visit to address the need for wheelchair then, please schedule that.   Sorry for the inconvenience  ?

## 2021-08-07 NOTE — Telephone Encounter (Signed)
Please ok those verbal orders  

## 2021-08-08 NOTE — Telephone Encounter (Signed)
Verbal order given to [place]  ?

## 2021-08-09 ENCOUNTER — Telehealth: Payer: Self-pay | Admitting: Family Medicine

## 2021-08-09 NOTE — Telephone Encounter (Signed)
Please ok those verbal orders Thanks  

## 2021-08-09 NOTE — Telephone Encounter (Signed)
Left VM giving VO ?

## 2021-08-09 NOTE — Telephone Encounter (Signed)
Home Health verbal orders ?Caller Name:Cindy ?Agency Name: [facility] ? ?Callback number: [phone] ? ?Requesting OT/PT/Skilled nursing/Social Work/Speech: ? ?Reason:Nurse needs to revaluate and change foley  catheter and also OT ? ?Frequency:Weekly for 8wks ? ?Please forward to [facility] pool or providers CMA  ?

## 2021-08-09 NOTE — Telephone Encounter (Signed)
Patient's daughter has called in regards to the wheelchair. [facility] [employer] cancelled since the form was not filled out. Does this pt need to make an appointment still with Dr.Tower to discuss the wheelchair and will she need news forms ? ?

## 2021-08-11 NOTE — Telephone Encounter (Signed)
Called and spoke to pt's daughter. Made appt 08-16-21 for F2F for wheelchair. ?

## 2021-08-11 NOTE — Telephone Encounter (Signed)
error 

## 2021-08-11 NOTE — Telephone Encounter (Signed)
If she wants the wheelchair we would have to start over with new forms and a face to face visit  ?

## 2021-08-15 ENCOUNTER — Telehealth: Payer: Self-pay | Admitting: Family Medicine

## 2021-08-15 NOTE — Telephone Encounter (Signed)
Called and lvm to give verbal okay for pt home health. On HHN Tonya.  ?

## 2021-08-15 NOTE — Telephone Encounter (Signed)
Home Health verbal orders ?Caller Name:Tonya ?Agency Name: [facility] ? ?Callback number: [phone] ? ?Requesting OT/PT/Skilled nursing/Social Work/Speech: ? ?Reason:Change Foley Catheter ? ?Frequency:1 month for 2 months ? ?Please forward to [facility] pool or providers CMA  ?

## 2021-08-16 ENCOUNTER — Encounter: Payer: Self-pay | Admitting: Family Medicine

## 2021-08-16 ENCOUNTER — Ambulatory Visit: Payer: Medicare PPO | Admitting: Family Medicine

## 2021-08-16 ENCOUNTER — Other Ambulatory Visit: Payer: Self-pay

## 2021-08-16 VITALS — BP 144/78 | HR 71 | Temp 97.2°F

## 2021-08-16 DIAGNOSIS — R12 Heartburn: Secondary | ICD-10-CM | POA: Insufficient documentation

## 2021-08-16 DIAGNOSIS — Z9181 History of falling: Secondary | ICD-10-CM

## 2021-08-16 DIAGNOSIS — Z7409 Other reduced mobility: Secondary | ICD-10-CM | POA: Diagnosis not present

## 2021-08-16 MED ORDER — FAMOTIDINE 20 MG PO TABS: 20.0000 mg | ORAL_TABLET | Freq: Two times a day (BID) | ORAL | 3 refills | Status: DC

## 2021-08-16 NOTE — Patient Instructions (Signed)
I will work on the wheelchair project and order the chair in again  ? ? ?Try pepcid twice daily (am and pm) on a schedule  ?This should help the cough  ? ?Watch for runny nose also  ?

## 2021-08-16 NOTE — Assessment & Plan Note (Signed)
Especially at night ?Suspect this is causing her cough ?Instructed to take Pepcid 20 mg twice daily on a schedule instead of as needed ?This was sent to the pharmacy ?We will continue to monitor ?

## 2021-08-16 NOTE — Progress Notes (Signed)
? ?Subjective:  ? ? Patient ID: Tracy Morales, female    DOB: [birth year], 88 y.o.   MRN: [id number] ? ?This visit occurred during the SARS-CoV-2 public health emergency.  Safety protocols were in place, including screening questions prior to the visit, additional usage of staff PPE, and extensive cleaning of exam room while observing appropriate contact time as indicated for disinfecting solutions.  ? ?HPI ?Pt presents for face to face visit for medical need for wheelchair  ? ?Wt Readings from Last 3 Encounters:  ?07/18/21 123 lb (55.8 kg)  ?04/11/21 123 lb (55.8 kg)  ?03/18/21 123 lb 7.3 oz (56 kg)  ? ? Unable to weigh today/cannot stand on scale  ? ? ?Trying to get a light weight wheel chair  ? ?Condition /mobility limitation causing need for wheelchair  ? ?Advanced age- turning 33 next month  ?H/o frequent falls ? ?Has worked with PT  ? ?OA of shoulder ?OA of both hips with recent R femur fracture  ? ?Still cannot lift R leg at all  ?Slides it /not lifting it  ?Will not put any weight on it  ?Cannot straighten back with walker  ?The therapists have been unable to get her to transfer (they have to lift and slide)  ?Pt denies pain/just weak (but she tells PT that her knee hurts)  ?Cannot take even one step with the walker  ?They continue to work with her at home  ? ?Even in bed- has to manually lift the R leg to move it (no strength)  ? ?Balance is very poor also  ? ?Sits up in chair for much of the day ? ?At home has: ?4 pronged walker  ?Has an old transfer chair that was given to her / brakes do not work and it is wobbly  ? ? ? ? ?ADLs:  pt cannot do any without the help of a wheelchair and/or another person  ? ?Has a bedside commode : has to have one strong person to transfer her  ? ?Cannot use crutch, cane due to : weakness on both sides and poor balance with falls  ? ? ?Cannot use walker due to  ?Weakness on both sides and poor balance ? ?Who will propel chair : caregiver  ? ?A standard/heavier chair cannot  be used because  ?Caregiver cannot push a heavier chair  ?It cannot fit in bathroom either  ? ?Had to move washer to get into kitchen  ? ?Some heartburn ?Worse when lying down  ?1 pepcid at night occ  ?Patient Active Problem List  ? Diagnosis Date Noted  ? Heartburn 08/16/2021  ? Dysthymia 07/19/2021  ? Hematoma 04/19/2021  ? Urinary retention 04/11/2021  ? Iron deficiency anemia 04/11/2021  ? Malnutrition of moderate degree 03/20/2021  ? ABLA 03/19/2021  ? DNR (do not resuscitate) 03/18/2021  ? Closed displaced transverse fracture of shaft of right femur ([place])   ? Fall   ? COVID-19 01/19/2021  ? Pincer nail deformity 07/04/2020  ? Phlebitis 05/20/2020  ? Left leg pain 02/19/2020  ? Ankle sprain 01/15/2020  ? Seborrheic dermatitis of scalp 08/27/2019  ? Elevated random blood glucose level 05/11/2019  ? Pain due to onychomycosis of toenails of both feet 12/04/2018  ? Recurrent UTI 09/22/2018  ? Low back pain 09/15/2018  ? MCI (mild cognitive impairment) 05/11/2018  ? Frequent urination 02/28/2018  ? Mobility impaired 05/06/2017  ? Osteoarthritis of both hips 04/15/2015  ? Routine general medical examination at a health care facility  04/12/2015  ? Hair loss 11/19/2014  ? Loss of weight 06/09/2014  ? Arthritis of right shoulder region 06/09/2014  ? Nodule of left lung 06/09/2014  ? Encounter for Medicare annual wellness exam 10/30/2013  ? H/O fall 04/27/2013  ? Risk for falls 04/11/2012  ? Leg pain, bilateral 01/29/2012  ? MICROSCOPIC HEMATURIA 08/29/2009  ? CONDUCTIVE HEARING LOSS BILATERAL 08/08/2009  ? EDEMA 04/07/2009  ? BRADYCARDIA 10/26/2008  ? DYSPNEA 10/26/2008  ? INCONTINENCE, URGE 02/27/2008  ? Hyperlipidemia 10/11/2006  ? PERIPHERAL NEUROPATHY, LOWER EXTREMITIES, BILATERAL 10/11/2006  ? Essential hypertension 10/11/2006  ? DIASTOLIC DYSFUNCTION 123XX123  ? CAROTID STENOSIS 10/11/2006  ? CEREBROVASCULAR DISEASE 10/11/2006  ? PERIPHERAL VASCULAR DISEASE 10/11/2006  ? ALLERGIC RHINITIS 10/11/2006  ? CKD stage  IIIb ([place]) 10/11/2006  ? OVERACTIVE BLADDER 10/11/2006  ? OSTEOARTHRITIS 10/11/2006  ? CARDIAC MURMUR, HX OF 10/11/2006  ? ?Past Medical History:  ?Diagnosis Date  ? Allergic rhinitis, cause unspecified   ? Bradycardia   ? Cerebrovascular disease, unspecified   ? Chronic kidney disease, unspecified   ? chronic renal insufficiency  ? Dizziness and giddiness   ? DVT (deep venous thrombosis) ([place])   ? R  ? Dyspnea   ? echo with normal EF. +diastolic dysfunction  ? Failure to thrive in childhood   ? gariatric  ? HTN (hypertension)   ? Hyperlipidemia   ? Hypertonicity of bladder   ? hx  ? Mild depression   ? Mononeuritis of lower limb, unspecified   ? peripheral neuropathy, bilateral  ? Occlusion and stenosis of carotid artery without mention of cerebral infarction 09/03/2007  ? u/s L 40-59%, R 0-39%; stable for many years  ? Osteoarthrosis, unspecified whether generalized or localized, unspecified site   ? Peripheral vascular disease, unspecified ([place])   ? Polymyalgia rheumatica (HCC)   ? hx  ? ?Past Surgical History:  ?Procedure Laterality Date  ? 2D echo  8/06  ? mild aortic stenosis.   ? arterial doppler    ? carotid doppler  4/02  ? bilateral plaque   ? carotid doppler  8/06  ? non significant stenosis  ? carotid doppler    ? R 40%; L 40-60%  ? CATARACT EXTRACTION    ? CHOLECYSTECTOMY  2000  ? COLONOSCOPY  1/04  ? polyps  ? CT SCAN  05/25/08  ? abd and pelvis- inflated urinary bladder and large amt stool throughout w/o blockage   ? dexa  2/01  ? osteopenia  ? echo    ? mild LVH EF 50% As  ? FEMUR IM NAIL Right 03/18/2021  ? Procedure: INTRAMEDULLARY (IM) NAIL FEMORAL;  Surgeon: Corky Mull, MD;  Location: [facility] ORS;  Service: Orthopedics;  Laterality: Right;  ? triger finger contracture  6/04  ? vert. basilar insufficiency  2002  ? zoster  5/02  ? ?Social History  ? ?Tobacco Use  ? Smoking status: Never  ? Smokeless tobacco: Never  ? Tobacco comments:  ?  non smoker   ?Vaping Use  ? Vaping Use: Never used   ?Substance Use Topics  ? Alcohol use: No  ?  Alcohol/week: 0.0 standard drinks  ? Drug use: No  ? ?Family History  ?Problem Relation Age of Onset  ? Hypertension Father   ? Other Father   ?     Cardiac problems  ? Heart disease Father   ? Other Brother   ?     blood clot  ? Other Sister   ?  benign breast nodule  ? ?Allergies  ?Allergen Reactions  ? Ace Inhibitors   ?  REACTION: cough  ? Donepezil Diarrhea and Other (See Comments)  ?  GI problems ?Other reaction(s): Other (See Comments) ?GI problems  ? Sulfa Antibiotics Nausea Only  ?  headache  ? ?Current Outpatient Medications on File Prior to Visit  ?Medication Sig Dispense Refill  ? acetaminophen (TYLENOL) 500 MG tablet Take 500 mg by mouth every 6 (six) hours as needed. OTC-UAD    ? amLODipine (NORVASC) 5 MG tablet TAKE 1 TABLET BY MOUTH EVERY DAY 90 tablet 0  ? aspirin 325 MG EC tablet Take 325 mg by mouth daily.    ? Cholecalciferol (VITAMIN D3) 2000 units capsule Take 2,000 Units by mouth daily.    ? CRANBERRY PO Take 1 tablet by mouth daily.    ? feeding supplement (ENSURE ENLIVE / ENSURE PLUS) LIQD Take 237 mLs by mouth 2 (two) times daily between meals. 237 mL 12  ? ferrous sulfate 325 (65 FE) MG tablet Take 1 tablet (325 mg total) by mouth 2 (two) times daily. 60 tablet 11  ? Meclizine HCl (BONINE PO) Take 50 mg by mouth as needed.    ? mirtazapine (REMERON) 45 MG tablet Take 1 tablet (45 mg total) by mouth at bedtime. 90 tablet 3  ? polyethylene glycol (MIRALAX / GLYCOLAX) 17 g packet Take 17 g by mouth 2 (two) times daily. 14 each 0  ? psyllium (METAMUCIL) 58.6 % packet Take 1 packet by mouth daily as needed.    ? simvastatin (ZOCOR) 20 MG tablet TAKE 1 TABLET BY MOUTH EVERY DAY 90 tablet 0  ? ?No current facility-administered medications on file prior to visit.  ?  ? ?Review of Systems  ?Constitutional:  Negative for activity change, appetite change, fatigue, fever and unexpected weight change.  ?HENT:  Negative for congestion, ear pain,  rhinorrhea, sinus pressure and sore throat.   ?Eyes:  Negative for pain, redness and visual disturbance.  ?Respiratory:  Negative for cough, shortness of breath and wheezing.   ?Cardiovascular:  Negative for chest pai

## 2021-08-16 NOTE — Assessment & Plan Note (Signed)
Patient is dependent on a wheelchair currently and a face-to-face evaluation was done today to obtain a light weight wheelchair for her home ?Due to severe osteoarthritis of hips and a recent right hip fracture and surgery resulting in right leg weakness patient is unable to ambulate without assistance ?She remains a bed and chair bound throughout the day and only gets up with help of family and physical therapy ?Unable to bear any weight on the left leg  ?Is not suitable for walker or cane ?She can self propel a light weight wheelchair for short distances and a caregiver can push it to get her to different rooms in the house to perform her ADLs ?Neither she nor her caregiver could push a heavier wheelchair at this time ?

## 2021-08-17 ENCOUNTER — Encounter: Payer: Self-pay | Admitting: Podiatry

## 2021-08-17 ENCOUNTER — Ambulatory Visit: Payer: Medicare PPO | Admitting: Podiatry

## 2021-08-17 DIAGNOSIS — E1149 Type 2 diabetes mellitus with other diabetic neurological complication: Secondary | ICD-10-CM | POA: Diagnosis not present

## 2021-08-17 DIAGNOSIS — L97512 Non-pressure chronic ulcer of other part of right foot with fat layer exposed: Secondary | ICD-10-CM | POA: Diagnosis not present

## 2021-08-17 NOTE — Progress Notes (Signed)
?Subjective:  ?Patient ID: Tracy Morales, female    DOB: [birth year],  MRN: [id number] ? ?Chief Complaint  ?Patient presents with  ? Wound Check  ? ? ?88 y.o. female presents for wound care.  Patient presents with concern for right first interspace wound.  She states is doing a lot better is feeling a lot better.  She has been doing erythromycin pads as well as Betadine which helped considerably.  She denies any other acute complaints. ? ?Review of Systems: Negative except as noted in the HPI. Denies N/V/F/Ch. ? ?Past Medical History:  ?Diagnosis Date  ? Allergic rhinitis, cause unspecified   ? Bradycardia   ? Cerebrovascular disease, unspecified   ? Chronic kidney disease, unspecified   ? chronic renal insufficiency  ? Dizziness and giddiness   ? DVT (deep venous thrombosis) (HCC)   ? R  ? Dyspnea   ? echo with normal EF. +diastolic dysfunction  ? Failure to thrive in childhood   ? gariatric  ? HTN (hypertension)   ? Hyperlipidemia   ? Hypertonicity of bladder   ? hx  ? Mild depression   ? Mononeuritis of lower limb, unspecified   ? peripheral neuropathy, bilateral  ? Occlusion and stenosis of carotid artery without mention of cerebral infarction 09/03/2007  ? u/s L 40-59%, R 0-39%; stable for many years  ? Osteoarthrosis, unspecified whether generalized or localized, unspecified site   ? Peripheral vascular disease, unspecified (HCC)   ? Polymyalgia rheumatica (HCC)   ? hx  ? ? ?Current Outpatient Medications:  ?  acetaminophen (TYLENOL) 500 MG tablet, Take 500 mg by mouth every 6 (six) hours as needed. OTC-UAD, Disp: , Rfl:  ?  amLODipine (NORVASC) 5 MG tablet, TAKE 1 TABLET BY MOUTH EVERY DAY, Disp: 90 tablet, Rfl: 0 ?  aspirin 325 MG EC tablet, Take 325 mg by mouth daily., Disp: , Rfl:  ?  Cholecalciferol (VITAMIN D3) 2000 units capsule, Take 2,000 Units by mouth daily., Disp: , Rfl:  ?  CRANBERRY PO, Take 1 tablet by mouth daily., Disp: , Rfl:  ?  famotidine (PEPCID) 20 MG tablet, Take 1 tablet (20 mg total)  by mouth 2 (two) times daily., Disp: 180 tablet, Rfl: 3 ?  feeding supplement (ENSURE ENLIVE / ENSURE PLUS) LIQD, Take 237 mLs by mouth 2 (two) times daily between meals., Disp: 237 mL, Rfl: 12 ?  ferrous sulfate 325 (65 FE) MG tablet, Take 1 tablet (325 mg total) by mouth 2 (two) times daily., Disp: 60 tablet, Rfl: 11 ?  Meclizine HCl (BONINE PO), Take 50 mg by mouth as needed., Disp: , Rfl:  ?  mirtazapine (REMERON) 45 MG tablet, Take 1 tablet (45 mg total) by mouth at bedtime., Disp: 90 tablet, Rfl: 3 ?  polyethylene glycol (MIRALAX / GLYCOLAX) 17 g packet, Take 17 g by mouth 2 (two) times daily., Disp: 14 each, Rfl: 0 ?  psyllium (METAMUCIL) 58.6 % packet, Take 1 packet by mouth daily as needed., Disp: , Rfl:  ?  simvastatin (ZOCOR) 20 MG tablet, TAKE 1 TABLET BY MOUTH EVERY DAY, Disp: 90 tablet, Rfl: 0 ? ?Social History  ? ?Tobacco Use  ?Smoking Status Never  ?Smokeless Tobacco Never  ?Tobacco Comments  ? non smoker   ? ? ?Allergies  ?Allergen Reactions  ? Ace Inhibitors   ?  REACTION: cough  ? Donepezil Diarrhea and Other (See Comments)  ?  GI problems ?Other reaction(s): Other (See Comments) ?GI problems  ? Sulfa Antibiotics Nausea  Only  ?  headache  ? ?Objective:  ?There were no vitals filed for this visit. ?There is no height or weight on file to calculate BMI. ?Constitutional Well developed. ?Well nourished.  ?Vascular Dorsalis pedis pulses palpable bilaterally. ?Posterior tibial pulses palpable bilaterally. ?Capillary refill normal to all digits.  ?No cyanosis or clubbing noted. ?Pedal hair growth normal.  ?Neurologic Normal speech. ?Oriented to person, place, and time. ?Protective sensation absent  ?Dermatologic Right first interspace wound no longer probing or no epidermal lysis of skin noted.  No signs of infection noted.  No breakdown of the skin noted.  ?Orthopedic: No pain to palpation either foot.  ? ?Radiographs: None ?Assessment:  ? ?No diagnosis found. ? ? ? ?Plan:  ?Patient was evaluated and  treated and all questions answered. ? ?Ulcer right first interspace wound probing noted deep tissue ?-The wound has completely reepithelialized and closed with erythromycin pads and Betadine wet-to-dry.  I discussed with the patient the importance of keeping it nice and dry between the toes.  I discussed shoe gear modification as well.  If any foot and ankle issues arise in the future of asked her to come see me.  She states understanding. ? ?No follow-ups on file. ? ?  ?

## 2021-08-20 ENCOUNTER — Other Ambulatory Visit: Payer: Self-pay | Admitting: Family Medicine

## 2021-08-21 DIAGNOSIS — N390 Urinary tract infection, site not specified: Secondary | ICD-10-CM | POA: Diagnosis not present

## 2021-08-21 DIAGNOSIS — N39498 Other specified urinary incontinence: Secondary | ICD-10-CM | POA: Diagnosis not present

## 2021-08-21 DIAGNOSIS — R338 Other retention of urine: Secondary | ICD-10-CM | POA: Diagnosis not present

## 2021-08-25 ENCOUNTER — Telehealth: Payer: Self-pay

## 2021-08-25 NOTE — Telephone Encounter (Signed)
Daughter notified of Dr. Tower's comments and verbalized understanding  

## 2021-08-25 NOTE — Telephone Encounter (Signed)
I cannot find an interaction but suspect the pepcid could just reduce the absorption of the macrobid  ?Wait an hour after the macrobid to give the pepcid  if she can  ?Keep [place] posted, hope she feels better soon ?

## 2021-08-25 NOTE — Telephone Encounter (Signed)
Received call from daughter. Patient was started on Nitrofurantion for UTI. Reading information shows that pepcid could interatct with that. Daughter wanted to know if she should hold it for the 14 days she is on abx.  ?

## 2021-09-01 ENCOUNTER — Ambulatory Visit (INDEPENDENT_AMBULATORY_CARE_PROVIDER_SITE_OTHER): Payer: Medicare PPO | Admitting: *Deleted

## 2021-09-01 DIAGNOSIS — S72009A Fracture of unspecified part of neck of unspecified femur, initial encounter for closed fracture: Secondary | ICD-10-CM

## 2021-09-01 DIAGNOSIS — Z7409 Other reduced mobility: Secondary | ICD-10-CM

## 2021-09-01 DIAGNOSIS — G3184 Mild cognitive impairment, so stated: Secondary | ICD-10-CM

## 2021-09-01 NOTE — Progress Notes (Signed)
error 

## 2021-10-03 ENCOUNTER — Ambulatory Visit: Payer: Medicare PPO | Admitting: Podiatry

## 2021-10-03 DIAGNOSIS — L89610 Pressure ulcer of right heel, unstageable: Secondary | ICD-10-CM | POA: Diagnosis not present

## 2021-10-03 NOTE — Progress Notes (Signed)
?Subjective:  ?Patient ID: Tracy Morales, female    DOB: [birth year],  MRN: [id number] ? ?Chief Complaint  ?Patient presents with  ? Foot Ulcer  ? ? ?88 y.o. female presents with the above complaint.  Patient presents with right lateral heel pressure sore.  Patient states this came out of nowhere.  She states she has been experiencing last a month or so when she is having some pain.  She wanted get it evaluated.  She is pretty wheelchair-bound.  She does not recall any pressure to the area however she may be leaning on that side when she sleeping.  There is no open wounds or lesion. ? ? ?Review of Systems: Negative except as noted in the HPI. Denies N/V/F/Ch. ? ?Past Medical History:  ?Diagnosis Date  ? Allergic rhinitis, cause unspecified   ? Bradycardia   ? Cerebrovascular disease, unspecified   ? Chronic kidney disease, unspecified   ? chronic renal insufficiency  ? Dizziness and giddiness   ? DVT (deep venous thrombosis) (HCC)   ? R  ? Dyspnea   ? echo with normal EF. +diastolic dysfunction  ? Failure to thrive in childhood   ? gariatric  ? HTN (hypertension)   ? Hyperlipidemia   ? Hypertonicity of bladder   ? hx  ? Mild depression   ? Mononeuritis of lower limb, unspecified   ? peripheral neuropathy, bilateral  ? Occlusion and stenosis of carotid artery without mention of cerebral infarction 09/03/2007  ? u/s L 40-59%, R 0-39%; stable for many years  ? Osteoarthrosis, unspecified whether generalized or localized, unspecified site   ? Peripheral vascular disease, unspecified (HCC)   ? Polymyalgia rheumatica (HCC)   ? hx  ? ? ?Current Outpatient Medications:  ?  acetaminophen (TYLENOL) 500 MG tablet, Take 500 mg by mouth every 6 (six) hours as needed. OTC-UAD, Disp: , Rfl:  ?  amLODipine (NORVASC) 5 MG tablet, TAKE 1 TABLET BY MOUTH EVERY DAY, Disp: 90 tablet, Rfl: 0 ?  aspirin 325 MG EC tablet, Take 325 mg by mouth daily., Disp: , Rfl:  ?  Cholecalciferol (VITAMIN D3) 2000 units capsule, Take 2,000 Units by  mouth daily., Disp: , Rfl:  ?  CRANBERRY PO, Take 1 tablet by mouth daily., Disp: , Rfl:  ?  famotidine (PEPCID) 20 MG tablet, Take 1 tablet (20 mg total) by mouth 2 (two) times daily., Disp: 180 tablet, Rfl: 3 ?  feeding supplement (ENSURE ENLIVE / ENSURE PLUS) LIQD, Take 237 mLs by mouth 2 (two) times daily between meals., Disp: 237 mL, Rfl: 12 ?  ferrous sulfate 325 (65 FE) MG tablet, Take 1 tablet (325 mg total) by mouth 2 (two) times daily., Disp: 60 tablet, Rfl: 11 ?  Meclizine HCl (BONINE PO), Take 50 mg by mouth as needed., Disp: , Rfl:  ?  mirtazapine (REMERON) 45 MG tablet, Take 1 tablet (45 mg total) by mouth at bedtime., Disp: 90 tablet, Rfl: 3 ?  polyethylene glycol (MIRALAX / GLYCOLAX) 17 g packet, Take 17 g by mouth 2 (two) times daily., Disp: 14 each, Rfl: 0 ?  psyllium (METAMUCIL) 58.6 % packet, Take 1 packet by mouth daily as needed., Disp: , Rfl:  ?  simvastatin (ZOCOR) 20 MG tablet, TAKE 1 TABLET BY MOUTH EVERY DAY, Disp: 90 tablet, Rfl: 0 ? ?Social History  ? ?Tobacco Use  ?Smoking Status Never  ?Smokeless Tobacco Never  ?Tobacco Comments  ? non smoker   ? ? ?Allergies  ?Allergen Reactions  ?  Ace Inhibitors   ?  REACTION: cough  ? Donepezil Diarrhea and Other (See Comments)  ?  GI problems ?Other reaction(s): Other (See Comments) ?GI problems  ? Sulfa Antibiotics Nausea Only  ?  headache  ? ?Objective:  ?There were no vitals filed for this visit. ?There is no height or weight on file to calculate BMI. ?Constitutional Well developed. ?Well nourished.  ?Vascular Dorsalis pedis pulses palpable bilaterally. ?Posterior tibial pulses palpable bilaterally. ?Capillary refill normal to all digits.  ?No cyanosis or clubbing noted. ?Pedal hair growth normal.  ?Neurologic Normal speech. ?Oriented to person, place, and time. ?Epicritic sensation to light touch grossly present bilaterally.  ?Dermatologic Pressure sore with some dark discoloration very superficial in nature noted to the right lateral foot.   Mild pain on palpation.  No breakdown of underlying skin noted.  No eschar noted.  ?Orthopedic: Normal joint ROM without pain or crepitus bilaterally. ?No visible deformities. ?No bony tenderness.  ? ?Radiographs: None ?Assessment:  ? ?1. Pressure injury of right heel, unstageable (HCC)   ? ?Plan:  ?Patient was evaluated and treated and all questions answered. ? ?Right lateral foot unstageable pressure sore ?-All questions and concerns were discussed with the patient in extensive detail ?-At this time the pressure sores very superficial in the very beginning stages.  I discussed with them to take the pressure off followed by offloading the entire leg and the foot with pillows on the back of the calf.  I discussed with the patient the she states understanding. ?-If it continues to clinically get worse have asked her to come back and see me.  She states understanding ? ?No follow-ups on file. ?

## 2021-10-06 ENCOUNTER — Telehealth: Payer: Self-pay | Admitting: Family Medicine

## 2021-10-06 NOTE — Telephone Encounter (Signed)
Please ok those verbal orders  

## 2021-10-06 NOTE — Telephone Encounter (Signed)
Verbal order given to Kate 

## 2021-10-06 NOTE — Telephone Encounter (Signed)
Kreg Shropshire called pt physical therapy @ [phone] . Staing she would like to put in the HOME HEALTH ORDERS  ? ?1- home health physical therapy once a week for 9 weeks ? ?2- Child psychotherapist evaluation ?

## 2021-10-11 DIAGNOSIS — Z9181 History of falling: Secondary | ICD-10-CM | POA: Diagnosis not present

## 2021-10-11 DIAGNOSIS — Z8673 Personal history of transient ischemic attack (TIA), and cerebral infarction without residual deficits: Secondary | ICD-10-CM | POA: Diagnosis not present

## 2021-10-11 DIAGNOSIS — N1832 Chronic kidney disease, stage 3b: Secondary | ICD-10-CM | POA: Diagnosis not present

## 2021-10-11 DIAGNOSIS — D631 Anemia in chronic kidney disease: Secondary | ICD-10-CM | POA: Diagnosis not present

## 2021-10-11 DIAGNOSIS — I1310 Hypertensive heart and chronic kidney disease without heart failure, with stage 1 through stage 4 chronic kidney disease, or unspecified chronic kidney disease: Secondary | ICD-10-CM | POA: Diagnosis not present

## 2021-10-19 ENCOUNTER — Telehealth: Payer: Self-pay | Admitting: Family Medicine

## 2021-10-19 ENCOUNTER — Other Ambulatory Visit: Payer: Self-pay | Admitting: Family Medicine

## 2021-10-19 NOTE — Telephone Encounter (Signed)
Home Health Verbal Orders Caller Name: Gar Gibbon Agency Name: [facility]  Callback number: [phone]  Requesting: Social Work  Reason: To assist the family with Medicaid   Frequency: One more time  Please forward to [facility] pool or providers CMA

## 2021-10-19 NOTE — Telephone Encounter (Signed)
VO given to [employer]

## 2021-10-19 NOTE — Telephone Encounter (Signed)
Please ok those verbal orders  

## 2021-10-20 ENCOUNTER — Ambulatory Visit (INDEPENDENT_AMBULATORY_CARE_PROVIDER_SITE_OTHER): Payer: Medicare PPO | Admitting: *Deleted

## 2021-10-20 DIAGNOSIS — I10 Essential (primary) hypertension: Secondary | ICD-10-CM

## 2021-10-20 DIAGNOSIS — S72009A Fracture of unspecified part of neck of unspecified femur, initial encounter for closed fracture: Secondary | ICD-10-CM

## 2021-10-20 DIAGNOSIS — Z7409 Other reduced mobility: Secondary | ICD-10-CM

## 2021-10-20 DIAGNOSIS — Z9181 History of falling: Secondary | ICD-10-CM

## 2021-10-20 DIAGNOSIS — G3184 Mild cognitive impairment, so stated: Secondary | ICD-10-CM

## 2021-10-20 NOTE — Patient Instructions (Signed)
Visit Information  Thank you for taking time to visit with me today. Please don't hesitate to contact me if I can be of assistance to you before our next scheduled telephone appointment.  Following are the goals we discussed today:  - begin a notebook of services in my neighborhood or community -contact me when/if approved for Medicaid  -continue with Home Health services (PT, RN, SW, etc) - call 211 when I need some help - follow-up on any referrals for help I am given - think ahead to make sure my need does not become an emergency - make a note about what I need to have by the phone or take with me, like an identification card or social security number have a back-up plan - have a back-up plan - make a list of family or friends that I can call   Our next appointment is by telephone on 11/17/21  Please call the care guide team at [phone] if you need to cancel or reschedule your appointment.   If you are experiencing a Mental Health or Behavioral Health Crisis or need someone to talk to, please call 911   The patient verbalized understanding of instructions, educational materials, and care plan provided today and DECLINED offer to receive copy of patient instructions, educational materials, and care plan.   Reece Levy MSW, LCSW Licensed Clinical Social Worker [facility]   [phone]

## 2021-10-20 NOTE — Chronic Care Management (AMB) (Signed)
Chronic Care Management    Clinical Social Work Note  10/20/2021 Name: ALERIA ALEXANIAN MRN: [id number] DOB: 1920-05-02  [name] is a 88 y.o. year old female who is a primary care patient of Tower, Audrie Gallus, MD. The CCM team was consulted to assist the patient with chronic disease management and/or care coordination needs related to: [employer] , Level of Care Concerns, Caregiver Stress, and Financial Difficulties related to limited income .   Engaged with patient by telephone for follow up visit in response to provider referral for social work chronic care management and care coordination services.   Consent to Services:  The patient was given information about Chronic Care Management services, agreed to services, and gave verbal consent prior to initiation of services.  Please see initial visit note for detailed documentation.   Patient agreed to services and consent obtained.   Assessment: Review of patient past medical history, allergies, medications, and health status, including review of relevant consultants reports was performed today as part of a comprehensive evaluation and provision of chronic care management and care coordination services.     SDOH (Social Determinants of Health) assessments and interventions performed:    Advanced Directives Status: Not addressed in this encounter.  CCM Care Plan  Allergies  Allergen Reactions   Ace Inhibitors     REACTION: cough   Donepezil Diarrhea and Other (See Comments)    GI problems Other reaction(s): Other (See Comments) GI problems   Sulfa Antibiotics Nausea Only    headache    Outpatient Encounter Medications as of 10/20/2021  Medication Sig   acetaminophen (TYLENOL) 500 MG tablet Take 500 mg by mouth every 6 (six) hours as needed. OTC-UAD   amLODipine (NORVASC) 5 MG tablet TAKE 1 TABLET BY MOUTH EVERY DAY   aspirin 325 MG EC tablet Take 325 mg by mouth daily.   Cholecalciferol (VITAMIN D3) 2000 units  capsule Take 2,000 Units by mouth daily.   CRANBERRY PO Take 1 tablet by mouth daily.   famotidine (PEPCID) 20 MG tablet Take 1 tablet (20 mg total) by mouth 2 (two) times daily.   feeding supplement (ENSURE ENLIVE / ENSURE PLUS) LIQD Take 237 mLs by mouth 2 (two) times daily between meals.   ferrous sulfate 325 (65 FE) MG tablet Take 1 tablet (325 mg total) by mouth 2 (two) times daily.   Meclizine HCl (BONINE PO) Take 50 mg by mouth as needed.   mirtazapine (REMERON) 45 MG tablet Take 1 tablet (45 mg total) by mouth at bedtime.   polyethylene glycol (MIRALAX / GLYCOLAX) 17 g packet Take 17 g by mouth 2 (two) times daily.   psyllium (METAMUCIL) 58.6 % packet Take 1 packet by mouth daily as needed.   simvastatin (ZOCOR) 20 MG tablet TAKE 1 TABLET BY MOUTH EVERY DAY   No facility-administered encounter medications on file as of 10/20/2021.    Patient Active Problem List   Diagnosis Date Noted   Heartburn 08/16/2021   Dysthymia 07/19/2021   Hematoma 04/19/2021   Urinary retention 04/11/2021   Iron deficiency anemia 04/11/2021   Malnutrition of moderate degree 03/20/2021   ABLA 03/19/2021   DNR (do not resuscitate) 03/18/2021   Closed displaced transverse fracture of shaft of right femur (HCC)    Fall    COVID-19 01/19/2021   Pincer nail deformity 07/04/2020   Phlebitis 05/20/2020   Left leg pain 02/19/2020   Ankle sprain 01/15/2020   Seborrheic dermatitis of scalp 08/27/2019   Elevated  random blood glucose level 05/11/2019   Pain due to onychomycosis of toenails of both feet 12/04/2018   Recurrent UTI 09/22/2018   Low back pain 09/15/2018   MCI (mild cognitive impairment) 05/11/2018   Frequent urination 02/28/2018   Mobility impaired 05/06/2017   Osteoarthritis of both hips 04/15/2015   Routine general medical examination at a health care facility 04/12/2015   Hair loss 11/19/2014   Loss of weight 06/09/2014   Arthritis of right shoulder region 06/09/2014   Nodule of left  lung 06/09/2014   Encounter for Medicare annual wellness exam 10/30/2013   H/O fall 04/27/2013   Risk for falls 04/11/2012   Leg pain, bilateral 01/29/2012   MICROSCOPIC HEMATURIA 08/29/2009   CONDUCTIVE HEARING LOSS BILATERAL 08/08/2009   EDEMA 04/07/2009   BRADYCARDIA 10/26/2008   DYSPNEA 10/26/2008   INCONTINENCE, URGE 02/27/2008   Hyperlipidemia 10/11/2006   PERIPHERAL NEUROPATHY, LOWER EXTREMITIES, BILATERAL 10/11/2006   Essential hypertension 123XX123   DIASTOLIC DYSFUNCTION 123XX123   CAROTID STENOSIS 10/11/2006   CEREBROVASCULAR DISEASE 10/11/2006   PERIPHERAL VASCULAR DISEASE 10/11/2006   ALLERGIC RHINITIS 10/11/2006   CKD stage IIIb ([place]) 10/11/2006   OVERACTIVE BLADDER 10/11/2006   OSTEOARTHRITIS 10/11/2006   CARDIAC MURMUR, HX OF 10/11/2006    Conditions to be addressed/monitored: HTN, Osteoporosis, and Osteoarthritis; Level of care concerns, ADL IADL limitations, and Limited access to caregiver  Care Plan : LCSW Plan of Care  Updates made by Deirdre Peer, LCSW since 10/20/2021 12:00 AM     Problem: Functional Decline      Long-Range Goal: Provide resources, support and guidance for caring for patient in the home   Start Date: 05/22/2021  Expected End Date: 12/31/2021  This Visit's Progress: On track  Recent Progress: On track  Priority: High  Note:   Current barriers:   Patient in need of assistance with connecting to community resources for Level of care concerns, ADL IADL limitations, caregiver strain, Inability to perform ADL's independently, and Lacks knowledge of community resource:   Acknowledges deficits with meeting this unmet need Patient is unable to independently navigate community resource options without care coordination support Clinical Goals:  explore community resource options for unmet needs related to:  Stress, Physical Activity, and None Clinical Interventions:  10/20/21- Pt turned 89 last month! "She can't believe it's..." per  pt's daughter, Enis Slipper. Pt continues to get [facility] therapy through [facility]- PT has her doing minimal walking while family does more wheelchair level moving with her- family was not able to get the new wheelchair approved; were told they could rent it.  Family also reports that the [place] Worker is seeing if pt may be eligible for Medicaid and/or Veteran's benefits. This would provide some options for respite/in-home assistance.   after her ankle injury. She is "scooting from chair to wheelchair" with assistance. They are still waiting on the new wheelchair ordered-  CSW offered to inquire with DME company and called [employer]. Per their report, they have the order but not the PCP paperwork to include "face to face" and "wheelchair narrative". CSW provided fax# for PCP and will alert PCP to expect this to help facilitate the order process.  Daughter also upated-  Suggested daughter check into pt's [employer] benefits to see what extra offerings it may have.  Collaboration with Tower, Wynelle Fanny, MD regarding development and update of comprehensive plan of care as evidenced by provider attestation and co-signature Inter-disciplinary care team collaboration (see longitudinal plan of care) Assessment of needs, barriers ,  agencies contacted, as well as how impacting  Review various resources, discussed options and provided patient information about Level of care concerns, strain and stress to caregiver, Inability to perform ADL's independently, and Lacks knowledge of community resource:   Collaborated with appropriate clinical care team members regarding patient needs Level of care concerns, ADL IADL limitations, and Lacks knowledge of community resource:   , CAD and 88yo with hip fx and immobility Patient interviewed and appropriate assessments performed Referred patient to community resources care guide team for assistance with senior resource support (caregiver support services), private duty care agency  lists Provided patient with information about in-home care needs/support and coverage Discussed plans with patient for ongoing care management follow up and provided patient with direct contact information for care management team Assisted patient/caregiver with obtaining information about health plan benefits Provided education and assistance to client regarding Advanced Directives. Provided education to patient/caregiver regarding level of care options. Other interventions provided: Solution-Focused Strategies employed:   Active listening / Reflection utilized  Problem Solving [place] strategies reviewed Caregiver stress acknowledged  Participation in counseling encouraged  Participation in support group encouraged  Consideration of in-home help encouraged  Increase in actives / exercise encouraged  Patient Goals:  - begin a notebook of services in my neighborhood or community -contact me when/if approved for Medicaid  -continue with Home Health services (PT, RN, SW, etc) - call 211 when I need some help - follow-up on any referrals for help I am given - think ahead to make sure my need does not become an emergency - make a note about what I need to have by the phone or take with me, like an identification card or social security number have a back-up plan - have a back-up plan - make a list of family or friends that I can call  -  Follow Up Plan: Appointment scheduled for SW follow up with client by phone on:  11/17/21      Follow Up Plan: Appointment scheduled for SW follow up with client by phone on: 11/17/21      Eduard Clos MSW, [place] Licensed Clinical Social Worker [place]   [phone]

## 2021-11-01 ENCOUNTER — Encounter: Payer: Self-pay | Admitting: Family Medicine

## 2021-11-01 ENCOUNTER — Telehealth: Payer: Self-pay

## 2021-11-01 ENCOUNTER — Telehealth (INDEPENDENT_AMBULATORY_CARE_PROVIDER_SITE_OTHER): Payer: Medicare PPO | Admitting: Family Medicine

## 2021-11-01 DIAGNOSIS — N390 Urinary tract infection, site not specified: Secondary | ICD-10-CM | POA: Diagnosis not present

## 2021-11-01 DIAGNOSIS — F341 Dysthymic disorder: Secondary | ICD-10-CM | POA: Diagnosis not present

## 2021-11-01 DIAGNOSIS — R54 Age-related physical debility: Secondary | ICD-10-CM | POA: Diagnosis not present

## 2021-11-01 DIAGNOSIS — G3184 Mild cognitive impairment, so stated: Secondary | ICD-10-CM | POA: Diagnosis not present

## 2021-11-01 DIAGNOSIS — R410 Disorientation, unspecified: Secondary | ICD-10-CM | POA: Insufficient documentation

## 2021-11-01 DIAGNOSIS — I10 Essential (primary) hypertension: Secondary | ICD-10-CM

## 2021-11-01 MED ORDER — MIRTAZAPINE 30 MG PO TABS: 30.0000 mg | ORAL_TABLET | Freq: Every day | ORAL | 1 refills | Status: DC

## 2021-11-01 NOTE — Assessment & Plan Note (Signed)
More hallucinations since inc dose of mirtazapine Will plan to go back to 30 mg qhs Sent to pharmacy   Appetite remains poor May also have uti right now

## 2021-11-01 NOTE — Patient Instructions (Signed)
Go down on mirtazapine from 45 to 30 mg daily Start tonight   Continue to encourage fluids the best you can   Come here to get a urine cup to collect and bring a sample back as soon as you can  Then we can start an antibiotic for uti (I strongly suspect a uti)   If symptoms worsen/become severe, go to the ER in the meantime

## 2021-11-01 NOTE — Progress Notes (Signed)
Virtual Visit via Telephone Note  I connected with Tracy Morales on 11/01/21 at  4:00 PM EDT by telephone and verified that I am speaking with the correct person using two identifiers.  Location: Patient: home Provider: office    I discussed the limitations, risks, security and privacy concerns of performing an evaluation and management service by telephone and the availability of in person appointments. I also discussed with the patient that there may be a patient responsible charge related to this service. The patient expressed understanding and agreed to proceed.  Parties involved in encounter  Patient: Tracy Morales Daughter : Tracy Morales   Provider:  Loura Pardon MD   History of Present Illness: 88 yo pt presents with memory and behavior changes , hallucinations and appetite change   Wt Readings from Last 3 Encounters:  07/18/21 123 lb (55.8 kg)  04/11/21 123 lb (55.8 kg)  03/18/21 123 lb 7.3 oz (56 kg)    Had a headache on Monday  Went to sleep and then woke up feeling better She just did not think she was quite right  More nightmares recently , talks to people in the middle of the night   Hallucinations  Asks for her father, asks for a taxi  Tore up her bed- tried to throw things out of her room   She messes with the catheter   Not drinking much Binnie Kand because she is confused  Prior to that she would drink if prompted  Urine is cloudy  No blood that she can tell Strong odor     Nurse was supposed to change catheter   OT noted she was more confused      mirtazapine was inc to 45 mg for dysthymia in February to help with appetite and sleep  Worse since we went up on that dose   Lab Results  Component Value Date   CREATININE 1.35 (H) 07/19/2021   BUN 26 (H) 07/19/2021   NA 135 07/19/2021   K 4.3 07/19/2021   CL 98 07/19/2021   CO2 25 07/19/2021      MCI Aricept in the past   Has cath for urinary retention   Patient Active Problem List    Diagnosis Date Noted   Confusion 11/01/2021   Advanced age 13/31/2023   Heartburn 08/16/2021   Dysthymia 07/19/2021   Hematoma 04/19/2021   Urinary retention 04/11/2021   Iron deficiency anemia 04/11/2021   Malnutrition of moderate degree 03/20/2021   ABLA 03/19/2021   DNR (do not resuscitate) 03/18/2021   Closed displaced transverse fracture of shaft of right femur ([name])    Fall    Pincer nail deformity 07/04/2020   Phlebitis 05/20/2020   Left leg pain 02/19/2020   Ankle sprain 01/15/2020   Seborrheic dermatitis of scalp 08/27/2019   Elevated random blood glucose level 05/11/2019   Pain due to onychomycosis of toenails of both feet 12/04/2018   Recurrent UTI 09/22/2018   Low back pain 09/15/2018   MCI (mild cognitive impairment) 05/11/2018   Frequent urination 02/28/2018   Mobility impaired 05/06/2017   Osteoarthritis of both hips 04/15/2015   Routine general medical examination at a health care facility 04/12/2015   Hair loss 11/19/2014   Loss of weight 06/09/2014   Arthritis of right shoulder region 06/09/2014   Nodule of left lung 06/09/2014   Encounter for Medicare annual wellness exam 10/30/2013   H/O fall 04/27/2013   Risk for falls 04/11/2012   Leg pain, bilateral 01/29/2012  MICROSCOPIC HEMATURIA 08/29/2009   CONDUCTIVE HEARING LOSS BILATERAL 08/08/2009   EDEMA 04/07/2009   BRADYCARDIA 10/26/2008   DYSPNEA 10/26/2008   INCONTINENCE, URGE 02/27/2008   Hyperlipidemia 10/11/2006   PERIPHERAL NEUROPATHY, LOWER EXTREMITIES, BILATERAL 10/11/2006   Essential hypertension 123XX123   DIASTOLIC DYSFUNCTION 123XX123   CAROTID STENOSIS 10/11/2006   CEREBROVASCULAR DISEASE 10/11/2006   PERIPHERAL VASCULAR DISEASE 10/11/2006   ALLERGIC RHINITIS 10/11/2006   CKD stage IIIb ([place]) 10/11/2006   OVERACTIVE BLADDER 10/11/2006   OSTEOARTHRITIS 10/11/2006   CARDIAC MURMUR, HX OF 10/11/2006   Past Medical History:  Diagnosis Date   Allergic rhinitis, cause unspecified     Bradycardia    Cerebrovascular disease, unspecified    Chronic kidney disease, unspecified    chronic renal insufficiency   Dizziness and giddiness    DVT (deep venous thrombosis) (HCC)    R   Dyspnea    echo with normal EF. 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   HTN (hypertension)    Hyperlipidemia    Hypertonicity of bladder    hx   Mild depression    Mononeuritis of lower limb, unspecified    peripheral neuropathy, bilateral   Occlusion and stenosis of carotid artery without mention of cerebral infarction 09/03/2007   u/s L 40-59%, R 0-39%; stable for many years   Osteoarthrosis, unspecified whether generalized or localized, unspecified site    Peripheral vascular disease, unspecified ([place])    Polymyalgia rheumatica ([place])    hx   Past Surgical History:  Procedure Laterality Date   2D echo  8/06   mild aortic stenosis.    arterial doppler     carotid doppler  4/02   bilateral plaque    carotid doppler  8/06   non significant stenosis   carotid doppler     R 40%; L 40-60%   CATARACT EXTRACTION     CHOLECYSTECTOMY  2000   COLONOSCOPY  1/04   polyps   CT SCAN  05/25/08   abd and pelvis- inflated urinary bladder and large amt stool throughout w/o blockage    dexa  2/01   osteopenia   echo     mild LVH EF 50% As   FEMUR IM NAIL Right 03/18/2021   Procedure: INTRAMEDULLARY (IM) NAIL FEMORAL;  Surgeon: Corky Mull, MD;  Location: [facility] ORS;  Service: Orthopedics;  Laterality: Right;   triger finger contracture  6/04   vert. basilar insufficiency  2002   zoster  5/02   Social History   Tobacco Use   Smoking status: Never   Smokeless tobacco: Never   Tobacco comments:    non smoker   Vaping Use   Vaping Use: Never used  Substance Use Topics   Alcohol use: No    Alcohol/week: 0.0 standard drinks   Drug use: No   Family History  Problem Relation Age of Onset   Hypertension Father    Other Father        Cardiac problems    Heart disease Father    Other Brother        blood clot   Other Sister        benign breast nodule   Allergies  Allergen Reactions   Ace Inhibitors     REACTION: cough   Donepezil Diarrhea and Other (See Comments)    GI problems Other reaction(s): Other (See Comments) GI problems   Sulfa Antibiotics Nausea Only    headache   Current  Outpatient Medications on File Prior to Visit  Medication Sig Dispense Refill   acetaminophen (TYLENOL) 500 MG tablet Take 500 mg by mouth every 6 (six) hours as needed. OTC-UAD     amLODipine (NORVASC) 5 MG tablet TAKE 1 TABLET BY MOUTH EVERY DAY 90 tablet 1   aspirin 325 MG EC tablet Take 325 mg by mouth daily.     Cholecalciferol (VITAMIN D3) 2000 units capsule Take 2,000 Units by mouth daily.     CRANBERRY PO Take 1 tablet by mouth daily.     [facility] CRANBERRY 500 MG CAPS Take 1 capsule by mouth 3 (three) times daily.     famotidine (PEPCID) 20 MG tablet Take 1 tablet (20 mg total) by mouth 2 (two) times daily. 180 tablet 3   feeding supplement (ENSURE ENLIVE / ENSURE PLUS) LIQD Take 237 mLs by mouth 2 (two) times daily between meals. 237 mL 12   ferrous sulfate 325 (65 FE) MG tablet Take 1 tablet (325 mg total) by mouth 2 (two) times daily. 60 tablet 11   Meclizine HCl (BONINE PO) Take 50 mg by mouth as needed.     polyethylene glycol (MIRALAX / GLYCOLAX) 17 g packet Take 17 g by mouth 2 (two) times daily. 14 each 0   psyllium (METAMUCIL) 58.6 % packet Take 1 packet by mouth daily as needed.     simvastatin (ZOCOR) 20 MG tablet TAKE 1 TABLET BY MOUTH EVERY DAY 90 tablet 1   vitamin C (ASCORBIC ACID) 500 MG tablet Take 500 mg by mouth 3 (three) times daily.     No current facility-administered medications on file prior to visit.    Review of Systems  Constitutional:  Positive for malaise/fatigue and weight loss. Negative for chills and fever.  HENT:  Negative for congestion, ear pain, sinus pain and sore throat.   Eyes:  Negative for blurred  vision, discharge and redness.  Respiratory:  Negative for cough, shortness of breath and stridor.   Cardiovascular:  Negative for chest pain, palpitations and leg swelling.  Gastrointestinal:  Negative for abdominal pain, diarrhea, nausea and vomiting.  Genitourinary:  Negative for hematuria.       Cloudy urine  Odor  Indwelling foley  Musculoskeletal:  Negative for myalgias.  Skin:  Negative for rash.  Neurological:  Negative for dizziness and headaches.  Psychiatric/Behavioral:  Positive for hallucinations and memory loss. The patient has insomnia.        Confusion  Mental status change  Worse at night    Observations/Objective: History from [employer] Pt heard in back round  On the call/ quiet during visit  Confused affect Not alert  Unable to give her own history Assessment and Plan: Problem List Items Addressed This Visit       Nervous and Auditory   Confusion - Primary    In 88 yo pt with indwelling foley cath  Not eating/drinking  Hallucinating at night   Strongly suspect uti  Family with pick up cups for home collection- will have home nurse get a sample (not optimal from bag but better than nothing)  Will start abx once sample obtained Encourage water best able  Also reduce mirtazapine dose to 30 mg since her dreaming and pm hallucinations worsened with the inc dose in feb        MCI (mild cognitive impairment)    Acute confusion now poss due to uti  Once tx reassess  Intol of aricept Age 45   At home- unsure  if will need to be placed eventually         Genitourinary   Recurrent UTI    Suspect uti now  Confusion/cloudy urine   Encouraging fluids Family to collect cups/then bring ua from her bag (foley) If pos will start uti  Mindful of renal insuff for choice of therapy         Other   Advanced age   Dysthymia    More hallucinations since inc dose of mirtazapine Will plan to go back to 30 mg qhs Sent to pharmacy   Appetite  remains poor May also have uti right now       Relevant Medications   mirtazapine (REMERON) 30 MG tablet     Follow Up Instructions: See AVS Come to get urine collection equip  Enc fluids Go down on mirtazapine to 30 mg at night  If worse-go to ER   I discussed the assessment and treatment plan with the patient. The patient was provided an opportunity to ask questions and all were answered. The patient agreed with the plan and demonstrated an understanding of the instructions.   The patient was advised to call back or seek an in-person evaluation if the symptoms worsen or if the condition fails to improve as anticipated.  I provided 18 minutes of non-face-to-face time during this encounter.   Loura Pardon, MD

## 2021-11-01 NOTE — Telephone Encounter (Signed)
I will see her later today  Agree with Er precautions

## 2021-11-01 NOTE — Telephone Encounter (Signed)
[facility] Night - Client Nonclinical Telephone Record  [facility] Client [facility] Night - Client Client Site [facility] - Night Provider Roxy Manns - MD Contact Type Call Who Is Calling Patient / Member / Family / Caregiver Caller Name Loran Senters Phone Number [phone] Patient Name Tracy Morales Patient DOB 1919/09/20 Call Type Message Only Information Provided Reason for Call Request for General Office Information Initial Comment Caller states her mother is very confused and combative. She believes it may be side effect from Mirtazapine 40mg . Additional Comment Declined triage. Office hours provided. Disp. Time Disposition Final User 11/01/2021 7:31:10 AM General Information Provided Yes 11/03/2021 Call Closed By: Warnell Bureau Transaction Date/Time: 11/01/2021 7:26:45 AM (ET

## 2021-11-01 NOTE — Telephone Encounter (Signed)
Per appt notes pt already has appt with Dr Milinda Antis 11/01/21 at 4 pm with video visit appt. Sending note to Dr Milinda Antis and Sabin CMA.

## 2021-11-01 NOTE — Assessment & Plan Note (Signed)
In 88 yo pt with indwelling foley cath  Not eating/drinking  Hallucinating at night   Strongly suspect uti  Family with pick up cups for home collection- will have home nurse get a sample (not optimal from bag but better than nothing)  Will start abx once sample obtained Encourage water best able  Also reduce mirtazapine dose to 30 mg since her dreaming and pm hallucinations worsened with the inc dose in feb

## 2021-11-01 NOTE — Assessment & Plan Note (Addendum)
Suspect uti now  Confusion/cloudy urine   Encouraging fluids Family to collect cups/then bring ua from her bag (foley) If pos will start uti  Mindful of renal insuff for choice of therapy

## 2021-11-01 NOTE — Assessment & Plan Note (Signed)
Acute confusion now poss due to uti  Once tx reassess  Intol of aricept Age 88   At home- unsure if will need to be placed eventually

## 2021-11-06 ENCOUNTER — Other Ambulatory Visit (INDEPENDENT_AMBULATORY_CARE_PROVIDER_SITE_OTHER): Payer: Medicare PPO

## 2021-11-06 ENCOUNTER — Other Ambulatory Visit: Payer: Self-pay

## 2021-11-06 ENCOUNTER — Other Ambulatory Visit: Payer: Self-pay | Admitting: Family Medicine

## 2021-11-06 DIAGNOSIS — N390 Urinary tract infection, site not specified: Secondary | ICD-10-CM

## 2021-11-06 DIAGNOSIS — R410 Disorientation, unspecified: Secondary | ICD-10-CM

## 2021-11-06 LAB — POC URINALSYSI DIPSTICK (AUTOMATED)
Bilirubin, UA: NEGATIVE
Blood, UA: NEGATIVE
Glucose, UA: NEGATIVE
Ketones, UA: NEGATIVE
Nitrite, UA: NEGATIVE
Protein, UA: NEGATIVE
Spec Grav, UA: 1.01 (ref 1.010–1.025)
Urobilinogen, UA: NEGATIVE E.U./dL — AB
pH, UA: 6 (ref 5.0–8.0)

## 2021-11-06 MED ORDER — CEPHALEXIN 500 MG PO CAPS: 500.0000 mg | ORAL_CAPSULE | Freq: Two times a day (BID) | ORAL | 0 refills | Status: DC

## 2021-11-06 NOTE — Telephone Encounter (Signed)
Called and spoke with patient Daughter, advised her of her results of urine  test , and that antibotic Keflex will be sent to her pharmacy per Dr. Milinda Antis.  Pt's daughter that [facility] is [facility] [place] .

## 2021-11-06 NOTE — Addendum Note (Signed)
Addended by: Winn Jock on: 11/06/2021 11:51 AM   Modules accepted: Orders

## 2021-11-06 NOTE — Telephone Encounter (Signed)
Ua showed only trace leukocytes- please draw up for culture (the order should already be in)   Covering for possible uti with keflex Pended-please send to the preferred pharmacy   Pt has some confusion and dementia so talk to her family  Let her family know that we will contact when culture returns  Encourage fluids if they can

## 2021-11-08 LAB — URINE CULTURE
MICRO NUMBER:: 13483590
SPECIMEN QUALITY:: ADEQUATE

## 2021-11-09 ENCOUNTER — Telehealth: Payer: Self-pay

## 2021-11-09 NOTE — Telephone Encounter (Signed)
Aware, I will talk with her then

## 2021-11-09 NOTE — Telephone Encounter (Signed)
Forward to triage

## 2021-11-09 NOTE — Telephone Encounter (Signed)
I spoke with [name] (DPR signed) early this morning pt was confused and wanted her clothes so that she could go and did not know family members; pt confusion has cleared for now. [name] said for the last few days trying to get in at least 2 bottles of water and some juice into pt each day. Somedays pt does not drink that much. Pt just says she feels bad but cannot explain further. Pt started abx on 11/06/21 and [name] is not sure if any dry mouth or dizziness. [name] said since pt is doing better now does not want to go to ED unless condition worsens again. [name] scheduled video visit with Dr Glori Bickers on 11/10/21 at 12:30 with UC & ED precautions. [name] voiced understanding. Sending note to Dr Glori Bickers and Colver CMA.

## 2021-11-09 NOTE — Telephone Encounter (Addendum)
Patient's daughter is calling in stating that Tracy Morales was seen for hallucinations, and Dr.Tower believed it was related to a UTI and was given medication. Keyani was given medication and was doing better until now. Daughter expressed concern that the symptoms have come back and is asking if we can advise on what to do next. Daughter said that she was so agitated the other night that she started pulling on her cathter.

## 2021-11-10 ENCOUNTER — Telehealth: Payer: Self-pay | Admitting: Student

## 2021-11-10 ENCOUNTER — Telehealth (INDEPENDENT_AMBULATORY_CARE_PROVIDER_SITE_OTHER): Payer: Medicare PPO | Admitting: Family Medicine

## 2021-11-10 ENCOUNTER — Encounter: Payer: Self-pay | Admitting: Family Medicine

## 2021-11-10 VITALS — BP 142/85 | HR 72 | Ht 64.0 in | Wt 123.0 lb

## 2021-11-10 DIAGNOSIS — Z66 Do not resuscitate: Secondary | ICD-10-CM

## 2021-11-10 DIAGNOSIS — Z7409 Other reduced mobility: Secondary | ICD-10-CM

## 2021-11-10 DIAGNOSIS — R54 Age-related physical debility: Secondary | ICD-10-CM

## 2021-11-10 DIAGNOSIS — F03B2 Unspecified dementia, moderate, with psychotic disturbance: Secondary | ICD-10-CM

## 2021-11-10 DIAGNOSIS — E440 Moderate protein-calorie malnutrition: Secondary | ICD-10-CM | POA: Diagnosis not present

## 2021-11-10 MED ORDER — LORAZEPAM 0.5 MG PO TABS: 0.5000 mg | ORAL_TABLET | Freq: Every day | ORAL | 1 refills | Status: DC

## 2021-11-10 NOTE — Progress Notes (Unsigned)
Virtual Visit via Video Note  I connected with Tracy Morales on 11/10/21 at 12:30 PM EDT by a video enabled telemedicine application and verified that I am speaking with the correct person using two identifiers.  Location: Patient: home Provider: office   I discussed the limitations of evaluation and management by telemedicine and the availability of in person appointments. The patient expressed understanding and agreed to proceed.  Parties: Domenic Schwab- pt  [name] Smith-daughter   Video failed today and visit done by phone   History of Present Illness: Pt presents for f/u of confusion   Today is a good day  Thinks the antibiotic did help   Now episodes of delerium/confusion  On /off Is very tired the day after  Always worse at night (not every night)   Family is interested in placement  Has not started looking    On /off Like sundowning  Has tried to pull catheter out  (pulled bag out)  Did get bag back on  Stays up all night  Too weak to get out of bed    Last visit tx for uti with keflex  Has a foley  Can no longer self cath - family cannot do   Sample was contaminated   Difficult to get her to drink fluids  During the day   She coughs occ  Pepcid has helped some   Thought mirtazapine caused worse confusion so dec dose to 30 mg qhs Did not seem to make a difference   Lab Results  Component Value Date   IRON 57 07/19/2021   FERRITIN 856 (H) 03/03/2020   Desires comfort  Not life saving methods   Physical therapy comes out from [facility]   Patient Active Problem List   Diagnosis Date Noted   Confusion 11/01/2021   Advanced age 38/31/2023   Heartburn 08/16/2021   Dysthymia 07/19/2021   Hematoma 04/19/2021   Urinary retention 04/11/2021   Iron deficiency anemia 04/11/2021   Malnutrition of moderate degree 03/20/2021   ABLA 03/19/2021   DNR (do not resuscitate) 03/18/2021   Closed displaced transverse fracture of shaft of right femur  (HCC)    Fall    Pincer nail deformity 07/04/2020   Phlebitis 05/20/2020   Left leg pain 02/19/2020   Ankle sprain 01/15/2020   Seborrheic dermatitis of scalp 08/27/2019   Elevated random blood glucose level 05/11/2019   Pain due to onychomycosis of toenails of both feet 12/04/2018   Recurrent UTI 09/22/2018   Low back pain 09/15/2018   Dementia (HCC) 05/11/2018   Frequent urination 02/28/2018   Mobility impaired 05/06/2017   Osteoarthritis of both hips 04/15/2015   Routine general medical examination at a health care facility 04/12/2015   Hair loss 11/19/2014   Loss of weight 06/09/2014   Arthritis of right shoulder region 06/09/2014   Nodule of left lung 06/09/2014   Encounter for Medicare annual wellness exam 10/30/2013   H/O fall 04/27/2013   Risk for falls 04/11/2012   Leg pain, bilateral 01/29/2012   MICROSCOPIC HEMATURIA 08/29/2009   CONDUCTIVE HEARING LOSS BILATERAL 08/08/2009   EDEMA 04/07/2009   BRADYCARDIA 10/26/2008   DYSPNEA 10/26/2008   INCONTINENCE, URGE 02/27/2008   Hyperlipidemia 10/11/2006   PERIPHERAL NEUROPATHY, LOWER EXTREMITIES, BILATERAL 10/11/2006   Essential hypertension 10/11/2006   DIASTOLIC DYSFUNCTION 10/11/2006   CAROTID STENOSIS 10/11/2006   CEREBROVASCULAR DISEASE 10/11/2006   PERIPHERAL VASCULAR DISEASE 10/11/2006   ALLERGIC RHINITIS 10/11/2006   CKD stage IIIb (HCC) 10/11/2006  OVERACTIVE BLADDER 10/11/2006   OSTEOARTHRITIS 10/11/2006   CARDIAC MURMUR, HX OF 10/11/2006   Past Medical History:  Diagnosis Date   Allergic rhinitis, cause unspecified    Bradycardia    Cerebrovascular disease, unspecified    Chronic kidney disease, unspecified    chronic renal insufficiency   Dizziness and giddiness    DVT (deep venous thrombosis) (HCC)    R   Dyspnea    echo with normal EF. 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   HTN (hypertension)    Hyperlipidemia    Hypertonicity of bladder    hx   Mild  depression    Mononeuritis of lower limb, unspecified    peripheral neuropathy, bilateral   Occlusion and stenosis of carotid artery without mention of cerebral infarction 09/03/2007   u/s L 40-59%, R 0-39%; stable for many years   Osteoarthrosis, unspecified whether generalized or localized, unspecified site    Peripheral vascular disease, unspecified ([place])    Polymyalgia rheumatica ([place])    hx   Past Surgical History:  Procedure Laterality Date   2D echo  8/06   mild aortic stenosis.    arterial doppler     carotid doppler  4/02   bilateral plaque    carotid doppler  8/06   non significant stenosis   carotid doppler     R 40%; L 40-60%   CATARACT EXTRACTION     CHOLECYSTECTOMY  2000   COLONOSCOPY  1/04   polyps   CT SCAN  05/25/08   abd and pelvis- inflated urinary bladder and large amt stool throughout w/o blockage    dexa  2/01   osteopenia   echo     mild LVH EF 50% As   FEMUR IM NAIL Right 03/18/2021   Procedure: INTRAMEDULLARY (IM) NAIL FEMORAL;  Surgeon: Corky Mull, MD;  Location: [facility] ORS;  Service: Orthopedics;  Laterality: Right;   triger finger contracture  6/04   vert. basilar insufficiency  2002   zoster  5/02   Social History   Tobacco Use   Smoking status: Never   Smokeless tobacco: Never   Tobacco comments:    non smoker   Vaping Use   Vaping Use: Never used  Substance Use Topics   Alcohol use: No    Alcohol/week: 0.0 standard drinks of alcohol   Drug use: No   Family History  Problem Relation Age of Onset   Hypertension Father    Other Father        Cardiac problems   Heart disease Father    Other Brother        blood clot   Other Sister        benign breast nodule   Allergies  Allergen Reactions   Ace Inhibitors     REACTION: cough   Donepezil Diarrhea and Other (See Comments)    GI problems Other reaction(s): Other (See Comments) GI problems   Sulfa Antibiotics Nausea Only    headache   Current Outpatient Medications on  File Prior to Visit  Medication Sig Dispense Refill   acetaminophen (TYLENOL) 500 MG tablet Take 500 mg by mouth every 6 (six) hours as needed. OTC-UAD     amLODipine (NORVASC) 5 MG tablet TAKE 1 TABLET BY MOUTH EVERY DAY 90 tablet 1   aspirin 325 MG EC tablet Take 325 mg by mouth daily.     cephALEXin (KEFLEX) 500 MG capsule Take 1 capsule (500 mg  total) by mouth 2 (two) times daily. 14 capsule 0   Cholecalciferol (VITAMIN D3) 2000 units capsule Take 2,000 Units by mouth daily.     CRANBERRY PO Take 1 tablet by mouth daily.     [employer] CRANBERRY 500 MG CAPS Take 1 capsule by mouth 3 (three) times daily.     famotidine (PEPCID) 20 MG tablet Take 1 tablet (20 mg total) by mouth 2 (two) times daily. 180 tablet 3   feeding supplement (ENSURE ENLIVE / ENSURE PLUS) LIQD Take 237 mLs by mouth 2 (two) times daily between meals. 237 mL 12   Meclizine HCl (BONINE PO) Take 50 mg by mouth as needed.     mirtazapine (REMERON) 30 MG tablet Take 1 tablet (30 mg total) by mouth at bedtime. 90 tablet 1   polyethylene glycol (MIRALAX / GLYCOLAX) 17 g packet Take 17 g by mouth 2 (two) times daily. 14 each 0   psyllium (METAMUCIL) 58.6 % packet Take 1 packet by mouth daily as needed.     simvastatin (ZOCOR) 20 MG tablet TAKE 1 TABLET BY MOUTH EVERY DAY 90 tablet 1   vitamin C (ASCORBIC ACID) 500 MG tablet Take 500 mg by mouth 3 (three) times daily.     No current facility-administered medications on file prior to visit.   Review of Systems  Constitutional:  Positive for malaise/fatigue. Negative for chills and fever.       Poor appetite Poor food and fluid intake  HENT:  Negative for congestion, ear pain, sinus pain and sore throat.   Eyes:  Negative for blurred vision, discharge and redness.  Respiratory:  Negative for cough, shortness of breath and stridor.   Cardiovascular:  Negative for chest pain, palpitations and leg swelling.  Gastrointestinal:  Negative for abdominal pain, diarrhea, nausea and vomiting.   Musculoskeletal:  Positive for back pain and joint pain. Negative for falls and myalgias.  Skin:  Negative for rash.  Neurological:  Positive for weakness and headaches. Negative for dizziness.  Psychiatric/Behavioral:  Positive for hallucinations and memory loss. The patient is nervous/anxious and has insomnia.       Observations/Objective: Pt sounds sleepy and mildly confused (up last night)  Cognition/memory is poor  Not acutely agitated No change in voice Daughter gives history     Assessment and Plan:  Problem List Items Addressed This Visit       Nervous and Auditory   Dementia ([place]) - Primary    Progressed from [facility] at advanced age for 89  She is immobile/cared for at home Now some hallucinations/delusions and sundowning with agitation  Family is open to nsg placement  Disc options for comfort and behavior incl benzodiazapine and antipsychotic med along with risks of sedation/falls and mortality Choose to try ativan at bedtime to help sleep and agitation  Will update with response  Palliative care consulted  Will also touch base with MSW re: change in status      Relevant Medications   LORazepam (ATIVAN) 0.5 MG tablet     Other   Advanced age    82 years old Not mobile Now some dementia with sundowning at night  Interested in comfort  Palliative care referral done      Relevant Orders   Amb Referral to Palliative Care   DNR (do not resuscitate)   Relevant Orders   Amb Referral to Palliative Care   Malnutrition of moderate degree    Poor intake food and fluids  Advance age with some dementia  Not desiring feeding tube Palliative care referral done      Relevant Orders   Amb Referral to Palliative Care   Mobility impaired    Getting home care  Advance age with dementia  Palliative care referral done      Relevant Orders   Amb Referral to Palliative Care    Follow Up Instructions: I will reach out to social work to touch base   Try  ativan 0.5 mg at bedtime and tell me if this helps with confusion/agitation and sleep  Encourage fluids Visit some nursing homes locally   I will send a referral to palliative care    I discussed the assessment and treatment plan with the patient. The patient was provided an opportunity to ask questions and all were answered. The patient agreed with the plan and demonstrated an understanding of the instructions.   The patient was advised to call back or seek an in-person evaluation if the symptoms worsen or if the condition fails to improve as anticipated.  I provided 17 minutes of non-face-to-face time during this encounter.   Loura Pardon, MD

## 2021-11-10 NOTE — Patient Instructions (Signed)
I will reach out to social work to touch base   Try ativan 0.5 mg at bedtime and tell me if this helps with confusion/agitation and sleep  Encourage fluids Visit some nursing homes locally   I will send a referral to palliative care

## 2021-11-10 NOTE — Telephone Encounter (Signed)
Spoke with patient's daughter [name], regarding the Palliative referral/services and all questions were answered and she was in agreement with beginning services with [place].  I have scheduled an In-Home Consult for 11/17/21 @ 9:30 AM

## 2021-11-12 NOTE — Assessment & Plan Note (Signed)
88 years old Not mobile Now some dementia with sundowning at night  Interested in comfort  Palliative care referral done

## 2021-11-12 NOTE — Assessment & Plan Note (Signed)
Poor intake food and fluids  Advance age with some dementia  Not desiring feeding tube Palliative care referral done

## 2021-11-12 NOTE — Assessment & Plan Note (Signed)
Getting home care  Advance age with dementia  Palliative care referral done

## 2021-11-12 NOTE — Assessment & Plan Note (Signed)
Progressed from [facility] at advanced age for 89  She is immobile/cared for at home Now some hallucinations/delusions and sundowning with agitation  Family is open to nsg placement  Disc options for comfort and behavior incl benzodiazapine and antipsychotic med along with risks of sedation/falls and mortality Choose to try ativan at bedtime to help sleep and agitation  Will update with response  Palliative care consulted  Will also touch base with MSW re: change in status

## 2021-11-13 ENCOUNTER — Telehealth: Payer: Self-pay

## 2021-11-13 NOTE — Telephone Encounter (Signed)
Patients daughter called and states [facility] is asking if Tracy Morales has been diagnosed with dementia, states [facility] has to hear back from [place] in regards to this before proceeding with HH.

## 2021-11-14 NOTE — Telephone Encounter (Signed)
Yes- diagnosed with dementia

## 2021-11-14 NOTE — Telephone Encounter (Signed)
Patient's daughter notified as instructed by telephone and verbalized understanding. Patient's daughter was advised if HH needs anymore information from Dr. Milinda Antis to call the office.

## 2021-11-17 ENCOUNTER — Other Ambulatory Visit: Payer: Medicare PPO | Admitting: Student

## 2021-11-17 ENCOUNTER — Ambulatory Visit (INDEPENDENT_AMBULATORY_CARE_PROVIDER_SITE_OTHER): Payer: Medicare PPO | Admitting: *Deleted

## 2021-11-17 DIAGNOSIS — K5900 Constipation, unspecified: Secondary | ICD-10-CM

## 2021-11-17 DIAGNOSIS — F03B2 Unspecified dementia, moderate, with psychotic disturbance: Secondary | ICD-10-CM

## 2021-11-17 DIAGNOSIS — R531 Weakness: Secondary | ICD-10-CM

## 2021-11-17 DIAGNOSIS — N1832 Chronic kidney disease, stage 3b: Secondary | ICD-10-CM

## 2021-11-17 DIAGNOSIS — R54 Age-related physical debility: Secondary | ICD-10-CM

## 2021-11-17 DIAGNOSIS — Z515 Encounter for palliative care: Secondary | ICD-10-CM

## 2021-11-17 DIAGNOSIS — Z7409 Other reduced mobility: Secondary | ICD-10-CM

## 2021-11-17 DIAGNOSIS — F03B18 Unspecified dementia, moderate, with other behavioral disturbance: Secondary | ICD-10-CM

## 2021-11-17 DIAGNOSIS — R1310 Dysphagia, unspecified: Secondary | ICD-10-CM

## 2021-11-17 NOTE — Progress Notes (Unsigned)
Therapist, nutritional Palliative Care Consult Note Telephone: [phone]  Fax: [phone]   Date of encounter: 11/17/21 9:55 AM PATIENT NAME: Tracy Morales [address] [place] [address]   [phone] (home)  DOB: 26-Jun-1919 MRN: [id number] PRIMARY CARE PROVIDER:    Judy Pimple, MD,  [address] [place] [address] [phone]  REFERRING PROVIDER:   Judy Pimple, MD [address] [place],  [place] [address] [phone]  RESPONSIBLE PARTY:    Contact Information     Name Relation Home Work Mobile   Smith,[name] Daughter   [phone]   Smith,Clinton Lucila [place]   [phone]        I met face to face with patient and family in the home. Palliative Care was asked to follow this patient by consultation request of  Tower, Audrie Gallus, MD to address advance care planning and complex medical decision making. This is the initial visit.                                     ASSESSMENT AND PLAN / RECOMMENDATIONS:   Advance Care Planning/Goals of Care: Goals include to maximize quality of life and symptom management. Patient/health care surrogate gave his/her permission to discuss.Our advance care planning conversation included a discussion about:    The value and importance of advance care planning  Experiences with loved ones who have been seriously ill or have died  Exploration of personal, cultural or spiritual beliefs that might influence medical decisions  Exploration of goals of care in the event of a sudden injury or illness  Introduced MOST form.  Education provided on Palliative Medicine vs. Hospice services. Will continue therapy services for now. Will monitor for changes and declines, will refer to hospice when she meets eligibility requirements.   CODE STATUS: DNR  Symptom Management/Plan:   Dementia-family to continue providing supportive care. Reorient and redirect as needed. Family would like for patient  to remain in the home for as long as possible. We discussed long range planning as daughter would not be able to care for patient in home if she has further functional decline.   Generalized weakness-secondary to her dementia. Continue therapy as directed. Monitor for falls/safety.  Dysphagia- having more difficulty swallowing. Education provided on aspiration precautions. Recommend soft foods.  GERD- patient with occasional symptoms; continue famotidine Bid. Encourage sitting up at least 30 minutes after eating/drinking.  Constipation- restart miralax 17 gm every other day, continue senna 2 tabs daily.    Follow up Palliative Care Visit: Palliative care will continue to follow for complex medical decision making, advance care planning, and clarification of goals. Return in 4 weeks or prn.   This visit was coded based on medical decision making (MDM).  PPS: 40%  HOSPICE ELIGIBILITY/DIAGNOSIS: TBD  Chief Complaint: Palliative Medicine initial consult.   HISTORY OF PRESENT ILLNESS:  [name] is a 88 y.o. year old female  with dementia, OA of bilateral hips, cerebrovascular disease, OAB, CKD 4, peripheral neuropathy, lung of left lung.    Patient having increased difficulty transferring. Has been non-ambulatory since hip fracture last year. Down to eating 2 meals, breakfast and lunch, although intake still varies. Has been receiving OT/PT with [facility].  Had increased confusion the end of May, hallucinating. Treated for UTI in past month. She is coughing more; started on antacid for reflux. Denies having pain, shortness  of breath, nausea. Endorses constipation. Was able to self cath, now has indwelling catheter, changed out once a month. Patient received sitting in living room. She is able to answer direct questions.   History obtained from review of EMR, discussion with primary team, and interview with family, facility staff/caregiver and/or [name].  I reviewed available labs,  medications, imaging, studies and related documents from the EMR.  Records reviewed and summarized above.    Physical Exam:  Constitutional: NAD General: frail appearing EYES: anicteric sclera, lids intact, no discharge  ENMT: hard of  hearing, oral mucous membranes moist CV: S1S2, RRR, no LE edema Pulmonary: LCTA, no increased work of breathing, no cough, room air Abdomen: normo-active BS + 4 quadrants, soft and non tender, no ascites GU: deferred MSK: +sarcopenia, moves all extremities, non-ambulatory Skin: warm and dry, no rashes or wounds on visible skin Neuro: + generalized weakness Psych: non-anxious affect, A and O x to person, familiars Hem/lymph/immuno: no widespread bruising CURRENT PROBLEM LIST:  Patient Active Problem List   Diagnosis Date Noted   Confusion 11/01/2021   Advanced age 86/31/2023   Heartburn 08/16/2021   Dysthymia 07/19/2021   Hematoma 04/19/2021   Urinary retention 04/11/2021   Iron deficiency anemia 04/11/2021   Malnutrition of moderate degree 03/20/2021   ABLA 03/19/2021   DNR (do not resuscitate) 03/18/2021   Closed displaced transverse fracture of shaft of right femur (HCC)    Fall    Pincer nail deformity 07/04/2020   Phlebitis 05/20/2020   Left leg pain 02/19/2020   Ankle sprain 01/15/2020   Seborrheic dermatitis of scalp 08/27/2019   Elevated random blood glucose level 05/11/2019   Pain due to onychomycosis of toenails of both feet 12/04/2018   Recurrent UTI 09/22/2018   Low back pain 09/15/2018   Dementia (HCC) 05/11/2018   Frequent urination 02/28/2018   Mobility impaired 05/06/2017   Osteoarthritis of both hips 04/15/2015   Routine general medical examination at a health care facility 04/12/2015   Hair loss 11/19/2014   Loss of weight 06/09/2014   Arthritis of right shoulder region 06/09/2014   Nodule of left lung 06/09/2014   Encounter for Medicare annual wellness exam 10/30/2013   H/O fall 04/27/2013   Risk for falls  04/11/2012   Leg pain, bilateral 01/29/2012   MICROSCOPIC HEMATURIA 08/29/2009   CONDUCTIVE HEARING LOSS BILATERAL 08/08/2009   EDEMA 04/07/2009   BRADYCARDIA 10/26/2008   DYSPNEA 10/26/2008   INCONTINENCE, URGE 02/27/2008   Hyperlipidemia 10/11/2006   PERIPHERAL NEUROPATHY, LOWER EXTREMITIES, BILATERAL 10/11/2006   Essential hypertension 10/11/2006   DIASTOLIC DYSFUNCTION 10/11/2006   CAROTID STENOSIS 10/11/2006   CEREBROVASCULAR DISEASE 10/11/2006   PERIPHERAL VASCULAR DISEASE 10/11/2006   ALLERGIC RHINITIS 10/11/2006   CKD stage IIIb (HCC) 10/11/2006   OVERACTIVE BLADDER 10/11/2006   OSTEOARTHRITIS 10/11/2006   CARDIAC MURMUR, HX OF 10/11/2006   PAST MEDICAL HISTORY:  Active Ambulatory Problems    Diagnosis Date Noted   Hyperlipidemia 10/11/2006   PERIPHERAL NEUROPATHY, LOWER EXTREMITIES, BILATERAL 10/11/2006   CONDUCTIVE HEARING LOSS BILATERAL 08/08/2009   Essential hypertension 10/11/2006   BRADYCARDIA 10/26/2008   DIASTOLIC DYSFUNCTION 10/11/2006   CAROTID STENOSIS 10/11/2006   CEREBROVASCULAR DISEASE 10/11/2006   PERIPHERAL VASCULAR DISEASE 10/11/2006   ALLERGIC RHINITIS 10/11/2006   CKD stage IIIb (HCC) 10/11/2006   OVERACTIVE BLADDER 10/11/2006   MICROSCOPIC HEMATURIA 08/29/2009   OSTEOARTHRITIS 10/11/2006   EDEMA 04/07/2009   DYSPNEA 10/26/2008   INCONTINENCE, URGE 02/27/2008   CARDIAC MURMUR, HX OF 10/11/2006  Leg pain, bilateral 01/29/2012   Risk for falls 04/11/2012   H/O fall 04/27/2013   Encounter for Medicare annual wellness exam 10/30/2013   Loss of weight 06/09/2014   Arthritis of right shoulder region 06/09/2014   Nodule of left lung 06/09/2014   Hair loss 11/19/2014   Routine general medical examination at a health care facility 04/12/2015   Osteoarthritis of both hips 04/15/2015   Mobility impaired 05/06/2017   Frequent urination 02/28/2018   Dementia ([place]) 05/11/2018   Low back pain 09/15/2018   Recurrent UTI 09/22/2018   Pain due to  onychomycosis of toenails of both feet 12/04/2018   Elevated random blood glucose level 05/11/2019   Seborrheic dermatitis of scalp 08/27/2019   Ankle sprain 01/15/2020   Left leg pain 02/19/2020   Phlebitis 05/20/2020   Pincer nail deformity 07/04/2020   DNR (do not resuscitate) 03/18/2021   Closed displaced transverse fracture of shaft of right femur ([place])    Fall    ABLA 03/19/2021   Malnutrition of moderate degree 03/20/2021   Urinary retention 04/11/2021   Iron deficiency anemia 04/11/2021   Hematoma 04/19/2021   Dysthymia 07/19/2021   Heartburn 08/16/2021   Confusion 11/01/2021   Advanced age 88/31/2023   Resolved Ambulatory Problems    Diagnosis Date Noted   Hyperpotassemia 01/25/2009   DVT 04/12/2009   POLYMYALGIA RHEUMATICA, HX OF 10/11/2006   WEIGHT LOSS 02/24/2010   CHEST PAIN UNSPECIFIED 06/19/2010   Painful respiration 06/06/2010   Heel pain 03/30/2011   Pain in joint, ankle and foot 04/15/2013   Headache(784.0) 06/23/2013   Head injury, unspecified 06/23/2013   Hip pain 04/14/2015   Right leg pain 04/17/2016   History of DVT (deep vein thrombosis) 06/01/2016   Encounter for therapeutic drug monitoring 06/01/2016   UTI (urinary tract infection) 07/25/2016   Right buttock pain 11/16/2016   Itchy scalp 03/01/2018   Dysuria 09/15/2018   Headache 09/09/2019   Dizziness 09/09/2019   Cellulitis of left leg 01/15/2020   COVID-19 01/19/2021   Hip fracture ([place]) 03/18/2021   Bed sore on buttock 04/11/2021   Past Medical History:  Diagnosis Date   Bradycardia    Chronic kidney disease, unspecified    Dizziness and giddiness    DVT (deep venous thrombosis) (HCC)    Dyspnea    Failure to thrive in childhood    HTN (hypertension)    Mild depression    SOCIAL HX:  Social History   Tobacco Use   Smoking status: Never   Smokeless tobacco: Never   Tobacco comments:    non smoker   Substance Use Topics   Alcohol use: No    Alcohol/week: 0.0 standard  drinks of alcohol   FAMILY HX:  Family History  Problem Relation Age of Onset   Hypertension Father    Other Father        Cardiac problems   Heart disease Father    Other Brother        blood clot   Other Sister        benign breast nodule      ALLERGIES:  Allergies  Allergen Reactions   Ace Inhibitors     REACTION: cough   Donepezil Diarrhea and Other (See Comments)    GI problems Other reaction(s): Other (See Comments) GI problems   Sulfa Antibiotics Nausea Only    headache     PERTINENT MEDICATIONS:  Outpatient Encounter Medications as of 11/17/2021  Medication Sig   acetaminophen (TYLENOL) 500  MG tablet Take 500 mg by mouth every 6 (six) hours as needed. OTC-UAD   amLODipine (NORVASC) 5 MG tablet TAKE 1 TABLET BY MOUTH EVERY DAY   aspirin 325 MG EC tablet Take 325 mg by mouth daily.   cephALEXin (KEFLEX) 500 MG capsule Take 1 capsule (500 mg total) by mouth 2 (two) times daily.   Cholecalciferol (VITAMIN D3) 2000 units capsule Take 2,000 Units by mouth daily.   CRANBERRY PO Take 1 tablet by mouth daily.   [employer] CRANBERRY 500 MG CAPS Take 1 capsule by mouth 3 (three) times daily.   famotidine (PEPCID) 20 MG tablet Take 1 tablet (20 mg total) by mouth 2 (two) times daily.   feeding supplement (ENSURE ENLIVE / ENSURE PLUS) LIQD Take 237 mLs by mouth 2 (two) times daily between meals.   LORazepam (ATIVAN) 0.5 MG tablet Take 1 tablet (0.5 mg total) by mouth at bedtime. Caution of sedation   Meclizine HCl (BONINE PO) Take 50 mg by mouth as needed.   mirtazapine (REMERON) 30 MG tablet Take 1 tablet (30 mg total) by mouth at bedtime.   polyethylene glycol (MIRALAX / GLYCOLAX) 17 g packet Take 17 g by mouth 2 (two) times daily.   psyllium (METAMUCIL) 58.6 % packet Take 1 packet by mouth daily as needed.   simvastatin (ZOCOR) 20 MG tablet TAKE 1 TABLET BY MOUTH EVERY DAY   vitamin C (ASCORBIC ACID) 500 MG tablet Take 500 mg by mouth 3 (three) times daily.   No  facility-administered encounter medications on file as of 11/17/2021.   Thank you for the opportunity to participate in the care of Ms. Devine.  The palliative care team will continue to follow. Please call our office at [phone] if we can be of additional assistance.   Ezekiel Slocumb, NP   COVID-19 PATIENT SCREENING TOOL Asked and negative response unless otherwise noted:  Have you had symptoms of covid, tested positive or been in contact with someone with symptoms/positive test in the past 5-10 days? No

## 2021-11-17 NOTE — Patient Instructions (Signed)
Visit Information  Thank you for taking time to visit with me today. Please don't hesitate to contact me if I can be of assistance to you before our next scheduled telephone appointment.   Our next appointment is by telephone on 12/01/21  Please call the care guide team at [phone] if you need to cancel or reschedule your appointment.   If you are experiencing a Mental Health or Behavioral Health Crisis or need someone to talk to, please call 911   The patient verbalized understanding of instructions, educational materials, and care plan provided today and DECLINED offer to receive copy of patient instructions, educational materials, and care plan.   Reece Levy MSW, LCSW Licensed Clinical Social Worker [facility]   [phone]

## 2021-11-17 NOTE — Chronic Care Management (AMB) (Signed)
Chronic Care Management    Clinical Social Work Note  11/17/2021 Name: Tracy Morales MRN: [id number] DOB: 03/06/1920  Tracy Morales is a 88 y.o. year old female who is a primary care patient of Tower, Marne A, MD. The CCM team was consulted to assist the patient with chronic disease management and/or care coordination needs related to: Community Resources , Level of Care Concerns, and Caregiver Stress.   Engaged with patient by telephone for follow up visit in response to provider referral for social work chronic care management and care coordination services.   Consent to Services:  The patient was given information about Chronic Care Management services, agreed to services, and gave verbal consent prior to initiation of services.  Please see initial visit note for detailed documentation.   Patient agreed to services and consent obtained.   Assessment: Review of patient past medical history, allergies, medications, and health status, including review of relevant consultants reports was performed today as part of a comprehensive evaluation and provision of chronic care management and care coordination services.     SDOH (Social Determinants of Health) assessments and interventions performed:    Advanced Directives Status: Not addressed in this encounter.  CCM Care Plan  Allergies  Allergen Reactions   Ace Inhibitors     REACTION: cough   Donepezil Diarrhea and Other (See Comments)    GI problems Other reaction(s): Other (See Comments) GI problems   Sulfa Antibiotics Nausea Only    headache    Outpatient Encounter Medications as of 11/17/2021  Medication Sig   acetaminophen (TYLENOL) 500 MG tablet Take 500 mg by mouth every 6 (six) hours as needed. OTC-UAD   amLODipine (NORVASC) 5 MG tablet TAKE 1 TABLET BY MOUTH EVERY DAY   aspirin 325 MG EC tablet Take 325 mg by mouth daily.   cephALEXin (KEFLEX) 500 MG capsule Take 1 capsule (500 mg total) by mouth 2 (two) times daily.  (Patient not taking: Reported on 11/17/2021)   Cholecalciferol (VITAMIN D3) 2000 units capsule Take 2,000 Units by mouth daily.   Cholecalciferol (VITAMIN D3) 50 MCG (2000 UT) TABS Take by mouth.   CRANBERRY PO Take 1 tablet by mouth daily. (Patient not taking: Reported on 11/17/2021)   [employer]S CRANBERRY 500 MG CAPS Take 1 capsule by mouth 3 (three) times daily.   famotidine (PEPCID) 20 MG tablet Take 1 tablet (20 mg total) by mouth 2 (two) times daily.   feeding supplement (ENSURE ENLIVE / ENSURE PLUS) LIQD Take 237 mLs by mouth 2 (two) times daily between meals.   LORazepam (ATIVAN) 0.5 MG tablet Take 1 tablet (0.5 mg total) by mouth at bedtime. Caution of sedation   Meclizine HCl (BONINE PO) Take 50 mg by mouth as needed.   mirtazapine (REMERON) 30 MG tablet Take 1 tablet (30 mg total) by mouth at bedtime.   polyethylene glycol (MIRALAX / GLYCOLAX) 17 g packet Take 17 g by mouth 2 (two) times daily.   psyllium (METAMUCIL) 58.6 % packet Take 1 packet by mouth daily as needed.   simvastatin (ZOCOR) 20 MG tablet TAKE 1 TABLET BY MOUTH EVERY DAY   vitamin C (ASCORBIC ACID) 500 MG tablet Take 500 mg by mouth 3 (three) times daily.   No facility-administered encounter medications on file as of 11/17/2021.    Patient Active Problem List   Diagnosis Date Noted   Confusion 11/01/2021   Advanced age 11/01/2021   Heartburn 08/16/2021   Dysthymia 07/19/2021   Hematoma 04/19/2021     Urinary retention 04/11/2021   Iron deficiency anemia 04/11/2021   Malnutrition of moderate degree 03/20/2021   ABLA 03/19/2021   DNR (do not resuscitate) 03/18/2021   Closed displaced transverse fracture of shaft of right femur (HCC)    Fall    Pincer nail deformity 07/04/2020   Phlebitis 05/20/2020   Left leg pain 02/19/2020   Ankle sprain 01/15/2020   Seborrheic dermatitis of scalp 08/27/2019   Elevated random blood glucose level 05/11/2019   Pain due to onychomycosis of toenails of both feet 12/04/2018    Recurrent UTI 09/22/2018   Low back pain 09/15/2018   Dementia (HCC) 05/11/2018   Frequent urination 02/28/2018   Mobility impaired 05/06/2017   Osteoarthritis of both hips 04/15/2015   Routine general medical examination at a health care facility 04/12/2015   Hair loss 11/19/2014   Loss of weight 06/09/2014   Arthritis of right shoulder region 06/09/2014   Nodule of left lung 06/09/2014   Encounter for Medicare annual wellness exam 10/30/2013   H/O fall 04/27/2013   Risk for falls 04/11/2012   Leg pain, bilateral 01/29/2012   MICROSCOPIC HEMATURIA 08/29/2009   CONDUCTIVE HEARING LOSS BILATERAL 08/08/2009   EDEMA 04/07/2009   BRADYCARDIA 10/26/2008   DYSPNEA 10/26/2008   INCONTINENCE, URGE 02/27/2008   Hyperlipidemia 10/11/2006   PERIPHERAL NEUROPATHY, LOWER EXTREMITIES, BILATERAL 10/11/2006   Essential hypertension 10/11/2006   DIASTOLIC DYSFUNCTION 10/11/2006   CAROTID STENOSIS 10/11/2006   CEREBROVASCULAR DISEASE 10/11/2006   PERIPHERAL VASCULAR DISEASE 10/11/2006   ALLERGIC RHINITIS 10/11/2006   CKD stage IIIb (HCC) 10/11/2006   OVERACTIVE BLADDER 10/11/2006   OSTEOARTHRITIS 10/11/2006   CARDIAC MURMUR, HX OF 10/11/2006    Conditions to be addressed/monitored: Dementia; Level of care concerns  Care Plan : LCSW Plan of Care  Updates made by Ethel Veronica P, LCSW since 11/17/2021 12:00 AM     Problem: Functional Decline      Long-Range Goal: Provide resources, support and guidance for caring for patient in the home   Start Date: 05/22/2021  Expected End Date: 12/31/2021  This Visit's Progress: On track  Recent Progress: On track  Priority: High  Note:   Current barriers:   Patient in need of assistance with connecting to community resources for Level of care concerns, ADL IADL limitations, caregiver strain, Inability to perform ADL's independently, and Lacks knowledge of community resource:   Acknowledges deficits with meeting this unmet need Patient is unable  to independently navigate community resource options without care coordination support Clinical Goals:  explore community resource options for unmet needs related to:  Stress, Physical Activity, and None Clinical Interventions:  11/17/21- CSW spoke with [name] today to follow up on possible long term placement. Per daughter, pt had some challenges recently with not sleeping, seeing things/people not there, and was found to have a UTI. "Things are better right now...." Daughter was given a RX for helping her sleep if needed but has not had to give it to her as of yet.  Daughter also shared they met with Palliative Care/[facility] and are getting support from them- uncertain at this time if she is hospice eligible. Daughter does not want to seek placement at this time and will reach out if needs arise prior to follow up call in 2 weeks. 10/20/21- Pt turned 89 last month! "She can't believe it's..." per pt's daughter, [name]. Pt continues to get HH therapy through [facility]- PT has her doing minimal walking while family does more wheelchair level moving with her- family   was not able to get the new wheelchair approved; were told they could rent it.  Family also reports that the [place] Worker is seeing if pt may be eligible for Medicaid and/or Veteran's benefits. This would provide some options for respite/in-home assistance.   after her ankle injury. She is "scooting from chair to wheelchair" with assistance. They are still waiting on the new wheelchair ordered-  CSW offered to inquire with DME company and called [employer]. Per their report, they have the order but not the PCP paperwork to include "face to face" and "wheelchair narrative". CSW provided fax# for PCP and will alert PCP to expect this to help facilitate the order process.  Daughter also upated-  Suggested daughter check into pt's [employer] benefits to see what extra offerings it may have.  Collaboration with Tower, Wynelle Fanny, MD  regarding development and update of comprehensive plan of care as evidenced by provider attestation and co-signature Inter-disciplinary care team collaboration (see longitudinal plan of care) Assessment of needs, barriers , agencies contacted, as well as how impacting  Review various resources, discussed options and provided patient information about Level of care concerns, strain and stress to caregiver, Inability to perform ADL's independently, and Lacks knowledge of community resource:   Collaborated with appropriate clinical care team members regarding patient needs Level of care concerns, ADL IADL limitations, and Lacks knowledge of community resource:   , CAD and 88yo with hip fx and immobility Patient interviewed and appropriate assessments performed Referred patient to community resources care guide team for assistance with senior resource support (caregiver support services), private duty care agency lists Provided patient with information about in-home care needs/support and coverage Discussed plans with patient for ongoing care management follow up and provided patient with direct contact information for care management team Assisted patient/caregiver with obtaining information about health plan benefits Provided education and assistance to client regarding Advanced Directives. Provided education to patient/caregiver regarding level of care options. Other interventions provided: Solution-Focused Strategies employed:   Active listening / Reflection utilized  Problem Solving [place] strategies reviewed Caregiver stress acknowledged  Participation in counseling encouraged  Participation in support group encouraged  Consideration of in-home help encouraged  Increase in actives / exercise encouraged  Patient Goals:  - begin a notebook of services in my neighborhood or community -contact me when/if approved for Medicaid  -continue with Home Health services (PT, RN, SW, etc) - call  211 when I need some help - follow-up on any referrals for help I am given - think ahead to make sure my need does not become an emergency - make a note about what I need to have by the phone or take with me, like an identification card or social security number have a back-up plan - have a back-up plan - make a list of family or friends that I can call  -  Follow Up Plan: Appointment scheduled for SW follow up with client by phone on:  12/01/21      Follow Up Plan: Appointment scheduled for SW follow up with client by phone on: 12/01/21      Eduard Clos MSW, [place] Licensed Clinical Social Worker [place]   [phone]

## 2021-11-23 ENCOUNTER — Ambulatory Visit: Payer: Medicare PPO | Admitting: Podiatry

## 2021-12-01 ENCOUNTER — Ambulatory Visit: Payer: Self-pay | Admitting: *Deleted

## 2021-12-01 ENCOUNTER — Telehealth: Payer: Medicare PPO

## 2021-12-01 DIAGNOSIS — R54 Age-related physical debility: Secondary | ICD-10-CM

## 2021-12-01 DIAGNOSIS — F03B2 Unspecified dementia, moderate, with psychotic disturbance: Secondary | ICD-10-CM

## 2021-12-01 DIAGNOSIS — S72009A Fracture of unspecified part of neck of unspecified femur, initial encounter for closed fracture: Secondary | ICD-10-CM

## 2021-12-01 DIAGNOSIS — Z7409 Other reduced mobility: Secondary | ICD-10-CM

## 2021-12-01 NOTE — Chronic Care Management (AMB) (Signed)
Chronic Care Management    Clinical Social Work Note  12/01/2021 Name: Tracy Morales MRN: [id number] DOB: 25-Apr-1920  Tracy Morales is a 88 y.o. year old female who is a primary care patient of Tower, Wynelle Fanny, MD. The CCM team was consulted to assist the patient with chronic disease management and/or care coordination needs related to: [employer] , Level of Care Concerns, Hospice/Palliative Care Services Education, Caregiver Stress, and Financial Difficulties related to limited income .   Engaged with patient by telephone for follow up visit in response to provider referral for social work chronic care management and care coordination services.   Consent to Services:  The patient was given information about Chronic Care Management services, agreed to services, and gave verbal consent prior to initiation of services.  Please see initial visit note for detailed documentation.   Patient agreed to services and consent obtained.   Assessment: Review of patient past medical history, allergies, medications, and health status, including review of relevant consultants reports was performed today as part of a comprehensive evaluation and provision of chronic care management and care coordination services.     SDOH (Social Determinants of Health) assessments and interventions performed:    Advanced Directives Status: Not addressed in this encounter.  CCM Care Plan  Allergies  Allergen Reactions   Ace Inhibitors     REACTION: cough   Donepezil Diarrhea and Other (See Comments)    GI problems Other reaction(s): Other (See Comments) GI problems   Sulfa Antibiotics Nausea Only    headache    Outpatient Encounter Medications as of 12/01/2021  Medication Sig   acetaminophen (TYLENOL) 500 MG tablet Take 500 mg by mouth every 6 (six) hours as needed. OTC-UAD   amLODipine (NORVASC) 5 MG tablet TAKE 1 TABLET BY MOUTH EVERY DAY   aspirin 325 MG EC tablet Take 325 mg by mouth daily.    cephALEXin (KEFLEX) 500 MG capsule Take 1 capsule (500 mg total) by mouth 2 (two) times daily. (Patient not taking: Reported on 11/17/2021)   Cholecalciferol (VITAMIN D3) 2000 units capsule Take 2,000 Units by mouth daily.   Cholecalciferol (VITAMIN D3) 50 MCG (2000 UT) TABS Take by mouth.   CRANBERRY PO Take 1 tablet by mouth daily. (Patient not taking: Reported on 11/17/2021)   [employer] CRANBERRY 500 MG CAPS Take 1 capsule by mouth 3 (three) times daily.   famotidine (PEPCID) 20 MG tablet Take 1 tablet (20 mg total) by mouth 2 (two) times daily.   feeding supplement (ENSURE ENLIVE / ENSURE PLUS) LIQD Take 237 mLs by mouth 2 (two) times daily between meals.   LORazepam (ATIVAN) 0.5 MG tablet Take 1 tablet (0.5 mg total) by mouth at bedtime. Caution of sedation   Meclizine HCl (BONINE PO) Take 50 mg by mouth as needed.   mirtazapine (REMERON) 30 MG tablet Take 1 tablet (30 mg total) by mouth at bedtime.   polyethylene glycol (MIRALAX / GLYCOLAX) 17 g packet Take 17 g by mouth 2 (two) times daily.   psyllium (METAMUCIL) 58.6 % packet Take 1 packet by mouth daily as needed.   simvastatin (ZOCOR) 20 MG tablet TAKE 1 TABLET BY MOUTH EVERY DAY   vitamin C (ASCORBIC ACID) 500 MG tablet Take 500 mg by mouth 3 (three) times daily.   No facility-administered encounter medications on file as of 12/01/2021.    Patient Active Problem List   Diagnosis Date Noted   Confusion 11/01/2021   Advanced age 43/31/2023   Heartburn  08/16/2021   Dysthymia 07/19/2021   Hematoma 04/19/2021   Urinary retention 04/11/2021   Iron deficiency anemia 04/11/2021   Malnutrition of moderate degree 03/20/2021   ABLA 03/19/2021   DNR (do not resuscitate) 03/18/2021   Closed displaced transverse fracture of shaft of right femur ([place])    Fall    Pincer nail deformity 07/04/2020   Phlebitis 05/20/2020   Left leg pain 02/19/2020   Ankle sprain 01/15/2020   Seborrheic dermatitis of scalp 08/27/2019   Elevated random blood  glucose level 05/11/2019   Pain due to onychomycosis of toenails of both feet 12/04/2018   Recurrent UTI 09/22/2018   Low back pain 09/15/2018   Dementia ([place]) 05/11/2018   Frequent urination 02/28/2018   Mobility impaired 05/06/2017   Osteoarthritis of both hips 04/15/2015   Routine general medical examination at a health care facility 04/12/2015   Hair loss 11/19/2014   Loss of weight 06/09/2014   Arthritis of right shoulder region 06/09/2014   Nodule of left lung 06/09/2014   Encounter for Medicare annual wellness exam 10/30/2013   H/O fall 04/27/2013   Risk for falls 04/11/2012   Leg pain, bilateral 01/29/2012   MICROSCOPIC HEMATURIA 08/29/2009   CONDUCTIVE HEARING LOSS BILATERAL 08/08/2009   EDEMA 04/07/2009   BRADYCARDIA 10/26/2008   DYSPNEA 10/26/2008   INCONTINENCE, URGE 02/27/2008   Hyperlipidemia 10/11/2006   PERIPHERAL NEUROPATHY, LOWER EXTREMITIES, BILATERAL 10/11/2006   Essential hypertension 67/54/4920   DIASTOLIC DYSFUNCTION 03/10/1218   CAROTID STENOSIS 10/11/2006   CEREBROVASCULAR DISEASE 10/11/2006   PERIPHERAL VASCULAR DISEASE 10/11/2006   ALLERGIC RHINITIS 10/11/2006   CKD stage IIIb ([place]) 10/11/2006   OVERACTIVE BLADDER 10/11/2006   OSTEOARTHRITIS 10/11/2006   CARDIAC MURMUR, HX OF 10/11/2006    Conditions to be addressed/monitored: Osteoporosis; Level of care concerns  Care Plan : LCSW Plan of Care  Updates made by Deirdre Peer, LCSW since 12/01/2021 12:00 AM     Problem: Functional Decline      Long-Range Goal: Provide resources, support and guidance for caring for patient in the home Completed 12/01/2021  Start Date: 05/22/2021  Expected End Date: 12/31/2021  This Visit's Progress: On track  Recent Progress: On track  Priority: High  Note:   Current barriers:   Patient in need of assistance with connecting to community resources for Level of care concerns, ADL IADL limitations, caregiver strain, Inability to perform ADL's independently,  and Lacks knowledge of community resource:   Acknowledges deficits with meeting this unmet need Patient is unable to independently navigate community resource options without care coordination support Clinical Goals:  explore community resource options for unmet needs related to:  Stress, Physical Activity, and None Clinical Interventions:  12/01/21- CSW spoke with [name] today who reports [facility]/Palliative Care and [facility] are in the home and offeirng assistance- per daughter, a Medicaid application is underway and the [place] is assisting.  CSW discussed plans to sign off at this time- family well equipped with the  11/17/21- CSW spoke with [name] today to follow up on possible long term placement. Per daughter, pt had some challenges recently with not sleeping, seeing things/people not there, and was found to have a UTI. "Things are better right now...." Daughter was given a RX for helping her sleep if needed but has not had to give it to her as of yet.  Daughter also shared they met with [facility] and are getting support from them- uncertain at this time if she is hospice eligible. Daughter does not want  to seek placement at this time and will reach out if needs arise prior to follow up call in 2 weeks. 10/20/21- Pt turned 89 last month! "She can't believe it's..." per pt's daughter, Enis Slipper. Pt continues to get [facility] therapy through [place]- PT has her doing minimal walking while family does more wheelchair level moving with her- family was not able to get the new wheelchair approved; were told they could rent it.  Family also reports that the [place] Worker is seeing if pt may be eligible for Medicaid and/or Veteran's benefits. This would provide some options for respite/in-home assistance.   after her ankle injury. She is "scooting from chair to wheelchair" with assistance. They are still waiting on the new wheelchair ordered-  CSW offered to inquire with DME  company and called [employer]. Per their report, they have the order but not the PCP paperwork to include "face to face" and "wheelchair narrative". CSW provided fax# for PCP and will alert PCP to expect this to help facilitate the order process.  Daughter also upated-  Suggested daughter check into pt's [employer] benefits to see what extra offerings it may have.  Collaboration with Tower, Wynelle Fanny, MD regarding development and update of comprehensive plan of care as evidenced by provider attestation and co-signature Inter-disciplinary care team collaboration (see longitudinal plan of care) Assessment of needs, barriers , agencies contacted, as well as how impacting  Review various resources, discussed options and provided patient information about Level of care concerns, strain and stress to caregiver, Inability to perform ADL's independently, and Lacks knowledge of community resource:   Collaborated with appropriate clinical care team members regarding patient needs Level of care concerns, ADL IADL limitations, and Lacks knowledge of community resource:   , CAD and 88yo with hip fx and immobility Patient interviewed and appropriate assessments performed Referred patient to community resources care guide team for assistance with senior resource support (caregiver support services), private duty care agency lists Provided patient with information about in-home care needs/support and coverage Discussed plans with patient for ongoing care management follow up and provided patient with direct contact information for care management team Assisted patient/caregiver with obtaining information about health plan benefits Provided education and assistance to client regarding Advanced Directives. Provided education to patient/caregiver regarding level of care options. Other interventions provided: Solution-Focused Strategies employed:   Active listening / Reflection utilized  Problem [place] strategies  reviewed Caregiver stress acknowledged  Participation in counseling encouraged  Participation in support group encouraged  Consideration of in-home help encouraged  Increase in actives / exercise encouraged  Patient Goals:  - begin a notebook of services in my neighborhood or community -contact me when/if approved for Medicaid  -continue with Home Health services (PT, RN, SW, etc) - call 211 when I need some help - follow-up on any referrals for help I am given - think ahead to make sure my need does not become an emergency - make a note about what I need to have by the phone or take with me, like an identification card or social security number have a back-up plan - have a back-up plan - make a list of family or friends that I can call  -  Follow Up Plan: Appointment scheduled for SW follow up with client by phone on:  12/01/21      Follow Up Plan: Client will reach out if further needs arise      Eduard Clos MSW, LCSW Licensed Clinical Social Worker [employer]  [place]   [phone]

## 2021-12-01 NOTE — Patient Instructions (Signed)
Visit Information  Thank you for taking time to visit with me today. Please don't hesitate to contact me if I can be of assistance to you.  If you are experiencing a Mental Health or Behavioral Health Crisis or need someone to talk to, please call 911   The patient verbalized understanding of instructions, educational materials, and care plan provided today and DECLINED offer to receive copy of patient instructions, educational materials, and care plan.   Servando Kyllonen MSW, LCSW Licensed Clinical Social Worker [facility]   [phone]  

## 2021-12-04 ENCOUNTER — Telehealth: Payer: Self-pay

## 2021-12-04 NOTE — Telephone Encounter (Signed)
Home Health verbal orders  Agency Name: [facility]   Requesting Nursing  Reason: for cathter change  Frequency: 1 month 2 // 2 PRN  Please forward to [facility] pool or providers CMA

## 2021-12-04 NOTE — Telephone Encounter (Signed)
Please ok that verbal order  

## 2021-12-06 ENCOUNTER — Telehealth: Payer: Self-pay

## 2021-12-06 NOTE — Telephone Encounter (Signed)
Home Health verbal orders  Agency Name: [facility]  Requesting Social Work  Reason: Daughter has Ajaya's financial paperwork to review and wants to meet to see if she qualify's for medicaid   Please forward to [employer] pool or providers CMA

## 2021-12-06 NOTE — Telephone Encounter (Signed)
Please ok that verbal order  

## 2021-12-06 NOTE — Telephone Encounter (Signed)
Is this okay?

## 2021-12-06 NOTE — Telephone Encounter (Signed)
Called and lvm for given verbal okay for [facility] for pt for nursing for patient for requested service.

## 2021-12-11 DIAGNOSIS — D509 Iron deficiency anemia, unspecified: Secondary | ICD-10-CM | POA: Diagnosis not present

## 2021-12-11 DIAGNOSIS — G3184 Mild cognitive impairment, so stated: Secondary | ICD-10-CM | POA: Diagnosis not present

## 2021-12-11 DIAGNOSIS — N3941 Urge incontinence: Secondary | ICD-10-CM | POA: Diagnosis not present

## 2021-12-11 DIAGNOSIS — E785 Hyperlipidemia, unspecified: Secondary | ICD-10-CM | POA: Diagnosis not present

## 2021-12-11 DIAGNOSIS — F324 Major depressive disorder, single episode, in partial remission: Secondary | ICD-10-CM | POA: Diagnosis not present

## 2021-12-11 DIAGNOSIS — M1990 Unspecified osteoarthritis, unspecified site: Secondary | ICD-10-CM | POA: Diagnosis not present

## 2021-12-11 DIAGNOSIS — F419 Anxiety disorder, unspecified: Secondary | ICD-10-CM | POA: Diagnosis not present

## 2021-12-11 DIAGNOSIS — I739 Peripheral vascular disease, unspecified: Secondary | ICD-10-CM | POA: Diagnosis not present

## 2021-12-11 DIAGNOSIS — I10 Essential (primary) hypertension: Secondary | ICD-10-CM | POA: Diagnosis not present

## 2021-12-13 DIAGNOSIS — Z86718 Personal history of other venous thrombosis and embolism: Secondary | ICD-10-CM

## 2021-12-13 DIAGNOSIS — D631 Anemia in chronic kidney disease: Secondary | ICD-10-CM

## 2021-12-13 DIAGNOSIS — S72321D Displaced transverse fracture of shaft of right femur, subsequent encounter for closed fracture with routine healing: Secondary | ICD-10-CM | POA: Diagnosis not present

## 2021-12-13 DIAGNOSIS — Z9181 History of falling: Secondary | ICD-10-CM

## 2021-12-13 DIAGNOSIS — M353 Polymyalgia rheumatica: Secondary | ICD-10-CM

## 2021-12-13 DIAGNOSIS — G629 Polyneuropathy, unspecified: Secondary | ICD-10-CM

## 2021-12-13 DIAGNOSIS — Z87440 Personal history of urinary (tract) infections: Secondary | ICD-10-CM

## 2021-12-13 DIAGNOSIS — H900 Conductive hearing loss, bilateral: Secondary | ICD-10-CM

## 2021-12-13 DIAGNOSIS — Z466 Encounter for fitting and adjustment of urinary device: Secondary | ICD-10-CM | POA: Diagnosis not present

## 2021-12-13 DIAGNOSIS — I6529 Occlusion and stenosis of unspecified carotid artery: Secondary | ICD-10-CM | POA: Diagnosis not present

## 2021-12-13 DIAGNOSIS — E785 Hyperlipidemia, unspecified: Secondary | ICD-10-CM

## 2021-12-13 DIAGNOSIS — N318 Other neuromuscular dysfunction of bladder: Secondary | ICD-10-CM | POA: Diagnosis not present

## 2021-12-13 DIAGNOSIS — I739 Peripheral vascular disease, unspecified: Secondary | ICD-10-CM

## 2021-12-13 DIAGNOSIS — G3184 Mild cognitive impairment, so stated: Secondary | ICD-10-CM | POA: Diagnosis not present

## 2021-12-13 DIAGNOSIS — D509 Iron deficiency anemia, unspecified: Secondary | ICD-10-CM

## 2021-12-13 DIAGNOSIS — R32 Unspecified urinary incontinence: Secondary | ICD-10-CM

## 2021-12-13 DIAGNOSIS — N1832 Chronic kidney disease, stage 3b: Secondary | ICD-10-CM

## 2021-12-13 DIAGNOSIS — R1310 Dysphagia, unspecified: Secondary | ICD-10-CM

## 2021-12-13 DIAGNOSIS — J309 Allergic rhinitis, unspecified: Secondary | ICD-10-CM

## 2021-12-13 DIAGNOSIS — R339 Retention of urine, unspecified: Secondary | ICD-10-CM | POA: Diagnosis not present

## 2021-12-13 DIAGNOSIS — E440 Moderate protein-calorie malnutrition: Secondary | ICD-10-CM

## 2021-12-13 DIAGNOSIS — M160 Bilateral primary osteoarthritis of hip: Secondary | ICD-10-CM | POA: Diagnosis not present

## 2021-12-13 DIAGNOSIS — M19011 Primary osteoarthritis, right shoulder: Secondary | ICD-10-CM

## 2021-12-13 DIAGNOSIS — F32A Depression, unspecified: Secondary | ICD-10-CM

## 2021-12-13 DIAGNOSIS — Z7982 Long term (current) use of aspirin: Secondary | ICD-10-CM

## 2021-12-13 DIAGNOSIS — Z8616 Personal history of COVID-19: Secondary | ICD-10-CM

## 2021-12-13 DIAGNOSIS — F341 Dysthymic disorder: Secondary | ICD-10-CM | POA: Diagnosis not present

## 2021-12-13 DIAGNOSIS — I1310 Hypertensive heart and chronic kidney disease without heart failure, with stage 1 through stage 4 chronic kidney disease, or unspecified chronic kidney disease: Secondary | ICD-10-CM

## 2021-12-13 DIAGNOSIS — Z8673 Personal history of transient ischemic attack (TIA), and cerebral infarction without residual deficits: Secondary | ICD-10-CM

## 2021-12-13 DIAGNOSIS — N3281 Overactive bladder: Secondary | ICD-10-CM | POA: Diagnosis not present

## 2021-12-13 DIAGNOSIS — K5909 Other constipation: Secondary | ICD-10-CM

## 2021-12-14 ENCOUNTER — Ambulatory Visit: Payer: Medicare PPO | Admitting: Podiatry

## 2021-12-18 ENCOUNTER — Other Ambulatory Visit: Payer: Medicare PPO | Admitting: Student

## 2021-12-18 DIAGNOSIS — F03B18 Unspecified dementia, moderate, with other behavioral disturbance: Secondary | ICD-10-CM

## 2021-12-18 DIAGNOSIS — Z515 Encounter for palliative care: Secondary | ICD-10-CM

## 2021-12-18 DIAGNOSIS — K5900 Constipation, unspecified: Secondary | ICD-10-CM

## 2021-12-18 DIAGNOSIS — R531 Weakness: Secondary | ICD-10-CM

## 2021-12-18 DIAGNOSIS — R630 Anorexia: Secondary | ICD-10-CM

## 2021-12-18 NOTE — Telephone Encounter (Signed)
Jola Schmidt called from [facility] for patient to get an verbal order to help with medicaid and other services. Call back number [phone].

## 2021-12-18 NOTE — Telephone Encounter (Signed)
Verbal order given to [facility] as instructed.

## 2021-12-18 NOTE — Progress Notes (Signed)
[facility] Consult Note Telephone: [phone]  Fax: [phone]    Date of encounter: 12/18/21 1:39 PM PATIENT NAME: Tracy Morales [address] [place] [address]   [phone] (home)  DOB: [birth year] MRN: [id number] PRIMARY CARE PROVIDER:    Abner Greenspan, MD,  [place] [address] [phone]  REFERRING PROVIDER:   Abner Greenspan, MD [address] [place],  [place] [address] [phone]  RESPONSIBLE PARTY:    Contact Information     Name Relation Home Work [place] Daughter   [phone]   [place]   [phone]        I met face to face with patient and family in the home. Palliative Care was asked to follow this patient by consultation request of  Tower, Wynelle Fanny, MD to address advance care planning and complex medical decision making. This is a follow up visit.                                   ASSESSMENT AND PLAN / RECOMMENDATIONS:   Advance Care Planning/Goals of Care: Goals include to maximize quality of life and symptom management. Patient/health care surrogate gave his/her permission to discuss.  CODE STATUS: DNR  Education provided on Palliative Medicine vs. Hospice services.   Symptom Management/Plan:  Dementia-patient requires assistance with adl's. Family to redirect and reorient as needed. She is napping more during the day. Her appetite varies, usually fair, some decline in past couple of days. Monitor for worsening dysphagia. Will monitor for functional and cognitive declines. Will refer for hospice evaluation should patient continue to decline. Daughter expresses she would like for patient to remain in the home for as long as possible.   Decline in appetite-encourage foods patient enjoys. Recommend nutritional supplement BID. Continue mirtazapine QHS. Left mid arm circumference 29.5 cm.  Constipation-encourage adequate fluid  intake. Continue metamucil daily, prunes or prune juice. Continue senna 2 tabs daily.   Generalized weakness-patient has completed therapy. Requires assist x 1 for transfers; out of bed to w/c or chair daily.   Follow up Palliative Care Visit: Palliative care will continue to follow for complex medical decision making, advance care planning, and clarification of goals. Return in 4-6 weeks or prn.   This visit was coded based on medical decision making (MDM).  PPS: 40%  HOSPICE ELIGIBILITY/DIAGNOSIS: TBD  Chief Complaint: Palliative Medicine follow up visit.   HISTORY OF PRESENT ILLNESS:  Tracy Morales is a 88 y.o. year old female  with dementia, OA of bilateral hips, cerebrovascular disease, OAB, CKD 4, peripheral neuropathy, lung of left lung.      Denies pain, occasional shortness of breath with exertion. Appetite is fair; did not eat as well past couple of days. Daughter states she did vomit a small amount this morning; she denies nausea. Sleeping well. Napping more throughout the day. Up daily to w/c. Patient received resting in bed; she states she had just gotten in bed. She is able to answer questions. Urine drainage bag with 100 ml of clear, yellow urine.   History obtained from review of EMR, discussion with primary team, and interview with family, facility staff/caregiver and/or Ms. Moorehouse.  I reviewed available labs, medications, imaging, studies and related documents from the EMR.  Records reviewed and summarized above.   Physical Exam:  Pulse 78, resp 16, b/p  130/76, sats 94% on room air Mid arm circumference 29.5 cm, 16 cm from elbow Constitutional: NAD General: frail appearing, thin EYES: anicteric sclera, lids intact, no discharge  ENMT: intact hearing, oral mucous membranes moist CV: S1S2, RRR, no LE edema Pulmonary: LCTA, no increased work of breathing, no cough, room air Abdomen: normo-active BS + 4 quadrants, soft and non tender, no ascites GU: deferred MSK: +  sarcopenia, moves all extremities, w/c for locomotion Skin: warm and dry, no rashes or wounds on visible skin Neuro: +generalized weakness, A & O x 2 Psych: non-anxious affect, pleasant.  Hem/lymph/immuno: no widespread bruising   Thank you for the opportunity to participate in the care of Ms. Brafford.  The palliative care team will continue to follow. Please call our office at [phone] if we can be of additional assistance.   Ezekiel Slocumb, NP   COVID-19 PATIENT SCREENING TOOL Asked and negative response unless otherwise noted:   Have you had symptoms of covid, tested positive or been in contact with someone with symptoms/positive test in the past 5-10 days? No

## 2022-01-22 ENCOUNTER — Other Ambulatory Visit: Payer: Medicare PPO | Admitting: Student

## 2022-01-22 ENCOUNTER — Telehealth: Payer: Self-pay

## 2022-01-22 ENCOUNTER — Other Ambulatory Visit: Payer: Self-pay

## 2022-01-22 DIAGNOSIS — R8290 Unspecified abnormal findings in urine: Secondary | ICD-10-CM

## 2022-01-22 DIAGNOSIS — Z515 Encounter for palliative care: Secondary | ICD-10-CM

## 2022-01-22 DIAGNOSIS — K219 Gastro-esophageal reflux disease without esophagitis: Secondary | ICD-10-CM

## 2022-01-22 DIAGNOSIS — F03B18 Unspecified dementia, moderate, with other behavioral disturbance: Secondary | ICD-10-CM

## 2022-01-22 MED ORDER — FAMOTIDINE 20 MG PO TABS: 20.0000 mg | ORAL_TABLET | Freq: Two times a day (BID) | ORAL | 3 refills | Status: DC

## 2022-01-22 MED ORDER — OMEPRAZOLE 20 MG PO CPDR: 20.0000 mg | DELAYED_RELEASE_CAPSULE | Freq: Every day | ORAL | 3 refills | Status: DC

## 2022-01-22 NOTE — Telephone Encounter (Signed)
Patients daughter is calling in stating they were prescribed Famotidine 20 MG, and Minvera states it is not helping her that she is still having the reflux.

## 2022-01-22 NOTE — Telephone Encounter (Addendum)
Please advise 

## 2022-01-22 NOTE — Progress Notes (Signed)
[facility] Consult Note Telephone: [phone]  Fax: [phone]    Date of encounter: 01/22/22 1:08 PM PATIENT NAME: Tracy Morales [address] [place] [address]   [phone] (home)  DOB: 1919/10/08 MRN: [id number] PRIMARY CARE PROVIDER:    Abner Greenspan, MD,  [place] [address] [phone]  REFERRING PROVIDER:   Abner Greenspan, MD [address] [place],  [place] [address] [phone]  RESPONSIBLE PARTY:    Contact Information     Name Relation Home Work [place] Daughter   [phone]   [place]   [phone]        I met face to face with patient and family in the home. Palliative Care was asked to follow this patient by consultation request of  Tower, Wynelle Fanny, MD to address advance care planning and complex medical decision making. This is a follow up visit.                                   ASSESSMENT AND PLAN / RECOMMENDATIONS:   Advance Care Planning/Goals of Care: Goals include to maximize quality of life and symptom management. Patient/health care surrogate gave his/her permission to discuss. Our advance care planning conversation included a discussion about:    The value and importance of advance care planning  Experiences with loved ones who have been seriously ill or have died  Exploration of personal, cultural or spiritual beliefs that might influence medical decisions  Exploration of goals of care in the event of a sudden injury or illness  CODE STATUS: DNR  Symptom Management/Plan:  Dementia- patient requires assistance with ADL's. She has days where she is more clear. Reorient and redirect as needed. She requires assist x1 for transfers. Her appetite is fair; varies at times. She is  usually eating two good meals a day and one nutritional supplement. No falls reported. Patient naps intermittently during the day. Will monitor for  further functional and cognitive decline. Patient does have some swallowing difficulty with solid foods. Monitor for worsening dysphasia; Recommends softer foods such as mashed potatoes, oatmeal, cereal.  GERD-patient with worsening reflux symptoms; currently taking famotidine. Daughter states patient does sometimes drink when lying in the bed. Education provided on GERD triggers, encourage patient to sit up at least 30 to 60 minutes after eating or drinking. Raise head of bed when eating or drinking. PCP had been contacted regarding this issue and daughter is awaiting return call.  Cloudy urine- patient with chronic indwelling foley catheter. Urine has been cloudy since yesterday. No other urinary symptoms reported. Encouraged increasing fluids. Notify PCP or palliative should patient start to show symptoms of UTI, fever, chills. Foley catheter changed out monthly per [facility] SN.    Follow up Palliative Care Visit: Palliative care will continue to follow for complex medical decision making, advance care planning, and clarification of goals. Return in 8 weeks or prn.   This visit was coded based on medical decision making (MDM).  PPS: 40%  HOSPICE ELIGIBILITY/DIAGNOSIS: TBD  Chief Complaint: Palliative Medicine follow up visit.   HISTORY OF PRESENT ILLNESS:  Tracy Morales is a 88 y.o. year old female  with dementia, OA of bilateral hips, cerebrovascular disease, OAB, CKD 4, peripheral neuropathy, lung of left lung.     Denies pain; endorses shortness of breath with exertion. Trace edema  to BLE. Appetite varies; she is eating two meals a day.she does have trouble with solids at times. No falls; assist x 1 for transfers. Naps intermittently during the day; sometimes awake at night. Having GERD symptoms; has reached out to PCP as she is on famotidine and not helping. No recent infections, ED visits or hospitalizations.  Daughter expresses needing additional caregiver support in the home; she has been  in touch with SW regarding VA spousal benefits.    History obtained from review of EMR, discussion with primary team, and interview with family, facility staff/caregiver and/or Ms. Salvi.  I reviewed available labs, medications, imaging, studies and related documents from the EMR.  Records reviewed and summarized above.   ROS  Obtained per patient and daughter d/t her dementia. Negative except for pertinent positives and negatives detailed per the HPI.   Physical Exam:  Pulse 72, resp 20, 132/74, sats 96% on room air Constitutional: NAD General: frail appearing, thin  EYES: anicteric sclera, lids intact, no discharge  ENMT: intact hearing, oral mucous membranes moist, dentition intact CV: S1S2, RRR, trace LE edema Pulmonary: LCTA, no increased work of breathing, no cough, room air Abdomen: normo-active BS + 4 quadrants, soft and non tender, no ascites GU: foley catheter; cloudy urine in drainage bag MSK: moves all extremities Skin: warm and dry, no rashes or wounds on visible skin Neuro: generalized weakness, A & O x 2, forgetful Psych: non-anxious affect, pleasant Hem/lymph/immuno: no widespread bruising   Thank you for the opportunity to participate in the care of Ms. Taira.  The palliative care team will continue to follow. Please call our office at [phone] if we can be of additional assistance.   Ezekiel Slocumb, NP   COVID-19 PATIENT SCREENING TOOL Asked and negative response unless otherwise noted:   Have you had symptoms of covid, tested positive or been in contact with someone with symptoms/positive test in the past 5-10 days? No

## 2022-01-22 NOTE — Telephone Encounter (Signed)
Spoke with patient daughter  informed of this.

## 2022-01-22 NOTE — Telephone Encounter (Signed)
I can try omeprazole which may work better. Please let me know what pharmacy What reflux symptoms does she have?    Thanks

## 2022-01-22 NOTE — Telephone Encounter (Signed)
I sent omeprazole Did she mention what symptoms she has?   Hope this will work better than the pepcid

## 2022-01-22 NOTE — Telephone Encounter (Signed)
Please advise 

## 2022-01-22 NOTE — Addendum Note (Signed)
Addended by: Roxy Manns A on: 01/22/2022 04:39 PM   Modules accepted: Orders

## 2022-01-31 ENCOUNTER — Telehealth: Payer: Self-pay | Admitting: Family Medicine

## 2022-01-31 NOTE — Telephone Encounter (Signed)
Cala Bradford from [facility] called over and stated that they are recertifying Sammi for continued monthly catheter changes. She stated that she is doing great. Thank you!

## 2022-01-31 NOTE — Telephone Encounter (Signed)
Great, thanks for the update

## 2022-02-08 DIAGNOSIS — N1832 Chronic kidney disease, stage 3b: Secondary | ICD-10-CM

## 2022-02-08 DIAGNOSIS — I6529 Occlusion and stenosis of unspecified carotid artery: Secondary | ICD-10-CM

## 2022-02-08 DIAGNOSIS — Z9181 History of falling: Secondary | ICD-10-CM

## 2022-02-08 DIAGNOSIS — Z87440 Personal history of urinary (tract) infections: Secondary | ICD-10-CM

## 2022-02-08 DIAGNOSIS — I1310 Hypertensive heart and chronic kidney disease without heart failure, with stage 1 through stage 4 chronic kidney disease, or unspecified chronic kidney disease: Secondary | ICD-10-CM

## 2022-02-08 DIAGNOSIS — I739 Peripheral vascular disease, unspecified: Secondary | ICD-10-CM

## 2022-02-08 DIAGNOSIS — D509 Iron deficiency anemia, unspecified: Secondary | ICD-10-CM

## 2022-02-08 DIAGNOSIS — R1310 Dysphagia, unspecified: Secondary | ICD-10-CM

## 2022-02-08 DIAGNOSIS — Z7982 Long term (current) use of aspirin: Secondary | ICD-10-CM

## 2022-02-08 DIAGNOSIS — S72321D Displaced transverse fracture of shaft of right femur, subsequent encounter for closed fracture with routine healing: Secondary | ICD-10-CM

## 2022-02-08 DIAGNOSIS — R32 Unspecified urinary incontinence: Secondary | ICD-10-CM

## 2022-02-08 DIAGNOSIS — Z8673 Personal history of transient ischemic attack (TIA), and cerebral infarction without residual deficits: Secondary | ICD-10-CM

## 2022-02-08 DIAGNOSIS — M160 Bilateral primary osteoarthritis of hip: Secondary | ICD-10-CM

## 2022-02-08 DIAGNOSIS — Z8616 Personal history of COVID-19: Secondary | ICD-10-CM

## 2022-02-08 DIAGNOSIS — G3184 Mild cognitive impairment, so stated: Secondary | ICD-10-CM

## 2022-02-08 DIAGNOSIS — G629 Polyneuropathy, unspecified: Secondary | ICD-10-CM

## 2022-02-08 DIAGNOSIS — N3281 Overactive bladder: Secondary | ICD-10-CM

## 2022-02-08 DIAGNOSIS — H900 Conductive hearing loss, bilateral: Secondary | ICD-10-CM

## 2022-02-08 DIAGNOSIS — R330 Drug induced retention of urine: Secondary | ICD-10-CM

## 2022-02-08 DIAGNOSIS — F341 Dysthymic disorder: Secondary | ICD-10-CM

## 2022-02-08 DIAGNOSIS — J309 Allergic rhinitis, unspecified: Secondary | ICD-10-CM

## 2022-02-08 DIAGNOSIS — Z86718 Personal history of other venous thrombosis and embolism: Secondary | ICD-10-CM

## 2022-02-08 DIAGNOSIS — D631 Anemia in chronic kidney disease: Secondary | ICD-10-CM

## 2022-02-08 DIAGNOSIS — K5909 Other constipation: Secondary | ICD-10-CM

## 2022-02-08 DIAGNOSIS — Z466 Encounter for fitting and adjustment of urinary device: Secondary | ICD-10-CM

## 2022-02-08 DIAGNOSIS — E785 Hyperlipidemia, unspecified: Secondary | ICD-10-CM

## 2022-02-08 DIAGNOSIS — M353 Polymyalgia rheumatica: Secondary | ICD-10-CM

## 2022-02-08 DIAGNOSIS — E440 Moderate protein-calorie malnutrition: Secondary | ICD-10-CM

## 2022-02-08 DIAGNOSIS — F32A Depression, unspecified: Secondary | ICD-10-CM

## 2022-02-08 DIAGNOSIS — M19011 Primary osteoarthritis, right shoulder: Secondary | ICD-10-CM

## 2022-02-08 DIAGNOSIS — N318 Other neuromuscular dysfunction of bladder: Secondary | ICD-10-CM

## 2022-02-16 ENCOUNTER — Ambulatory Visit: Payer: Medicare PPO

## 2022-02-27 ENCOUNTER — Ambulatory Visit (INDEPENDENT_AMBULATORY_CARE_PROVIDER_SITE_OTHER): Payer: Medicare PPO

## 2022-02-27 DIAGNOSIS — Z23 Encounter for immunization: Secondary | ICD-10-CM | POA: Diagnosis not present

## 2022-03-21 ENCOUNTER — Telehealth: Payer: Self-pay | Admitting: Family Medicine

## 2022-03-21 NOTE — Telephone Encounter (Signed)
Patient daughter called in and stated that she believes patient has an UTI. She stated that she hallucinating and talking to people that is not there. She was wondering if you need a urine sample that palliative care will do it if Dr. Glori Bickers allows them to, but will need an order for it. Please advise. Thank you!

## 2022-03-22 NOTE — Telephone Encounter (Signed)
Called [facility] and had to leave a VM with pt's supervising nurse to see what is the process of getting a UA ordered. Will await call back.  Called daughter and advised her of this and to keep encouraging fluids. ER precautions given

## 2022-03-22 NOTE — Telephone Encounter (Signed)
Please give verbal order for ua and urine culture for palliative care (if possible)  Note ER precautions  Encourage fluids  Thanks for the heads up

## 2022-03-26 ENCOUNTER — Other Ambulatory Visit: Payer: Medicare PPO | Admitting: Student

## 2022-03-28 NOTE — Telephone Encounter (Signed)
Daughter called in today,she said that the [facility] nurse never showed up. However a NP form palliative care hospice will be coming to see her mom tomorrow 10/26 morning to check her. She would like to know if orders can be placed for an urine sample,and blood work,if that's what Dr Glori Bickers would suggest is needed?

## 2022-03-29 ENCOUNTER — Ambulatory Visit: Payer: Medicare PPO | Admitting: Podiatry

## 2022-03-29 ENCOUNTER — Other Ambulatory Visit: Payer: Medicare PPO | Admitting: Student

## 2022-03-29 ENCOUNTER — Encounter: Payer: Self-pay | Admitting: Podiatry

## 2022-03-29 DIAGNOSIS — E1149 Type 2 diabetes mellitus with other diabetic neurological complication: Secondary | ICD-10-CM

## 2022-03-29 DIAGNOSIS — M79675 Pain in left toe(s): Secondary | ICD-10-CM

## 2022-03-29 DIAGNOSIS — M2041 Other hammer toe(s) (acquired), right foot: Secondary | ICD-10-CM

## 2022-03-29 DIAGNOSIS — B351 Tinea unguium: Secondary | ICD-10-CM | POA: Diagnosis not present

## 2022-03-29 DIAGNOSIS — F03B18 Unspecified dementia, moderate, with other behavioral disturbance: Secondary | ICD-10-CM

## 2022-03-29 DIAGNOSIS — M79674 Pain in right toe(s): Secondary | ICD-10-CM | POA: Diagnosis not present

## 2022-03-29 DIAGNOSIS — L608 Other nail disorders: Secondary | ICD-10-CM | POA: Diagnosis not present

## 2022-03-29 DIAGNOSIS — N390 Urinary tract infection, site not specified: Secondary | ICD-10-CM

## 2022-03-29 DIAGNOSIS — N189 Chronic kidney disease, unspecified: Secondary | ICD-10-CM

## 2022-03-29 DIAGNOSIS — L819 Disorder of pigmentation, unspecified: Secondary | ICD-10-CM

## 2022-03-29 DIAGNOSIS — M2011 Hallux valgus (acquired), right foot: Secondary | ICD-10-CM

## 2022-03-29 DIAGNOSIS — Z515 Encounter for palliative care: Secondary | ICD-10-CM

## 2022-03-29 NOTE — Progress Notes (Signed)
This patient returns to my office for at risk foot care.  This patient requires this care by a professional since this patient will be at risk due to having chronic kidney disease and neuropathy.  Patient presents to the office with her daughter.  This patient is unable to cut nails herself since the patient cannot reach her nails.These nails are painful walking and wearing shoes.  This patient presents for at risk foot care today.  General Appearance  Alert, conversant and in no acute stress.  Vascular  Dorsalis pedis and posterior tibial  pulses are weakly  palpable  bilaterally.  Capillary return is within normal limits  bilaterally. Absent digital hair  bilaterally.  Neurologic  Senn-Weinstein monofilament wire test diminished bilaterally. Muscle power within normal limits bilaterally.  Nails Thick disfigured discolored nails with subungual debris  from hallux to fifth toes bilaterally. Pincer nails  B/L.   No evidence of bacterial infection or drainage bilaterally.  Orthopedic  No limitations of motion  feet .  No crepitus or effusions noted.  No bony pathology or digital deformities noted.  HAV  B/L.  Skin  normotropic skin with no porokeratosis noted bilaterally.  No signs of infections or ulcers noted.     Onychomycosis  Pain in right toes  Pain in left toes  Consent was obtained for treatment procedures.   Mechanical debridement of nails 1-5  bilaterally performed with a nail nipper.  Filed with dremel without incident. No infection or ulcer.    Her right great toenail is loosely attached to nail bed.  No infection or drainage noted.   Return office visit    prn                Told patient to return for periodic foot care and evaluation due to potential at risk complications.   Gardiner Barefoot DPM

## 2022-03-29 NOTE — Progress Notes (Signed)
Therapist, nutritional Palliative Care Consult Note Telephone: [phone]  Fax: [phone]    Date of encounter: 03/29/22 11:31 AM PATIENT NAME: Tracy Morales [address] [place] [address]   [phone] (home)  DOB: 1920/04/28 MRN: [id number] PRIMARY CARE PROVIDER:    Judy Pimple, MD,  [address] [place] [address] [phone]  REFERRING PROVIDER:   Judy Pimple, MD [address] [place],  [place] [address] [phone]  RESPONSIBLE PARTY:    Contact Information     Name Relation Home Work Mobile   Tracy Morales,Tracy Morales Daughter   [phone]   Tracy Morales,Tracy Morales   [phone]        I met face to face with patient and family in the home. Palliative Care was asked to follow this patient by consultation request of  Tracy Morales, Tracy Gallus, MD to address advance care planning and complex medical decision making. This is a follow up visit.                                   ASSESSMENT AND PLAN / RECOMMENDATIONS:   Advance Care Planning/Goals of Care: Goals include to maximize quality of life and symptom management. Patient/health care surrogate gave his/her permission to discuss. Our advance care planning conversation included a discussion about:    The value and importance of advance care planning  Experiences with loved ones who have been seriously ill or have died  Exploration of personal, cultural or spiritual beliefs that might influence medical decisions  Exploration of goals of care in the event of a sudden injury or illness  CODE STATUS: DNR  Education provided on Palliative Medicine.   Symptom Management/Plan:  Dementia-having increased confusion, hallucinating. She requires assist x 1 for adl's. She was started on lorazepam QHS PRN per PCP due to increased anxiety/restlessness at night. This has been helpful. Appetite continues to be fair; she is also drinking Ensures. Left mid arm circumference is 29.5 cm, no  change. We discussed disease progression; education provided. Will monitor for further changes/declines.   UTI-patient with increased confusion. She has complained of urgency. She has chronic indwelling foley catheter; changed out monthly per [facility]. Will treat empirically for a UTI. Script for [employer] [facility]. Continue cranberry tablet daily. Encourage adequate water, fluid intake.   Skin discoloration-patient has waffled discoloration to bilateral thighs, correlates with pattern of heating pad. She and daughter is instructed to place a barrier between heating pad and skin; also encouraged to not use for more that 20 minutes at a time.   Follow up Palliative Care Visit: Palliative care will continue to follow for complex medical decision making, advance care planning, and clarification of goals. Return in 6-8 weeks or prn.  This visit was coded based on medical decision making (MDM).  PPS: 40%  HOSPICE ELIGIBILITY/DIAGNOSIS: TBD  Chief Complaint: Palliative Medicine follow up visit.   HISTORY OF PRESENT ILLNESS:  Tracy Morales is a 88 y.o. year old female  with dementia, OA of bilateral hips, cerebrovascular disease, OAB, CKD 4, peripheral neuropathy.    Has increased confusion, agitation. Taking items out of drawer and throwing in floor. Hallucinating; seeing people that are not present. Symptoms have worsened over the past month. Treated for a UTI in June. Patient does endorse urinary urgency; urine occasionally cloudy, otherwise no urinary complaints. Eats a good breakfast, picks over lunch. Drinks an [employer]  for dinner meal. Lorazepam PRN; QHS was started; has been helpful. Also changed to omeprazole. Occasional constipation. Taking metamucil 2 daily, stool softener and prune juice. No falls. Left arm circumference 29.5 cm.   Patient received sitting up to chair in living room. She does answer direct questions. She does not exhibit any anxiety or agitation during visit. She does ask for  NP to assess discoloration on her legs. Patient sits with a heating pad on her lap. Waffled discoloration appears to be from heating pad; she denies pain, tenderness; no open areas or blistering noted.   History obtained from review of EMR, discussion with primary team, and interview with family, facility staff/caregiver and/or Tracy Morales.  I reviewed available labs, medications, imaging, studies and related documents from the EMR.  Records reviewed and summarized above.   ROS  Obtained per patient and daughter d/t her dementia. Negative except for pertinent positives and negatives detailed per the HPI.   Physical Exam: Pulse 76, resp 16, b/p 140/80, sats 97% on room air. Constitutional: NAD General: frail appearing, thin EYES: anicteric sclera, lids intact, no discharge  ENMT: intact hearing, oral mucous membranes moist CV: S1S2, RRR, no LE edema Pulmonary: LCTA, no increased work of breathing, no cough, room air Abdomen: normo-active BS + 4 quadrants, soft and non tender, no ascites GU: deferred MSK: + sarcopenia, moves all extremities Skin: warm and dry, no rashes, waffled discoloration to bilateral thighs Neuro:  + generalized weakness,  + cognitive impairment Psych: non-anxious affect, A and O x to person, familiars Hem/lymph/immuno: no widespread bruising   Thank you for the opportunity to participate in the care of Tracy Morales.  The palliative care team will continue to follow. Please call our office at [phone] if we can be of additional assistance.   Ezekiel Slocumb, NP   COVID-19 PATIENT SCREENING TOOL Asked and negative response unless otherwise noted:   Have you had symptoms of covid, tested positive or been in contact with someone with symptoms/positive test in the past 5-10 days? No

## 2022-04-03 ENCOUNTER — Telehealth: Payer: Self-pay | Admitting: Family Medicine

## 2022-04-03 NOTE — Telephone Encounter (Signed)
VO given to [employer]

## 2022-04-03 NOTE — Telephone Encounter (Signed)
Kim from [facility] called and stated continuing services on monthly basic catheter changing. Call back [phone].

## 2022-04-03 NOTE — Telephone Encounter (Signed)
Verbal order ok if needed

## 2022-04-14 ENCOUNTER — Other Ambulatory Visit: Payer: Self-pay | Admitting: Family Medicine

## 2022-04-23 ENCOUNTER — Telehealth: Payer: Self-pay | Admitting: Family Medicine

## 2022-04-23 DIAGNOSIS — N390 Urinary tract infection, site not specified: Secondary | ICD-10-CM

## 2022-04-23 DIAGNOSIS — R339 Retention of urine, unspecified: Secondary | ICD-10-CM

## 2022-04-23 NOTE — Telephone Encounter (Signed)
Pt's daughter, Maren Reamer called stating the urologist the pt was seeing has now retired Visual merchandiser was wondering could a referral be sent to an office so the pt can continue seeing a urologist? Call back # [phone]

## 2022-04-23 NOTE — Telephone Encounter (Signed)
Patient's daughter notified as instructed by telephone and verbalized understanding. 

## 2022-04-23 NOTE — Telephone Encounter (Signed)
I put the referral in   Let us know if you don't hear from anyone in 1-2 wk

## 2022-04-27 ENCOUNTER — Other Ambulatory Visit: Payer: Self-pay | Admitting: Family Medicine

## 2022-04-30 NOTE — Telephone Encounter (Signed)
Last filled on 11/01/21 #90 tabs with 1 refill, last OV was a virtual for memory on 11/01/21

## 2022-05-03 IMAGING — CT CT HEAD W/O CM
3 series · 16 of 47 positions shown, 19 images · non-contrast
Comparison: CT head 02/14/2019

CLINICAL DATA: Fall, head trauma

EXAM:
CT HEAD WITHOUT CONTRAST
TECHNIQUE: Contiguous axial images were obtained from the base of the skull
through the vertex without intravenous contrast.

[Series 2: head wo · axial · 0.45mm/px · z∈[-125,+5]mm · 10 of 32 slices shown, 13 images]
[im 3/32  brain]
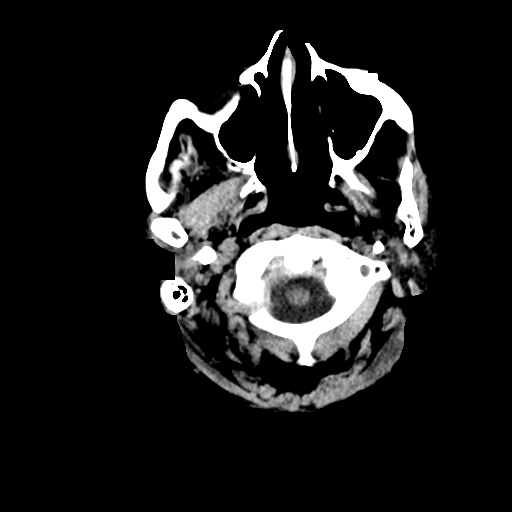
[im 3/32  bone]
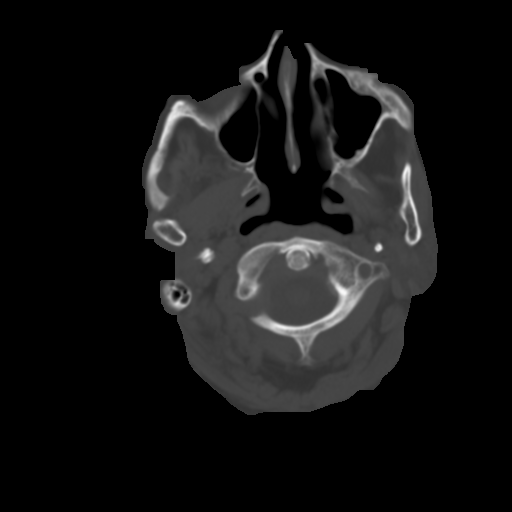
[im 6/32  brain]
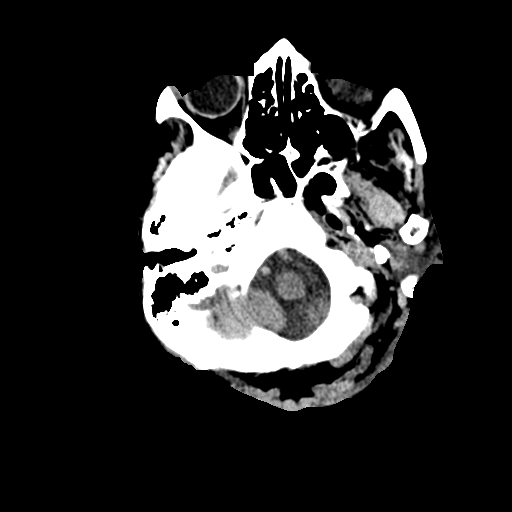
[im 9/32  brain]
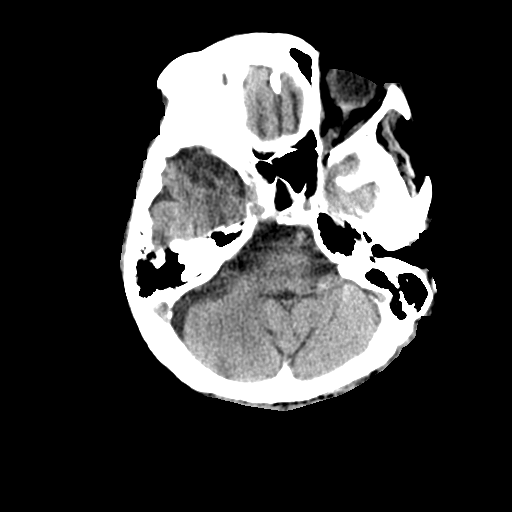
[im 11/32  brain]
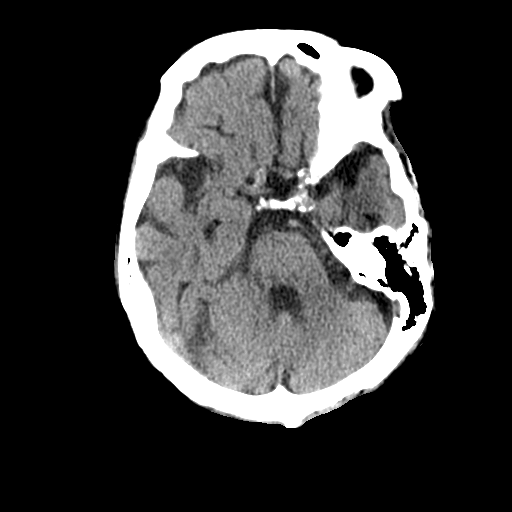
[im 14/32  brain]
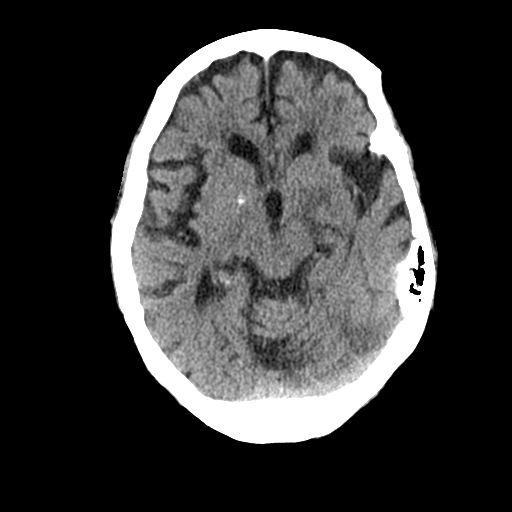
[im 14/32  bone]
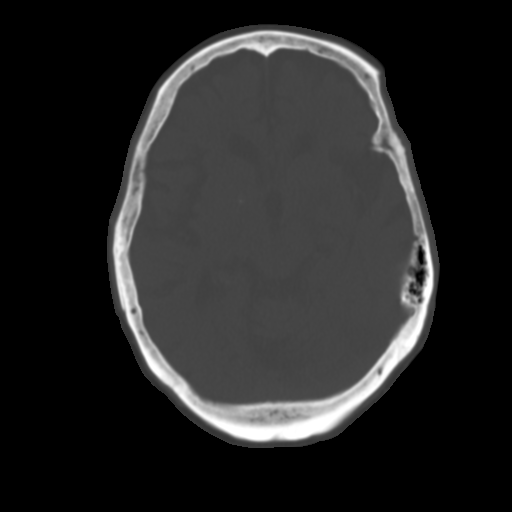
[im 18/32  brain]
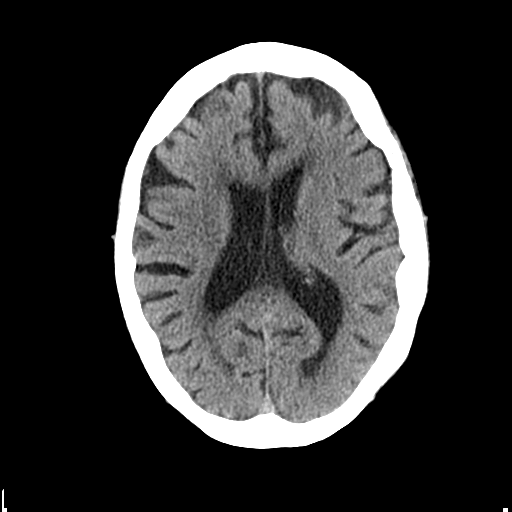
[im 21/32  brain]
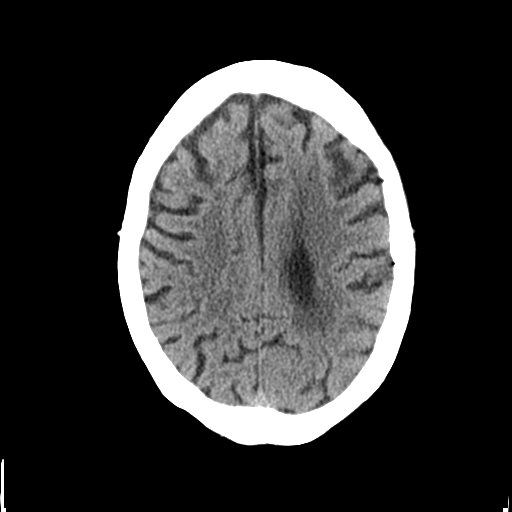
[im 24/32  brain]
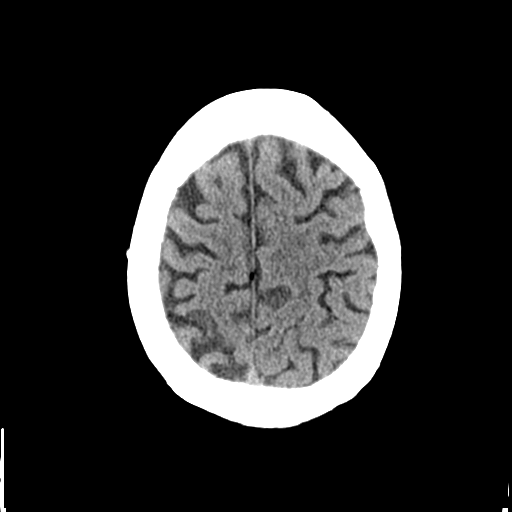
[im 26/32  brain]
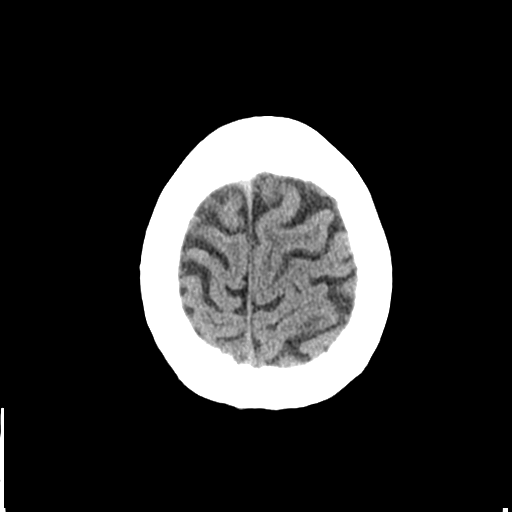
[im 26/32  bone]
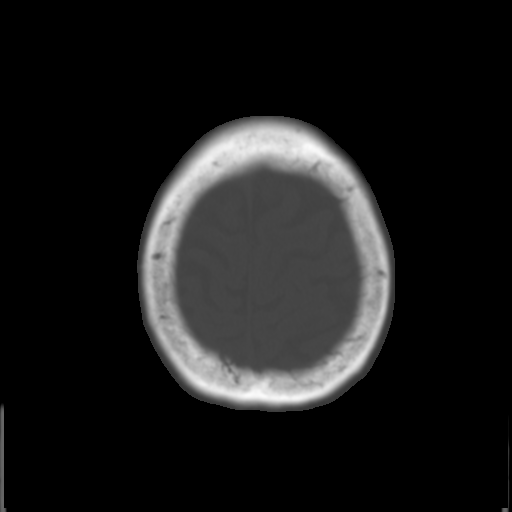
[im 29/32  brain]
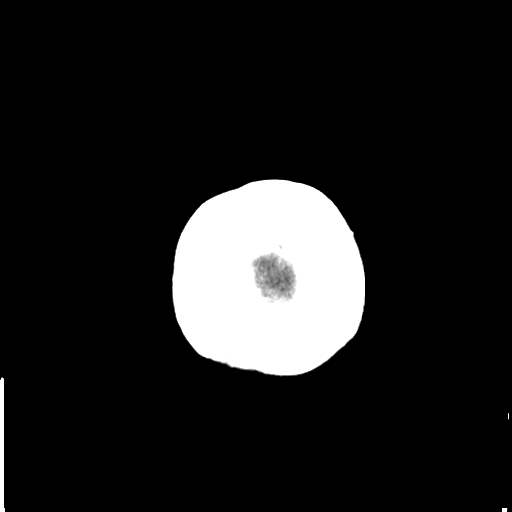

[Series 4: coronal soft tissue · coronal · 0.33mm/px · 3 of 67 slices shown]
[im 23/67  brain]
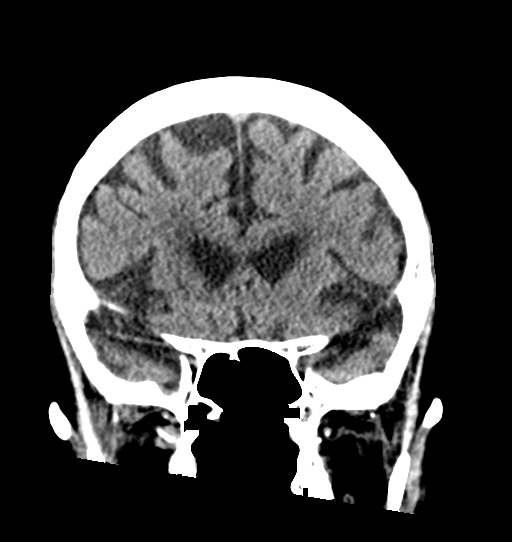
[im 30/67  brain]
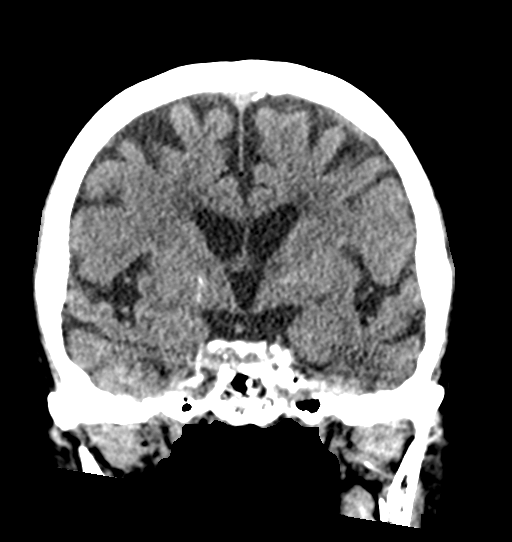
[im 37/67  brain]
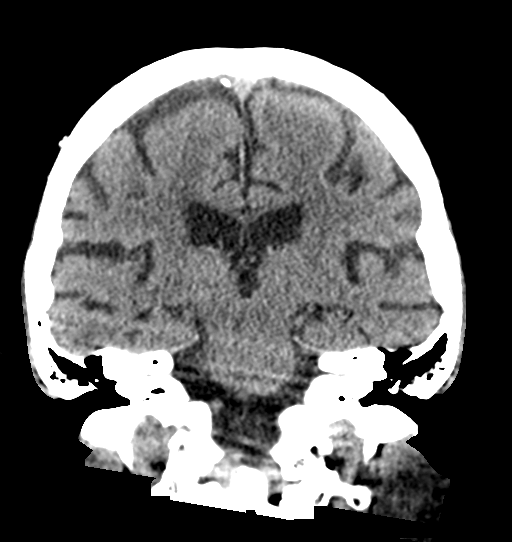

[Series 5: sagittal soft tissue · sagittal · 0.35mm/px · 3 of 56 slices shown]
[im 21/56  brain]
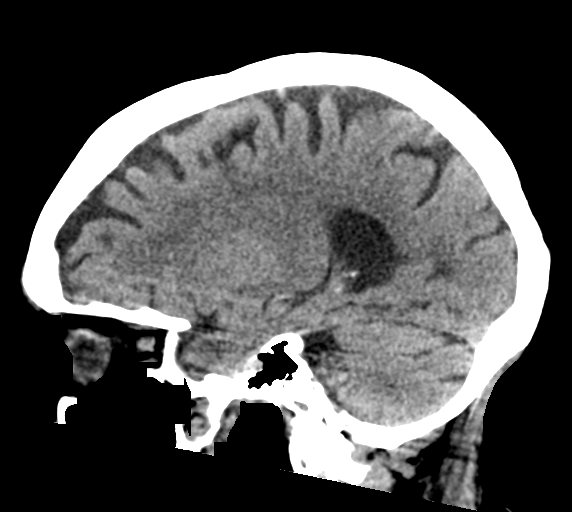
[im 28/56  brain]
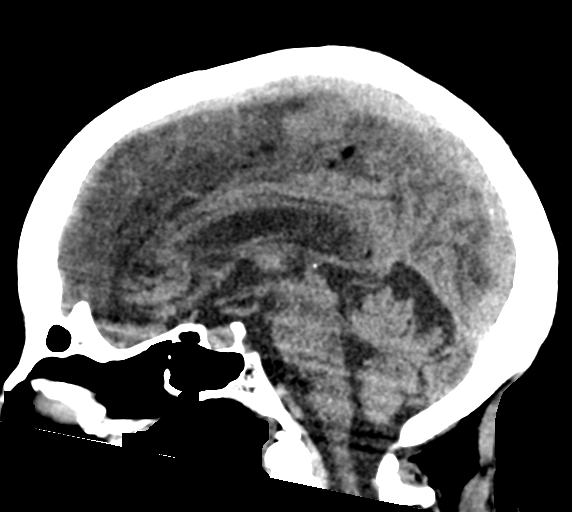
[im 35/56  brain]
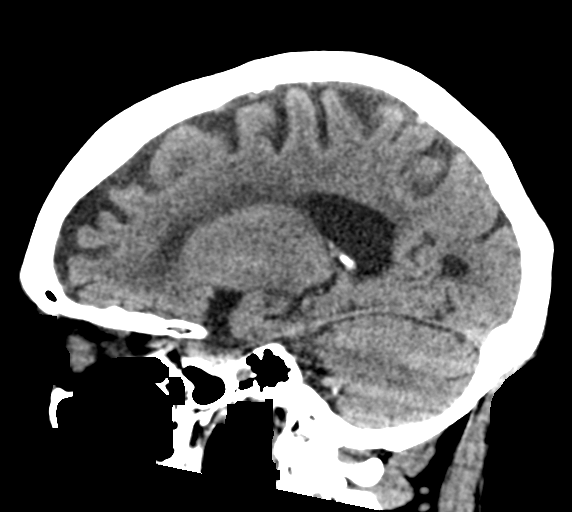

[16 of 47 positions shown; findings below may reference images not displayed]

FINDINGS: Brain: There is no acute intracranial hemorrhage, extra-axial fluid
collection, or acute infarct.

There is mild global parenchymal volume loss and chronic white
matter microangiopathy. The ventricles are not enlarged. There is no
mass lesion. There is no midline shift.

Vascular: There is calcification of the bilateral cavernous ICAs.

Skull: Normal. Negative for fracture or focal lesion.

Sinuses/Orbits: The imaged paranasal sinuses are clear. Bilateral
lens implants are in place. The globes and orbits are otherwise
unremarkable.

Other: None.
IMPRESSION: No acute intracranial hemorrhage or calvarial fracture.

## 2022-05-03 IMAGING — CT CT CERVICAL SPINE W/O CM
3 of 4 series · 14 of 35 positions shown, 17 images · non-contrast
Comparison: CTA neck 02/14/2019

CLINICAL DATA: Fall

EXAM:
CT CERVICAL SPINE WITHOUT CONTRAST
TECHNIQUE: Multidetector CT imaging of the cervical spine was performed without
intravenous contrast. Multiplanar CT image reconstructions were also
generated.

[Series 4: sagittal bone · sagittal · 0.36mm/px · 5 of 105 slices shown, 6 images]
[im 35/105  bone]
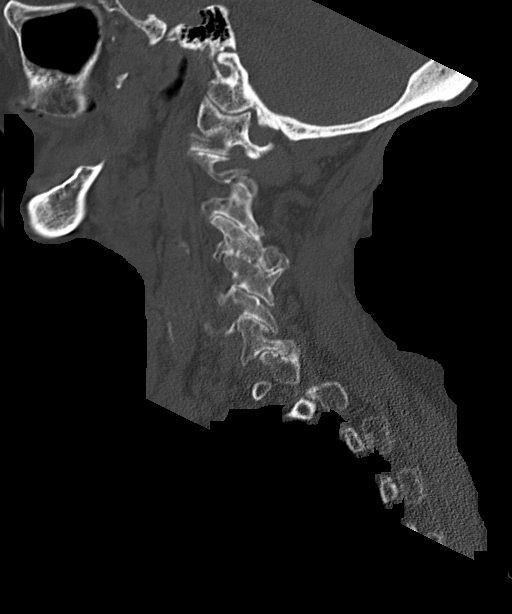
[im 44/105  bone]
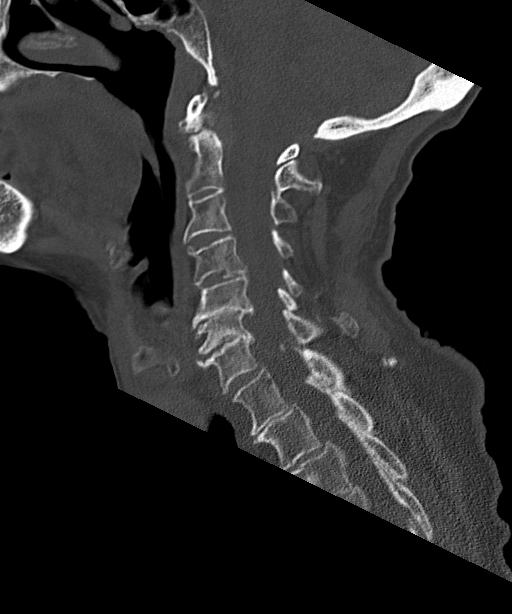
[im 53/105  soft-tissue]
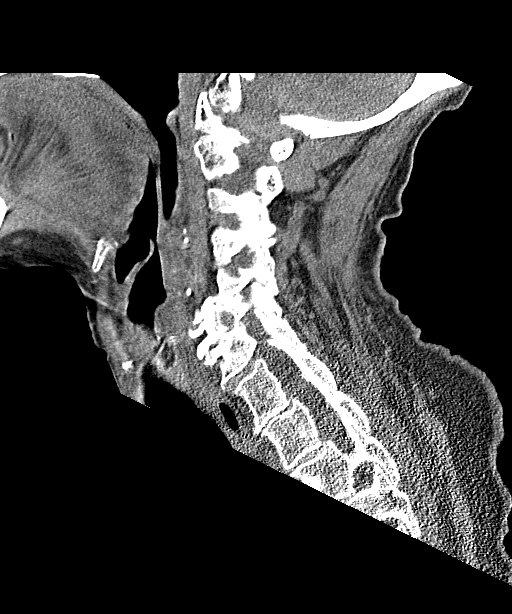
[im 53/105  bone]
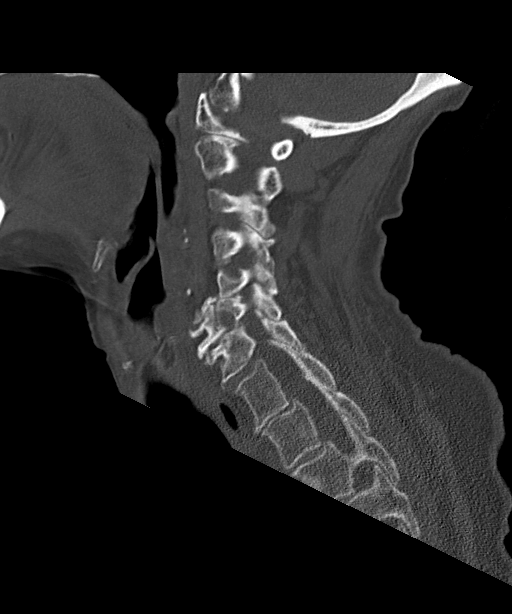
[im 61/105  bone]
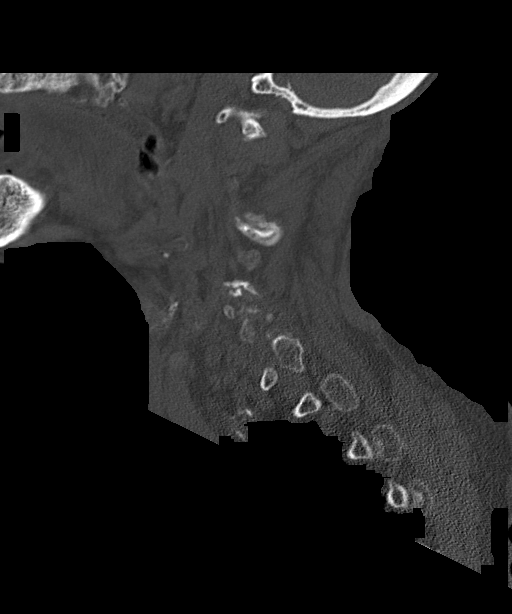
[im 70/105  bone]
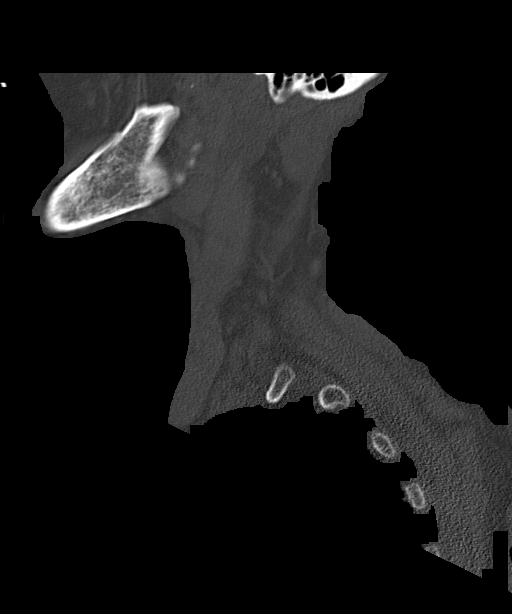

[Series 5: coronal bone · coronal · 0.38mm/px · 3 of 123 slices shown]
[im 43/123  bone]
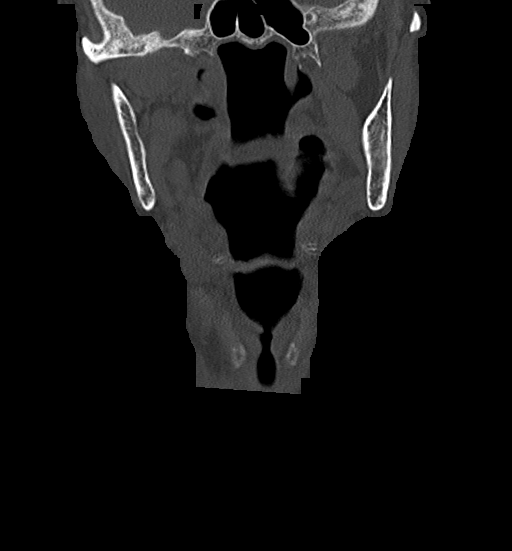
[im 56/123  bone]
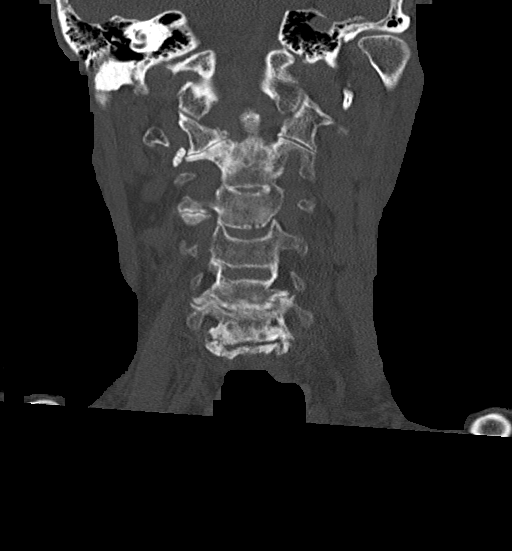
[im 69/123  bone]
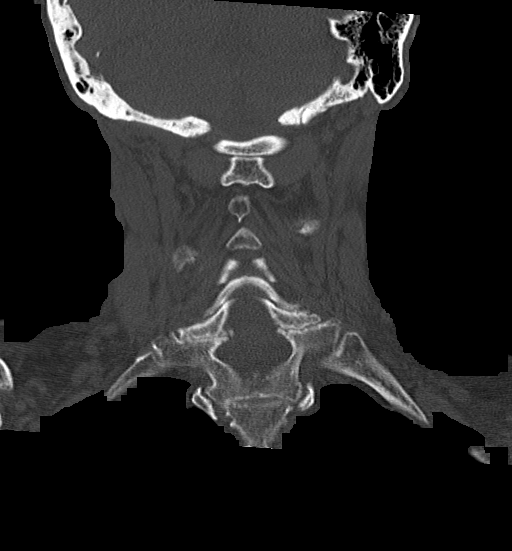

[Series 6: orthogonal bone · axial · 0.41mm/px · z∈[-334,-198]mm · 6 of 113 slices shown, 8 images]
[im 17/113  soft-tissue]
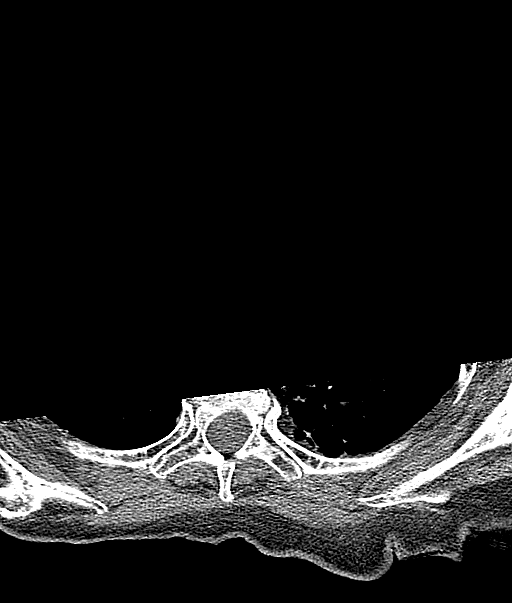
[im 17/113  bone]
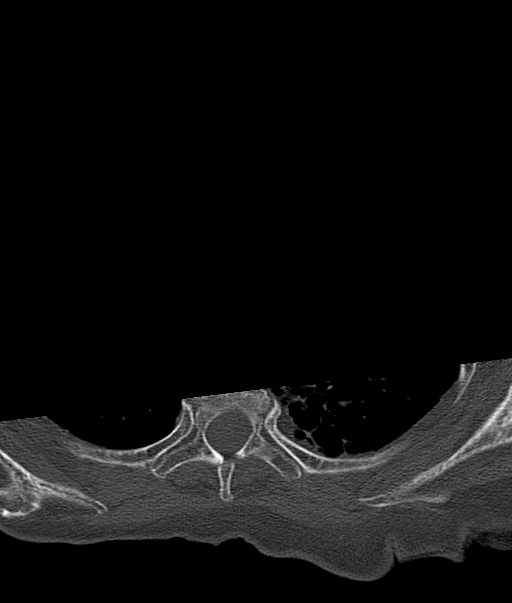
[im 33/113  bone]
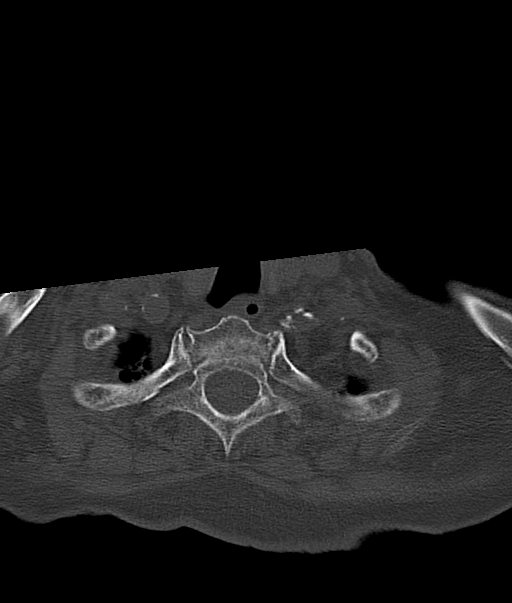
[im 49/113  bone]
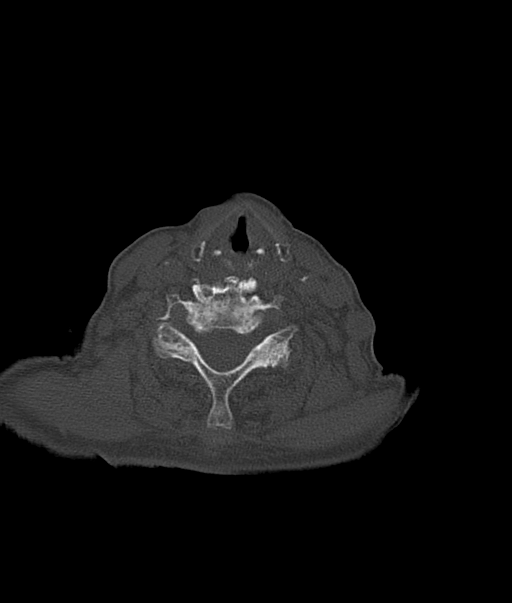
[im 65/113  bone]
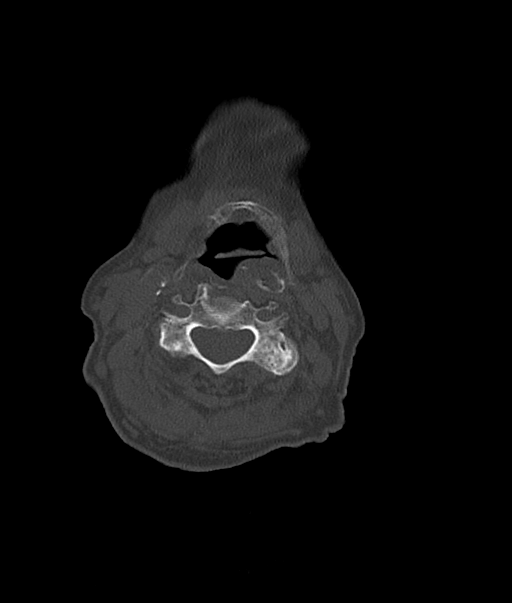
[im 81/113  soft-tissue]
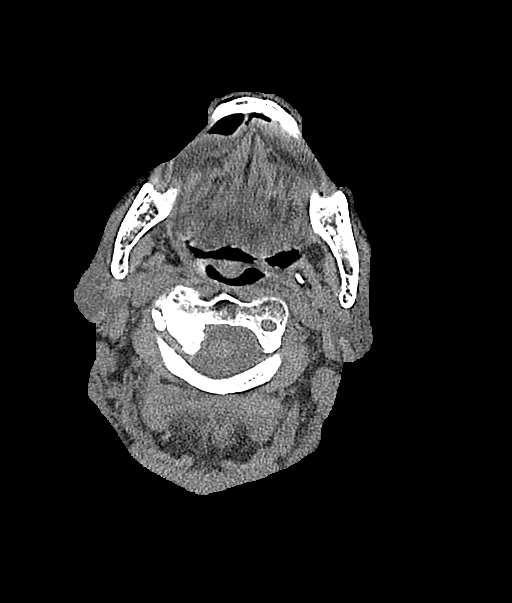
[im 81/113  bone]
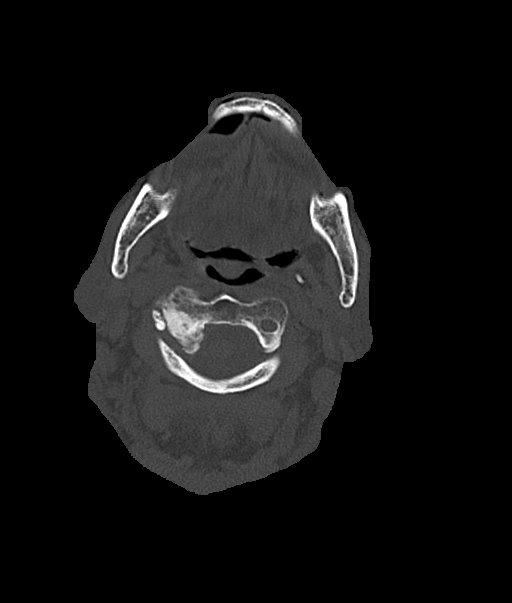
[im 97/113  bone]
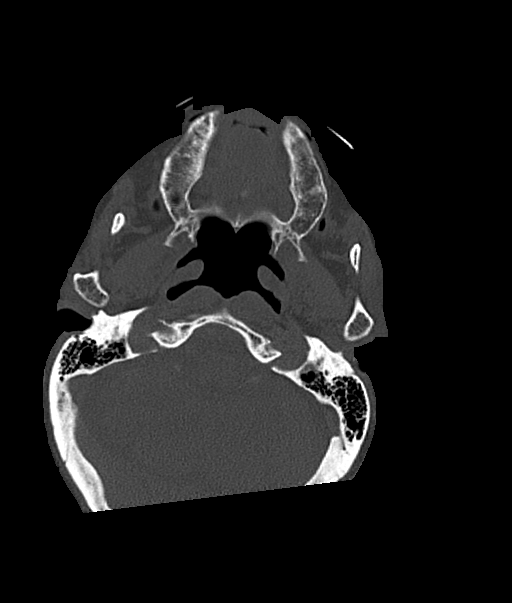

[14 of 35 positions shown; findings below may reference images not displayed]

FINDINGS: Alignment: There is trace anterolisthesis of C3 on C4, grade 1
retrolisthesis of C5 on C6, and grade 1 anterolisthesis of C7 on T1,
not significantly changed since 0000 and likely degenerative in
nature. There is no jumped or perched facet or other evidence of
traumatic malalignment.

Skull base and vertebrae: Skull base alignment is maintained.
Vertebral body heights are preserved. There is no evidence of acute
fracture.

Soft tissues and spinal canal: No prevertebral fluid or swelling. No
visible canal hematoma.

Disc levels: There is marked intervertebral disc space narrowing
with associated uncovertebral arthropathy at C5-C6 and C6-C7. There
is multilevel facet arthropathy with ankylosis of the left posterior
elements at C4-C5. The spinal canal is patent. There is severe right
neural foraminal stenosis at C4-C5 and severe bilateral neural
foraminal stenosis at C5-C6.

Upper chest: There is scarring and bullous change in the left lung
apex, similar to the prior CTA neck. The right lung apex is clear.

Other: There is calcified atherosclerotic plaque of the bilateral
carotid bulbs.
IMPRESSION: 1. No acute fracture or traumatic malalignment of the cervical
spine.
2. Multilevel spondylosis and spondylolisthesis as above, most
advanced at C5-C6.

## 2022-05-05 IMAGING — DX DG ABDOMEN 1V
2 series · 2 of 2 positions shown · non-contrast
Comparison: November 07, 2016

CLINICAL DATA: Constipation, abdominal distension

EXAM:
ABDOMEN - 1 VIEW

[abdomen supine (1 of 2)]
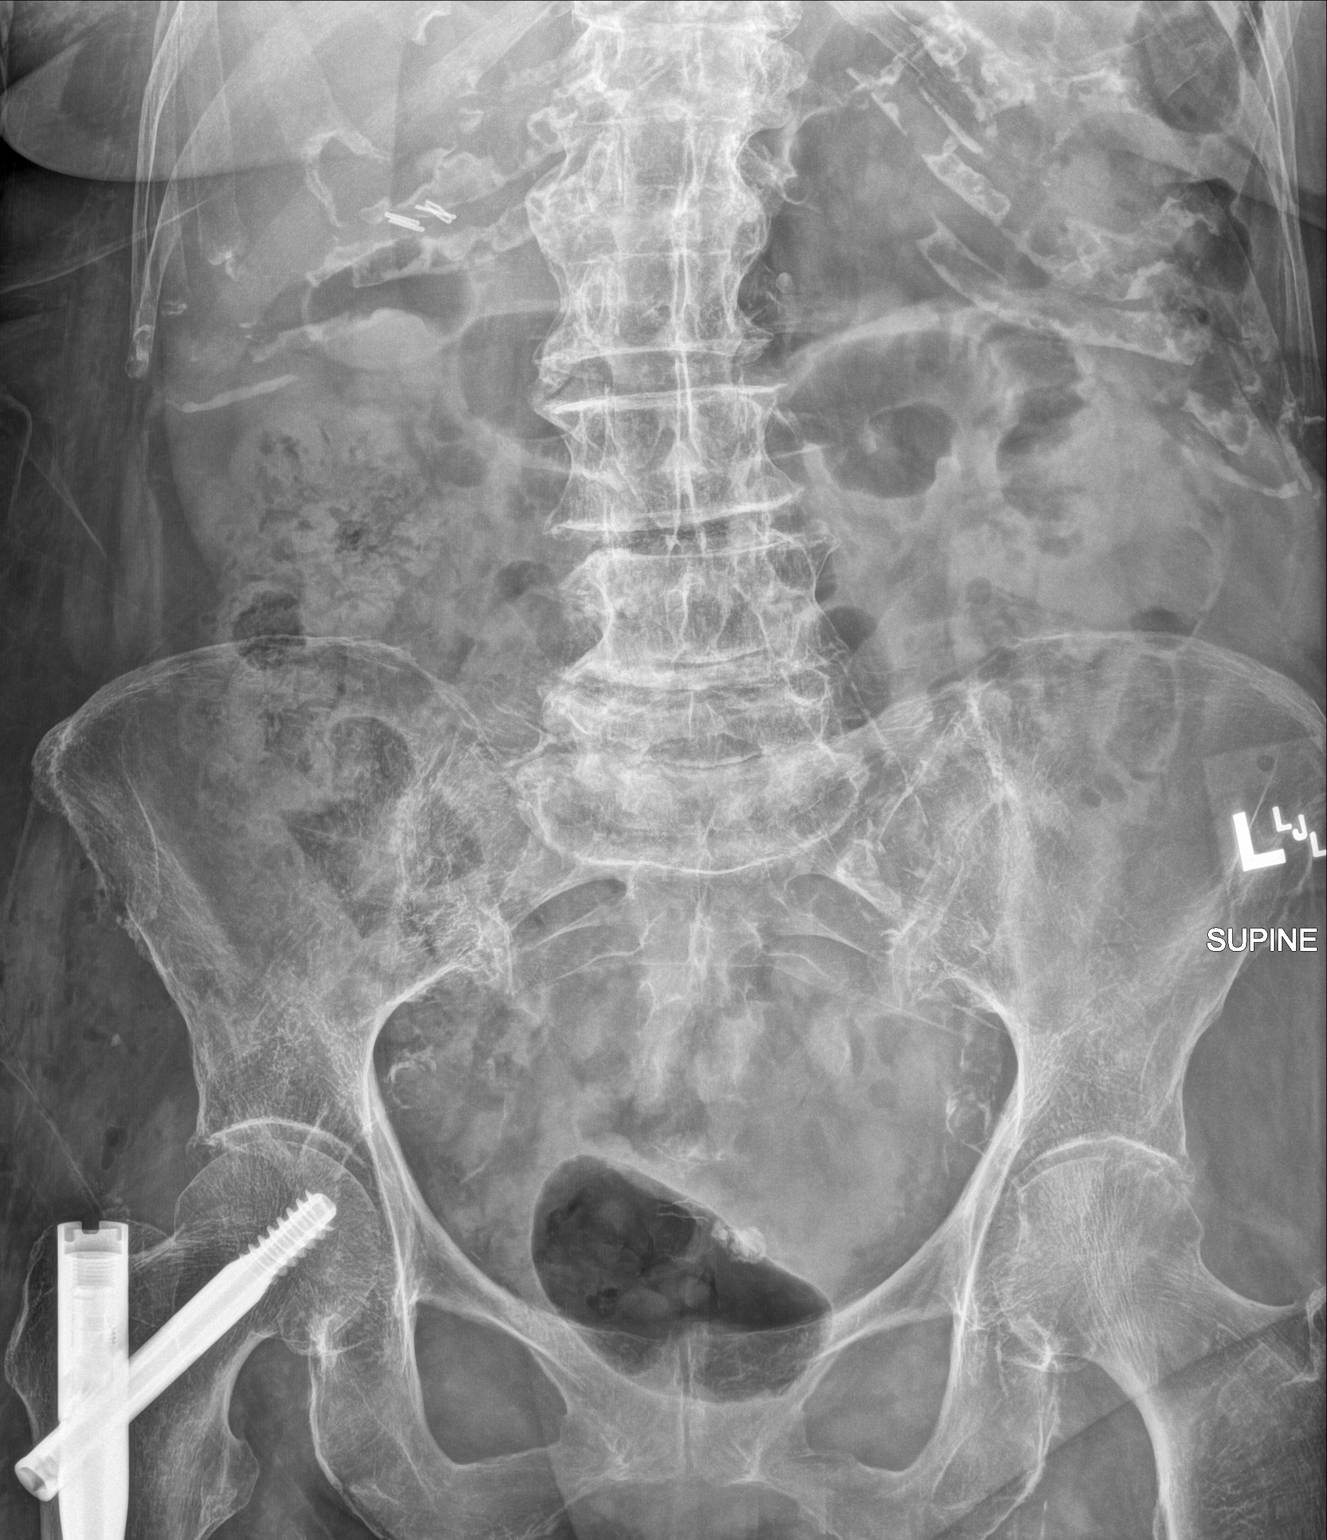

[abdomen supine (2 of 2)]
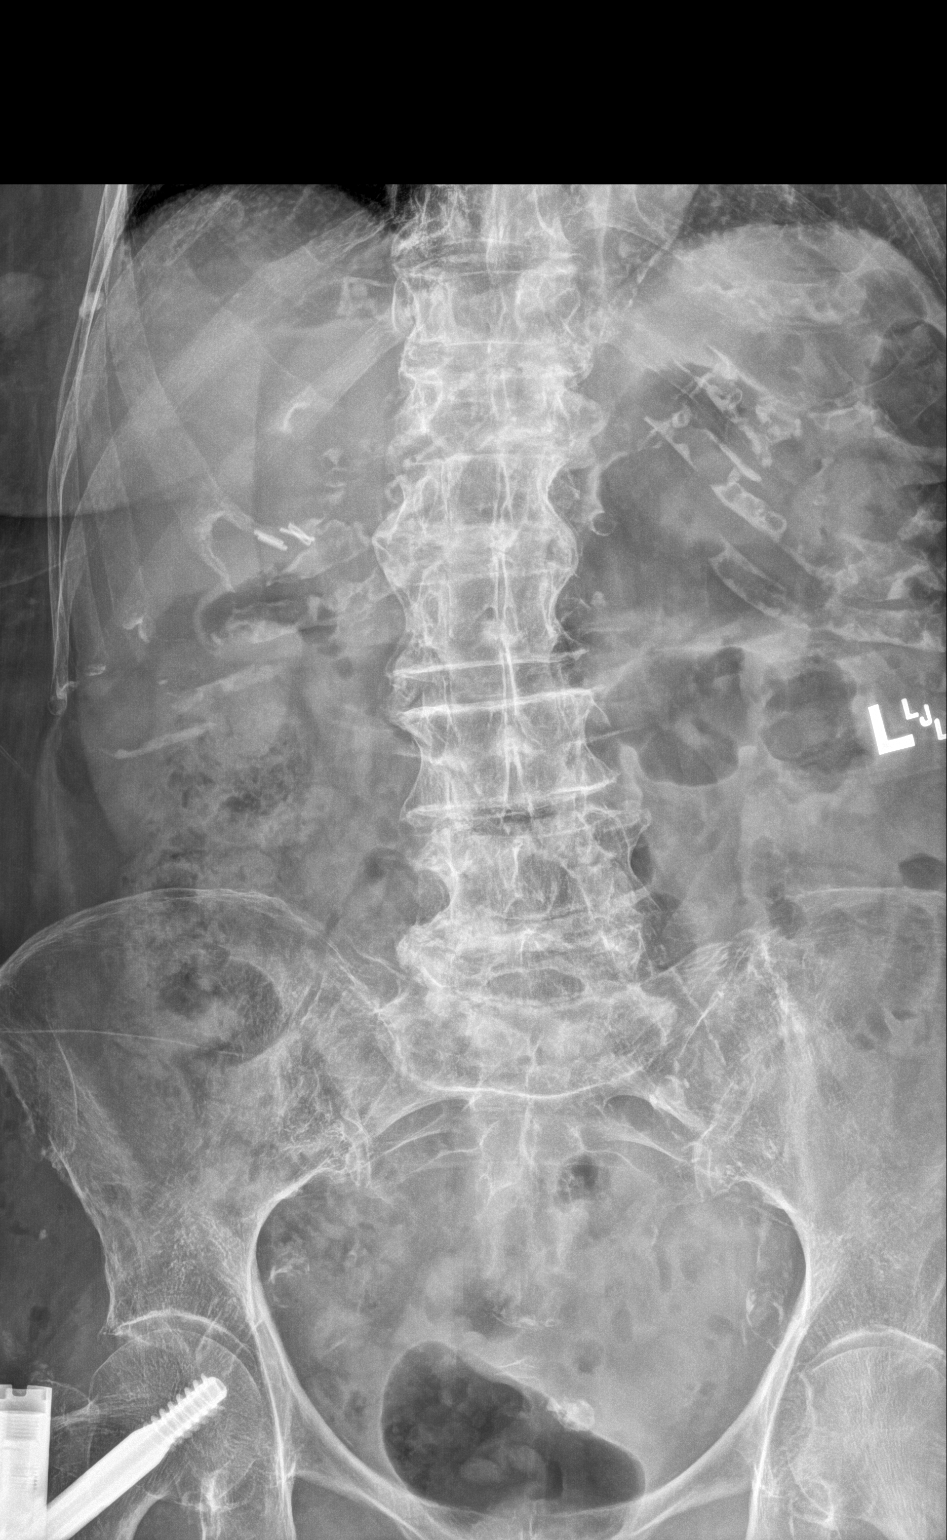

[2 of 2 positions shown; findings below may reference images not displayed]

FINDINGS: Air and stool filled nondilated loops of bowel. Mild colonic stool
burden predominately in the RIGHT hemicolon. Atherosclerotic
calcifications. Osteopenia. Status post intramedullary rod fixation
of the RIGHT femur. Small amount of soft tissue air within the
RIGHT-sided subcutaneous tissues consistent with recent surgical
intervention. Status post cholecystectomy.
IMPRESSION: Nonobstructive bowel gas pattern.

## 2022-05-09 ENCOUNTER — Ambulatory Visit: Payer: Medicare PPO | Admitting: Urology

## 2022-05-09 ENCOUNTER — Encounter: Payer: Self-pay | Admitting: Urology

## 2022-05-09 VITALS — BP 180/83 | HR 75 | Ht 65.0 in | Wt 120.0 lb

## 2022-05-09 DIAGNOSIS — Z87440 Personal history of urinary (tract) infections: Secondary | ICD-10-CM

## 2022-05-09 DIAGNOSIS — R339 Retention of urine, unspecified: Secondary | ICD-10-CM

## 2022-05-09 DIAGNOSIS — N390 Urinary tract infection, site not specified: Secondary | ICD-10-CM

## 2022-05-09 NOTE — Progress Notes (Signed)
05/09/2022 1:54 PM   [name] July 28, 1919 [id number]  Referring provider: Judy Pimple, MD [address] [place],  [place] [address]  Chief Complaint  Patient presents with   Recurrent UTI    HPI: Tracy Morales is a 88 y.o. female presents to establish urologic care.  Previous patient of Dr. Evelene Morales who is retiring.  She presents today with her daughter.  History of urinary retention on CIC She fell October 2022 sustaining a hip fracture and since that time has an indwelling Foley catheter as she was unable to perform CIC Her catheter is changed by [facility] monthly History of recurrent UTI which typically is managed by palliative care No specific complaints today and the primary purpose of the visit was to establish care    PMH: Past Medical History:  Diagnosis Date   Allergic rhinitis, cause unspecified    Bradycardia    Cerebrovascular disease, unspecified    Chronic kidney disease, unspecified    chronic renal insufficiency   Dizziness and giddiness    DVT (deep venous thrombosis) (HCC)    R   Dyspnea    echo with normal EF. 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   HTN (hypertension)    Hyperlipidemia    Hypertonicity of bladder    hx   Mild depression    Mononeuritis of lower limb, unspecified    peripheral neuropathy, bilateral   Occlusion and stenosis of carotid artery without mention of cerebral infarction 09/03/2007   u/s L 40-59%, R 0-39%; stable for many years   Osteoarthrosis, unspecified whether generalized or localized, unspecified site    Peripheral vascular disease, unspecified (HCC)    Polymyalgia rheumatica (HCC)    hx    Surgical History: Past Surgical History:  Procedure Laterality Date   2D echo  8/06   mild aortic stenosis.    arterial doppler     carotid doppler  4/02   bilateral plaque    carotid doppler  8/06   non significant stenosis   carotid doppler     R 40%; L  40-60%   CATARACT EXTRACTION     CHOLECYSTECTOMY  2000   COLONOSCOPY  1/04   polyps   CT SCAN  05/25/08   abd and pelvis- inflated urinary bladder and large amt stool throughout w/o blockage    dexa  2/01   osteopenia   echo     mild LVH EF 50% As   FEMUR IM NAIL Right 03/18/2021   Procedure: INTRAMEDULLARY (IM) NAIL FEMORAL;  Surgeon: Christena Flake, MD;  Location: [facility] ORS;  Service: Orthopedics;  Laterality: Right;   triger finger contracture  6/04   vert. basilar insufficiency  2002   zoster  5/02    Home Medications:  Allergies as of 05/09/2022       Reactions   Ace Inhibitors    REACTION: cough   Donepezil Diarrhea, Other (See Comments)   GI problems Other reaction(s): Other (See Comments) GI problems   Sulfa Antibiotics Nausea Only   headache        Medication List        Accurate as of May 09, 2022  1:54 PM. If you have any questions, ask your nurse or doctor.          acetaminophen 500 MG tablet Commonly known as: TYLENOL Take 500 mg by mouth every 6 (six) hours as needed. OTC-UAD  amLODipine 5 MG tablet Commonly known as: NORVASC TAKE 1 TABLET BY MOUTH EVERY DAY   ascorbic acid 500 MG tablet Commonly known as: VITAMIN C Take 500 mg by mouth 3 (three) times daily.   aspirin EC 325 MG tablet Take 325 mg by mouth daily.   BONINE PO Take 50 mg by mouth as needed.   cephALEXin 500 MG capsule Commonly known as: KEFLEX Take 1 capsule (500 mg total) by mouth 2 (two) times daily.   CRANBERRY PO Take 1 tablet by mouth daily.   [facility] Cranberry 500 MG Caps Generic drug: Cranberry Take 1 capsule by mouth 3 (three) times daily.   famotidine 20 MG tablet Commonly known as: PEPCID Take 1 tablet (20 mg total) by mouth 2 (two) times daily.   feeding supplement Liqd Take 237 mLs by mouth 2 (two) times daily between meals.   LORazepam 0.5 MG tablet Commonly known as: ATIVAN Take 1 tablet (0.5 mg total) by mouth at bedtime. Caution of sedation    mirtazapine 30 MG tablet Commonly known as: REMERON TAKE 1 TABLET BY MOUTH EVERYDAY AT BEDTIME   omeprazole 20 MG capsule Commonly known as: PRILOSEC TAKE 1 CAPSULE BY MOUTH EVERY DAY   polyethylene glycol 17 g packet Commonly known as: MIRALAX / GLYCOLAX Take 17 g by mouth 2 (two) times daily.   psyllium 58.6 % packet Commonly known as: METAMUCIL Take 1 packet by mouth daily as needed.   simvastatin 20 MG tablet Commonly known as: ZOCOR TAKE 1 TABLET BY MOUTH EVERY DAY   Vitamin D3 50 MCG (2000 UT) capsule Take 2,000 Units by mouth daily.   Vitamin D3 50 MCG (2000 UT) Tabs Take by mouth.        Allergies:  Allergies  Allergen Reactions   Ace Inhibitors     REACTION: cough   Donepezil Diarrhea and Other (See Comments)    GI problems Other reaction(s): Other (See Comments) GI problems   Sulfa Antibiotics Nausea Only    headache    Family History: Family History  Problem Relation Age of Onset   Hypertension Father    Other Father        Cardiac problems   Heart disease Father    Other Brother        blood clot   Other Sister        benign breast nodule    Social History:  reports that she has never smoked. She has never used smokeless tobacco. She reports that she does not drink alcohol and does not use drugs.   Physical Exam: BP (!) 180/83   Pulse 75   Ht 5\' 5"  (1.651 m)   Wt 120 lb (54.4 kg)   BMI 19.97 kg/m   Constitutional:  Alert and oriented, No acute distress. HEENT: [place] AT Respiratory: Normal respiratory effort, no increased work of breathing.    Assessment & Plan:    1.  Chronic urinary retention Continue home health catheter changes Follow-up annually  2.  Recurrent UTI Follow-up prn   , MD  [facility] [address] [place], [place] [phone]

## 2022-05-15 ENCOUNTER — Telehealth: Payer: Self-pay

## 2022-05-15 NOTE — Telephone Encounter (Signed)
PC SW outreached daughter to complete telephonic check in on patient and review/discss new PC criteria.  Call unsuccessful. SW LVM with contact information.

## 2022-06-05 DIAGNOSIS — Z8616 Personal history of COVID-19: Secondary | ICD-10-CM

## 2022-06-05 DIAGNOSIS — G629 Polyneuropathy, unspecified: Secondary | ICD-10-CM

## 2022-06-05 DIAGNOSIS — K5909 Other constipation: Secondary | ICD-10-CM

## 2022-06-05 DIAGNOSIS — G3184 Mild cognitive impairment, so stated: Secondary | ICD-10-CM | POA: Diagnosis not present

## 2022-06-05 DIAGNOSIS — M160 Bilateral primary osteoarthritis of hip: Secondary | ICD-10-CM | POA: Diagnosis not present

## 2022-06-05 DIAGNOSIS — M19011 Primary osteoarthritis, right shoulder: Secondary | ICD-10-CM

## 2022-06-05 DIAGNOSIS — S72321D Displaced transverse fracture of shaft of right femur, subsequent encounter for closed fracture with routine healing: Secondary | ICD-10-CM | POA: Diagnosis not present

## 2022-06-05 DIAGNOSIS — I739 Peripheral vascular disease, unspecified: Secondary | ICD-10-CM

## 2022-06-05 DIAGNOSIS — E440 Moderate protein-calorie malnutrition: Secondary | ICD-10-CM

## 2022-06-05 DIAGNOSIS — D631 Anemia in chronic kidney disease: Secondary | ICD-10-CM

## 2022-06-05 DIAGNOSIS — M353 Polymyalgia rheumatica: Secondary | ICD-10-CM

## 2022-06-05 DIAGNOSIS — J309 Allergic rhinitis, unspecified: Secondary | ICD-10-CM

## 2022-06-05 DIAGNOSIS — Z86718 Personal history of other venous thrombosis and embolism: Secondary | ICD-10-CM

## 2022-06-05 DIAGNOSIS — F32A Depression, unspecified: Secondary | ICD-10-CM

## 2022-06-05 DIAGNOSIS — R32 Unspecified urinary incontinence: Secondary | ICD-10-CM

## 2022-06-05 DIAGNOSIS — I1310 Hypertensive heart and chronic kidney disease without heart failure, with stage 1 through stage 4 chronic kidney disease, or unspecified chronic kidney disease: Secondary | ICD-10-CM

## 2022-06-05 DIAGNOSIS — R1310 Dysphagia, unspecified: Secondary | ICD-10-CM

## 2022-06-05 DIAGNOSIS — Z9181 History of falling: Secondary | ICD-10-CM

## 2022-06-05 DIAGNOSIS — R339 Retention of urine, unspecified: Secondary | ICD-10-CM | POA: Diagnosis not present

## 2022-06-05 DIAGNOSIS — F341 Dysthymic disorder: Secondary | ICD-10-CM | POA: Diagnosis not present

## 2022-06-05 DIAGNOSIS — Z87440 Personal history of urinary (tract) infections: Secondary | ICD-10-CM

## 2022-06-05 DIAGNOSIS — D509 Iron deficiency anemia, unspecified: Secondary | ICD-10-CM

## 2022-06-05 DIAGNOSIS — N1832 Chronic kidney disease, stage 3b: Secondary | ICD-10-CM

## 2022-06-05 DIAGNOSIS — N3281 Overactive bladder: Secondary | ICD-10-CM | POA: Diagnosis not present

## 2022-06-05 DIAGNOSIS — Z7982 Long term (current) use of aspirin: Secondary | ICD-10-CM

## 2022-06-05 DIAGNOSIS — Z466 Encounter for fitting and adjustment of urinary device: Secondary | ICD-10-CM | POA: Diagnosis not present

## 2022-06-05 DIAGNOSIS — I6529 Occlusion and stenosis of unspecified carotid artery: Secondary | ICD-10-CM | POA: Diagnosis not present

## 2022-06-05 DIAGNOSIS — N318 Other neuromuscular dysfunction of bladder: Secondary | ICD-10-CM | POA: Diagnosis not present

## 2022-06-05 DIAGNOSIS — Z8673 Personal history of transient ischemic attack (TIA), and cerebral infarction without residual deficits: Secondary | ICD-10-CM

## 2022-06-05 DIAGNOSIS — H900 Conductive hearing loss, bilateral: Secondary | ICD-10-CM

## 2022-06-05 DIAGNOSIS — E785 Hyperlipidemia, unspecified: Secondary | ICD-10-CM

## 2022-06-10 ENCOUNTER — Other Ambulatory Visit: Payer: Self-pay | Admitting: Family Medicine

## 2022-06-25 ENCOUNTER — Telehealth: Payer: Self-pay | Admitting: Family Medicine

## 2022-06-25 ENCOUNTER — Other Ambulatory Visit: Payer: Medicare PPO

## 2022-06-25 DIAGNOSIS — Z515 Encounter for palliative care: Secondary | ICD-10-CM

## 2022-06-25 NOTE — Telephone Encounter (Signed)
Patient daughter called in and would like an updated list of Charita's medication. She can be reached at [phone]. Thank you!

## 2022-06-25 NOTE — Progress Notes (Signed)
PATIENT NAME: Tracy Morales DOB: [birth year] MRN: [id number]  PRIMARY CARE PROVIDER: Abner Greenspan, MD  RESPONSIBLE PARTY:  Acct ID - Guarantor Home Phone Work Phone Relationship Acct Type  [id number] Birder [phone]  Self P/F     [place], [place], [place] [address]   I connected with [place] on 06/25/22 by phone and verified that I am speaking with the correct person using two identifiers.   I discussed the limitations of evaluation and management by telemedicine. The patient expressed understanding and agreed to proceed.   Connected with daughter Enis Slipper to update her on changes in the Palliative Care Program.  Daughter agreeable to continue.  Patient is wheelchair bound.  Appetite has been fair but liquid intake is poor.  Daughter endorses keeping liquids by the patient but still has difficulty getting her to consume.  Breakfast is the best meal.  Lunch is usually hit or miss.  No recent weights.  I will do a mid-arm circumference on home visit this week.   Patient continues to have hallucinations but not bothersome to patient.   Visit scheduled for Thursday at 6 with patient and daughter.   CODE STATUS: Full will discuss on next visit. ADVANCED DIRECTIVES: Yes MOST FORM: Yes   Lorenza Burton, RN

## 2022-06-25 NOTE — Telephone Encounter (Signed)
Med list printed and pt's son-in-law will pick up from the front desk.

## 2022-06-27 ENCOUNTER — Telehealth: Payer: Self-pay | Admitting: Family Medicine

## 2022-06-27 NOTE — Telephone Encounter (Signed)
Error

## 2022-06-28 ENCOUNTER — Other Ambulatory Visit: Payer: Medicare PPO

## 2022-06-28 VITALS — BP 132/70 | HR 71 | Temp 97.6°F

## 2022-06-28 DIAGNOSIS — Z515 Encounter for palliative care: Secondary | ICD-10-CM

## 2022-06-28 NOTE — Progress Notes (Signed)
PATIENT NAME: Tracy Morales DOB: 02/21/1920 MRN: [id number]  PRIMARY CARE PROVIDER: Abner Greenspan, MD  RESPONSIBLE PARTY:  Acct ID - Guarantor Home Phone Work Phone Relationship Acct Type  [id number] SHAHARA, [phone]  Self P/F     [place], [place], [place] [address]   Home visit completed with patient and daughter [name].   ACP:  Reviewed with daughter.  AD in place.  Daughter is uncertain if they have a DNR form in the home.  She will check in patient's folder and if they do not she will reach back out to me.  Advised I could speak with Dr. Glori Bickers about need for DNR form.  If they do have it, daughter will post on the fridge.  Appetite:  Fair to poor.  Mostly eating breakfast and sometimes lunch.  Usually skipping dinner.  Daughter is providing supplement drink at dinner.  Mid-arm circumference to the left arm remains at 29.5 cm.  Dementia:   FAST Score: 6D.  Patient engages in short conversations.  Able to answer questions appropriately but difficulty with short term memory.  No longer ambulatory.  Now using a wheelchair in the home.  No recent falls.  Foley cath in place. Wearing briefs due to occasional bowel incontinence.  Patient is able to feed herself.  Daughter reports dysphagia and coughing with mostly liquids.   Dementia Resource booklet provided to daughter and reviewed.    CODE STATUS: Desires DNR see above ADVANCED DIRECTIVES: No MOST FORM: not on file PPS: 40%   PHYSICAL EXAM:   VITALS: Today's Vitals   06/28/22 1100  BP: 132/70  Pulse: 71  Temp: 97.6 F (36.4 C)  SpO2: 96%  PainSc: 0-No pain    LUNGS: clear to auscultation  CARDIAC: Cor RRR}  EXTREMITIES: - for edema SKIN: Skin color, texture, turgor normal. No rashes or lesions or normal  NEURO: positive for gait problems and memory problems       Lorenza Burton, RN

## 2022-07-02 ENCOUNTER — Other Ambulatory Visit: Payer: Medicare PPO

## 2022-07-02 ENCOUNTER — Telehealth: Payer: Self-pay

## 2022-07-02 ENCOUNTER — Telehealth: Payer: Self-pay | Admitting: Family Medicine

## 2022-07-02 DIAGNOSIS — Z515 Encounter for palliative care: Secondary | ICD-10-CM

## 2022-07-02 NOTE — Telephone Encounter (Signed)
Tracy Morales from palliative care called and would ike to Know if a DNR can be signed for her by [name] stated that they do not have one in the home? She stated that she cam come by today and pick it up in route to taking her some Dispers?

## 2022-07-02 NOTE — Progress Notes (Signed)
PATIENT NAME: Tracy Morales DOB: 09-Nov-1919 MRN: [id number]  PRIMARY CARE PROVIDER: Abner Greenspan, MD  RESPONSIBLE PARTY:  Acct ID - Guarantor Home Phone Work Phone Relationship Acct Type  [id number] Birder Robson407 [phone]  Self P/F     [place], [place], [place] [id number]   Obtained signed DNR form from PCP office.  Arrived at patient's home and spoke with daughter Tracy Morales and provided DNR form and 2 packs of donated briefs.  Daughter aware to post DNR form somewhere visible in the event EMS is called to the home it would be accessible.  No new concerns or needs voiced by daughter.  Follow up visit is scheduled for March at daughter's request.  Patient will follow up with PCP next month.   Lorenza Burton, RN

## 2022-07-02 NOTE — Telephone Encounter (Signed)
8 am.  Message received from daughter Enis Slipper regarding DNR form.  She was unable to locate DNR in her paperwork and is requesting form.  Phone call made to PCP office to advise of need for DNR form.

## 2022-07-02 NOTE — Telephone Encounter (Signed)
Left VM letting her know form ready for pick up

## 2022-07-02 NOTE — Telephone Encounter (Signed)
Done and in IN box 

## 2022-07-07 DIAGNOSIS — R32 Unspecified urinary incontinence: Secondary | ICD-10-CM | POA: Diagnosis not present

## 2022-07-07 DIAGNOSIS — K5900 Constipation, unspecified: Secondary | ICD-10-CM | POA: Diagnosis not present

## 2022-07-07 DIAGNOSIS — M1990 Unspecified osteoarthritis, unspecified site: Secondary | ICD-10-CM | POA: Diagnosis not present

## 2022-07-07 DIAGNOSIS — R42 Dizziness and giddiness: Secondary | ICD-10-CM | POA: Diagnosis not present

## 2022-07-07 DIAGNOSIS — E785 Hyperlipidemia, unspecified: Secondary | ICD-10-CM | POA: Diagnosis not present

## 2022-07-07 DIAGNOSIS — K219 Gastro-esophageal reflux disease without esophagitis: Secondary | ICD-10-CM | POA: Diagnosis not present

## 2022-07-07 DIAGNOSIS — J309 Allergic rhinitis, unspecified: Secondary | ICD-10-CM | POA: Diagnosis not present

## 2022-07-07 DIAGNOSIS — R2681 Unsteadiness on feet: Secondary | ICD-10-CM | POA: Diagnosis not present

## 2022-07-07 DIAGNOSIS — N189 Chronic kidney disease, unspecified: Secondary | ICD-10-CM | POA: Diagnosis not present

## 2022-07-12 ENCOUNTER — Telehealth: Payer: Self-pay | Admitting: Family Medicine

## 2022-07-12 DIAGNOSIS — N1832 Chronic kidney disease, stage 3b: Secondary | ICD-10-CM

## 2022-07-12 DIAGNOSIS — E7800 Pure hypercholesterolemia, unspecified: Secondary | ICD-10-CM

## 2022-07-12 DIAGNOSIS — I10 Essential (primary) hypertension: Secondary | ICD-10-CM

## 2022-07-12 DIAGNOSIS — D500 Iron deficiency anemia secondary to blood loss (chronic): Secondary | ICD-10-CM

## 2022-07-12 DIAGNOSIS — R7309 Other abnormal glucose: Secondary | ICD-10-CM

## 2022-07-12 NOTE — Telephone Encounter (Signed)
-----   Message from Velna Hatchet, RT sent at 06/28/2022  9:00 AM EST ----- Regarding: Fri 2/9 lab Patient is scheduled for cpx, please order future labs.  Thanks, Anda Kraft

## 2022-07-13 ENCOUNTER — Telehealth: Payer: Self-pay

## 2022-07-13 ENCOUNTER — Other Ambulatory Visit (INDEPENDENT_AMBULATORY_CARE_PROVIDER_SITE_OTHER): Payer: Medicare PPO

## 2022-07-13 DIAGNOSIS — N1832 Chronic kidney disease, stage 3b: Secondary | ICD-10-CM | POA: Diagnosis not present

## 2022-07-13 DIAGNOSIS — R7309 Other abnormal glucose: Secondary | ICD-10-CM | POA: Diagnosis not present

## 2022-07-13 DIAGNOSIS — D500 Iron deficiency anemia secondary to blood loss (chronic): Secondary | ICD-10-CM | POA: Diagnosis not present

## 2022-07-13 DIAGNOSIS — E7800 Pure hypercholesterolemia, unspecified: Secondary | ICD-10-CM | POA: Diagnosis not present

## 2022-07-13 DIAGNOSIS — I10 Essential (primary) hypertension: Secondary | ICD-10-CM | POA: Diagnosis not present

## 2022-07-13 LAB — CBC WITH DIFFERENTIAL/PLATELET
Basophils Absolute: 0 10*3/uL (ref 0.0–0.1)
Basophils Relative: 1 % (ref 0.0–3.0)
Eosinophils Absolute: 0.1 10*3/uL (ref 0.0–0.7)
Eosinophils Relative: 2.3 % (ref 0.0–5.0)
HCT: 40 % (ref 36.0–46.0)
Hemoglobin: 13.3 g/dL (ref 12.0–15.0)
Lymphocytes Relative: 48.7 % — ABNORMAL HIGH (ref 12.0–46.0)
Lymphs Abs: 2.4 10*3/uL (ref 0.7–4.0)
MCHC: 33.3 g/dL (ref 30.0–36.0)
MCV: 89.4 fl (ref 78.0–100.0)
Monocytes Absolute: 0.6 10*3/uL (ref 0.1–1.0)
Monocytes Relative: 11.9 % (ref 3.0–12.0)
Neutro Abs: 1.8 10*3/uL (ref 1.4–7.7)
Neutrophils Relative %: 36.1 % — ABNORMAL LOW (ref 43.0–77.0)
Platelets: 280 10*3/uL (ref 150.0–400.0)
RBC: 4.47 Mil/uL (ref 3.87–5.11)
RDW: 14.2 % (ref 11.5–15.5)
WBC: 4.9 10*3/uL (ref 4.0–10.5)

## 2022-07-13 LAB — COMPREHENSIVE METABOLIC PANEL
ALT: 9 U/L (ref 0–35)
AST: 20 U/L (ref 0–37)
Albumin: 3.4 g/dL — ABNORMAL LOW (ref 3.5–5.2)
Alkaline Phosphatase: 125 U/L — ABNORMAL HIGH (ref 39–117)
BUN: 18 mg/dL (ref 6–23)
CO2: 29 mEq/L (ref 19–32)
Calcium: 9 mg/dL (ref 8.4–10.5)
Chloride: 99 mEq/L (ref 96–112)
Creatinine, Ser: 1.43 mg/dL — ABNORMAL HIGH (ref 0.40–1.20)
GFR: 30.03 mL/min — ABNORMAL LOW (ref >60.00)
Glucose, Bld: 92 mg/dL (ref 70–99)
Potassium: 4.4 mEq/L (ref 3.5–5.1)
Sodium: 137 mEq/L (ref 135–145)
Total Bilirubin: 0.5 mg/dL (ref 0.2–1.2)
Total Protein: 6.6 g/dL (ref 6.0–8.3)

## 2022-07-13 LAB — TSH: TSH: 4.69 u[IU]/mL (ref 0.35–5.50)

## 2022-07-13 LAB — VITAMIN D 25 HYDROXY (VIT D DEFICIENCY, FRACTURES): VITD: 104.41 ng/mL (ref 30.00–100.00)

## 2022-07-13 LAB — LIPID PANEL
Cholesterol: 167 mg/dL (ref 0–200)
HDL: 35.5 mg/dL — ABNORMAL LOW (ref >39.00)
LDL Cholesterol: 114 mg/dL — ABNORMAL HIGH (ref 0–99)
NonHDL: 131.89
Total CHOL/HDL Ratio: 5
Triglycerides: 90 mg/dL (ref 0.0–149.0)
VLDL: 18 mg/dL (ref 0.0–40.0)

## 2022-07-13 LAB — IRON: Iron: 71 ug/dL (ref 42–145)

## 2022-07-13 LAB — FERRITIN: Ferritin: 418.4 ng/mL — ABNORMAL HIGH (ref 10.0–291.0)

## 2022-07-13 LAB — HEMOGLOBIN A1C: Hgb A1c MFr Bld: 5.8 % (ref 4.6–6.5)

## 2022-07-13 NOTE — Telephone Encounter (Signed)
CRITICAL VALUE STICKER  CRITICAL VALUE: Vit D 104.4  RECEIVER (on-site recipient of call): Shanece Cochrane   DATE & TIME NOTIFIED: 07/13/22 at 2:46   MESSENGER (representative from lab): Hope   MD NOTIFIED:  Sending phone note and will give book to CMA to make sure provider is aware.   TIME OF NOTIFICATION: 2:50  RESPONSE:  Will wait reply from provider for directions.

## 2022-07-13 NOTE — Telephone Encounter (Signed)
Spoke with daughter San Morelle, advised her of the critical results. She will have pt hold her Vitamin D3

## 2022-07-13 NOTE — Telephone Encounter (Signed)
Please inst caregiver to hold her D for now until we see her and discuss it

## 2022-07-16 NOTE — Telephone Encounter (Signed)
Patient daughter called stating that Dr Loni Beckwith, has her on vitamin C 500 mg, 3 times a day and cranberry supplement 500 mg 3 x a day, she would  like to  know if she should stop taking those as well.

## 2022-07-16 NOTE — Addendum Note (Signed)
Addended by: Tammi Sou on: 07/16/2022 12:12 PM   Modules accepted: Orders

## 2022-07-16 NOTE — Telephone Encounter (Signed)
Pt's daughter notified.

## 2022-07-16 NOTE — Telephone Encounter (Signed)
No, she can continue those for now  Thanks for the heads up  Please make sure they are on her med list

## 2022-07-19 ENCOUNTER — Ambulatory Visit (INDEPENDENT_AMBULATORY_CARE_PROVIDER_SITE_OTHER): Payer: Medicare PPO

## 2022-07-19 VITALS — Ht 65.0 in | Wt 120.0 lb

## 2022-07-19 DIAGNOSIS — Z0000 Encounter for general adult medical examination without abnormal findings: Secondary | ICD-10-CM

## 2022-07-19 NOTE — Patient Instructions (Addendum)
[name] , Thank you for taking time to come for your Medicare Wellness Visit. I appreciate your ongoing commitment to your health goals. Please review the following plan we discussed and let me know if I can assist you in the future.   These are the goals we discussed:  Goals Addressed             This Visit's Progress    Follow up with Primary Care Provider   On track    Starting 05/06/2018, I will continue to take medications as prescribed and to follow up with primary care provider as directed.          This is a list of the screening recommended for you and due dates:  Health Maintenance  Topic Date Due   DTaP/Tdap/Td vaccine (3 - Td or Tdap) 10/10/2021   Zoster (Shingles) Vaccine (2 of 2) 10/17/2022*   Pneumonia Vaccine  Completed   Flu Shot  Completed   DEXA scan (bone density measurement)  Completed   COVID-19 Vaccine  Completed   HPV Vaccine  Aged Out  *Topic was postponed. The date shown is not the original due date.    Advanced directives: on file  Conditions/risks identified: none new  Next appointment: Follow up in one year for your annual wellness visit    Preventive Care 65 Years and Older, Female Preventive care refers to lifestyle choices and visits with your health care provider that can promote health and wellness. What does preventive care include? A yearly physical exam. This is also called an annual well check. Dental exams once or twice a year. Routine eye exams. Ask your health care provider how often you should have your eyes checked. Personal lifestyle choices, including: Daily care of your teeth and gums. Regular physical activity. Eating a healthy diet. Avoiding tobacco and drug use. Limiting alcohol use. Practicing safe sex. Taking low-dose aspirin every day. Taking vitamin and mineral supplements as recommended by your health care provider. What happens during an annual well check? The services and screenings done by your health  care provider during your annual well check will depend on your age, overall health, lifestyle risk factors, and family history of disease. Counseling  Your health care provider may ask you questions about your: Alcohol use. Tobacco use. Drug use. Emotional well-being. Home and relationship well-being. Sexual activity. Eating habits. History of falls. Memory and ability to understand (cognition). Work and work Statistician. Reproductive health. Screening  You may have the following tests or measurements: Height, weight, and BMI. Blood pressure. Lipid and cholesterol levels. These may be checked every 5 years, or more frequently if you are over 56 years old. Skin check. Lung cancer screening. You may have this screening every year starting at age 60 if you have a 30-pack-year history of smoking and currently smoke or have quit within the past 15 years. Fecal occult blood test (FOBT) of the stool. You may have this test every year starting at age 20. Flexible sigmoidoscopy or colonoscopy. You may have a sigmoidoscopy every 5 years or a colonoscopy every 10 years starting at age 71. Hepatitis C blood test. Hepatitis B blood test. Sexually transmitted disease (STD) testing. Diabetes screening. This is done by checking your blood sugar (glucose) after you have not eaten for a while (fasting). You may have this done every 1-3 years. Bone density scan. This is done to screen for osteoporosis. You may have this done starting at age 77. Mammogram. This may be done every  1-2 years. Talk to your health care provider about how often you should have regular mammograms. Talk with your health care provider about your test results, treatment options, and if necessary, the need for more tests. Vaccines  Your health care provider may recommend certain vaccines, such as: Influenza vaccine. This is recommended every year. Tetanus, diphtheria, and acellular pertussis (Tdap, Td) vaccine. You may need a Td  booster every 10 years. Zoster vaccine. You may need this after age 10. Pneumococcal 13-valent conjugate (PCV13) vaccine. One dose is recommended after age 50. Pneumococcal polysaccharide (PPSV23) vaccine. One dose is recommended after age 90. Talk to your health care provider about which screenings and vaccines you need and how often you need them. This information is not intended to replace advice given to you by your health care provider. Make sure you discuss any questions you have with your health care provider. Document Released: 06/17/2015 Document Revised: 02/08/2016 Document Reviewed: 03/22/2015 Elsevier Interactive Patient Education  2017 [place] Prevention in the Home Falls can cause injuries. They can happen to people of all ages. There are many things you can do to make your home safe and to help prevent falls. What can I do on the outside of my home? Regularly fix the edges of walkways and driveways and fix any cracks. Remove anything that might make you trip as you walk through a door, such as a raised step or threshold. Trim any bushes or trees on the path to your home. Use bright outdoor lighting. Clear any walking paths of anything that might make someone trip, such as rocks or tools. Regularly check to see if handrails are loose or broken. Make sure that both sides of any steps have handrails. Any raised decks and porches should have guardrails on the edges. Have any leaves, snow, or ice cleared regularly. Use sand or salt on walking paths during winter. Clean up any spills in your garage right away. This includes oil or grease spills. What can I do in the bathroom? Use night lights. Install grab bars by the toilet and in the tub and shower. Do not use towel bars as grab bars. Use non-skid mats or decals in the tub or shower. If you need to sit down in the shower, use a plastic, non-slip stool. Keep the floor dry. Clean up any water that spills on the floor  as soon as it happens. Remove soap buildup in the tub or shower regularly. Attach bath mats securely with double-sided non-slip rug tape. Do not have throw rugs and other things on the floor that can make you trip. What can I do in the bedroom? Use night lights. Make sure that you have a light by your bed that is easy to reach. Do not use any sheets or blankets that are too big for your bed. They should not hang down onto the floor. Have a firm chair that has side arms. You can use this for support while you get dressed. Do not have throw rugs and other things on the floor that can make you trip. What can I do in the kitchen? Clean up any spills right away. Avoid walking on wet floors. Keep items that you use a lot in easy-to-reach places. If you need to reach something above you, use a strong step stool that has a grab bar. Keep electrical cords out of the way. Do not use floor polish or wax that makes floors slippery. If you must use wax, use non-skid  floor wax. Do not have throw rugs and other things on the floor that can make you trip. What can I do with my stairs? Do not leave any items on the stairs. Make sure that there are handrails on both sides of the stairs and use them. Fix handrails that are broken or loose. Make sure that handrails are as long as the stairways. Check any carpeting to make sure that it is firmly attached to the stairs. Fix any carpet that is loose or worn. Avoid having throw rugs at the top or bottom of the stairs. If you do have throw rugs, attach them to the floor with carpet tape. Make sure that you have a light switch at the top of the stairs and the bottom of the stairs. If you do not have them, ask someone to add them for you. What else can I do to help prevent falls? Wear shoes that: Do not have high heels. Have rubber bottoms. Are comfortable and fit you well. Are closed at the toe. Do not wear sandals. If you use a stepladder: Make sure that it is  fully opened. Do not climb a closed stepladder. Make sure that both sides of the stepladder are locked into place. Ask someone to hold it for you, if possible. Clearly mark and make sure that you can see: Any grab bars or handrails. First and last steps. Where the edge of each step is. Use tools that help you move around (mobility aids) if they are needed. These include: Canes. Walkers. Scooters. Crutches. Turn on the lights when you go into a dark area. Replace any light bulbs as soon as they burn out. Set up your furniture so you have a clear path. Avoid moving your furniture around. If any of your floors are uneven, fix them. If there are any pets around you, be aware of where they are. Review your medicines with your doctor. Some medicines can make you feel dizzy. This can increase your chance of falling. Ask your doctor what other things that you can do to help prevent falls. This information is not intended to replace advice given to you by your health care provider. Make sure you discuss any questions you have with your health care provider. Document Released: 03/17/2009 Document Revised: 10/27/2015 Document Reviewed: 06/25/2014 Elsevier Interactive Patient Education  2017 [employer].

## 2022-07-19 NOTE — Progress Notes (Signed)
Subjective:   Tracy Morales is a 89 y.o. female who presents for Medicare Annual (Subsequent) preventive examination.  Review of Systems    No ROS.  Medicare Wellness Virtual Visit.  Visual/audio telehealth visit, UTA vital signs.   See social history for additional risk factors.   Cardiac Risk Factors include: advanced age (>69mn, >>61women);hypertension     Objective:    Today's Vitals   07/19/22 1018  Weight: 120 lb (54.4 kg)  Height: 5' 5"$  (1.651 m)   Body mass index is 19.97 kg/m.     07/19/2022   10:51 AM 07/18/2021    2:52 PM 03/18/2021    4:30 PM 03/18/2021    5:51 AM 02/14/2019    5:05 AM 05/06/2018   11:22 AM 05/02/2017   11:02 AM  Advanced Directives  Does Patient Have a Medical Advance Directive? Yes Yes No No No No No  Type of [place]of [place] will [place] will       Does patient want to make changes to medical advance directive? No - Patient declined Yes (MAU/Ambulatory/Procedural Areas - Information given)       Copy of [place]? Yes - validated most recent copy scanned in chart (See row information)        Would patient like information on creating a medical advance directive?   No - Patient declined   No - Patient declined     Current Medications (verified) Outpatient Encounter Medications as of 07/19/2022  Medication Sig   amLODipine (NORVASC) 5 MG tablet TAKE 1 TABLET BY MOUTH EVERY DAY   simvastatin (ZOCOR) 20 MG tablet TAKE 1 TABLET BY MOUTH EVERY DAY   acetaminophen (TYLENOL) 500 MG tablet Take 500 mg by mouth every 6 (six) hours as needed. OTC-UAD   aspirin 325 MG EC tablet Take 325 mg by mouth daily.   CRANBERRY PO Take 500 mg by mouth 3 (three) times daily.   famotidine (PEPCID) 20 MG tablet Take 1 tablet (20 mg total) by mouth 2 (two) times daily.   feeding supplement (ENSURE ENLIVE / ENSURE PLUS) LIQD Take 237 mLs by mouth 2 (two) times daily  between meals.   LORazepam (ATIVAN) 0.5 MG tablet Take 1 tablet (0.5 mg total) by mouth at bedtime. Caution of sedation   Meclizine HCl (BONINE PO) Take 50 mg by mouth as needed.   mirtazapine (REMERON) 30 MG tablet TAKE 1 TABLET BY MOUTH EVERYDAY AT BEDTIME   omeprazole (PRILOSEC) 20 MG capsule TAKE 1 CAPSULE BY MOUTH EVERY DAY   polyethylene glycol (MIRALAX / GLYCOLAX) 17 g packet Take 17 g by mouth 2 (two) times daily.   psyllium (METAMUCIL) 58.6 % packet Take 1 packet by mouth daily as needed.   vitamin C (ASCORBIC ACID) 500 MG tablet Take 500 mg by mouth 3 (three) times daily.   No facility-administered encounter medications on file as of 07/19/2022.    Allergies (verified) Ace inhibitors, Donepezil, and Sulfa antibiotics   History: Past Medical History:  Diagnosis Date   Allergic rhinitis, cause unspecified    Bradycardia    Cerebrovascular disease, unspecified    Chronic kidney disease, unspecified    chronic renal insufficiency   Dizziness and giddiness    DVT (deep venous thrombosis) (HCC)    R   Dyspnea    echo with normal EF. 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 basilar insufficiency  2002   zoster  5/02   Family History  Problem Relation Age of Onset   Hypertension Father    Other Father        Cardiac problems   Heart disease Father    Other Brother        blood clot   Other Sister        benign breast nodule   Social History   Socioeconomic History   Marital status: Widowed    Spouse name: Not on file   Number of children: 2   Years of education: Not on file   Highest education level: Not on file  Occupational History   Occupation: Retired  Tobacco Use   Smoking status: Never   Smokeless tobacco: Never   Tobacco comments:    non smoker   Vaping Use   Vaping Use: Never used  Substance and Sexual Activity   Alcohol use: No    Alcohol/week: 0.0 standard drinks of alcohol   Drug use: No   Sexual activity: Never  Other Topics Concern   Not on file  Social History Narrative   Widowed. 2 children. Retired, gets regular exercise.    Social Determinants of Health   Financial Resource Strain: Low Risk  (07/19/2022)   Overall Financial Resource Strain (CARDIA)    Difficulty of Paying Living Expenses: Not hard at all  Food Insecurity: No Food Insecurity (07/19/2022)   Hunger Vital Sign    Worried About Running Out of Food in the Last Year: Never true    Ran Out of Food in the Last Year: Never true  Transportation Needs: No Transportation Needs (07/19/2022)   PRAPARE - Hydrologist (Medical): No    Lack of Transportation (Non-Medical): No  Physical Activity: Inactive (07/19/2022)    Exercise Vital Sign    Days of Exercise per Week: 0 days    Minutes of Exercise per Session: 0 min  Stress: No Stress Concern Present (07/19/2022)   [place]  Feeling of Stress : Only a little  Social Connections: Socially Isolated (07/19/2022)   Social Connection and Isolation Panel [NHANES]    Frequency of Communication with Friends and Family: Twice a week    Frequency of Social Gatherings with Friends and Family: More than three times a week    Attends Religious Services: Never    Marine scientist or Organizations: No    Attends Archivist Meetings: Never    Marital Status: Widowed    Tobacco Counseling Counseling given: Not Answered Tobacco comments: non smoker    Clinical Intake:  Pre-visit preparation completed: Yes        Diabetes: No  How often do you need to have someone help you when you read instructions, pamphlets, or other written materials from your doctor or pharmacy?: [place] Needed?: No    Activities of Daily Living    07/19/2022   10:22 AM  In your present state of health, do you have any difficulty performing the following activities:  Hearing? 1  Comment Hearing aids but does not wear them often  Vision? 0  Difficulty concentrating or making decisions? 1  Comment Age appropriate  Walking or climbing stairs? 1  Comment Wheelchair in use  Dressing or bathing? 1  Doing errands, shopping? 1  Preparing Food and eating ? Y  Comment Family assist with meal prep. Self feeds.  Using the Toilet? N  In the past six months, have you accidently leaked urine? N  Comment Permanent catheter  Do you have problems with loss of bowel control? Y  Comment Wears brief  Managing your Medications? Y  Comment Family assist  Managing your Finances? Y  Comment Family assist  Housekeeping or managing your Housekeeping? Y  Comment Family assist    Patient  Care Team: Tower, Wynelle Fanny, MD as PCP - General Thelma Comp, [place] as Consulting Physician (Optometry) Murlean Iba, MD as Consulting Physician (Internal Medicine) Royston Cowper, MD as Consulting Physician (Urology) Garrel Ridgel, [place] as Consulting Physician (Podiatry) Kipp Laurence, MD as Referring Physician (Audiology)  Indicate any recent Medical Services you may have received from other than [facility] providers in the past year (date may be approximate).     Assessment:   This is a routine wellness examination for [place].  I connected with  Tracy Morales on 07/19/22 by a audio enabled telemedicine application and verified that I am speaking with the correct person using two identifiers.  Information also received from daughter, HIPAA compliant.   Patient Location: Home  Provider Location: Office/Clinic  I discussed the limitations of evaluation and management by telemedicine. The patient expressed understanding and agreed to proceed.   Hearing/Vision screen Hearing Screening - Comments::  Wears hearing aids sometimes.   Dietary issues and exercise activities discussed: Current Exercise Habits: The patient does not participate in regular exercise at present Regular. Appetite fair to poor. Ensure provided.  Liquid intake poor.    Goals Addressed             This Visit's Progress    Follow up with Primary Care Provider   On track    Starting 05/06/2018, I will continue to take medications as prescribed and to follow up with primary care provider as directed.        Depression Screen    07/19/2022   10:47 AM 11/10/2021   12:14 PM 07/18/2021    2:56 PM 05/20/2020   10:04 AM  05/15/2019   10:15 AM 05/06/2018   10:58 AM 05/02/2017   11:00 AM  PHQ 2/9 Scores  PHQ - 2 Score 0 0 2 0 0 1 3  PHQ- 9 Score   3   1 15    $ Fall Risk    07/19/2022   10:51 AM 11/10/2021   12:13 PM 07/18/2021    2:53 PM 05/20/2020   10:04 AM 05/15/2019   10:15 AM  Fall Risk   Falls in  the past year? 0 0 1 0 0  Number falls in past yr: 0  0  0  Injury with Fall? 0  0    Risk for fall due to :   Impaired mobility    Follow up Falls evaluation completed;Falls prevention discussed Falls evaluation completed Falls prevention discussed Falls evaluation completed Falls evaluation completed    FALL RISK PREVENTION PERTAINING TO THE HOME: Home free of loose throw rugs in walkways, pet beds, electrical cords, etc? Yes  Adequate lighting in your home to reduce risk of falls? Yes   ASSISTIVE DEVICES UTILIZED TO PREVENT FALLS: Life alert? No  Use of a cane, walker or w/c? Yes , wheelchair bound.   TIMED UP AND GO: Was the test performed? No .   Cognitive Function:    05/06/2018   11:23 AM 05/02/2017   11:00 AM 04/19/2016    2:40 PM  MMSE - Mini Mental State Exam  Orientation to time 5 5 5  $ Orientation to Place 5 5 5  $ Registration 3 3 3  $ Attention/ Calculation 0 0 0  Recall 1 1 3  $ Recall-comments unable to recall 2 of 3 words; was able to recall after cues given unable  to recall 2 of 3 words   Language- name 2 objects 0 0 0  Language- repeat 1 1 1  $ Language- follow 3 step command 3 3 3  $ Language- read & follow direction 0 0 0  Write a sentence 0 0 0  Copy design 0 0 0  Total score 18 18 20        $ 07/19/2022   10:54 AM  6CIT Screen  What Year? 0 points  What month? 0 points  What time? 0 points    Immunizations Immunization History  Administered Date(s) Administered   COVID-19, mRNA, vaccine(Comirnaty)12 years and older 03/15/2022   Fluad Quad(high Dose 65+) 02/20/2019, 02/17/2020, 02/17/2021, 02/27/2022   Influenza Split 03/08/2011, 02/25/2012   Influenza Whole 05/05/2004, 02/27/2008, 03/03/2009, 02/24/2010   Influenza,inj,Quad PF,6+ Mos 02/25/2013, 02/01/2014, 03/10/2015, 03/05/2016, 02/19/2017, 02/28/2018   PFIZER Comirnaty(Gray Top)Covid-19 Tri-Sucrose Vaccine 09/13/2020   PFIZER(Purple Top)SARS-COV-2 Vaccination 07/19/2019, 08/18/2019, 04/07/2020    PPD Test 06/09/2014   Pfizer Covid-19 Vaccine Bivalent Booster 51yr & up 03/09/2021   Pneumococcal Conjugate-13 10/30/2013   Pneumococcal Polysaccharide-23 05/06/2002   Td 05/06/2002   Tdap 10/11/2011   Zoster Recombinat (Shingrix) 06/24/2018   TDAP status: Due, Education has been provided regarding the importance of this vaccine. Advised may receive this vaccine at local pharmacy or Health Dept. Aware to provide a copy of the vaccination record if obtained from local pharmacy or Health Dept. Verbalized acceptance and understanding.  Shingles vaccine- 1st dose completed. Agrees to update immunization record if/when second dose completed.  Screening Tests Health Maintenance  Topic Date Due   DTaP/Tdap/Td (3 - Td or Tdap) 10/10/2021   Zoster Vaccines- Shingrix (2 of 2) 10/17/2022 (Originally 08/19/2018)   Pneumonia Vaccine 89 Years old  Completed   INFLUENZA VACCINE  Completed  DEXA SCAN  Completed   COVID-19 Vaccine  Completed   HPV VACCINES  Aged Out    Health Maintenance Health Maintenance Due  Topic Date Due   DTaP/Tdap/Td (3 - Td or Tdap) 10/10/2021   Lung Cancer Screening: (Low Dose CT Chest recommended if Age 31-80 years, 30 pack-year currently smoking OR have quit w/in 15years.) does not qualify.   Hepatitis C Screening: does not qualify.  Vision Screening: Recommended annual ophthalmology exams for early detection of glaucoma and other disorders of the eye.  Dental Screening: Recommended annual dental exams for proper oral hygiene  Community Resource Referral / Chronic Care Management: CRR required this visit?  No   CCM required this visit?  No      Plan:     I have personally reviewed and noted the following in the patient's chart:   Medical and social history Use of alcohol, tobacco or illicit drugs  Current medications and supplements including opioid prescriptions. Patient is not currently taking opioid prescriptions. Functional ability and  status Nutritional status Physical activity Advanced directives List of other physicians Hospitalizations, surgeries, and ER visits in previous 12 months Vitals Screenings to include cognitive, depression, and falls Referrals and appointments  In addition, I have reviewed and discussed with patient certain preventive protocols, quality metrics, and best practice recommendations. A written personalized care plan for preventive services as well as general preventive health recommendations were provided to patient.     Leta Jungling, LPN   [phone]

## 2022-07-20 ENCOUNTER — Encounter: Payer: Self-pay | Admitting: Family Medicine

## 2022-07-20 ENCOUNTER — Ambulatory Visit (INDEPENDENT_AMBULATORY_CARE_PROVIDER_SITE_OTHER): Payer: Medicare PPO | Admitting: Family Medicine

## 2022-07-20 VITALS — BP 135/75 | HR 108 | Temp 98.6°F

## 2022-07-20 DIAGNOSIS — K219 Gastro-esophageal reflux disease without esophagitis: Secondary | ICD-10-CM

## 2022-07-20 DIAGNOSIS — E7800 Pure hypercholesterolemia, unspecified: Secondary | ICD-10-CM

## 2022-07-20 DIAGNOSIS — E440 Moderate protein-calorie malnutrition: Secondary | ICD-10-CM

## 2022-07-20 DIAGNOSIS — R7309 Other abnormal glucose: Secondary | ICD-10-CM | POA: Diagnosis not present

## 2022-07-20 DIAGNOSIS — L89309 Pressure ulcer of unspecified buttock, unspecified stage: Secondary | ICD-10-CM

## 2022-07-20 DIAGNOSIS — N1832 Chronic kidney disease, stage 3b: Secondary | ICD-10-CM | POA: Diagnosis not present

## 2022-07-20 DIAGNOSIS — Z0000 Encounter for general adult medical examination without abnormal findings: Secondary | ICD-10-CM

## 2022-07-20 DIAGNOSIS — D500 Iron deficiency anemia secondary to blood loss (chronic): Secondary | ICD-10-CM

## 2022-07-20 DIAGNOSIS — I739 Peripheral vascular disease, unspecified: Secondary | ICD-10-CM

## 2022-07-20 DIAGNOSIS — F03B18 Unspecified dementia, moderate, with other behavioral disturbance: Secondary | ICD-10-CM

## 2022-07-20 DIAGNOSIS — I10 Essential (primary) hypertension: Secondary | ICD-10-CM | POA: Diagnosis not present

## 2022-07-20 DIAGNOSIS — R54 Age-related physical debility: Secondary | ICD-10-CM

## 2022-07-20 NOTE — Patient Instructions (Addendum)
Get a donut pillow /pad to help off load the bedsore  Call center well to see if they have something to put on it   Barrier cream like A and D or desitin is helpful   Call [facility] to get your second shingrix vaccine if you did not get it   Talk to the palliative care folks about needs at home with care   I may look into a social work consult also   Keep using the wedge for bed  Take a look at the handout for foods for acid reflux  You can try an extra pepcid pill at night  (take it twice daily)   Keep encouraging fluids Whatever you like !   Hold the iron  Hold the vitamin D

## 2022-07-20 NOTE — Progress Notes (Unsigned)
Subjective:    Patient ID: Tracy Morales, female    DOB: 1920-01-08, 89 y.o.   MRN: [id number]  HPI Here for health maintenance exam and to review chronic medical problems    Wt Readings from Last 3 Encounters:  07/19/22 120 lb (54.4 kg)  05/09/22 120 lb (54.4 kg)  11/10/21 123 lb (55.8 kg)   Vitals:   07/20/22 1017  BP: (!) 160/70  Pulse: (!) 108  Temp: 98.6 F (37 C)  SpO2: 95%   H/o malnutrition  Takes ensure    Palliative care pt due to adv age Is DNR  Is feeling ok in general   Sleeping a lot during the day and up at night  Does not want to get up in the day time then  Sits or is in bed all day  Deals with bed sores - has one now   [facility]ospital bed (dislikes it) about to change to different bed   Does not want to wear her hearing aide    Immunization History  Administered Date(s) Administered   COVID-19, mRNA, vaccine(Comirnaty)12 years and older 03/15/2022   Fluad Quad(high Dose 65+) 02/20/2019, 02/17/2020, 02/17/2021, 02/27/2022   Influenza Split 03/08/2011, 02/25/2012   Influenza Whole 05/05/2004, 02/27/2008, 03/03/2009, 02/24/2010   Influenza,inj,Quad PF,6+ Mos 02/25/2013, 02/01/2014, 03/10/2015, 03/05/2016, 02/19/2017, 02/28/2018   PFIZER Comirnaty(Gray Top)Covid-19 Tri-Sucrose Vaccine 09/13/2020   PFIZER(Purple Top)SARS-COV-2 Vaccination 07/19/2019, 08/18/2019, 04/07/2020   PPD Test 06/09/2014   Pfizer Covid-19 Vaccine Bivalent Booster 44yr & up 03/09/2021   Pneumococcal Conjugate-13 10/30/2013   Pneumococcal Polysaccharide-23 05/06/2002   Td 05/06/2002   Tdap 10/11/2011   Zoster Recombinat (Shingrix) 06/24/2018   Health Maintenance Due  Topic Date Due   DTaP/Tdap/Td (3 - Td or Tdap) 10/10/2021   Shingrix-too difficult to get her out to pharmacy to get this   Tetanus shot 2013  Dexa   2001 - declines/ is not up to going through that  Falls- none new , last one was at least 2-3 mo ago   FFijinew (has had in the past) Supplements  Vit D level high at 104.4  Exercise -none   Home health tried to get her on medicaid - she did not qualify  Would appreciate any help at home she could get   Palliative care comes out   Dislikes fluids as a rule Has water /juice and ensure  Caregivers try to get her to drink   Still coughs some at night -? Due to reflux  Using a wedge on the bed   Out aged breast and colon cancer screening   Recent amw Memory screen- had some trouble with recent recall  Has dx of dementia/ age related   Mood Takes remeron and bedtime lorazepam for sleep and mood   HTN - with hx of carotid stenosis, DD and cerebrovascular disease  bp is stable today  No cp or palpitations or headaches or edema  No side effects to medicines  Takes asa 325 mg daily  BP Readings from Last 3 Encounters:  07/20/22 135/75  06/28/22 132/70  05/09/22 (!) 180/83    Amlodipine 5 mg daily   Dislikes fluids as a rule    CKD 3 Sees nephrology Dr SCandiss Norse(said nothing more to do)  Lab Results  Component Value Date   CREATININE 1.43 (H) 07/13/2022   BUN 18 07/13/2022   NA 137 07/13/2022   K 4.4 07/13/2022   CL 99  07/13/2022   CO2 29 07/13/2022   GFR 30.03 Down from 32  Urinary retention Has to cath [facility] urology    Glucose Lab Results  Component Value Date   HGBA1C 5.8 07/13/2022   Hyperlipidemia Lab Results  Component Value Date   CHOL 167 07/13/2022   CHOL 174 07/19/2021   CHOL 174 05/18/2020   Lab Results  Component Value Date   HDL 35.50 (L) 07/13/2022   HDL 44.80 07/19/2021   HDL 37.70 (L) 05/18/2020   Lab Results  Component Value Date   LDLCALC 114 (H) 07/13/2022   LDLCALC 116 (H) 07/19/2021   LDLCALC 123 (H) 05/18/2020   Lab Results  Component Value Date   TRIG 90.0 07/13/2022   TRIG 66.0 07/19/2021   TRIG 67.0 05/18/2020   Lab Results  Component Value Date   CHOLHDL 5 07/13/2022   CHOLHDL 4 07/19/2021   CHOLHDL  5 05/18/2020   No results found for: "LDLDIRECT" Taking zocor 20 mg daily   Iron def anemia Lab Results  Component Value Date   WBC 4.9 07/13/2022   HGB 13.3 07/13/2022   HCT 40.0 07/13/2022   MCV 89.4 07/13/2022   PLT 280.0 07/13/2022   Lab Results  Component Value Date   IRON 71 07/13/2022   FERRITIN 418.4 (H) 07/13/2022   Was taking iron   Lab Results  Component Value Date   HGBA1C 5.8 07/13/2022   Patient Active Problem List   Diagnosis Date Noted   GERD (gastroesophageal reflux disease) 07/20/2022   Advanced age 55/31/2023   Heartburn 08/16/2021   Dysthymia 07/19/2021   Hematoma 04/19/2021   Urinary retention 04/11/2021   Iron deficiency anemia 04/11/2021   Malnutrition of moderate degree 03/20/2021   ABLA 03/19/2021   DNR (do not resuscitate) 03/18/2021   Closed displaced transverse fracture of shaft of right femur ([place])    Pincer nail deformity 07/04/2020   Seborrheic dermatitis of scalp 08/27/2019   Elevated random blood glucose level 05/11/2019   Pain due to onychomycosis of toenails of both feet 12/04/2018   Recurrent UTI 09/22/2018   Low back pain 09/15/2018   Dementia ([place]) 05/11/2018   Mobility impaired 05/06/2017   Osteoarthritis of both hips 04/15/2015   Routine general medical examination at a health care facility 04/12/2015   Hair loss 11/19/2014   Arthritis of right shoulder region 06/09/2014   Nodule of left lung 06/09/2014   Encounter for Medicare annual wellness exam 10/30/2013   H/O fall 04/27/2013   Risk for falls 04/11/2012   MICROSCOPIC HEMATURIA 08/29/2009   CONDUCTIVE HEARING LOSS BILATERAL 08/08/2009   EDEMA 04/07/2009   BRADYCARDIA 10/26/2008   DYSPNEA 10/26/2008   INCONTINENCE, URGE 02/27/2008   Hyperlipidemia 10/11/2006   PERIPHERAL NEUROPATHY, LOWER EXTREMITIES, BILATERAL 10/11/2006   Essential hypertension 123XX123   DIASTOLIC DYSFUNCTION 123XX123   CAROTID STENOSIS 10/11/2006   CEREBROVASCULAR DISEASE 10/11/2006    PERIPHERAL VASCULAR DISEASE 10/11/2006   ALLERGIC RHINITIS 10/11/2006   CKD stage IIIb ([place]) 10/11/2006   OVERACTIVE BLADDER 10/11/2006   OSTEOARTHRITIS 10/11/2006   CARDIAC MURMUR, HX OF 10/11/2006   Past Medical History:  Diagnosis Date   Allergic rhinitis, cause unspecified    Bradycardia    Cerebrovascular disease, unspecified    Chronic kidney disease, unspecified    chronic renal insufficiency   Dizziness and giddiness    DVT (deep venous thrombosis) (HCC)    R   Dyspnea    echo with normal EF. +diastolic dysfunction   Failure to thrive  in childhood    gariatric   HTN (hypertension)    Hyperlipidemia    Hypertonicity of bladder    hx   Mild depression    Mononeuritis of lower limb, unspecified    peripheral neuropathy, bilateral   Occlusion and stenosis of carotid artery without mention of cerebral infarction 09/03/2007   u/s L 40-59%, R 0-39%; stable for many years   Osteoarthrosis, unspecified whether generalized or localized, unspecified site    Peripheral vascular disease, unspecified ([place])    Polymyalgia rheumatica ([place])    hx   Past Surgical History:  Procedure Laterality Date   2D echo  8/06   mild aortic stenosis.    arterial doppler     carotid doppler  4/02   bilateral plaque    carotid doppler  8/06   non significant stenosis   carotid doppler     R 40%; L 40-60%   CATARACT EXTRACTION     CHOLECYSTECTOMY  2000   COLONOSCOPY  1/04   polyps   CT SCAN  05/25/08   abd and pelvis- inflated urinary bladder and large amt stool throughout w/o blockage    dexa  2/01   osteopenia   echo     mild LVH EF 50% As   FEMUR IM NAIL Right 03/18/2021   Procedure: INTRAMEDULLARY (IM) NAIL FEMORAL;  Surgeon: Corky Mull, MD;  Location: [facility] ORS;  Service: Orthopedics;  Laterality: Right;   triger finger contracture  6/04   vert. basilar insufficiency  2002   zoster  5/02   Social History   Tobacco Use   Smoking status: Never   Smokeless tobacco: Never    Tobacco comments:    non smoker   Vaping Use   Vaping Use: Never used  Substance Use Topics   Alcohol use: No    Alcohol/week: 0.0 standard drinks of alcohol   Drug use: No   Family History  Problem Relation Age of Onset   Hypertension Father    Other Father        Cardiac problems   Heart disease Father    Other Brother        blood clot   Other Sister        benign breast nodule   Allergies  Allergen Reactions   Ace Inhibitors     REACTION: cough   Donepezil Diarrhea and Other (See Comments)    GI problems Other reaction(s): Other (See Comments) GI problems   Sulfa Antibiotics Nausea Only    headache   Current Outpatient Medications on File Prior to Visit  Medication Sig Dispense Refill   acetaminophen (TYLENOL) 500 MG tablet Take 500 mg by mouth every 6 (six) hours as needed. OTC-UAD     amLODipine (NORVASC) 5 MG tablet TAKE 1 TABLET BY MOUTH EVERY DAY 90 tablet 1   aspirin 325 MG EC tablet Take 325 mg by mouth daily.     CRANBERRY PO Take 500 mg by mouth 3 (three) times daily.     famotidine (PEPCID) 20 MG tablet Take 1 tablet (20 mg total) by mouth 2 (two) times daily. 180 tablet 3   feeding supplement (ENSURE ENLIVE / ENSURE PLUS) LIQD Take 237 mLs by mouth 2 (two) times daily between meals. 237 mL 12   LORazepam (ATIVAN) 0.5 MG tablet Take 1 tablet (0.5 mg total) by mouth at bedtime. Caution of sedation 30 tablet 1   Meclizine HCl (BONINE PO) Take 50 mg by mouth as needed.  mirtazapine (REMERON) 30 MG tablet TAKE 1 TABLET BY MOUTH EVERYDAY AT BEDTIME 90 tablet 3   omeprazole (PRILOSEC) 20 MG capsule TAKE 1 CAPSULE BY MOUTH EVERY DAY 90 capsule 1   polyethylene glycol (MIRALAX / GLYCOLAX) 17 g packet Take 17 g by mouth 2 (two) times daily. 14 each 0   psyllium (METAMUCIL) 58.6 % packet Take 1 packet by mouth daily as needed.     simvastatin (ZOCOR) 20 MG tablet TAKE 1 TABLET BY MOUTH EVERY DAY 90 tablet 1   vitamin C (ASCORBIC ACID) 500 MG tablet Take 500 mg  by mouth 3 (three) times daily.     No current facility-administered medications on file prior to visit.    Review of Systems  Constitutional:  Positive for activity change, appetite change and fatigue. Negative for fever and unexpected weight change.  HENT:  Negative for congestion, ear pain, rhinorrhea, sinus pressure and sore throat.   Eyes:  Negative for pain, redness and visual disturbance.  Respiratory:  Positive for cough. Negative for shortness of breath and wheezing.   Cardiovascular:  Negative for chest pain and palpitations.  Gastrointestinal:  Negative for abdominal pain, blood in stool, constipation and diarrhea.       Some heartburn  Endocrine: Negative for polydipsia and polyuria.  Genitourinary:  Negative for dysuria, frequency and urgency.  Musculoskeletal:  Positive for arthralgias, back pain, gait problem and myalgias.  Skin:  Negative for pallor and rash.  Allergic/Immunologic: Negative for environmental allergies.  Neurological:  Negative for dizziness, syncope and headaches.       Poor balance  Hematological:  Negative for adenopathy. Does not bruise/bleed easily.  Psychiatric/Behavioral:  Positive for confusion, decreased concentration and sleep disturbance. Negative for dysphoric mood. The patient is nervous/anxious.        Objective:   Physical Exam Constitutional:      General: She is not in acute distress.    Appearance: Normal appearance. She is well-developed. She is not ill-appearing or diaphoretic.     Comments: Underweight  Frail appearing In wheelchair   HENT:     Head: Normocephalic and atraumatic.     Right Ear: Tympanic membrane, ear canal and external ear normal.     Left Ear: Tympanic membrane, ear canal and external ear normal.     Nose: Nose normal. No congestion.     Mouth/Throat:     Mouth: Mucous membranes are moist.     Pharynx: Oropharynx is clear. No posterior oropharyngeal erythema.  Eyes:     General: No scleral icterus.     Extraocular Movements: Extraocular movements intact.     Conjunctiva/sclera: Conjunctivae normal.     Pupils: Pupils are equal, round, and reactive to light.  Neck:     Thyroid: No thyromegaly.     Vascular: No carotid bruit or JVD.  Cardiovascular:     Rate and Rhythm: Normal rate and regular rhythm.     Pulses: Normal pulses.     Heart sounds: Normal heart sounds.     No gallop.     Comments: Unable to palpate pedal pulses  Feet are warm Pulmonary:     Effort: Pulmonary effort is normal. No respiratory distress.     Breath sounds: Normal breath sounds. No wheezing or rales.     Comments: Good air exch Chest:     Chest wall: No tenderness.  Abdominal:     General: Bowel sounds are normal. There is no distension or abdominal bruit.  Palpations: Abdomen is soft. There is no mass.     Tenderness: There is no abdominal tenderness.     Hernia: No hernia is present.  Musculoskeletal:        General: No tenderness. Normal range of motion.     Cervical back: Normal range of motion and neck supple. No rigidity. No muscular tenderness.     Right lower leg: No edema.     Left lower leg: No edema.     Comments: No kyphosis   Lymphadenopathy:     Cervical: No cervical adenopathy.  Skin:    General: Skin is warm and dry.     Coloration: Skin is not pale.     Findings: No erythema or rash.     Comments: Trace pedal edema   Feet seem well perfused     Neurological:     Mental Status: She is alert. Mental status is at baseline.     Cranial Nerves: No cranial nerve deficit.     Motor: No abnormal muscle tone.     Coordination: Coordination normal.     Gait: Gait normal.     Deep Tendon Reflexes: Reflexes are normal and symmetric. Reflexes normal.     Comments: Wheelchair bound Generalized weakness  Poor balance Unable to ambulate or stand today    Psychiatric:        Mood and Affect: Mood normal.        Speech: Speech normal.        Cognition and Memory: Cognition is  impaired. Memory is impaired.     Comments: Calm today  Answers some questions Not agitated today  Pleasant  Quiet            Assessment & Plan:   Problem List Items Addressed This Visit       Cardiovascular and Mediastinum   Essential hypertension    bp in fair control at this time  BP Readings from Last 1 Encounters:  07/20/22 135/75  No changes needed Most recent labs reviewed  Disc lifstyle change with low sodium diet and exercise  Labs ordered Plan to continue amlodipine 5 mg daily        PERIPHERAL VASCULAR DISEASE    Feet feel warm but pulses are difficult to palpate Pt denies pain or claudication symptoms  Is not very active         Digestive   GERD (gastroesophageal reflux disease)    Plan to continue pepcid Also elevate head of bed best she can        Nervous and Auditory   Dementia ([place])    Due to advanced age  Under care of palliative services  Progressive  Does not sleep well No recent agitation  Has had hallucinations in the past         Genitourinary   CKD stage IIIb ([place])    At advanced age of 89 GFR 30.03 Under palliative care, nob being aggressive  Does cath due to urinary retention and sees urology Caregivers make effort to enc fluids/ this is difficult        Other   Advanced age    Age 27  Chronic pain and not mobile  Goal to preserve as much quality of life as possible Under palliative care       Bed sore    Pt is prone to sacral bed sores due to lack of motility  Recommend use of barrier cream prn  Consider a donut pad for sitting Change  positions frequently   Will continue to monitor       Elevated random blood glucose level    Lab Results  Component Value Date   HGBA1C 5.8 07/13/2022  Lower appetite at advanced age Does use ensure for protein       Hyperlipidemia    Disc goals for lipids and reasons to control them Rev last labs with pt Rev low sat fat diet in detail Labs ordered  She tolerates  simvastatin 20 mg daily  LDL of 114-stable Lower HDL due to less activity      Iron deficiency anemia    Stable cbc without iron  Takes ensue which is fortified       Malnutrition of moderate degree    Advanced age of 33  Low appetite  Taking ensure      Routine general medical examination at a health care facility - Primary    Reviewed health habits including diet and exercise and skin cancer prevention Reviewed appropriate screening tests for age  Also reviewed health mt list, fam hx and immunization status , as well as social and family history   See HPI Labs reviewed  Advanced age of 13- no longer screening  Unable to tolerate a dexa test  Under palliative care-goal is quality of life Not mobile  Unable to get to pharmacy for shingrix or tetanus shot recently

## 2022-07-22 NOTE — Assessment & Plan Note (Signed)
Lab Results  Component Value Date   HGBA1C 5.8 07/13/2022   Lower appetite at advanced age Does use ensure for protein

## 2022-07-22 NOTE — Assessment & Plan Note (Signed)
At advanced age of 51 GFR 30.03 Under palliative care, nob being aggressive  Does cath due to urinary retention and sees urology Caregivers make effort to enc fluids/ this is difficult

## 2022-07-22 NOTE — Assessment & Plan Note (Signed)
Pt is prone to sacral bed sores due to lack of motility  Recommend use of barrier cream prn  Consider a donut pad for sitting Change positions frequently   Will continue to monitor

## 2022-07-22 NOTE — Assessment & Plan Note (Signed)
bp in fair control at this time  BP Readings from Last 1 Encounters:  07/20/22 135/75   No changes needed Most recent labs reviewed  Disc lifstyle change with low sodium diet and exercise  Labs ordered Plan to continue amlodipine 5 mg daily

## 2022-07-22 NOTE — Assessment & Plan Note (Signed)
Disc goals for lipids and reasons to control them Rev last labs with pt Rev low sat fat diet in detail Labs ordered  She tolerates simvastatin 20 mg daily  LDL of 114-stable Lower HDL due to less activity

## 2022-07-22 NOTE — Assessment & Plan Note (Addendum)
Reviewed health habits including diet and exercise and skin cancer prevention Reviewed appropriate screening tests for age  Also reviewed health mt list, fam hx and immunization status , as well as social and family history   See HPI Labs reviewed  Advanced age of 73- no longer screening  Unable to tolerate a dexa test  Under palliative care-goal is quality of life Not mobile  Unable to get to pharmacy for shingrix or tetanus shot recently

## 2022-07-22 NOTE — Assessment & Plan Note (Signed)
Feet feel warm but pulses are difficult to palpate Pt denies pain or claudication symptoms  Is not very active

## 2022-07-22 NOTE — Assessment & Plan Note (Signed)
Age 89  Chronic pain and not mobile  Goal to preserve as much quality of life as possible Under palliative care

## 2022-07-22 NOTE — Assessment & Plan Note (Signed)
Stable cbc without iron  Takes ensue which is fortified

## 2022-07-22 NOTE — Assessment & Plan Note (Signed)
Due to advanced age  Under care of palliative services  Progressive  Does not sleep well No recent agitation  Has had hallucinations in the past

## 2022-07-22 NOTE — Assessment & Plan Note (Signed)
Plan to continue pepcid Also elevate head of bed best she can

## 2022-07-22 NOTE — Assessment & Plan Note (Signed)
Advanced age of 76  Low appetite  Taking ensure

## 2022-07-24 ENCOUNTER — Telehealth: Payer: Self-pay | Admitting: *Deleted

## 2022-07-24 NOTE — Patient Instructions (Signed)
Visit Information  Thank you for taking time to visit with me today. Please don't hesitate to contact me if I can be of assistance to you.   Following are the goals we discussed today:   Goals Addressed             This Visit's Progress    Community resource needs       Care Coordination Interventions: Patient currently followed by Palliative Care Applying for Aid [facility] Att[facility]) discussed-contact information provided [phone]-patient's daughter will provide contact information to her son who can follow up Private duty care discussed including anticipated cost Daughter to continue to utilize [employer] [facility] [place] for incontinent supplies Caregiver stress acknowledged -self care emphasized           Please call the care guide team at [phone] if you need to cancel or reschedule your appointment.   If you are experiencing a Mental Health or [place] or need someone to talk to, please call 911   The patient verbalized understanding of instructions, educational materials, and care plan provided today and DECLINED offer to receive copy of patient instructions, educational materials, and care plan.   No further follow up required: patient's daughter to contact this Education officer, museum with any additional community resource needs  [employer], [place] Worker  [facility] Care Management [phone]

## 2022-07-24 NOTE — Patient Outreach (Signed)
  Care Coordination   Initial Visit Note   07/24/2022 Name: Tracy Morales MRN: [id number] DOB: 1919-11-29  Tracy Morales is a 89 y.o. year old female who sees Tower, Tracy Fanny, MD for primary care. I spoke with  Tracy Morales by phone today.  What matters to the patients health and wellness today?  Community Resources    Goals Addressed             This Visit's Progress    Community resource needs       Care Coordination Interventions: Patient currently followed by Palliative Care Applying for Aid and Attendance [facility](in [facility] care) discussed-contact information provided [phone]-patient's daughter will provide contact information to her son who can follow up Private duty care discussed including anticipated cost Daughter to continue to utilize [employer] [facility] [employer] for incontinent supplies Caregiver stress acknowledged -self care emphasized          SDOH assessments and interventions completed:  Yes  SDOH Interventions Today    Flowsheet Row Most Recent Value  SDOH Interventions   Food Insecurity Interventions Intervention Not Indicated  Housing Interventions Intervention Not Indicated  Transportation Interventions Intervention Not Indicated        Care Coordination Interventions:  Yes, provided  Interventions Today    Flowsheet Row Most Recent Value  Chronic Disease   Chronic disease during today's visit Hypertension (HTN), Other  [Dementia]  General Interventions   General Interventions Discussed/Reviewed General Interventions Discussed, [employer], Level of Care  Level of Care Applications  Applications Medicaid  [Medicaid application completed previously, patient does not qualify]  Education Interventions   Applications Medicaid  [Medicaid application completed previously, patient does not qualify]       Follow up plan: No further intervention required.   Encounter Outcome:  Pt. Visit Completed

## 2022-08-20 ENCOUNTER — Other Ambulatory Visit: Payer: Medicare PPO

## 2022-08-20 VITALS — BP 140/80 | HR 76 | Temp 97.4°F

## 2022-08-20 DIAGNOSIS — Z515 Encounter for palliative care: Secondary | ICD-10-CM

## 2022-08-20 NOTE — Progress Notes (Signed)
PATIENT NAME: CLAIRENE DEPAOLI DOB: 1920-01-08 MRN: [id number]  PRIMARY CARE PROVIDER: Abner Greenspan, MD  RESPONSIBLE PARTY:  Acct ID - Guarantor Home Phone Work Phone Relationship Acct Type  [id number] SALAYA, [phone]  Self P/F     [address], [place], [place] [address]   Home visit completed with patient and daughter.  Foley Cath:  [facility] currently managing foley cath on monthly basis.  Daughter denies any s/s of UTI.  Clear yellow urine present to foley bag.   Pain: Noted right foot great toe.  No open areas present. Patient will follow up with podiatry this week.   Reflux:  Improvement with Prilosec and Pepcid.  Patient continues to drink liquids laying down in the bed.  Encouraged use of wedge but patient is refusing.  Skin breakdown: Right buttock with Stage 2.  Using barrier cream and pads to area.  Daughter reports this is improving.   Daughter request follow up visit every 3 months given patient's stability.  She is aware to call should a decline occur.   CODE STATUS: DNR ADVANCED DIRECTIVES: Yes MOST FORM: No PPS: 30%   PHYSICAL EXAM:   VITALS: Today's Vitals   08/20/22 1101  BP: (!) 140/80  Pulse: 76  Temp: (!) 97.4 F (36.3 C)  SpO2: 95%    LUNGS: clear to auscultation  CARDIAC: Cor RRR}  EXTREMITIES: - for edema SKIN:  See above   NEURO: positive for gait problems and memory problems       Lorenza Burton, RN

## 2022-08-22 ENCOUNTER — Encounter: Payer: Self-pay | Admitting: Podiatry

## 2022-08-22 ENCOUNTER — Ambulatory Visit: Payer: Medicare PPO | Admitting: Podiatry

## 2022-08-22 DIAGNOSIS — E1149 Type 2 diabetes mellitus with other diabetic neurological complication: Secondary | ICD-10-CM | POA: Diagnosis not present

## 2022-08-22 DIAGNOSIS — M79674 Pain in right toe(s): Secondary | ICD-10-CM | POA: Diagnosis not present

## 2022-08-22 DIAGNOSIS — M79675 Pain in left toe(s): Secondary | ICD-10-CM | POA: Diagnosis not present

## 2022-08-22 DIAGNOSIS — B351 Tinea unguium: Secondary | ICD-10-CM

## 2022-08-22 NOTE — Progress Notes (Signed)
She presents today chief concern painful thick elongated toenails.  Objective: Vital signs stable alert oriented x 3.  Pulses are palpable.  Hammertoe deformities are noted bilateral rigid in nature no open lesions or wounds.  Toenails are long thick yellow dystrophic onychomycotic there is no interdigital wetness or breakdown bilaterally.  Assessment: Pain in limb secondary to onychomycosis.  Plan: Debridement of toenails 1 through 5 bilateral.  Debridement of all benign skin lesions bilateral.

## 2022-08-24 DIAGNOSIS — M160 Bilateral primary osteoarthritis of hip: Secondary | ICD-10-CM

## 2022-08-24 DIAGNOSIS — J309 Allergic rhinitis, unspecified: Secondary | ICD-10-CM

## 2022-08-24 DIAGNOSIS — E440 Moderate protein-calorie malnutrition: Secondary | ICD-10-CM

## 2022-08-24 DIAGNOSIS — M353 Polymyalgia rheumatica: Secondary | ICD-10-CM

## 2022-08-24 DIAGNOSIS — G629 Polyneuropathy, unspecified: Secondary | ICD-10-CM

## 2022-08-24 DIAGNOSIS — I739 Peripheral vascular disease, unspecified: Secondary | ICD-10-CM

## 2022-08-24 DIAGNOSIS — N1832 Chronic kidney disease, stage 3b: Secondary | ICD-10-CM | POA: Diagnosis not present

## 2022-08-24 DIAGNOSIS — F32A Depression, unspecified: Secondary | ICD-10-CM

## 2022-08-24 DIAGNOSIS — R32 Unspecified urinary incontinence: Secondary | ICD-10-CM

## 2022-08-24 DIAGNOSIS — Z8673 Personal history of transient ischemic attack (TIA), and cerebral infarction without residual deficits: Secondary | ICD-10-CM

## 2022-08-24 DIAGNOSIS — M19011 Primary osteoarthritis, right shoulder: Secondary | ICD-10-CM

## 2022-08-24 DIAGNOSIS — R1310 Dysphagia, unspecified: Secondary | ICD-10-CM

## 2022-08-24 DIAGNOSIS — Z9181 History of falling: Secondary | ICD-10-CM

## 2022-08-24 DIAGNOSIS — D631 Anemia in chronic kidney disease: Secondary | ICD-10-CM | POA: Diagnosis not present

## 2022-08-24 DIAGNOSIS — L89312 Pressure ulcer of right buttock, stage 2: Secondary | ICD-10-CM | POA: Diagnosis not present

## 2022-08-24 DIAGNOSIS — E785 Hyperlipidemia, unspecified: Secondary | ICD-10-CM

## 2022-08-24 DIAGNOSIS — I1310 Hypertensive heart and chronic kidney disease without heart failure, with stage 1 through stage 4 chronic kidney disease, or unspecified chronic kidney disease: Secondary | ICD-10-CM | POA: Diagnosis not present

## 2022-08-24 DIAGNOSIS — H900 Conductive hearing loss, bilateral: Secondary | ICD-10-CM

## 2022-08-24 DIAGNOSIS — D509 Iron deficiency anemia, unspecified: Secondary | ICD-10-CM

## 2022-08-24 DIAGNOSIS — N318 Other neuromuscular dysfunction of bladder: Secondary | ICD-10-CM | POA: Diagnosis not present

## 2022-08-24 DIAGNOSIS — Z466 Encounter for fitting and adjustment of urinary device: Secondary | ICD-10-CM | POA: Diagnosis not present

## 2022-08-24 DIAGNOSIS — Z86718 Personal history of other venous thrombosis and embolism: Secondary | ICD-10-CM

## 2022-08-24 DIAGNOSIS — F341 Dysthymic disorder: Secondary | ICD-10-CM

## 2022-08-24 DIAGNOSIS — I6529 Occlusion and stenosis of unspecified carotid artery: Secondary | ICD-10-CM

## 2022-08-24 DIAGNOSIS — Z8616 Personal history of COVID-19: Secondary | ICD-10-CM

## 2022-08-24 DIAGNOSIS — Z87440 Personal history of urinary (tract) infections: Secondary | ICD-10-CM

## 2022-08-24 DIAGNOSIS — K5909 Other constipation: Secondary | ICD-10-CM

## 2022-08-24 DIAGNOSIS — R339 Retention of urine, unspecified: Secondary | ICD-10-CM | POA: Diagnosis not present

## 2022-08-24 DIAGNOSIS — Z7982 Long term (current) use of aspirin: Secondary | ICD-10-CM

## 2022-08-24 DIAGNOSIS — I2510 Atherosclerotic heart disease of native coronary artery without angina pectoris: Secondary | ICD-10-CM

## 2022-08-24 DIAGNOSIS — N3281 Overactive bladder: Secondary | ICD-10-CM | POA: Diagnosis not present

## 2022-08-24 DIAGNOSIS — G3184 Mild cognitive impairment, so stated: Secondary | ICD-10-CM | POA: Diagnosis not present

## 2022-09-28 ENCOUNTER — Telehealth: Payer: Self-pay | Admitting: Family Medicine

## 2022-09-28 NOTE — Telephone Encounter (Signed)
Home Health verbal orders Caller Name: tabitha  Agency Name: [facility]   Callback number: [phone], secured   Requesting OT/PT/Skilled nursing/Social Work/Speech: nursing  Reason: catheter changing    Frequency: once week one, once every two week two, once every four week four, once every two week two   Please forward to [facility] pool or providers CMA

## 2022-09-28 NOTE — Telephone Encounter (Signed)
Please ok those verbal orders  

## 2022-09-28 NOTE — Telephone Encounter (Signed)
VO given to [place]

## 2022-10-03 NOTE — Telephone Encounter (Signed)
Dana with [facility] called re: quality audit Wanted to know if CMA documents in the chart when orders are received. I advised that she did and read the order back to her as written below  No further follow-up needed

## 2022-10-05 ENCOUNTER — Telehealth: Payer: Self-pay

## 2022-10-05 NOTE — Telephone Encounter (Signed)
1021 Palliative Care Note   RN attempted to call daughter to address questions received during palliative check in call. Message came on saying, "thanks for calling please press 1 to connect your call" when 1 is pressed, it rang a couple of times and sounded like it was answered, but then call disconnected.  Went thru this twice. RN will attempt another call on Monday.  Barbette Merino, RN

## 2022-10-05 NOTE — Telephone Encounter (Signed)
1011 Palliative Care Note  On 09/25/22, volunteer reached out for palliative check in. Spoke with daughter Maren Reamer about pt. States she has depression and takes meds for it. No pain is evident. Has a catheter and daughter wonders if she is in pain when she tries to urinate. She has trouble with bowels/constipation. Since mid April, she is having one BM per week. They are using metamucil, suppositories, prune juice, and laxatives. She is scheduled to have visit from ACP nurse in May. She wants to know if her mother's dementia will be with her until she passes. She is interested in future calls.  RN will contact daughter.  Barbette Merino, RN

## 2022-10-12 ENCOUNTER — Telehealth: Payer: Self-pay | Admitting: Family Medicine

## 2022-10-12 ENCOUNTER — Other Ambulatory Visit: Payer: Self-pay | Admitting: Family Medicine

## 2022-10-12 DIAGNOSIS — Z7982 Long term (current) use of aspirin: Secondary | ICD-10-CM

## 2022-10-12 DIAGNOSIS — Z86718 Personal history of other venous thrombosis and embolism: Secondary | ICD-10-CM

## 2022-10-12 DIAGNOSIS — M160 Bilateral primary osteoarthritis of hip: Secondary | ICD-10-CM

## 2022-10-12 DIAGNOSIS — N1832 Chronic kidney disease, stage 3b: Secondary | ICD-10-CM | POA: Diagnosis not present

## 2022-10-12 DIAGNOSIS — G3184 Mild cognitive impairment, so stated: Secondary | ICD-10-CM

## 2022-10-12 DIAGNOSIS — Z8616 Personal history of COVID-19: Secondary | ICD-10-CM

## 2022-10-12 DIAGNOSIS — I6529 Occlusion and stenosis of unspecified carotid artery: Secondary | ICD-10-CM

## 2022-10-12 DIAGNOSIS — Z9181 History of falling: Secondary | ICD-10-CM

## 2022-10-12 DIAGNOSIS — J309 Allergic rhinitis, unspecified: Secondary | ICD-10-CM

## 2022-10-12 DIAGNOSIS — Z87440 Personal history of urinary (tract) infections: Secondary | ICD-10-CM

## 2022-10-12 DIAGNOSIS — K5909 Other constipation: Secondary | ICD-10-CM

## 2022-10-12 DIAGNOSIS — H900 Conductive hearing loss, bilateral: Secondary | ICD-10-CM

## 2022-10-12 DIAGNOSIS — I739 Peripheral vascular disease, unspecified: Secondary | ICD-10-CM

## 2022-10-12 DIAGNOSIS — N318 Other neuromuscular dysfunction of bladder: Secondary | ICD-10-CM

## 2022-10-12 DIAGNOSIS — Z8673 Personal history of transient ischemic attack (TIA), and cerebral infarction without residual deficits: Secondary | ICD-10-CM

## 2022-10-12 DIAGNOSIS — I1310 Hypertensive heart and chronic kidney disease without heart failure, with stage 1 through stage 4 chronic kidney disease, or unspecified chronic kidney disease: Secondary | ICD-10-CM | POA: Diagnosis not present

## 2022-10-12 DIAGNOSIS — Z466 Encounter for fitting and adjustment of urinary device: Secondary | ICD-10-CM | POA: Diagnosis not present

## 2022-10-12 DIAGNOSIS — N3281 Overactive bladder: Secondary | ICD-10-CM

## 2022-10-12 DIAGNOSIS — E440 Moderate protein-calorie malnutrition: Secondary | ICD-10-CM

## 2022-10-12 DIAGNOSIS — F341 Dysthymic disorder: Secondary | ICD-10-CM

## 2022-10-12 DIAGNOSIS — M353 Polymyalgia rheumatica: Secondary | ICD-10-CM

## 2022-10-12 DIAGNOSIS — E785 Hyperlipidemia, unspecified: Secondary | ICD-10-CM

## 2022-10-12 DIAGNOSIS — D509 Iron deficiency anemia, unspecified: Secondary | ICD-10-CM

## 2022-10-12 DIAGNOSIS — I2510 Atherosclerotic heart disease of native coronary artery without angina pectoris: Secondary | ICD-10-CM

## 2022-10-12 DIAGNOSIS — M19011 Primary osteoarthritis, right shoulder: Secondary | ICD-10-CM

## 2022-10-12 DIAGNOSIS — D631 Anemia in chronic kidney disease: Secondary | ICD-10-CM | POA: Diagnosis not present

## 2022-10-12 DIAGNOSIS — R1310 Dysphagia, unspecified: Secondary | ICD-10-CM

## 2022-10-12 DIAGNOSIS — F32A Depression, unspecified: Secondary | ICD-10-CM

## 2022-10-12 DIAGNOSIS — G629 Polyneuropathy, unspecified: Secondary | ICD-10-CM

## 2022-10-12 NOTE — Telephone Encounter (Signed)
Dr. Milinda Antis filled out form and it was faxed back

## 2022-10-12 NOTE — Telephone Encounter (Signed)
Patient dropped off document Home Health Certificate (Order ID [id number]), to be filled out by provider. Patient requested to send it via Fax within 5-days. Document is located in providers tray at front office.Please advise at fax number [phone]

## 2022-10-15 ENCOUNTER — Other Ambulatory Visit: Payer: Medicare PPO

## 2022-10-15 DIAGNOSIS — Z515 Encounter for palliative care: Secondary | ICD-10-CM

## 2022-10-15 NOTE — Progress Notes (Signed)
PATIENT NAME: Tracy Morales DOB: 1919/12/25 MRN: [id number]  PRIMARY CARE PROVIDER: Judy Pimple, MD  RESPONSIBLE PARTY:  Acct ID - Guarantor Home Phone Work Phone Relationship Acct Type  [id number] [name]* [phone]  Self P/F     [address] [place], [place] [address]   I connected with [name] Smith-daughter for [name] on 10/15/22 by a video enabled telemedicine application and verified that I am speaking with the correct person using two identifiers.   I discussed the limitations of evaluation and management by telemedicine. The patient expressed understanding and agreed to proceed.   Constipation:  Daughter questioning constipation that patient experiences on occasion.  We discussed sedentary life style, side effects of medication and poor liquid intake as being possible factors.  Patient remains on metamucil and miralax.  Daughter will use a suppository when needed.    Dementia:  Ongoing education on disease progression. Patient continues with memory loss and hallucinations.  Daughter questioned correcting patient when she is asking for her spouse who has been deceased for many years.   We discussed redirecting and not correcting as this could cause agitation and trauma if patient understands her spouse has passed.  Discussed complications related to dementia such as infections.  Daughter voiced understanding.  Patient is only able to do a pivot transfer with assistance and is wheelchair bound.  She is able to feed herself and continues to eat well.  Fluid intake has not been great per daughter.  They encourage patient frequently to drink liquids but have not been successful.   Foley cath:  Continues to be managed by [facility] and is changed monthly.  No recent UTI's reported.  We discussed s/s of UTI to watch for.  Daughter reported patient thought she needed to void and was reaching for a trash can to attempt to urinate in.   Follow up home visit scheduled for  next month.   CODE STATUS: DNR ADVANCED DIRECTIVES: Yes MOST FORM: No PPS: 30%        Truitt Merle, RN

## 2022-10-26 ENCOUNTER — Telehealth: Payer: Self-pay | Admitting: Family Medicine

## 2022-10-26 NOTE — Telephone Encounter (Signed)
Home Health verbal orders Caller Name: Annabelle Harman Agency Name: [facility]  Callback number: [phone]  Requesting Skilled nursing PRN for catheter management  Please forward to [facility] pool or providers CMA

## 2022-10-26 NOTE — Telephone Encounter (Signed)
Please ok those verbal orders  

## 2022-10-26 NOTE — Telephone Encounter (Signed)
Left VM giving Tracy Morales the VO

## 2022-11-07 ENCOUNTER — Other Ambulatory Visit: Payer: Medicare PPO

## 2022-11-07 ENCOUNTER — Telehealth: Payer: Self-pay

## 2022-11-07 DIAGNOSIS — Z515 Encounter for palliative care: Secondary | ICD-10-CM

## 2022-11-07 NOTE — Telephone Encounter (Signed)
1519 Palliative Care Note  Volunteer called pt yesterday 11/06/22 for palliative check in. Spoke with daughter Maren Reamer. "She noted that mother shows some depression and/or lack of interest in doing things like books or puzzles. She is concerned with her coughing during the day and at night. She takes antacid all day long. It doesn't seem to help. She is also concerned with her mother getting UTIs. Would like to know if there is anything OTC they could get to help." Would like a call from her nurse. She is open to future calls. Will notify clinical team.  Barbette Merino, RN

## 2022-11-07 NOTE — Progress Notes (Signed)
1642 Palliative Follow Up Encounter Note   PATIENT NAME: Tracy Morales DOB: January 06, 1920 MRN: [id number]  PRIMARY CARE PROVIDER: Judy Pimple, MD  RESPONSIBLE PARTY:  Acct ID - Guarantor Home Phone Work Phone Relationship Acct Type  [id number] [name]* [phone]  Self P/F     [name], [place], [place] [address]   I connected with daughter on 11/07/22 by telephone and verified that I am speaking with the correct person using two identifiers.   I discussed the limitations of evaluation and management by telemedicine. The patient expressed understanding and agreed to proceed    HISTORY OF PRESENT ILLNESS:  89 y.o. year old female  with dementia, OA of bilateral hips, cerebrovascular disease, OAB, CKD 4, peripheral neuropathy.   Coughing: Daughter reports that pt was started on GERD medications a little bit ago by PCP but they don't seem to be working. Daughter reports that pt coughs all day and all night.   RN enquired about dysphagia and daughter reports that PCP told her a while ago that pt needed "a tube in her stomach, but she didn't want it, so." RN asked about coughing at night and if she had noticed that pt coughed after she drank fluids. Daughter said, "yes she drinks a lot at night. She has water, juice, and ensure beside her bed and drinks them from a straw." Daughter reported that pt is propped up on pillows.   RN talked with daughter about concerns surrounding aspiration. Suggested that pt be sitting up in high fowlers position when eating and drinking.    Gi/GU: Daughter reports that she is concerned about urinary tract infections since pt has a catheter. States, "her old urologist, Dr. Sheppard Penton put her on cranberry supplements." Daughter questions if there are any other over the counter thing she could give pt to help prevent UTIs. States, " I saw a commercial for Uqora and wasn't sure if that would help."  RN talked with daughter about over the counter supplements  like uqora and D-mannose. Suggested that daughter speak to urologist before adding otc supplements. Daughter agreed and said she had an appt with him in Dec.   Next appt scheduled: RN Manson Passey scheduled to see pt in home at end of June.    PHYSICAL EXAM:   VITALS:There were no vitals filed for this visit.     Barbette Merino, RN

## 2022-11-27 ENCOUNTER — Other Ambulatory Visit: Payer: Medicare PPO

## 2022-12-07 ENCOUNTER — Telehealth: Payer: Self-pay | Admitting: Family Medicine

## 2022-12-07 NOTE — Telephone Encounter (Signed)
Don't see it in chart. Called Annabelle Harman and had her resend form

## 2022-12-07 NOTE — Telephone Encounter (Signed)
Annabelle Harman from [facility] called asking for a update on ppw that was sent over for a order approval for catheter management? Annabelle Harman stated she visited the pt on 6/28 for re certification visit & ppw was sent afterwards. Call back # [phone]

## 2022-12-08 ENCOUNTER — Other Ambulatory Visit: Payer: Self-pay | Admitting: Family Medicine

## 2022-12-10 NOTE — Telephone Encounter (Signed)
Please ok that verbal order  

## 2022-12-10 NOTE — Telephone Encounter (Signed)
Dana from [facility] called in and stated that they will not be able to see patient until they receive sign order or verbal order. She can be reached at [phone]. Thank you!

## 2022-12-10 NOTE — Telephone Encounter (Signed)
Re certification forms placed in your inbox for review. Per prev note, they can take a verbal until forms are completed. Please advise

## 2022-12-11 NOTE — Telephone Encounter (Signed)
Called left message for verbal orders as requested on secured voicemail.

## 2022-12-17 DIAGNOSIS — Z9181 History of falling: Secondary | ICD-10-CM

## 2022-12-17 DIAGNOSIS — E785 Hyperlipidemia, unspecified: Secondary | ICD-10-CM

## 2022-12-17 DIAGNOSIS — M353 Polymyalgia rheumatica: Secondary | ICD-10-CM | POA: Diagnosis not present

## 2022-12-17 DIAGNOSIS — F341 Dysthymic disorder: Secondary | ICD-10-CM

## 2022-12-17 DIAGNOSIS — I739 Peripheral vascular disease, unspecified: Secondary | ICD-10-CM | POA: Diagnosis not present

## 2022-12-17 DIAGNOSIS — I2510 Atherosclerotic heart disease of native coronary artery without angina pectoris: Secondary | ICD-10-CM | POA: Diagnosis not present

## 2022-12-17 DIAGNOSIS — Z87440 Personal history of urinary (tract) infections: Secondary | ICD-10-CM

## 2022-12-17 DIAGNOSIS — K5909 Other constipation: Secondary | ICD-10-CM

## 2022-12-17 DIAGNOSIS — Z8616 Personal history of COVID-19: Secondary | ICD-10-CM

## 2022-12-17 DIAGNOSIS — Z7982 Long term (current) use of aspirin: Secondary | ICD-10-CM

## 2022-12-17 DIAGNOSIS — G629 Polyneuropathy, unspecified: Secondary | ICD-10-CM

## 2022-12-17 DIAGNOSIS — M19011 Primary osteoarthritis, right shoulder: Secondary | ICD-10-CM | POA: Diagnosis not present

## 2022-12-17 DIAGNOSIS — Z86718 Personal history of other venous thrombosis and embolism: Secondary | ICD-10-CM

## 2022-12-17 DIAGNOSIS — M160 Bilateral primary osteoarthritis of hip: Secondary | ICD-10-CM | POA: Diagnosis not present

## 2022-12-17 DIAGNOSIS — R1310 Dysphagia, unspecified: Secondary | ICD-10-CM

## 2022-12-17 DIAGNOSIS — Z466 Encounter for fitting and adjustment of urinary device: Secondary | ICD-10-CM | POA: Diagnosis not present

## 2022-12-17 DIAGNOSIS — J309 Allergic rhinitis, unspecified: Secondary | ICD-10-CM

## 2022-12-17 DIAGNOSIS — I6529 Occlusion and stenosis of unspecified carotid artery: Secondary | ICD-10-CM

## 2022-12-17 DIAGNOSIS — E440 Moderate protein-calorie malnutrition: Secondary | ICD-10-CM

## 2022-12-17 DIAGNOSIS — H900 Conductive hearing loss, bilateral: Secondary | ICD-10-CM

## 2022-12-17 DIAGNOSIS — N318 Other neuromuscular dysfunction of bladder: Secondary | ICD-10-CM

## 2022-12-17 DIAGNOSIS — I1310 Hypertensive heart and chronic kidney disease without heart failure, with stage 1 through stage 4 chronic kidney disease, or unspecified chronic kidney disease: Secondary | ICD-10-CM | POA: Diagnosis not present

## 2022-12-17 DIAGNOSIS — D509 Iron deficiency anemia, unspecified: Secondary | ICD-10-CM

## 2022-12-17 DIAGNOSIS — N1832 Chronic kidney disease, stage 3b: Secondary | ICD-10-CM | POA: Diagnosis not present

## 2022-12-17 DIAGNOSIS — G3184 Mild cognitive impairment, so stated: Secondary | ICD-10-CM

## 2022-12-17 DIAGNOSIS — F32A Depression, unspecified: Secondary | ICD-10-CM

## 2022-12-17 DIAGNOSIS — D631 Anemia in chronic kidney disease: Secondary | ICD-10-CM | POA: Diagnosis not present

## 2022-12-17 DIAGNOSIS — Z8673 Personal history of transient ischemic attack (TIA), and cerebral infarction without residual deficits: Secondary | ICD-10-CM

## 2022-12-17 DIAGNOSIS — N3281 Overactive bladder: Secondary | ICD-10-CM

## 2022-12-22 ENCOUNTER — Other Ambulatory Visit: Payer: Self-pay | Admitting: Family Medicine

## 2023-01-16 ENCOUNTER — Telehealth: Payer: Self-pay | Admitting: Family Medicine

## 2023-01-16 NOTE — Telephone Encounter (Signed)
Home Health verbal orders Caller Name:Donna Agency Name: [facility]  Callback number: [phone]  Requesting OT/PT/Skilled nursing/Social Work/Speech:  Reason:Social worker  Frequency:  Please forward to [facility] pool or providers CMA

## 2023-01-17 ENCOUNTER — Other Ambulatory Visit: Payer: Self-pay | Admitting: Family Medicine

## 2023-01-17 NOTE — Telephone Encounter (Signed)
Left VM giving Tracy Morales the VO

## 2023-01-17 NOTE — Telephone Encounter (Signed)
Please ok those verbal orders  

## 2023-01-23 ENCOUNTER — Ambulatory Visit: Payer: Medicare PPO | Admitting: Podiatry

## 2023-01-23 ENCOUNTER — Encounter: Payer: Self-pay | Admitting: Podiatry

## 2023-01-23 DIAGNOSIS — B351 Tinea unguium: Secondary | ICD-10-CM | POA: Diagnosis not present

## 2023-01-23 DIAGNOSIS — M79674 Pain in right toe(s): Secondary | ICD-10-CM | POA: Diagnosis not present

## 2023-01-23 DIAGNOSIS — E1149 Type 2 diabetes mellitus with other diabetic neurological complication: Secondary | ICD-10-CM

## 2023-01-23 DIAGNOSIS — M79675 Pain in left toe(s): Secondary | ICD-10-CM | POA: Diagnosis not present

## 2023-01-24 NOTE — Progress Notes (Signed)
She presents today with her grandson for painful elongated toenails.  She is wheelchair-bound.  Objective: Pulses are palpable she is able to extend her knee so that I could cut her nails.  She has an indwelling catheter.  Toenails are long thick yellow dystrophic clinically mycotic.  No open lesions or wounds.  Assessment: Pain limb secondary to onychomycosis  Plan: Discussed etiology pathology conservative surgical therapies at this point I debrided her nails.  She was able to put her own socks and shoes on from her wheelchair.

## 2023-02-01 ENCOUNTER — Telehealth: Payer: Self-pay | Admitting: Family Medicine

## 2023-02-01 NOTE — Telephone Encounter (Signed)
Pleaes ok that verbal order

## 2023-02-01 NOTE — Telephone Encounter (Signed)
VO left on VM.  °

## 2023-02-01 NOTE — Telephone Encounter (Signed)
Home Health verbal orders Caller Name: Charlynne Pander  Agency Name: [facility]  Callback number: [phone], secured voicemail   Requesting OT/PT/Skilled nursing/Social Work/Speech:  Reason: Re certify to see pt monthly for foley catheter replacement   Frequency: Once monthly  Please forward to [facility] pool or providers CMA

## 2023-02-08 ENCOUNTER — Telehealth: Payer: Self-pay | Admitting: Family Medicine

## 2023-02-08 NOTE — Telephone Encounter (Signed)
VO given to [facility]

## 2023-02-08 NOTE — Telephone Encounter (Signed)
Home Health verbal orders Caller Name: Annabelle Harman Agency Name: [facility]  Callback number: [phone]  Requesting VO Approval for cath change next week  Please forward to [facility] pool or providers CMA

## 2023-02-08 NOTE — Telephone Encounter (Signed)
Please ok that verbal order  

## 2023-02-13 ENCOUNTER — Telehealth: Payer: Self-pay | Admitting: Family Medicine

## 2023-02-13 NOTE — Telephone Encounter (Signed)
Called daughter. Patient is starting to have some frequency with urination. Just started. Denies any fever, nausea or vomiting. We did not have anything open in our office tomorrow. Have set up with [facility] tomorrow afternoon. If any changes she will give [place] a call.

## 2023-02-13 NOTE — Telephone Encounter (Signed)
Patient  daughter Dondra Prader  called stated that her mother is having UTI symptoms again and would like for the nurse to give her a call at [phone]

## 2023-02-13 NOTE — Telephone Encounter (Signed)
Thanks  Aware, will watch for correspondence

## 2023-02-14 ENCOUNTER — Ambulatory Visit: Payer: Medicare PPO | Admitting: Nurse Practitioner

## 2023-02-14 ENCOUNTER — Encounter: Payer: Self-pay | Admitting: Nurse Practitioner

## 2023-02-14 VITALS — BP 116/76 | HR 87 | Temp 98.0°F | Ht 65.0 in

## 2023-02-14 DIAGNOSIS — R399 Unspecified symptoms and signs involving the genitourinary system: Secondary | ICD-10-CM

## 2023-02-14 LAB — POC URINALSYSI DIPSTICK (AUTOMATED)
Bilirubin, UA: NEGATIVE
Glucose, UA: NEGATIVE
Ketones, UA: NEGATIVE
Nitrite, UA: POSITIVE
Protein, UA: NEGATIVE
Spec Grav, UA: 1.01 (ref 1.010–1.025)
Urobilinogen, UA: 0.2 U/dL
pH, UA: 6 (ref 5.0–8.0)

## 2023-02-14 MED ORDER — CEPHALEXIN 500 MG PO CAPS: 500.0000 mg | ORAL_CAPSULE | Freq: Two times a day (BID) | ORAL | 0 refills | Status: AC

## 2023-02-14 NOTE — Patient Instructions (Addendum)
Urinalysis shows some infection. Antibiotic sent to pharmacy. Will call with culture result. Increase fluid intake and take probiotic with antibiotic or eat yogurt.

## 2023-02-14 NOTE — Progress Notes (Signed)
Established Patient Office Visit  Subjective:  Patient ID: Tracy Morales, female    DOB: September 12, 1919  Age: 89 y.o. MRN: [id number]  CC:  Chief Complaint  Patient presents with   Acute Visit    HPI  Tracy Morales presents with her grandson for having UTI symptoms from last 3 days. She is wheel chair bound and has an indwelling catheter.  The grandson Dois Davenport ports that she has been experiencing frequency.  Denies any fever, confusion, chest pain or shortness of breath.  HPI   Past Medical History:  Diagnosis Date   Allergic rhinitis, cause unspecified    Bradycardia    Cerebrovascular disease, unspecified    Chronic kidney disease, unspecified    chronic renal insufficiency   Dizziness and giddiness    DVT (deep venous thrombosis) (HCC)    R   Dyspnea    echo with normal EF. 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   HTN (hypertension)    Hyperlipidemia    Hypertonicity of bladder    hx   Mild depression    Mononeuritis of lower limb, unspecified    peripheral neuropathy, bilateral   Occlusion and stenosis of carotid artery without mention of cerebral infarction 09/03/2007   u/s L 40-59%, R 0-39%; stable for many years   Osteoarthrosis, unspecified whether generalized or localized, unspecified site    Peripheral vascular disease, unspecified (HCC)    Polymyalgia rheumatica (HCC)    hx    Past Surgical History:  Procedure Laterality Date   2D echo  8/06   mild aortic stenosis.    arterial doppler     carotid doppler  4/02   bilateral plaque    carotid doppler  8/06   non significant stenosis   carotid doppler     R 40%; L 40-60%   CATARACT EXTRACTION     CHOLECYSTECTOMY  2000   COLONOSCOPY  1/04   polyps   CT SCAN  05/25/08   abd and pelvis- inflated urinary bladder and large amt stool throughout w/o blockage    dexa  2/01   osteopenia   echo     mild LVH EF 50% As   FEMUR IM NAIL Right 03/18/2021   Procedure:  INTRAMEDULLARY (IM) NAIL FEMORAL;  Surgeon: Christena Flake, MD;  Location: [facility] ORS;  Service: Orthopedics;  Laterality: Right;   triger finger contracture  6/04   vert. basilar insufficiency  2002   zoster  5/02    Family History  Problem Relation Age of Onset   Hypertension Father    Other Father        Cardiac problems   Heart disease Father    Other Brother        blood clot   Other Sister        benign breast nodule    Social History   Socioeconomic History   Marital status: Widowed    Spouse name: Not on file   Number of children: 2   Years of education: Not on file   Highest education level: Not on file  Occupational History   Occupation: Retired  Tobacco Use   Smoking status: Never   Smokeless tobacco: Never   Tobacco comments:    non smoker   Vaping Use   Vaping status: Never Used  Substance and Sexual Activity   Alcohol use: No    Alcohol/week: 0.0 standard drinks of alcohol   Drug use: No  Sexual activity: Never  Other Topics Concern   Not on file  Social History Narrative   Widowed. 2 children. Retired, gets regular exercise.    Social Determinants of Health   Financial Resource Strain: Low Risk  (07/19/2022)   Overall Financial Resource Strain (CARDIA)    Difficulty of Paying Living Expenses: Not hard at all  Food Insecurity: No Food Insecurity (07/24/2022)   Hunger Vital Sign    Worried About Running Out of Food in the Last Year: Never true    Ran Out of Food in the Last Year: Never true  Transportation Needs: No Transportation Needs (07/24/2022)   PRAPARE - Administrator, Civil Service (Medical): No    Lack of Transportation (Non-Medical): No  Physical Activity: Inactive (07/19/2022)   Exercise Vital Sign    Days of Exercise per Week: 0 days    Minutes of Exercise per Session: 0 min  Stress: No Stress Concern Present (07/19/2022)   [employer] of Occupational Health - Occupational Stress Questionnaire    Feeling of Stress :  Only a little  Social Connections: Socially Isolated (07/19/2022)   Social Connection and Isolation Panel [NHANES]    Frequency of Communication with Friends and Family: Twice a week    Frequency of Social Gatherings with Friends and Family: More than three times a week    Attends Religious Services: Never    Database administrator or Organizations: No    Attends Banker Meetings: Never    Marital Status: Widowed  Intimate Partner Violence: Not At Risk (07/19/2022)   Humiliation, Afraid, Rape, and Kick questionnaire    Fear of Current or Ex-Partner: No    Emotionally Abused: No    Physically Abused: No    Sexually Abused: No     Outpatient Medications Prior to Visit  Medication Sig Dispense Refill   acetaminophen (TYLENOL) 500 MG tablet Take 500 mg by mouth every 6 (six) hours as needed. OTC-UAD     amLODipine (NORVASC) 5 MG tablet TAKE 1 TABLET BY MOUTH EVERY DAY 90 tablet 1   aspirin 325 MG EC tablet Take 325 mg by mouth daily.     CRANBERRY PO Take 500 mg by mouth 3 (three) times daily.     famotidine (PEPCID) 20 MG tablet TAKE 1 TABLET BY MOUTH TWICE A DAY 180 tablet 0   feeding supplement (ENSURE ENLIVE / ENSURE PLUS) LIQD Take 237 mLs by mouth 2 (two) times daily between meals. 237 mL 12   LORazepam (ATIVAN) 0.5 MG tablet Take 1 tablet (0.5 mg total) by mouth at bedtime. Caution of sedation 30 tablet 1   Meclizine HCl (BONINE PO) Take 50 mg by mouth as needed.     mirtazapine (REMERON) 30 MG tablet TAKE 1 TABLET BY MOUTH EVERYDAY AT BEDTIME 90 tablet 3   omeprazole (PRILOSEC) 20 MG capsule TAKE 1 CAPSULE BY MOUTH EVERY DAY 90 capsule 1   polyethylene glycol (MIRALAX / GLYCOLAX) 17 g packet Take 17 g by mouth 2 (two) times daily. 14 each 0   psyllium (METAMUCIL) 58.6 % packet Take 1 packet by mouth daily as needed.     simvastatin (ZOCOR) 20 MG tablet TAKE 1 TABLET BY MOUTH EVERY DAY 90 tablet 1   vitamin C (ASCORBIC ACID) 500 MG tablet Take 500 mg by mouth 3 (three)  times daily.     No facility-administered medications prior to visit.    Allergies  Allergen Reactions   Ace Inhibitors  REACTION: cough   Donepezil Diarrhea and Other (See Comments)    GI problems Other reaction(s): Other (See Comments) GI problems   Sulfa Antibiotics Nausea Only    headache    ROS Review of Systems Negative unless indicated in HPI.    Objective:    Physical Exam Constitutional:      Appearance: Normal appearance.  Cardiovascular:     Rate and Rhythm: Normal rate and regular rhythm.     Pulses: Normal pulses.     Heart sounds: Normal heart sounds.  Pulmonary:     Effort: Pulmonary effort is normal.     Breath sounds: Normal breath sounds. No wheezing.  Abdominal:     General: Bowel sounds are normal.     Palpations: Abdomen is soft.     Tenderness: There is no abdominal tenderness. There is no right CVA tenderness or left CVA tenderness.  Musculoskeletal:     Cervical back: Normal range of motion.  Neurological:     General: No focal deficit present.     Mental Status: She is alert. Mental status is at baseline.  Psychiatric:        Mood and Affect: Mood normal.        Behavior: Behavior normal.        Thought Content: Thought content normal.        Judgment: Judgment normal.     BP 116/76   Pulse 87   Temp 98 F (36.7 C)   Ht 5\' 5"  (1.651 m)   SpO2 97%   BMI 19.97 kg/m  Wt Readings from Last 3 Encounters:  07/19/22 120 lb (54.4 kg)  05/09/22 120 lb (54.4 kg)  11/10/21 123 lb (55.8 kg)     Health Maintenance  Topic Date Due   DTaP/Tdap/Td (3 - Td or Tdap) 10/10/2021   Medicare Annual Wellness (AWV)  07/20/2023   Pneumonia Vaccine 4+ Years old  Completed   INFLUENZA VACCINE  Completed   DEXA SCAN  Completed   COVID-19 Vaccine  Completed   Zoster Vaccines- Shingrix  Completed   HPV VACCINES  Aged Out    There are no preventive care reminders to display for this patient.  Lab Results  Component Value Date   TSH 4.69  07/13/2022   Lab Results  Component Value Date   WBC 4.9 07/13/2022   HGB 13.3 07/13/2022   HCT 40.0 07/13/2022   MCV 89.4 07/13/2022   PLT 280.0 07/13/2022   Lab Results  Component Value Date   NA 137 07/13/2022   K 4.4 07/13/2022   CO2 29 07/13/2022   GLUCOSE 92 07/13/2022   BUN 18 07/13/2022   CREATININE 1.43 (H) 07/13/2022   BILITOT 0.5 07/13/2022   ALKPHOS 125 (H) 07/13/2022   AST 20 07/13/2022   ALT 9 07/13/2022   PROT 6.6 07/13/2022   ALBUMIN 3.4 (L) 07/13/2022   CALCIUM 9.0 07/13/2022   ANIONGAP 6 03/19/2021   GFR 30.03 (L) 07/13/2022   Lab Results  Component Value Date   CHOL 167 07/13/2022   Lab Results  Component Value Date   HDL 35.50 (L) 07/13/2022   Lab Results  Component Value Date   LDLCALC 114 (H) 07/13/2022   Lab Results  Component Value Date   TRIG 90.0 07/13/2022   Lab Results  Component Value Date   CHOLHDL 5 07/13/2022   Lab Results  Component Value Date   HGBA1C 5.8 07/13/2022      Assessment & Plan:  UTI symptoms  Assessment & Plan: The POCT urinalysis positive for moderate blood, nitrite and large leukocytes. Urine culture pending. Will treat with cephalexin, advised to increase fluid intake and take probiotic or consume yogurt while on antibiotic.  Orders: -     POCT Urinalysis Dipstick (Automated) -     Urine Culture  Other orders -     Cephalexin; Take 1 capsule (500 mg total) by mouth 2 (two) times daily for 7 days.  Dispense: 14 capsule; Refill: 0    Follow-up: Return if symptoms worsen or fail to improve.   Kara Dies, NP

## 2023-02-15 LAB — URINE CULTURE
MICRO NUMBER:: 15458140
SPECIMEN QUALITY:: ADEQUATE

## 2023-02-17 DIAGNOSIS — R399 Unspecified symptoms and signs involving the genitourinary system: Secondary | ICD-10-CM | POA: Insufficient documentation

## 2023-02-17 NOTE — Progress Notes (Signed)
Please inform the patient: The urine culture showed mixed bacterial flora which means a different type of bacteria in the urine sample and this can happen due to sample contamination. If your symptoms are not getting we may need to repeat the culture.

## 2023-02-17 NOTE — Assessment & Plan Note (Signed)
The POCT urinalysis positive for moderate blood, nitrite and large leukocytes. Urine culture pending. Will treat with cephalexin, advised to increase fluid intake and take probiotic or consume yogurt while on antibiotic.

## 2023-03-13 ENCOUNTER — Other Ambulatory Visit: Payer: Self-pay | Admitting: Family Medicine

## 2023-03-23 ENCOUNTER — Other Ambulatory Visit: Payer: Self-pay | Admitting: Family Medicine

## 2023-03-29 ENCOUNTER — Telehealth: Payer: Self-pay | Admitting: Family Medicine

## 2023-03-29 NOTE — Telephone Encounter (Signed)
Dr. Milinda Antis is out of the office and can not prescribe medication without a visit. Patient will need to be seen virtually or go to [facility] for appt.

## 2023-03-29 NOTE — Telephone Encounter (Signed)
Patient daughter called in and stated that patient tested positive for Covid. They are wanting to know if something could be sent in for cough. Please advise. Thank you!

## 2023-04-01 ENCOUNTER — Telehealth: Payer: Self-pay | Admitting: Family Medicine

## 2023-04-01 NOTE — Telephone Encounter (Signed)
Please ok those verbal orders  

## 2023-04-01 NOTE — Telephone Encounter (Signed)
Spoke to pt's daughter, Maren Reamer, she stated she'd give office a call back to schedule

## 2023-04-01 NOTE — Telephone Encounter (Signed)
Home Health verbal orders Caller Name:Mirica Agency Name: [facility]  Callback [phone]   Requesting Skilled nursing/ social work   Frequency:2x week 2, 1 week 3/ 1 week 2, 1 every 2 week 2, 1 week 1 every 3 weeks   Please forward to [facility] pool or providers CMA

## 2023-04-01 NOTE — Telephone Encounter (Signed)
Left VM giving VO ?

## 2023-04-02 ENCOUNTER — Ambulatory Visit: Payer: Medicare PPO | Admitting: Family Medicine

## 2023-04-02 ENCOUNTER — Encounter: Payer: Self-pay | Admitting: Family Medicine

## 2023-04-02 VITALS — BP 136/78 | HR 61 | Temp 97.8°F

## 2023-04-02 DIAGNOSIS — U071 COVID-19: Secondary | ICD-10-CM

## 2023-04-02 MED ORDER — PREDNISONE 20 MG PO TABS: 20.0000 mg | ORAL_TABLET | Freq: Every day | ORAL | 0 refills | Status: DC

## 2023-04-02 NOTE — Patient Instructions (Addendum)
Drink fluids and rest  mucinex DM is good for cough and congestion  Nasal saline for congestion as needed  Tylenol for fever or pain or headache  Please alert [place] if symptoms worsen (if severe or short of breath please go to the ER)   Take the prednisone as directed 20 mg daily for 5 days in the am  This is to help inflammation in airway  It may make you feel hyper and hungry    Update if not starting to improve in a week or if worsening   If any severe symptoms - go to the ER   Watch for worse cough, fever, trouble breathing   Do not go out until symptoms are better  Then after feeling better continue to mask for 10 more days if in public

## 2023-04-02 NOTE — Progress Notes (Signed)
Subjective:    Patient ID: Tracy Morales, female    DOB: [birth year], 89 y.o.   MRN: [id number]  HPI  Wt Readings from Last 3 Encounters:  07/19/22 120 lb (54.4 kg)  05/09/22 120 lb (54.4 kg)  11/10/21 123 lb (55.8 kg)      Vitals:   04/02/23 1121  BP: 136/78  Pulse: 61  Temp: 97.8 F (36.6 C)  SpO2: 97%     Here for covid 19 Thinks she tested positive on Friday   Started last week  Bad cough -some phlegm, not a lot and it is clear  No shortness of breath  No wheezing or tightness   No fever that she knows of   Nasal congestion /runny nose  No ST   Not drinking enough fluids in general/ this is a struggle   No GI symptoms   No loss of taste or smell   Over the counter  Cough med- [facility], then nyquil and day quil  Cough drops  Encouraging fluids   Lab Results  Component Value Date   NA 137 07/13/2022   K 4.4 07/13/2022   CO2 29 07/13/2022   GLUCOSE 92 07/13/2022   BUN 18 07/13/2022   CREATININE 1.43 (H) 07/13/2022   CALCIUM 9.0 07/13/2022   GFR 30.03 (L) 07/13/2022   GFRNONAA 31 (L) 03/19/2021     Patient Active Problem List   Diagnosis Date Noted   UTI symptoms 02/17/2023   GERD (gastroesophageal reflux disease) 07/20/2022   Advanced age 24/31/2023   Heartburn 08/16/2021   Dysthymia 07/19/2021   Hematoma 04/19/2021   Urinary retention 04/11/2021   Bed sore 04/11/2021   Iron deficiency anemia 04/11/2021   Malnutrition of moderate degree 03/20/2021   ABLA 03/19/2021   DNR (do not resuscitate) 03/18/2021   Closed displaced transverse fracture of shaft of right femur (HCC)    COVID-19 01/19/2021   Pincer nail deformity 07/04/2020   Seborrheic dermatitis of scalp 08/27/2019   Elevated random blood glucose level 05/11/2019   Pain due to onychomycosis of toenails of both feet 12/04/2018   Recurrent UTI 09/22/2018   Low back pain 09/15/2018   Dementia (HCC) 05/11/2018   Mobility impaired 05/06/2017   Osteoarthritis of both hips  04/15/2015   Routine general medical examination at a health care facility 04/12/2015   Hair loss 11/19/2014   Arthritis of right shoulder region 06/09/2014   Nodule of left lung 06/09/2014   Encounter for Medicare annual wellness exam 10/30/2013   H/O fall 04/27/2013   Risk for falls 04/11/2012   MICROSCOPIC HEMATURIA 08/29/2009   CONDUCTIVE HEARING LOSS BILATERAL 08/08/2009   EDEMA 04/07/2009   BRADYCARDIA 10/26/2008   DYSPNEA 10/26/2008   INCONTINENCE, URGE 02/27/2008   Hyperlipidemia 10/11/2006   Mononeuritis of lower limb 10/11/2006   Essential hypertension 10/11/2006   DIASTOLIC DYSFUNCTION 10/11/2006   CAROTID STENOSIS 10/11/2006   CEREBROVASCULAR DISEASE 10/11/2006   PERIPHERAL VASCULAR DISEASE 10/11/2006   Allergic rhinitis 10/11/2006   CKD stage IIIb (HCC) 10/11/2006   OVERACTIVE BLADDER 10/11/2006   Osteoarthritis 10/11/2006   History of cardiovascular disorder 10/11/2006   Past Medical History:  Diagnosis Date   Allergic rhinitis, cause unspecified    Bradycardia    Cerebrovascular disease, unspecified    Chronic kidney disease, unspecified    chronic renal insufficiency   Dizziness and giddiness    DVT (deep venous thrombosis) (HCC)    R   Dyspnea    echo with normal EF. +diastolic  dysfunction   Failure to thrive in childhood    gariatric   HTN (hypertension)    Hyperlipidemia    Hypertonicity of bladder    hx   Mild depression    Mononeuritis of lower limb, unspecified    peripheral neuropathy, bilateral   Occlusion and stenosis of carotid artery without mention of cerebral infarction 09/03/2007   u/s L 40-59%, R 0-39%; stable for many years   Osteoarthrosis, unspecified whether generalized or localized, unspecified site    Peripheral vascular disease, unspecified (HCC)    Polymyalgia rheumatica (HCC)    hx   Past Surgical History:  Procedure Laterality Date   2D echo  8/06   mild aortic stenosis.    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 basilar insufficiency  2002   zoster  5/02   Social History   Tobacco Use   Smoking status: Never   Smokeless tobacco: Never   Tobacco comments:    non smoker   Vaping Use   Vaping status: Never Used  Substance Use Topics   Alcohol use: No    Alcohol/week: 0.0 standard drinks of alcohol   Drug use: No   Family History  Problem Relation Age of Onset   Hypertension Father    Other Father        Cardiac problems   Heart disease Father    Other Brother        blood clot   Other Sister        benign breast nodule   Allergies  Allergen Reactions   Ace Inhibitors     REACTION: cough   Donepezil Diarrhea and Other (See Comments)    GI problems Other reaction(s): Other (See Comments) GI problems   Sulfa Antibiotics Nausea Only    headache   Current Outpatient Medications on File Prior to Visit  Medication Sig Dispense Refill   acetaminophen (TYLENOL) 500 MG tablet Take 500 mg by mouth every 6 (six) hours as needed. OTC-UAD     amLODipine (NORVASC) 5 MG tablet TAKE 1 TABLET BY MOUTH EVERY DAY 90 tablet 1   aspirin 325 MG EC tablet Take 325 mg by mouth daily.     CRANBERRY PO Take 500 mg by mouth 3 (three) times daily.     famotidine (PEPCID) 20 MG tablet TAKE 1 TABLET BY MOUTH TWICE A DAY 180 tablet 0   feeding supplement (ENSURE ENLIVE / ENSURE PLUS) LIQD Take 237 mLs by mouth 2 (two) times daily between  meals. 237 mL 12   LORazepam (ATIVAN) 0.5 MG tablet Take 1 tablet (0.5 mg total) by mouth at bedtime. Caution of sedation 30 tablet 1   Meclizine HCl (BONINE PO) Take 50 mg by mouth  as needed.     mirtazapine (REMERON) 30 MG tablet TAKE 1 TABLET BY MOUTH EVERYDAY AT BEDTIME 90 tablet 3   omeprazole (PRILOSEC) 20 MG capsule TAKE 1 CAPSULE BY MOUTH EVERY DAY 90 capsule 1   polyethylene glycol (MIRALAX / GLYCOLAX) 17 g packet Take 17 g by mouth 2 (two) times daily. 14 each 0   psyllium (METAMUCIL) 58.6 % packet Take 1 packet by mouth daily as needed.     simvastatin (ZOCOR) 20 MG tablet TAKE 1 TABLET BY MOUTH EVERY DAY 90 tablet 1   vitamin C (ASCORBIC ACID) 500 MG tablet Take 500 mg by mouth 3 (three) times daily.     No current facility-administered medications on file prior to visit.    Review of Systems  Constitutional:  Positive for appetite change and fatigue. Negative for fever.  HENT:  Positive for congestion, postnasal drip, rhinorrhea, sinus pressure, sneezing and sore throat. Negative for ear pain.   Eyes:  Negative for pain and discharge.  Respiratory:  Positive for cough. Negative for shortness of breath, wheezing and stridor.   Cardiovascular:  Negative for chest pain.  Gastrointestinal:  Negative for diarrhea, nausea and vomiting.  Genitourinary:  Negative for frequency, hematuria and urgency.  Musculoskeletal:  Negative for arthralgias and myalgias.  Skin:  Negative for rash.  Neurological:  Negative for dizziness, weakness, light-headedness and headaches.  Psychiatric/Behavioral:  Negative for confusion and dysphoric mood.        Objective:   Physical Exam Constitutional:      General: She is not in acute distress.    Appearance: Normal appearance. She is well-developed and normal weight. She is not ill-appearing, toxic-appearing or diaphoretic.     Comments: Frail  Advanced age  Wheelchair   HENT:     Head: Normocephalic and atraumatic.     Comments: Nares are  injected and congested    No facial tenderness    Right Ear: Tympanic membrane, ear canal and external ear normal.     Left Ear: Tympanic membrane, ear canal and external ear normal.     Nose: Congestion and rhinorrhea present.     Mouth/Throat:     Mouth: Mucous membranes are moist.     Pharynx: Oropharynx is clear. No oropharyngeal exudate or posterior oropharyngeal erythema.     Comments: Clear pnd  Eyes:     General:        Right eye: No discharge.        Left eye: No discharge.     Conjunctiva/sclera: Conjunctivae normal.     Pupils: Pupils are equal, round, and reactive to light.  Cardiovascular:     Rate and Rhythm: Normal rate.     Heart sounds: Normal heart sounds.  Pulmonary:     Effort: Pulmonary effort is normal. No respiratory distress.     Breath sounds: No stridor. Rhonchi present. No wheezing or rales.     Comments: Upper airway sounds noted No rales  No crackles or wheeze  Fair air exchange-does not take a very deep breath  Cough sounds junky - able to spit out clear phlegm in office  Chest:     Chest wall: No tenderness.  Musculoskeletal:     Cervical back: Normal range of motion and neck supple.  Lymphadenopathy:     Cervical: No cervical adenopathy.  Skin:    General: Skin is warm and dry.     Capillary Refill: Capillary refill takes less than 2 seconds.     Findings:  No rash.  Neurological:     Mental Status: She is alert.     Cranial Nerves: No cranial nerve deficit.  Psychiatric:        Mood and Affect: Mood normal.     Comments: In good spirits            Assessment & Plan:   Problem List Items Addressed This Visit       Other   COVID-19 - Primary    Likely day 5  Cough and other symptoms have started to improve Reassuring pulse ox, vitals and exam  Not candidate for paxlovid based on renal function, length of illness and interactions with several medications  Advanced age noted   Discussed symptom care-see AVS Will try  prednisone 20 mg daily for 5 d for congestion and airway inflammation  Discussed side effects Watching closely for signs and symptoms of pna  If imaging needed would need to be in imaging center since she cannot stand  Update if not starting to improve in a week or if worsening  Call back and Er precautions noted in detail today  Handout given

## 2023-04-02 NOTE — Assessment & Plan Note (Addendum)
Likely day 5  Cough and other symptoms have started to improve Reassuring pulse ox, vitals and exam  Not candidate for paxlovid based on renal function, length of illness and interactions with several medications  Advanced age noted   Discussed symptom care-see AVS Will try prednisone 20 mg daily for 5 d for congestion and airway inflammation  Discussed side effects Watching closely for signs and symptoms of pna  If imaging needed would need to be in imaging center since she cannot stand  Update if not starting to improve in a week or if worsening  Call back and Er precautions noted in detail today  Handout given

## 2023-04-14 ENCOUNTER — Other Ambulatory Visit: Payer: Self-pay | Admitting: Family Medicine

## 2023-04-16 DIAGNOSIS — I739 Peripheral vascular disease, unspecified: Secondary | ICD-10-CM | POA: Diagnosis not present

## 2023-04-16 DIAGNOSIS — M353 Polymyalgia rheumatica: Secondary | ICD-10-CM | POA: Diagnosis not present

## 2023-04-16 DIAGNOSIS — M19011 Primary osteoarthritis, right shoulder: Secondary | ICD-10-CM

## 2023-04-16 DIAGNOSIS — F0393 Unspecified dementia, unspecified severity, with mood disturbance: Secondary | ICD-10-CM | POA: Diagnosis not present

## 2023-04-16 DIAGNOSIS — I1310 Hypertensive heart and chronic kidney disease without heart failure, with stage 1 through stage 4 chronic kidney disease, or unspecified chronic kidney disease: Secondary | ICD-10-CM | POA: Diagnosis not present

## 2023-04-16 DIAGNOSIS — D631 Anemia in chronic kidney disease: Secondary | ICD-10-CM | POA: Diagnosis not present

## 2023-04-16 DIAGNOSIS — F32A Depression, unspecified: Secondary | ICD-10-CM | POA: Diagnosis not present

## 2023-04-16 DIAGNOSIS — N1832 Chronic kidney disease, stage 3b: Secondary | ICD-10-CM | POA: Diagnosis not present

## 2023-04-16 DIAGNOSIS — M160 Bilateral primary osteoarthritis of hip: Secondary | ICD-10-CM

## 2023-04-16 DIAGNOSIS — I2510 Atherosclerotic heart disease of native coronary artery without angina pectoris: Secondary | ICD-10-CM

## 2023-04-16 DIAGNOSIS — Z466 Encounter for fitting and adjustment of urinary device: Secondary | ICD-10-CM | POA: Diagnosis not present

## 2023-04-16 DIAGNOSIS — E440 Moderate protein-calorie malnutrition: Secondary | ICD-10-CM | POA: Diagnosis not present

## 2023-04-25 ENCOUNTER — Ambulatory Visit: Payer: Medicare PPO | Admitting: Podiatry

## 2023-05-10 ENCOUNTER — Ambulatory Visit: Payer: Medicare PPO | Admitting: Urology

## 2023-05-10 ENCOUNTER — Encounter: Payer: Self-pay | Admitting: Urology

## 2023-05-10 VITALS — BP 130/74 | HR 84 | Ht 59.0 in | Wt 120.0 lb

## 2023-05-10 DIAGNOSIS — N390 Urinary tract infection, site not specified: Secondary | ICD-10-CM | POA: Diagnosis not present

## 2023-05-10 DIAGNOSIS — R339 Retention of urine, unspecified: Secondary | ICD-10-CM

## 2023-05-10 DIAGNOSIS — Z87440 Personal history of urinary (tract) infections: Secondary | ICD-10-CM | POA: Diagnosis not present

## 2023-05-10 NOTE — Progress Notes (Signed)
I, Maysun Anabel Bene, acting as a scribe for Riki Altes, MD., have documented all relevant documentation on the behalf of Riki Altes, MD, as directed by Riki Altes, MD while in the presence of Riki Altes, MD.  05/10/2023 3:15 PM   [name] 09-07-1919 [id number]  Referring provider: Judy Pimple, MD [address] [place],  [place] [address]  Chief Complaint  Patient presents with   Urinary Retention   Urologic history: 1.  Chronic urinary retention   2.  Recurrent UTI  HPI: Tracy Morales is a 89 y.o. female presents for annual follow-up period. She presents today with her grandson.  History of chronic urinary retention on CIC. Hip fracture October 2022 and after that injury, she was unable to perform self-cath and has a chronic indwelling Foley, which is changed monthly by home health.  History of recurrent UTI and her grandson states she recently has been having urgency and bladder spasms. Her daughter wanted her to be checked for a UTI today.  No fever or chills.    PMH: Past Medical History:  Diagnosis Date   Allergic rhinitis, cause unspecified    Bradycardia    Cerebrovascular disease, unspecified    Chronic kidney disease, unspecified    chronic renal insufficiency   Dizziness and giddiness    DVT (deep venous thrombosis) (HCC)    R   Dyspnea    echo with normal EF. 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   HTN (hypertension)    Hyperlipidemia    Hypertonicity of bladder    hx   Mild depression    Mononeuritis of lower limb, unspecified    peripheral neuropathy, bilateral   Occlusion and stenosis of carotid artery without mention of cerebral infarction 09/03/2007   u/s L 40-59%, R 0-39%; stable for many years   Osteoarthrosis, unspecified whether generalized or localized, unspecified site    Peripheral vascular disease, unspecified (HCC)    Polymyalgia rheumatica (HCC)    hx    Surgical  History: Past Surgical History:  Procedure Laterality Date   2D echo  8/06   mild aortic stenosis.    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 basilar insufficiency  2002   zoster  5/02    Home Medications:  Allergies as of 05/10/2023       Reactions   Ace Inhibitors    REACTION: cough   Donepezil Diarrhea, Other (See Comments)   GI problems Other reaction(s): Other (See Comments) GI problems   Sulfa Antibiotics Nausea Only   headache        Medication List        Accurate  as of May 10, 2023  3:15 PM. If you have any questions, ask your nurse or doctor.          acetaminophen 500 MG tablet Commonly known as: TYLENOL Take 500 mg by mouth every 6 (six) hours as needed. OTC-UAD   amLODipine 5 MG tablet Commonly known as: NORVASC TAKE 1 TABLET BY MOUTH EVERY DAY   ascorbic acid 500 MG tablet Commonly known as: VITAMIN C Take 500 mg by mouth 3 (three) times daily.   aspirin EC 325 MG tablet Take 325 mg by mouth daily.   BONINE PO Take 50 mg by mouth as needed.   CRANBERRY PO Take 500 mg by mouth 3 (three) times daily.   famotidine 20 MG tablet Commonly known as: PEPCID TAKE 1 TABLET BY MOUTH TWICE A DAY   feeding supplement Liqd Take 237 mLs by mouth 2 (two) times daily between meals.   LORazepam 0.5 MG tablet Commonly known as: ATIVAN Take 1 tablet (0.5 mg total) by mouth at  bedtime. Caution of sedation   mirtazapine 30 MG tablet Commonly known as: REMERON TAKE 1 TABLET BY MOUTH EVERYDAY AT BEDTIME   omeprazole 20 MG capsule Commonly known as: PRILOSEC TAKE 1 CAPSULE BY MOUTH EVERY DAY   polyethylene glycol 17 g packet Commonly known as: MIRALAX / GLYCOLAX Take 17 g by mouth 2 (two) times daily.   predniSONE 20 MG tablet Commonly known as: DELTASONE Take 1 tablet (20 mg total) by mouth daily with breakfast.   psyllium 58.6 % packet Commonly known as: METAMUCIL Take 1 packet by mouth daily as needed.   simvastatin 20 MG tablet Commonly known as: ZOCOR TAKE 1 TABLET BY MOUTH EVERY DAY        Allergies:  Allergies  Allergen Reactions   Ace Inhibitors     REACTION: cough   Donepezil Diarrhea and Other (See Comments)    GI problems Other reaction(s): Other (See Comments) GI problems   Sulfa Antibiotics Nausea Only    headache    Family History: Family History  Problem Relation Age of Onset   Hypertension Father    Other Father        Cardiac problems   Heart disease Father    Other Brother        blood clot   Other Sister        benign breast nodule    Social History:  reports that she has never smoked. She has never used smokeless tobacco. She reports that she does not drink alcohol and does not use drugs.   Physical Exam: BP 130/74   Pulse 84   Ht 4\' 11"  (1.499 m)   Wt 120 lb (54.4 kg)   BMI 24.24 kg/m   Constitutional:  Alert and oriented, No acute distress. HEENT: [place] AT, moist mucus membranes.  Trachea midline, no masses. Cardiovascular: No clubbing, cyanosis, or edema. Respiratory: Normal respiratory effort, no increased work of breathing. GI: Abdomen is soft, nontender, nondistended, no abdominal masses Skin: No rashes, bruises or suspicious lesions. Neurologic: Grossly intact, no focal deficits, moving all 4 extremities. Psychiatric: Normal mood and affect.   Assessment & Plan:    1. Chronic urinary  retention Continue annual follow-up and monthly catheter changes.   2. Recurrent UTI Increased urgency sensation/bladder spasms. Catheter was clamped today and urine will be obtained and sent for culture.  [facility] [address] [place], [place] [address] [phone]

## 2023-05-12 ENCOUNTER — Encounter: Payer: Self-pay | Admitting: Urology

## 2023-05-13 LAB — CULTURE, URINE COMPREHENSIVE

## 2023-05-14 ENCOUNTER — Telehealth: Payer: Self-pay | Admitting: Urology

## 2023-05-14 NOTE — Telephone Encounter (Signed)
Pt's daughter called back and I read message from [facility].  She wants to know if it's normal for her to have frequency.  Please call daughter at [phone]

## 2023-05-14 NOTE — Telephone Encounter (Signed)
Daughter states patient has a [employer] . So her mom feels like is is urination all the time. Advised daughter yes she could feel like that . Patient also has dementia. Daughter will call back with any questions

## 2023-06-01 ENCOUNTER — Emergency Department
Admission: EM | Admit: 2023-06-01 | Discharge: 2023-06-01 | Disposition: A | Discharge status: Home / Self Care | Payer: Medicare PPO | Attending: Emergency Medicine | Admitting: Emergency Medicine

## 2023-06-01 ENCOUNTER — Other Ambulatory Visit: Payer: Self-pay

## 2023-06-01 DIAGNOSIS — Z466 Encounter for fitting and adjustment of urinary device: Secondary | ICD-10-CM | POA: Insufficient documentation

## 2023-06-01 DIAGNOSIS — T83098D Other mechanical complication of other indwelling urethral catheter, subsequent encounter: Secondary | ICD-10-CM | POA: Diagnosis not present

## 2023-06-01 DIAGNOSIS — I10 Essential (primary) hypertension: Secondary | ICD-10-CM | POA: Diagnosis not present

## 2023-06-01 NOTE — ED Provider Notes (Signed)
   [facility] Provider Note    None    (approximate)   History   foley catheter problem   HPI  Tracy Morales is a 89 y.o. female  with PMH of chronic indwelling catheter. Patient's catheter came out today, they called home health to get it put back in and no one could get out there. They were instructed to come to the ED.      Physical Exam   Triage Vital Signs: ED Triage Vitals  Encounter Vitals Group     BP 06/01/23 1754 (!) 170/80     Systolic BP Percentile --      Diastolic BP Percentile --      Pulse Rate 06/01/23 1754 76     Resp 06/01/23 1754 16     Temp 06/01/23 1754 98 F (36.7 C)     Temp Source 06/01/23 1754 Oral     SpO2 06/01/23 1754 99 %     Weight --      Height --      Head Circumference --      Peak Flow --      Pain Score 06/01/23 1752 0     Pain Loc --      Pain Education --      Exclude from Growth Chart --     Most recent vital signs: Vitals:   06/01/23 1754  BP: (!) 170/80  Pulse: 76  Resp: 16  Temp: 98 F (36.7 C)  SpO2: 99%    General: Awake, no distress.  CV:  Good peripheral perfusion.  Resp:  Normal effort.  Abd:  No distention.  Other:     ED Results / Procedures / Treatments   Labs (all labs ordered are listed, but only abnormal results are displayed) Labs Reviewed - No data to display  PROCEDURES:  Critical Care performed: No  Procedures   MEDICATIONS ORDERED IN ED: Medications - No data to display   IMPRESSION / MDM / ASSESSMENT AND PLAN / ED COURSE  I reviewed the triage vital signs and the nursing notes.                             89 year old female present to have her catheter replaced. Patient was hypertensive in triage but does have a history of HTN. VSS stable otherwise, NAD on exam, no other complaints.  Differential diagnosis includes, but is not limited to, catheter replacement.  Patient's presentation is most consistent with acute, uncomplicated illness.  Spoke  with the triage nurse who was able to replace patient's foley catheter, draining appropriately. Patient does not have any other complaints at this time. Patient is stable for discharge.      FINAL CLINICAL IMPRESSION(S) / ED DIAGNOSES   Final diagnoses:  Encounter for Foley catheter replacement     Rx / DC Orders   ED Discharge Orders     None        Note:  This document was prepared using Dragon voice recognition software and may include unintentional dictation errors.   Cameron Ali, PA-C 06/01/23 1831    Trinna Post, MD 06/01/23 2322

## 2023-06-01 NOTE — ED Triage Notes (Signed)
Pt to ed from home via POV for foley catheter that came out. They called the company that normally changes it and they couldn't get to her to replace it so told to come to ER. Pt is cao to self at baseline.

## 2023-06-03 ENCOUNTER — Telehealth: Payer: Self-pay | Admitting: Urology

## 2023-06-03 NOTE — Telephone Encounter (Signed)
Patient's daughter called regarding order for [facility] to come to patient's  home for Cath changes. Patient had a catheter issue on 06/01/23 (catheter came out), and when she called [facility], they said they couldn't come out because they don't have a signed order from Dr. Lonna Cobb. Patient had to go to ED for catheter to be put in. Patient's daughter is requesting that we contact [facility] to reinstate order. Phone number for [facility] is [phone]

## 2023-06-03 NOTE — Telephone Encounter (Signed)
Talked to Daughter today and patient will call [facility] to get them to fax order what they would like [place] to sign off on . Daughter states she has to get signed off every couple of months. (Orders,Supplies) Give her our fax numbers .

## 2023-06-10 DIAGNOSIS — I739 Peripheral vascular disease, unspecified: Secondary | ICD-10-CM | POA: Diagnosis not present

## 2023-06-10 DIAGNOSIS — F0393 Unspecified dementia, unspecified severity, with mood disturbance: Secondary | ICD-10-CM

## 2023-06-10 DIAGNOSIS — I1310 Hypertensive heart and chronic kidney disease without heart failure, with stage 1 through stage 4 chronic kidney disease, or unspecified chronic kidney disease: Secondary | ICD-10-CM

## 2023-06-10 DIAGNOSIS — F32A Depression, unspecified: Secondary | ICD-10-CM

## 2023-06-10 DIAGNOSIS — M19011 Primary osteoarthritis, right shoulder: Secondary | ICD-10-CM

## 2023-06-10 DIAGNOSIS — D631 Anemia in chronic kidney disease: Secondary | ICD-10-CM

## 2023-06-10 DIAGNOSIS — E440 Moderate protein-calorie malnutrition: Secondary | ICD-10-CM | POA: Diagnosis not present

## 2023-06-10 DIAGNOSIS — N1832 Chronic kidney disease, stage 3b: Secondary | ICD-10-CM

## 2023-06-10 DIAGNOSIS — M160 Bilateral primary osteoarthritis of hip: Secondary | ICD-10-CM

## 2023-06-10 DIAGNOSIS — Z466 Encounter for fitting and adjustment of urinary device: Secondary | ICD-10-CM | POA: Diagnosis not present

## 2023-06-10 DIAGNOSIS — M353 Polymyalgia rheumatica: Secondary | ICD-10-CM | POA: Diagnosis not present

## 2023-06-10 DIAGNOSIS — I2510 Atherosclerotic heart disease of native coronary artery without angina pectoris: Secondary | ICD-10-CM

## 2023-06-25 ENCOUNTER — Telehealth: Payer: Self-pay | Admitting: Family Medicine

## 2023-06-25 DIAGNOSIS — R339 Retention of urine, unspecified: Secondary | ICD-10-CM

## 2023-06-25 NOTE — Telephone Encounter (Signed)
I put the referral in  Please let us know if you don't hear in 1-2 weeks   

## 2023-06-25 NOTE — Telephone Encounter (Signed)
Copied from CRM [id number]. Topic: Referral - Request for Referral >> Jun 25, 2023  9:22 AM Theodis Sato wrote: Reason for CRM: Patient is requesting  a urology referral (close to [facility])  from from Dr. Milinda Antis as her old urology provider is no longer accepting her insurance.

## 2023-06-25 NOTE — Addendum Note (Signed)
Addended by: Roxy Manns A on: 06/25/2023 02:05 PM   Modules accepted: Orders

## 2023-07-10 ENCOUNTER — Other Ambulatory Visit: Payer: Self-pay | Admitting: Family Medicine

## 2023-07-10 NOTE — Telephone Encounter (Signed)
 CPE scheduled on 07/23/23, last filled on 04/30/22 #90 tabs/ 3 refills

## 2023-07-15 ENCOUNTER — Ambulatory Visit (INDEPENDENT_AMBULATORY_CARE_PROVIDER_SITE_OTHER): Payer: Medicare PPO

## 2023-07-15 ENCOUNTER — Telehealth: Payer: Self-pay | Admitting: Family Medicine

## 2023-07-15 VITALS — Ht 62.0 in | Wt 120.0 lb

## 2023-07-15 DIAGNOSIS — R739 Hyperglycemia, unspecified: Secondary | ICD-10-CM

## 2023-07-15 DIAGNOSIS — E440 Moderate protein-calorie malnutrition: Secondary | ICD-10-CM

## 2023-07-15 DIAGNOSIS — Z79899 Other long term (current) drug therapy: Secondary | ICD-10-CM | POA: Insufficient documentation

## 2023-07-15 DIAGNOSIS — Z0000 Encounter for general adult medical examination without abnormal findings: Secondary | ICD-10-CM

## 2023-07-15 DIAGNOSIS — D500 Iron deficiency anemia secondary to blood loss (chronic): Secondary | ICD-10-CM

## 2023-07-15 DIAGNOSIS — N1832 Chronic kidney disease, stage 3b: Secondary | ICD-10-CM

## 2023-07-15 DIAGNOSIS — E7800 Pure hypercholesterolemia, unspecified: Secondary | ICD-10-CM

## 2023-07-15 DIAGNOSIS — I10 Essential (primary) hypertension: Secondary | ICD-10-CM

## 2023-07-15 NOTE — Progress Notes (Signed)
 Subjective:   Tracy Morales is a 89 y.o. female who presents for Medicare Annual (Subsequent) preventive examination.  Visit Complete: Virtual I connected with  Levern Reader on 07/15/23 by a audio enabled telemedicine application and verified that I am speaking with the correct person using two identifiers.  Patient Location: Home  Provider Location: Office/Clinic  I discussed the limitations of evaluation and management by telemedicine. The patient expressed understanding and agreed to proceed.  Vital Signs: Because this visit was a virtual/telehealth visit, some criteria may be missing or patient reported. Any vitals not documented were not able to be obtained and vitals that have been documented are patient reported.  Patient Medicare AWV questionnaire was completed by the patient on (not done); I have confirmed that all information answered by patient is correct and no changes since this date.  Cardiac Risk Factors include: advanced age (>80men, >47 women);dyslipidemia;hypertension;sedentary lifestyle    Objective:    Today's Vitals   07/15/23 0914  Weight: 120 lb (54.4 kg)  Height: 5\' 2"  (1.575 m)   Body mass index is 21.95 kg/m.     07/15/2023    9:23 AM 06/01/2023    5:54 PM 07/19/2022   10:51 AM 07/18/2021    2:52 PM 03/18/2021    4:30 PM 03/18/2021    5:51 AM 02/14/2019    5:05 AM  Advanced Directives  Does Patient Have a Medical Advance Directive? Yes No Yes Yes No No No  Type of Estate agent of [place];Living will  Healthcare Power of [place];Living will Healthcare Power of [place];Living will     Does patient want to make changes to medical advance directive?   No - Patient declined Yes (MAU/Ambulatory/Procedural Areas - Information given)     Copy of Healthcare Power of Attorney in Chart? Yes - validated most recent copy scanned in chart (See row information)  Yes - validated most recent copy scanned in chart (See row information)       Would patient like information on creating a medical advance directive?  No - Patient declined   No - Patient declined      Current Medications (verified) Outpatient Encounter Medications as of 07/15/2023  Medication Sig   acetaminophen  (TYLENOL ) 500 MG tablet Take 500 mg by mouth every 6 (six) hours as needed. OTC-UAD   amLODipine  (NORVASC ) 5 MG tablet TAKE 1 TABLET BY MOUTH EVERY DAY   aspirin 325 MG EC tablet Take 325 mg by mouth daily.   CRANBERRY PO Take 500 mg by mouth 3 (three) times daily.   famotidine  (PEPCID ) 20 MG tablet TAKE 1 TABLET BY MOUTH TWICE A DAY   feeding supplement (ENSURE ENLIVE / ENSURE PLUS) LIQD Take 237 mLs by mouth 2 (two) times daily between meals.   LORazepam  (ATIVAN ) 0.5 MG tablet Take 1 tablet (0.5 mg total) by mouth at bedtime. Caution of sedation   Meclizine HCl (BONINE PO) Take 50 mg by mouth as needed.   mirtazapine  (REMERON ) 30 MG tablet TAKE 1 TABLET BY MOUTH EVERYDAY AT BEDTIME   omeprazole  (PRILOSEC) 20 MG capsule TAKE 1 CAPSULE BY MOUTH EVERY DAY   polyethylene glycol (MIRALAX  / GLYCOLAX ) 17 g packet Take 17 g by mouth 2 (two) times daily.   psyllium (METAMUCIL) 58.6 % packet Take 1 packet by mouth daily as needed.   simvastatin  (ZOCOR ) 20 MG tablet TAKE 1 TABLET BY MOUTH EVERY DAY   vitamin C (ASCORBIC ACID) 500 MG tablet Take 500 mg by mouth  3 (three) times daily.   predniSONE  (DELTASONE ) 20 MG tablet Take 1 tablet (20 mg total) by mouth daily with breakfast. (Patient not taking: Reported on 07/15/2023)   No facility-administered encounter medications on file as of 07/15/2023.    Allergies (verified) Ace inhibitors, Donepezil , and Sulfa antibiotics   History: Past Medical History:  Diagnosis Date   Allergic rhinitis, cause unspecified    Bradycardia    Cerebrovascular disease, unspecified    Chronic kidney disease, unspecified    chronic renal insufficiency   Dizziness and giddiness    DVT (deep venous thrombosis) (HCC)    R   Dyspnea     echo with normal EF. 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 basilar insufficiency  2002   zoster  5/02   Family History  Problem Relation Age of Onset   Hypertension Father    Other Father        Cardiac problems   Heart disease Father    Other Brother        blood clot   Other Sister        benign breast nodule   Social History   Socioeconomic History   Marital status: Widowed    Spouse name: Not on file   Number of children: 2   Years of education: Not on file   Highest education level: Not on file  Occupational History   Occupation: Retired  Tobacco Use   Smoking status: Never   Smokeless tobacco: Never   Tobacco comments:    non smoker   Vaping Use    Vaping status: Never Used  Substance and Sexual Activity   Alcohol use: No    Alcohol/week: 0.0 standard drinks of alcohol   Drug use: No   Sexual activity: Never  Other Topics Concern   Not on file  Social History Narrative   Widowed. 2 children. Retired, gets regular exercise.    Social Drivers of Health   Financial Resource Strain: Low Risk  (07/15/2023)  Overall Financial Resource Strain (CARDIA)    Difficulty of Paying Living Expenses: Not hard at all  Food Insecurity: No Food Insecurity (07/15/2023)   Hunger Vital Sign    Worried About Running Out of Food in the Last Year: Never true    Ran Out of Food in the Last Year: Never true  Transportation Needs: No Transportation Needs (07/15/2023)   PRAPARE - Administrator, Civil Service (Medical): No    Lack of Transportation (Non-Medical): No  Physical Activity: Inactive (07/15/2023)   Exercise Vital Sign    Days of Exercise per Week: 0 days    Minutes of Exercise per Session: 0 min  Stress: No Stress Concern Present (07/15/2023)   [employer] of Occupational Health - Occupational Stress Questionnaire    Feeling of Stress : Not at all  Social Connections: Socially Isolated (07/15/2023)   Social Connection and Isolation Panel [NHANES]    Frequency of Communication with Friends and Family: Twice a week    Frequency of Social Gatherings with Friends and Family: More than three times a week    Attends Religious Services: Never    Database administrator or Organizations: No    Attends Banker Meetings: Never    Marital Status: Widowed    Tobacco Counseling Counseling given: Not Answered Tobacco comments: non smoker    Clinical Intake:  Pre-visit preparation completed: Yes  Pain : No/denies pain    BMI - recorded: 21.95 Nutritional Status: BMI of 19-24  Normal Nutritional Risks: None Diabetes: No  How often do you need to have someone help you when you read instructions, pamphlets, or  other written materials from your doctor or pharmacy?: 1 - Never  Interpreter Needed?: No  Comments: daughter with lives pt Information entered by :: B.Skarlet Lyons,LPN   Activities of Daily Living    07/15/2023    9:24 AM 07/19/2022   10:22 AM  In your present state of health, do you have any difficulty performing the following activities:  Hearing? 1 1  Comment  Hearing aids but does not wear them often  Vision? 1 0  Difficulty concentrating or making decisions? 1 1  Comment  Age appropriate  Walking or climbing stairs? 1 1  Comment  Wheelchair in use  Dressing or bathing? 1 1  Doing errands, shopping? 1 1  Preparing Food and eating ? Y Y  Comment  Family assist with meal prep. Self feeds.  Using the Toilet? Y N  In the past six months, have you accidently leaked urine? Y N  Comment  Permanent catheter  Do you have problems with loss of bowel control? Colie Dawes  Comment  Wears brief  Managing your Medications? Colie Dawes  Comment  Family assist  Managing your Finances? Colie Dawes  Comment  Family assist  Housekeeping or managing your Housekeeping? Colie Dawes  Comment  Family assist    Patient Care Team: Tower, Manley Seeds, MD as PCP - General Arminda Berth, [place] as Consulting Physician (Optometry) Rica Chalet, MD as Consulting Physician (Internal Medicine) Rea Cambridge, MD as Consulting Physician (Urology) Clemetine Cypher, [place] as Consulting Physician (Podiatry) Alvino Aye, MD as Referring Physician (Audiology)  Indicate any recent Medical Services you may have received from other than [facility] providers in the past year (date may be approximate).     Assessment:   This is a routine wellness examination for [place].  Hearing/Vision screen Hearing Screening - Comments:: Pt says she has trouble  sometimes hearing Vision Screening - Comments:: Pt says she has glasses but does not wear them No eye exams   Goals Addressed             This Visit's Progress    Community resource needs    On track    Care Coordination Interventions: Patient currently followed by Palliative Care Applying for Aid and Attend[facility] discussed-contact information provided [phone]-patient's daughter will provide contact information to her son who can follow up Private duty care discussed including anticipated cost Daughter to continue to utilize [employer] [place] for incontinent supplies Caregiver stress acknowledged -self care emphasized       Follow up with Primary Care Provider   On track    Starting 05/06/2018, I will continue to take medications as prescribed and to follow up with primary care provider as directed.      Patient Stated       Pt says she just wants to keep on living       Depression Screen    07/15/2023    9:20 AM 04/02/2023   11:25 AM 02/14/2023    4:06 PM 07/19/2022   10:47 AM 11/10/2021   12:14 PM 07/18/2021    2:56 PM 05/20/2020   10:04 AM  PHQ 2/9 Scores  PHQ - 2 Score 1 6 4  0 0 2 0  PHQ- 9 Score  19 14   3      Fall Risk    07/15/2023    9:17 AM 04/02/2023   11:25 AM 02/14/2023    4:06 PM 07/19/2022   10:51 AM 11/10/2021   12:13 PM  Fall Risk   Falls in the past year? 0 0 0 0 0  Number falls in past yr: 0 0 0 0   Injury with Fall? 0 0 0 0   Risk for fall due to : No Fall Risks No Fall Risks No Fall Risks    Follow up Education provided;Falls prevention discussed Falls evaluation completed Falls evaluation completed Falls evaluation completed;Falls prevention discussed Falls evaluation completed    MEDICARE RISK AT HOME: Medicare Risk at Home Any stairs in or around the home?: No If so, are there any without handrails?: No Home free of loose throw rugs in walkways, pet beds, electrical cords, etc?: Yes Adequate lighting in your home to reduce risk of falls?: Yes Life alert?: No Use of a cane, walker or w/c?: Yes (w/c) Grab bars in the bathroom?: No Shower chair or bench in shower?: No Elevated toilet seat or a  handicapped toilet?: No  TIMED UP AND GO:  Was the test performed?  No    Cognitive Function:    05/06/2018   11:23 AM 05/02/2017   11:00 AM 04/19/2016    2:40 PM  MMSE - Mini Mental State Exam  Orientation to time 5 5 5   Orientation to Place 5 5 5   Registration 3 3 3   Attention/ Calculation 0 0 0  Recall 1 1 3   Recall-comments unable to recall 2 of 3 words; was able to recall after cues given unable  to recall 2 of 3 words   Language- name 2 objects 0 0 0  Language- repeat 1 1 1   Language- follow 3 step command 3 3 3   Language- read & follow direction 0 0 0  Write a sentence 0 0 0  Copy design 0 0 0  Total score 18 18 20  07/15/2023    9:25 AM 07/19/2022   10:54 AM  6CIT Screen  What Year? 4 points 0 points  What month? 0 points 0 points  What time? 3 points 0 points  Count back from 20 4 points   Months in reverse 4 points   Repeat phrase 10 points   Total Score 25 points     Immunizations Immunization History  Administered Date(s) Administered   Fluad Quad(high Dose 65+) 02/20/2019, 02/17/2020, 02/17/2021, 02/27/2022   Influenza Split 03/08/2011, 02/25/2012   Influenza Whole 05/05/2004, 02/27/2008, 03/03/2009, 02/24/2010   Influenza, Mdck, Trivalent,PF 6+ MOS(egg free) 02/14/2023   Influenza,inj,Quad PF,6+ Mos 02/25/2013, 02/01/2014, 03/10/2015, 03/05/2016, 02/19/2017, 02/28/2018   PFIZER Comirnaty(Gray Top)Covid-19 Tri-Sucrose Vaccine 09/13/2020   PFIZER(Purple Top)SARS-COV-2 Vaccination 07/19/2019, 08/18/2019, 04/07/2020   PNEUMOCOCCAL CONJUGATE-20 02/14/2023   PPD Test 06/09/2014   Pfizer Covid-19 Vaccine Bivalent Booster 72yrs & up 03/09/2021   Pfizer(Comirnaty)Fall Seasonal Vaccine 12 years and older 03/15/2022, 02/14/2023   Pneumococcal Conjugate-13 10/30/2013   Pneumococcal Polysaccharide-23 05/06/2002   Td 05/06/2002   Tdap 10/11/2011   Zoster Recombinant(Shingrix) 06/24/2018, 07/24/2022    TDAP status: Up to date  Flu Vaccine status: Up  to date  Pneumococcal vaccine status: Up to date  Covid-19 vaccine status: Completed vaccines  Qualifies for Shingles Vaccine? Yes   Zostavax completed Yes   Shingrix Completed?: Yes  Screening Tests Health Maintenance  Topic Date Due   DTaP/Tdap/Td (3 - Td or Tdap) 10/10/2021   Medicare Annual Wellness (AWV)  07/14/2024   Pneumonia Vaccine 67+ Years old  Completed   INFLUENZA VACCINE  Completed   DEXA SCAN  Completed   COVID-19 Vaccine  Completed   Zoster Vaccines- Shingrix  Completed   HPV VACCINES  Aged Out    Health Maintenance  Health Maintenance Due  Topic Date Due   DTaP/Tdap/Td (3 - Td or Tdap) 10/10/2021    Colorectal cancer screening: No longer required.   Mammogram status: No longer required due to age.  Lung Cancer Screening: (Low Dose CT Chest recommended if Age 35-80 years, 20 pack-year currently smoking OR have quit w/in 15years.) does not qualify.   Lung Cancer Screening Referral: no  Additional Screening:  Hepatitis C Screening: does not qualify; Completed no  Vision Screening: Recommended annual ophthalmology exams for early detection of glaucoma and other disorders of the eye. Is the patient up to date with their annual eye exam?  No  Who is the provider or what is the name of the office in which the patient attends annual eye exams? none If pt is not established with a provider, would they like to be referred to a provider to establish care? No .   Dental Screening: Recommended annual dental exams for proper oral hygiene  Diabetic Foot Exam: n/a  Community Resource Referral / Chronic Care Management: CRR required this visit?  No   CCM required this visit?  No    Plan:     I have personally reviewed and noted the following in the patient's chart:   Medical and social history Use of alcohol, tobacco or illicit drugs  Current medications and supplements including opioid prescriptions. Patient is not currently taking opioid  prescriptions. Functional ability and status Nutritional status Physical activity Advanced directives List of other physicians Hospitalizations, surgeries, and ER visits in previous 12 months Vitals Screenings to include cognitive, depression, and falls Referrals and appointments  In addition, I have reviewed and discussed with patient certain preventive protocols, quality metrics, and best practice  recommendations. A written personalized care plan for preventive services as well as general preventive health recommendations were provided to patient.    Nerissa Bannister, LPN   [id number]/52/[id number]   After Visit Summary: (Declined) Due to this being a telephonic visit, with patients personalized plan was offered to patient but patient Declined AVS at this time   Nurse Notes: Pt awvisit conducted via speaker with her daughter/caretaker, who lives with her. Pt states she is doing alright. They have no questions or concerns at this time.  Patient declined referral to establish with an ophthalmologist.

## 2023-07-15 NOTE — Patient Instructions (Signed)
 Tracy Morales , Thank you for taking time to come for your Medicare Wellness Visit. I appreciate your ongoing commitment to your health goals. Please review the following plan we discussed and let me know if I can assist you in the future.   Referrals/Orders/Follow-Ups/Clinician Recommendations: none  This is a list of the screening recommended for you and due dates:  Health Maintenance  Topic Date Due   DTaP/Tdap/Td vaccine (3 - Td or Tdap) 10/10/2021   Medicare Annual Wellness Visit  07/14/2024   Pneumonia Vaccine  Completed   Flu Shot  Completed   DEXA scan (bone density measurement)  Completed   COVID-19 Vaccine  Completed   Zoster (Shingles) Vaccine  Completed   HPV Vaccine  Aged Out    Advanced directives: (In Chart) A copy of your advanced directives are scanned into your chart should your provider ever need it.  Next Medicare Annual Wellness Visit scheduled for next year: Yes 07/15/2024 @ 9:30am phone visit

## 2023-07-15 NOTE — Telephone Encounter (Signed)
-----   Message from Gerry Krone sent at 06/27/2023 10:47 AM EST ----- Regarding: lab orders for Tue, 2.11.25 Patient is scheduled for CPX labs, please order future labs, Thanks , Anselmo Kings

## 2023-07-16 ENCOUNTER — Other Ambulatory Visit (INDEPENDENT_AMBULATORY_CARE_PROVIDER_SITE_OTHER): Payer: Medicare PPO

## 2023-07-16 DIAGNOSIS — N1832 Chronic kidney disease, stage 3b: Secondary | ICD-10-CM

## 2023-07-16 DIAGNOSIS — Z79899 Other long term (current) drug therapy: Secondary | ICD-10-CM | POA: Diagnosis not present

## 2023-07-16 DIAGNOSIS — I10 Essential (primary) hypertension: Secondary | ICD-10-CM | POA: Diagnosis not present

## 2023-07-16 DIAGNOSIS — R739 Hyperglycemia, unspecified: Secondary | ICD-10-CM | POA: Diagnosis not present

## 2023-07-16 DIAGNOSIS — E7800 Pure hypercholesterolemia, unspecified: Secondary | ICD-10-CM

## 2023-07-16 DIAGNOSIS — E440 Moderate protein-calorie malnutrition: Secondary | ICD-10-CM

## 2023-07-16 DIAGNOSIS — D500 Iron deficiency anemia secondary to blood loss (chronic): Secondary | ICD-10-CM | POA: Diagnosis not present

## 2023-07-16 LAB — CBC WITH DIFFERENTIAL/PLATELET
Basophils Absolute: 0 10*3/uL (ref 0.0–0.1)
Basophils Relative: 0.9 % (ref 0.0–3.0)
Eosinophils Absolute: 0.1 10*3/uL (ref 0.0–0.7)
Eosinophils Relative: 2.2 % (ref 0.0–5.0)
HCT: 41 % (ref 36.0–46.0)
Hemoglobin: 13.3 g/dL (ref 12.0–15.0)
Lymphocytes Relative: 36.3 % (ref 12.0–46.0)
Lymphs Abs: 1.6 10*3/uL (ref 0.7–4.0)
MCHC: 32.3 g/dL (ref 30.0–36.0)
MCV: 90.9 fL (ref 78.0–100.0)
Monocytes Absolute: 0.6 10*3/uL (ref 0.1–1.0)
Monocytes Relative: 13.3 % — ABNORMAL HIGH (ref 3.0–12.0)
Neutro Abs: 2 10*3/uL (ref 1.4–7.7)
Neutrophils Relative %: 47.3 % (ref 43.0–77.0)
Platelets: 244 10*3/uL (ref 150.0–400.0)
RBC: 4.51 Mil/uL (ref 3.87–5.11)
RDW: 14.7 % (ref 11.5–15.5)
WBC: 4.3 10*3/uL (ref 4.0–10.5)

## 2023-07-16 LAB — LIPID PANEL
Cholesterol: 192 mg/dL (ref 0–200)
HDL: 41.2 mg/dL (ref >39.00)
LDL Cholesterol: 130 mg/dL — ABNORMAL HIGH (ref 0–99)
NonHDL: 150.72
Total CHOL/HDL Ratio: 5
Triglycerides: 105 mg/dL (ref 0.0–149.0)
VLDL: 21 mg/dL (ref 0.0–40.0)

## 2023-07-16 LAB — VITAMIN B12: Vitamin B-12: 310 pg/mL (ref 211–911)

## 2023-07-16 LAB — COMPREHENSIVE METABOLIC PANEL
ALT: 10 U/L (ref 0–35)
AST: 21 U/L (ref 0–37)
Albumin: 3.4 g/dL — ABNORMAL LOW (ref 3.5–5.2)
Alkaline Phosphatase: 137 U/L — ABNORMAL HIGH (ref 39–117)
BUN: 23 mg/dL (ref 6–23)
CO2: 29 meq/L (ref 19–32)
Calcium: 8.6 mg/dL (ref 8.4–10.5)
Chloride: 101 meq/L (ref 96–112)
Creatinine, Ser: 1.54 mg/dL — ABNORMAL HIGH (ref 0.40–1.20)
GFR: 27.29 mL/min — ABNORMAL LOW (ref >60.00)
Glucose, Bld: 132 mg/dL — ABNORMAL HIGH (ref 70–99)
Potassium: 4.2 meq/L (ref 3.5–5.1)
Sodium: 138 meq/L (ref 135–145)
Total Bilirubin: 0.3 mg/dL (ref 0.2–1.2)
Total Protein: 6.6 g/dL (ref 6.0–8.3)

## 2023-07-16 LAB — VITAMIN D 25 HYDROXY (VIT D DEFICIENCY, FRACTURES): VITD: 78.86 ng/mL (ref 30.00–100.00)

## 2023-07-16 LAB — IRON: Iron: 52 ug/dL (ref 42–145)

## 2023-07-16 LAB — TSH: TSH: 3.31 u[IU]/mL (ref 0.35–5.50)

## 2023-07-16 LAB — HEMOGLOBIN A1C: Hgb A1c MFr Bld: 5.7 % (ref 4.6–6.5)

## 2023-07-23 ENCOUNTER — Ambulatory Visit (INDEPENDENT_AMBULATORY_CARE_PROVIDER_SITE_OTHER): Payer: Medicare PPO | Admitting: Family Medicine

## 2023-07-23 ENCOUNTER — Encounter: Payer: Self-pay | Admitting: Family Medicine

## 2023-07-23 VITALS — BP 130/76 | HR 70 | Temp 97.6°F

## 2023-07-23 DIAGNOSIS — N184 Chronic kidney disease, stage 4 (severe): Secondary | ICD-10-CM

## 2023-07-23 DIAGNOSIS — L89309 Pressure ulcer of unspecified buttock, unspecified stage: Secondary | ICD-10-CM

## 2023-07-23 DIAGNOSIS — F03B18 Unspecified dementia, moderate, with other behavioral disturbance: Secondary | ICD-10-CM | POA: Diagnosis not present

## 2023-07-23 DIAGNOSIS — Z0000 Encounter for general adult medical examination without abnormal findings: Secondary | ICD-10-CM | POA: Diagnosis not present

## 2023-07-23 DIAGNOSIS — E7800 Pure hypercholesterolemia, unspecified: Secondary | ICD-10-CM | POA: Diagnosis not present

## 2023-07-23 DIAGNOSIS — R7303 Prediabetes: Secondary | ICD-10-CM

## 2023-07-23 DIAGNOSIS — I739 Peripheral vascular disease, unspecified: Secondary | ICD-10-CM

## 2023-07-23 DIAGNOSIS — Z79899 Other long term (current) drug therapy: Secondary | ICD-10-CM

## 2023-07-23 DIAGNOSIS — Z7409 Other reduced mobility: Secondary | ICD-10-CM

## 2023-07-23 DIAGNOSIS — E440 Moderate protein-calorie malnutrition: Secondary | ICD-10-CM

## 2023-07-23 DIAGNOSIS — R54 Age-related physical debility: Secondary | ICD-10-CM

## 2023-07-23 DIAGNOSIS — D500 Iron deficiency anemia secondary to blood loss (chronic): Secondary | ICD-10-CM | POA: Diagnosis not present

## 2023-07-23 DIAGNOSIS — I10 Essential (primary) hypertension: Secondary | ICD-10-CM | POA: Diagnosis not present

## 2023-07-23 DIAGNOSIS — K219 Gastro-esophageal reflux disease without esophagitis: Secondary | ICD-10-CM

## 2023-07-23 NOTE — Assessment & Plan Note (Signed)
Continue to encourage good fluid and protein intake This is a struggle with advanced age Takes mirtazapine to help appetite and mood

## 2023-07-23 NOTE — Assessment & Plan Note (Signed)
Cbd is normal today Reassuring

## 2023-07-23 NOTE — Assessment & Plan Note (Signed)
Lab Results  Component Value Date   VITAMINB12 310 07/16/2023   Last vitamin D Lab Results  Component Value Date   VD25OH 78.86 07/16/2023

## 2023-07-23 NOTE — Assessment & Plan Note (Signed)
GFR of 27.2 noted  Has seen nephrology and declines dialysis at her age (agree)  Family tries to encourage fluids but his is difficult at her age   No symptoms

## 2023-07-23 NOTE — Assessment & Plan Note (Signed)
Had more agitation at night (benzo did not help)  Continues mirtazapine Melatonin has helped significantly and will continue this  Will continue to monitor  Aware that other meds for agitation may cause increase in mortality risk

## 2023-07-23 NOTE — Assessment & Plan Note (Signed)
Continues amlodipine  No complaints Good foot exam today/ seem warm and well perfused

## 2023-07-23 NOTE — Assessment & Plan Note (Signed)
Has never really walked after her femur fracture Issues with bed sores noted

## 2023-07-23 NOTE — Assessment & Plan Note (Signed)
Reviewed health habits including diet and exercise and skin cancer prevention Reviewed appropriate screening tests for age  Also reviewed health mt list, fam hx and immunization status , as well as social and family history   See HPI Labs reviewed and ordered Health Maintenance  Topic Date Due   DTaP/Tdap/Td vaccine (3 - Td or Tdap) 10/10/2021   COVID-19 Vaccine (8 - 2024-25 season) 08/07/2024*   Medicare Annual Wellness Visit  07/14/2024   Pneumonia Vaccine  Completed   Flu Shot  Completed   DEXA scan (bone density measurement)  Completed   Zoster (Shingles) Vaccine  Completed   HPV Vaccine  Aged Out  *Topic was postponed. The date shown is not the original due date.   Declines dexa  No breast or colon cancer screen at advanced age Due for Td if she wants to get at pharmacy Discussed fall prevention, supplements and exercise for bone density  Focus on quality of live currently  High PHQ- aware and being treated  Melatonin has helped sleep and agitation

## 2023-07-23 NOTE — Progress Notes (Signed)
Subjective:    Patient ID: Tracy Morales, female    DOB: 05/13/1920, 89 y.o.   MRN: [id number]  HPI  Here for health maintenance exam and to review chronic medical problems   Wt Readings from Last 3 Encounters:  07/15/23 120 lb (54.4 kg)  05/10/23 120 lb (54.4 kg)  07/19/22 120 lb (54.4 kg)      Vitals:   07/23/23 1053  BP: 130/76  Pulse: 70  Temp: 97.6 F (36.4 C)  SpO2: 98%    Immunization History  Administered Date(s) Administered   Fluad Quad(high Dose 65+) 02/20/2019, 02/17/2020, 02/17/2021, 02/27/2022   Influenza Split 03/08/2011, 02/25/2012   Influenza Whole 05/05/2004, 02/27/2008, 03/03/2009, 02/24/2010   Influenza, Mdck, Trivalent,PF 6+ MOS(egg free) 02/14/2023   Influenza,inj,Quad PF,6+ Mos 02/25/2013, 02/01/2014, 03/10/2015, 03/05/2016, 02/19/2017, 02/28/2018   PFIZER Comirnaty(Gray Top)Covid-19 Tri-Sucrose Vaccine 09/13/2020   PFIZER(Purple Top)SARS-COV-2 Vaccination 07/19/2019, 08/18/2019, 04/07/2020   PNEUMOCOCCAL CONJUGATE-20 02/14/2023   PPD Test 06/09/2014   Pfizer Covid-19 Vaccine Bivalent Booster 11yrs & up 03/09/2021   Pfizer(Comirnaty)Fall Seasonal Vaccine 12 years and older 03/15/2022, 02/14/2023   Pneumococcal Conjugate-13 10/30/2013   Pneumococcal Polysaccharide-23 05/06/2002   Td 05/06/2002   Tdap 10/11/2011   Zoster Recombinant(Shingrix) 06/24/2018, 07/24/2022    There are no preventive care reminders to display for this patient.   Due for a tetanus shot /not high   Mammogram declines due to age/last 2015 Self breast exam- no lumps    Bone health  Dexa  2001 osteopenia  Falls-none  Fractures- history of femur fracture in 2023  Supplements  Last vitamin D Lab Results  Component Value Date   VD25OH 78.86 07/16/2023    Exercise -little to none    Mood    07/23/2023   10:57 AM 07/15/2023    9:20 AM 04/02/2023   11:25 AM 02/14/2023    4:06 PM 07/19/2022   10:47 AM  Depression screen PHQ 2/9  Decreased Interest 2 1 3 2  0   Down, Depressed, Hopeless 2 0 3 2 0  PHQ - 2 Score 4 1 6 4  0  Altered sleeping 1  3 3    Tired, decreased energy 3  3 3    Change in appetite 2  3 0   Feeling bad or failure about yourself  1  2 1    Trouble concentrating 1  1 2    Moving slowly or fidgety/restless 1  1 1    Suicidal thoughts 1  0 0   PHQ-9 Score 14  19 14    Difficult doing work/chores   Somewhat difficult Somewhat difficult   Some dementia in advanced age  Takes mirtazapine 30 mg for appetite/ mood and sleep  Gets down at time   Melatonin helps agitation at night   Eats bkfast and lunch   More issues with bed sores (is on her bottom all day until evening)     HTN  (in setting with known carotid stenosis, CV dz, PVD, and DD)  bp is stable today  No cp or palpitations or headaches or edema  No side effects to medicines  BP Readings from Last 3 Encounters:  07/23/23 130/76  06/01/23 (!) 170/80  05/10/23 130/74    Amlodipine 5 mg daily   Lab Results  Component Value Date   NA 138 07/16/2023   K 4.2 07/16/2023   CO2 29 07/16/2023   GLUCOSE 132 (H) 07/16/2023   BUN 23 07/16/2023   CREATININE 1.54 (H) 07/16/2023   CALCIUM 8.6 07/16/2023  GFR 27.29 (L) 07/16/2023   GFRNONAA 31 (L) 03/19/2021   CKD 3b  Sees urology for urinary retention  Has history of utis  Not being aggressive  Sees Dr Thedore Mins prn (nothing more to do/ no need) -wants to avoid dialysis   Refuses to drink    No infection in December (urine)    GERD Pepcid 20 mg bid (prefers pepcid ac)  Omeprazole 20 mg daily   Hyperlipidemia  Lab Results  Component Value Date   CHOL 192 07/16/2023   CHOL 167 07/13/2022   CHOL 174 07/19/2021   Lab Results  Component Value Date   HDL 41.20 07/16/2023   HDL 35.50 (L) 07/13/2022   HDL 44.80 07/19/2021   Lab Results  Component Value Date   LDLCALC 130 (H) 07/16/2023   LDLCALC 114 (H) 07/13/2022   LDLCALC 116 (H) 07/19/2021   Lab Results  Component Value Date   TRIG 105.0 07/16/2023    TRIG 90.0 07/13/2022   TRIG 66.0 07/19/2021   Lab Results  Component Value Date   CHOLHDL 5 07/16/2023   CHOLHDL 5 07/13/2022   CHOLHDL 4 07/19/2021   No results found for: "LDLDIRECT" Tolerates simvastatin 20 mg daily    Glucose Lab Results  Component Value Date   HGBA1C 5.7 07/16/2023   HGBA1C 5.8 07/13/2022   HGBA1C 6.0 07/19/2021   Lab Results  Component Value Date   VITAMINB12 310 07/16/2023   Lab Results  Component Value Date   TSH 3.31 07/16/2023    Lab Results  Component Value Date   WBC 4.3 07/16/2023   HGB 13.3 07/16/2023   HCT 41.0 07/16/2023   MCV 90.9 07/16/2023   PLT 244.0 07/16/2023     Patient Active Problem List   Diagnosis Date Noted   Current use of proton pump inhibitor 07/15/2023   GERD (gastroesophageal reflux disease) 07/20/2022   Advanced age 70/31/2023   Heartburn 08/16/2021   Dysthymia 07/19/2021   Hematoma 04/19/2021   Urinary retention 04/11/2021   Bed sore 04/11/2021   Iron deficiency anemia 04/11/2021   Malnutrition of moderate degree 03/20/2021   ABLA 03/19/2021   DNR (do not resuscitate) 03/18/2021   History of femur fracture    Pincer nail deformity 07/04/2020   Seborrheic dermatitis of scalp 08/27/2019   Prediabetes 05/11/2019   Pain due to onychomycosis of toenails of both feet 12/04/2018   Recurrent UTI 09/22/2018   Low back pain 09/15/2018   Dementia (HCC) 05/11/2018   Mobility impaired 05/06/2017   Osteoarthritis of both hips 04/15/2015   Routine general medical examination at a health care facility 04/12/2015   Hair loss 11/19/2014   Arthritis of right shoulder region 06/09/2014   Nodule of left lung 06/09/2014   Encounter for Medicare annual wellness exam 10/30/2013   H/O fall 04/27/2013   Risk for falls 04/11/2012   MICROSCOPIC HEMATURIA 08/29/2009   CONDUCTIVE HEARING LOSS BILATERAL 08/08/2009   EDEMA 04/07/2009   BRADYCARDIA 10/26/2008   DYSPNEA 10/26/2008   INCONTINENCE, URGE 02/27/2008    Hyperlipidemia 10/11/2006   Mononeuritis of lower limb 10/11/2006   Essential hypertension 10/11/2006   DIASTOLIC DYSFUNCTION 10/11/2006   CAROTID STENOSIS 10/11/2006   CEREBROVASCULAR DISEASE 10/11/2006   PERIPHERAL VASCULAR DISEASE 10/11/2006   Allergic rhinitis 10/11/2006   CKD (chronic kidney disease), stage IV (HCC) 10/11/2006   OVERACTIVE BLADDER 10/11/2006   Osteoarthritis 10/11/2006   History of cardiovascular disorder 10/11/2006   Past Medical History:  Diagnosis Date   Allergic rhinitis, cause unspecified  Bradycardia    Cerebrovascular disease, unspecified    Chronic kidney disease, unspecified    chronic renal insufficiency   Dizziness and giddiness    DVT (deep venous thrombosis) (HCC)    R   Dyspnea    echo with normal EF. 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 basilar insufficiency  2002   zoster  5/02   Social History   Tobacco Use   Smoking status: Never   Smokeless tobacco: Never   Tobacco comments:    non smoker   Vaping Use   Vaping status: Never Used  Substance Use Topics   Alcohol use: No    Alcohol/week: 0.0 standard drinks of alcohol   Drug use: No   Family History  Problem Relation Age of Onset   Hypertension Father    Other Father        Cardiac problems   Heart disease Father    Other Brother        blood clot   Other Sister        benign breast nodule   Allergies  Allergen Reactions   Ace Inhibitors     REACTION: cough   Donepezil Diarrhea and Other (See Comments)    GI problems Other reaction(s): Other (See Comments) GI problems   Sulfa Antibiotics Nausea Only    headache   Current Outpatient Medications on File Prior to Visit  Medication Sig Dispense Refill   acetaminophen (TYLENOL) 500 MG tablet Take 500 mg by mouth every 6 (six) hours as needed. OTC-UAD     amLODipine (NORVASC) 5 MG tablet TAKE 1 TABLET BY MOUTH EVERY DAY 90 tablet 1   aspirin 325 MG EC tablet Take 325 mg by mouth daily.     CRANBERRY PO Take 500 mg by mouth 3 (three) times daily.     famotidine (PEPCID) 10 MG tablet Take 10 mg by mouth at bedtime.     feeding supplement (ENSURE ENLIVE / ENSURE PLUS) LIQD Take 237 mLs  by mouth 2 (two) times daily between meals. 237 mL 12   Meclizine HCl (BONINE PO) Take 50 mg by mouth as needed.     Melatonin 10 MG TABS Take 10 mg by mouth at bedtime.     mirtazapine (REMERON) 30 MG tablet TAKE 1 TABLET BY MOUTH EVERYDAY AT BEDTIME 90 tablet 0   omeprazole (PRILOSEC) 20 MG capsule TAKE 1 CAPSULE BY MOUTH EVERY DAY 90 capsule 1   polyethylene glycol (MIRALAX / GLYCOLAX) 17 g packet Take 17 g by mouth 2 (two) times daily. 14 each 0   psyllium (METAMUCIL) 58.6 % packet Take 1 packet by mouth  daily as needed.     simvastatin (ZOCOR) 20 MG tablet TAKE 1 TABLET BY MOUTH EVERY DAY 90 tablet 1   vitamin C (ASCORBIC ACID) 500 MG tablet Take 500 mg by mouth 3 (three) times daily.     No current facility-administered medications on file prior to visit.    Review of Systems  Constitutional:  Positive for fatigue. Negative for activity change, appetite change, fever and unexpected weight change.  HENT:  Negative for congestion, ear pain, rhinorrhea, sinus pressure and sore throat.   Eyes:  Negative for pain, redness and visual disturbance.  Respiratory:  Negative for cough, shortness of breath and wheezing.   Cardiovascular:  Negative for chest pain and palpitations.  Gastrointestinal:  Negative for abdominal pain, blood in stool, constipation and diarrhea.  Endocrine: Negative for polydipsia and polyuria.  Genitourinary:  Positive for difficulty urinating and urgency. Negative for dysuria and frequency.  Musculoskeletal:  Positive for arthralgias, back pain and gait problem. Negative for myalgias.  Skin:  Positive for wound. Negative for pallor and rash.  Allergic/Immunologic: Negative for environmental allergies.  Neurological:  Negative for dizziness, syncope and headaches.  Hematological:  Negative for adenopathy. Does not bruise/bleed easily.  Psychiatric/Behavioral:  Negative for decreased concentration and dysphoric mood. The patient is not nervous/anxious.        Objective:   Physical Exam Constitutional:      General: She is not in acute distress.    Appearance: Normal appearance. She is well-developed. She is not ill-appearing or diaphoretic.  HENT:     Head: Normocephalic and atraumatic.     Right Ear: Tympanic membrane, ear canal and external ear normal.     Left Ear: Tympanic membrane, ear canal and external ear normal.     Nose: Nose normal. No congestion.     Mouth/Throat:     Mouth: Mucous membranes are moist.     Pharynx: Oropharynx is clear. No posterior  oropharyngeal erythema.  Eyes:     General: No scleral icterus.    Extraocular Movements: Extraocular movements intact.     Conjunctiva/sclera: Conjunctivae normal.     Pupils: Pupils are equal, round, and reactive to light.  Neck:     Thyroid: No thyromegaly.     Vascular: No carotid bruit or JVD.  Cardiovascular:     Rate and Rhythm: Normal rate and regular rhythm.     Pulses: Normal pulses.     Heart sounds: Normal heart sounds.     No gallop.     Comments: Pedal pulses are difficult to palpate but feet are warm Pulmonary:     Effort: Pulmonary effort is normal. No respiratory distress.     Breath sounds: Normal breath sounds. No wheezing.     Comments: Good air exch Chest:     Chest wall: No tenderness.  Abdominal:  General: Bowel sounds are normal. There is no distension or abdominal bruit.     Palpations: Abdomen is soft. There is no mass.     Tenderness: There is no abdominal tenderness.     Hernia: No hernia is present.  Genitourinary:    Comments:     Musculoskeletal:        General: No tenderness. Normal range of motion.     Cervical back: Normal range of motion and neck supple. No rigidity. No muscular tenderness.     Right lower leg: No edema.     Left lower leg: No edema.     Comments: Mild to moderate  kyphosis   Lymphadenopathy:     Cervical: No cervical adenopathy.  Skin:    General: Skin is warm and dry.     Coloration: Skin is not pale.     Findings: No erythema or rash.     Comments: Toe nails are curved under but well taken care of   Unable to position pt to see sacral ulcers today   Neurological:     Mental Status: She is alert. Mental status is at baseline.     Cranial Nerves: No cranial nerve deficit.     Motor: No abnormal muscle tone.     Coordination: Coordination normal.     Gait: Gait normal.     Deep Tendon Reflexes: Reflexes are normal and symmetric.  Psychiatric:        Mood and Affect: Mood normal.        Cognition and Memory:  Cognition and memory normal.           Assessment & Plan:   Problem List Items Addressed This Visit       Cardiovascular and Mediastinum   PERIPHERAL VASCULAR DISEASE   Continues amlodipine  No complaints Good foot exam today/ seem warm and well perfused       Essential hypertension   bp in fair control at this time  BP Readings from Last 1 Encounters:  07/23/23 130/76   No changes needed Most recent labs reviewed  Is sedentary at age 37 , focus on quality of life  Labs ordered Plan to continue amlodipine 5 mg daily (tolerates well)         Digestive   GERD (gastroesophageal reflux disease)   Prefers pepcid ac to prescription pepcid  Chews one each evening  Continues omeprazole 20 mg daily        Relevant Medications   famotidine (PEPCID) 10 MG tablet     Nervous and Auditory   Dementia (HCC)   Had more agitation at night (benzo did not help)  Continues mirtazapine Melatonin has helped significantly and will continue this  Will continue to monitor  Aware that other meds for agitation may cause increase in mortality risk        Genitourinary   CKD (chronic kidney disease), stage IV (HCC)   GFR of 27.2 noted  Has seen nephrology and declines dialysis at her age (agree)  Family tries to encourage fluids but his is difficult at her age   No symptoms         Other   Routine general medical examination at a health care facility - Primary   Reviewed health habits including diet and exercise and skin cancer prevention Reviewed appropriate screening tests for age  Also reviewed health mt list, fam hx and immunization status , as well as social and family history   See HPI Labs reviewed  and ordered Health Maintenance  Topic Date Due   DTaP/Tdap/Td vaccine (3 - Td or Tdap) 10/10/2021   COVID-19 Vaccine (8 - 2024-25 season) 08/07/2024*   Medicare Annual Wellness Visit  07/14/2024   Pneumonia Vaccine  Completed   Flu Shot  Completed   DEXA scan (bone  density measurement)  Completed   Zoster (Shingles) Vaccine  Completed   HPV Vaccine  Aged Out  *Topic was postponed. The date shown is not the original due date.   Declines dexa  No breast or colon cancer screen at advanced age Due for Td if she wants to get at pharmacy Discussed fall prevention, supplements and exercise for bone density  Focus on quality of live currently  High PHQ- aware and being treated  Melatonin has helped sleep and agitation       Prediabetes   Lab Results  Component Value Date   HGBA1C 5.7 07/16/2023   HGBA1C 5.8 07/13/2022   HGBA1C 6.0 07/19/2021   Reassuring  Encouraged intake of protein calories when able       Mobility impaired   Has never really walked after her femur fracture Issues with bed sores noted        Relevant Orders   Ambulatory referral to Home Health   Malnutrition of moderate degree   Continue to encourage good fluid and protein intake This is a struggle with advanced age Takes mirtazapine to help appetite and mood       Iron deficiency anemia   Cbd is normal today Reassuring       Hyperlipidemia   Disc goals for lipids and reasons to control them Rev last labs with pt Rev low sat fat diet in detail  Continues low dose simvastatin  In setting of known vascular dz   Continue to focus on quality of life       Current use of proton pump inhibitor   Lab Results  Component Value Date   VITAMINB12 310 07/16/2023   Last vitamin D Lab Results  Component Value Date   VD25OH 78.86 07/16/2023         Bed sore   Per family small and superficial  Would appreciate HH ref for nurse/wound care help   Encouraged use of barrier cream Avoiding adhesives due to thin skin  Encouraged change in position when possible  Discussed signs and symptoms of infection to watch for       Relevant Orders   Ambulatory referral to Home Health   Advanced age   Trying to focus on quality of life as much as possible

## 2023-07-23 NOTE — Assessment & Plan Note (Signed)
Lab Results  Component Value Date   HGBA1C 5.7 07/16/2023   HGBA1C 5.8 07/13/2022   HGBA1C 6.0 07/19/2021   Reassuring  Encouraged intake of protein calories when able

## 2023-07-23 NOTE — Assessment & Plan Note (Signed)
Per family small and superficial  Would appreciate HH ref for nurse/wound care help   Encouraged use of barrier cream Avoiding adhesives due to thin skin  Encouraged change in position when possible  Discussed signs and symptoms of infection to watch for

## 2023-07-23 NOTE — Patient Instructions (Addendum)
Desitin or A and D ointment (barrier cream) - best for bed sores to protect them  Keep areas as clean as possible  A non stick pad on that will help but may not stay put   I will place a home health order for nursing to help with wounds/bedsores  If you don't get a call let me know   Encourage fluids when able

## 2023-07-23 NOTE — Assessment & Plan Note (Signed)
bp in fair control at this time  BP Readings from Last 1 Encounters:  07/23/23 130/76   No changes needed Most recent labs reviewed  Is sedentary at age 89 , focus on quality of life  Labs ordered Plan to continue amlodipine 5 mg daily (tolerates well)

## 2023-07-23 NOTE — Assessment & Plan Note (Signed)
Trying to focus on quality of life as much as possible

## 2023-07-23 NOTE — Assessment & Plan Note (Signed)
Disc goals for lipids and reasons to control them Rev last labs with pt Rev low sat fat diet in detail  Continues low dose simvastatin  In setting of known vascular dz   Continue to focus on quality of life

## 2023-07-23 NOTE — Assessment & Plan Note (Signed)
Prefers pepcid ac to prescription pepcid  Chews one each evening  Continues omeprazole 20 mg daily

## 2023-07-24 ENCOUNTER — Ambulatory Visit: Payer: Medicare PPO

## 2023-08-01 ENCOUNTER — Telehealth: Payer: Self-pay | Admitting: Family Medicine

## 2023-08-01 NOTE — Telephone Encounter (Signed)
 Please ok that verbal order for nurse eval of pressure sores  Thanks

## 2023-08-01 NOTE — Telephone Encounter (Signed)
 [facility] is seeing patient for catheter management only. Daughter reports pressure sores on bottom. Tracy Morales is requesting verbal order for one of her nurses to go and treat. Tracy Morales believes they are stage 2 based on daughter's description.

## 2023-08-01 NOTE — Telephone Encounter (Signed)
 VO given to [facility]

## 2023-08-05 ENCOUNTER — Ambulatory Visit (INDEPENDENT_AMBULATORY_CARE_PROVIDER_SITE_OTHER): Payer: Medicare PPO | Admitting: Podiatry

## 2023-08-05 DIAGNOSIS — F32A Depression, unspecified: Secondary | ICD-10-CM | POA: Diagnosis not present

## 2023-08-05 DIAGNOSIS — I1310 Hypertensive heart and chronic kidney disease without heart failure, with stage 1 through stage 4 chronic kidney disease, or unspecified chronic kidney disease: Secondary | ICD-10-CM | POA: Diagnosis not present

## 2023-08-05 DIAGNOSIS — N1832 Chronic kidney disease, stage 3b: Secondary | ICD-10-CM | POA: Diagnosis not present

## 2023-08-05 DIAGNOSIS — M160 Bilateral primary osteoarthritis of hip: Secondary | ICD-10-CM

## 2023-08-05 DIAGNOSIS — Z91198 Patient's noncompliance with other medical treatment and regimen for other reason: Secondary | ICD-10-CM

## 2023-08-05 DIAGNOSIS — M19011 Primary osteoarthritis, right shoulder: Secondary | ICD-10-CM

## 2023-08-05 DIAGNOSIS — M353 Polymyalgia rheumatica: Secondary | ICD-10-CM | POA: Diagnosis not present

## 2023-08-05 DIAGNOSIS — D631 Anemia in chronic kidney disease: Secondary | ICD-10-CM | POA: Diagnosis not present

## 2023-08-05 DIAGNOSIS — Z466 Encounter for fitting and adjustment of urinary device: Secondary | ICD-10-CM | POA: Diagnosis not present

## 2023-08-05 DIAGNOSIS — I2510 Atherosclerotic heart disease of native coronary artery without angina pectoris: Secondary | ICD-10-CM

## 2023-08-05 DIAGNOSIS — F0393 Unspecified dementia, unspecified severity, with mood disturbance: Secondary | ICD-10-CM | POA: Diagnosis not present

## 2023-08-05 DIAGNOSIS — I739 Peripheral vascular disease, unspecified: Secondary | ICD-10-CM | POA: Diagnosis not present

## 2023-08-05 DIAGNOSIS — E440 Moderate protein-calorie malnutrition: Secondary | ICD-10-CM | POA: Diagnosis not present

## 2023-08-06 NOTE — Progress Notes (Signed)
 1. Failure to attend appointment with reason given    Patient's daughter canceled. Will call back to reschedule.

## 2023-08-09 ENCOUNTER — Telehealth: Payer: Self-pay | Admitting: Family Medicine

## 2023-08-09 ENCOUNTER — Ambulatory Visit: Admitting: Family Medicine

## 2023-08-09 ENCOUNTER — Encounter: Payer: Self-pay | Admitting: Family Medicine

## 2023-08-09 VITALS — BP 108/62 | HR 88 | Temp 97.6°F

## 2023-08-09 DIAGNOSIS — S99912A Unspecified injury of left ankle, initial encounter: Secondary | ICD-10-CM | POA: Diagnosis not present

## 2023-08-09 DIAGNOSIS — F03B18 Unspecified dementia, moderate, with other behavioral disturbance: Secondary | ICD-10-CM

## 2023-08-09 NOTE — Telephone Encounter (Signed)
 Done and in IN box   She was here today- thought they needed some sort of form Then I saw this message  Is form is needed later let me know

## 2023-08-09 NOTE — Assessment & Plan Note (Signed)
 Pt had injury with transferring into [facility] by family on Sunday Thinks the foot was hyperflexed underneath her  Today tender on calf / achilles and some swelling in foot  Pain with flex/ext and lateral movement with ankle   Xrays ordered for  [facility]  Family will take her there now

## 2023-08-09 NOTE — Assessment & Plan Note (Addendum)
 This has worsened with time / advanced age of 36  Unable to handle her finances Family has done so for 8 years Bank account was unfortunately hacked Letter done re: inability to do her own finances

## 2023-08-09 NOTE — Telephone Encounter (Signed)
 Copied from CRM [id number]. Topic: General - Other >> Aug 09, 2023 11:12 AM Truddie Crumble wrote: Reason for CRM: patient daughter called stating she need a statement from the doctor stating the patient can not handle her accounts CB [phone] ([place]erra)

## 2023-08-09 NOTE — Telephone Encounter (Signed)
Daughter notified letter ready for pick up °

## 2023-08-09 NOTE — Patient Instructions (Signed)
 I ordered xrays at [facility] [place]   Use cool compress if able  Elevate leg if able   We will reach out with results when they return and make a plan  Tylenol is ok
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
## 2023-08-19 ENCOUNTER — Telehealth: Payer: Self-pay | Admitting: *Deleted

## 2023-08-19 NOTE — Telephone Encounter (Signed)
 Aware.

## 2023-08-19 NOTE — Telephone Encounter (Signed)
 Copied from CRM [id number]. Topic: Referral - Question >> Aug 19, 2023 10:35 AM Alcus Dad wrote: Reason for CRM: Mortimer Fries from [facility] stated that they are non admitting patient because patient is already established with [facility]. Any concerns needs to be directed to [facility]

## 2023-08-21 ENCOUNTER — Telehealth: Payer: Self-pay | Admitting: Family Medicine

## 2023-08-21 NOTE — Telephone Encounter (Signed)
 Copied from CRM [id number]. Topic: General - Other >> Aug 21, 2023  3:31 PM Rodman Pickle T wrote: Reason for CRM: xray was not able to be done her leg is much better she is using her leg

## 2023-08-21 NOTE — Telephone Encounter (Signed)
 FYI to PCP

## 2023-09-05 ENCOUNTER — Telehealth: Payer: Self-pay | Admitting: *Deleted

## 2023-09-05 DIAGNOSIS — R8290 Unspecified abnormal findings in urine: Secondary | ICD-10-CM | POA: Insufficient documentation

## 2023-09-05 DIAGNOSIS — S99912A Unspecified injury of left ankle, initial encounter: Secondary | ICD-10-CM

## 2023-09-05 NOTE — Telephone Encounter (Signed)
 Called [facility] and spoke with pt's RN manager Annabelle Harman, they will get urine sample and do a UA reflex to cx and send [place] the results. They earliest they can collect sample is Monday.  Called daughter and advised her of this and Dr. Royden Purl comments to increase water intake. Also advise if sxs worsen or any new sxs to let us know.    **Daughter also wanted PCP to know that pt had another fall. They didn't get the 1st xrays of her leg because she was doing better but Saturday she had another fall and re-injured the same leg. [facility] went to [facility] where PCP ordered xrays  to try and get them done and they advise her they can't do the xray there either if pt can't get on the table. Camelia Eng is seeing if any other imaging ctrs can do xrays with her in a wheelchair**

## 2023-09-05 NOTE — Telephone Encounter (Signed)
 Thanks/ keep me posted   ER if symptoms worsen / EMS if they cannot get her there

## 2023-09-05 NOTE — Telephone Encounter (Signed)
 Bring a sample if possible Encourage fluids   Order for lab urinalysis and reflex culture is in   Let me know if other symptoms develop   Please give ER precautions

## 2023-09-05 NOTE — Telephone Encounter (Signed)
 Copied from CRM [id number]. Topic: General - Other >> Sep 05, 2023  1:01 PM Tracy Morales wrote: Reason for CRM: Patient is having strong urine smell and its also darker. Patient just wants to inform Dr.Tower with this concern.

## 2023-09-06 NOTE — Telephone Encounter (Signed)
 Orders are in

## 2023-09-06 NOTE — Telephone Encounter (Signed)
 Daughter notified and instructions given on where to take pt for xray

## 2023-09-06 NOTE — Telephone Encounter (Signed)
 [facility] hospital said they can do the Xrays, please change orders and I will inform pt's family

## 2023-09-12 ENCOUNTER — Telehealth: Payer: Self-pay | Admitting: *Deleted

## 2023-09-12 ENCOUNTER — Other Ambulatory Visit: Payer: Self-pay | Admitting: Family Medicine

## 2023-09-12 NOTE — Telephone Encounter (Signed)
 FYI to PCP

## 2023-09-12 NOTE — Telephone Encounter (Signed)
 Copied from CRM [id number]. Topic: Clinical - Lab/Test Results >> Sep 12, 2023  4:12 PM [place] J wrote: Reason for CRM: The site manager at [facility] wanted to let Anvitha Hutmacher know that the patient was recently seen today and the urine analysis will not be completed until tomorrow and the urine culture will be back Monday.

## 2023-09-16 ENCOUNTER — Telehealth: Payer: Self-pay | Admitting: Family Medicine

## 2023-09-16 MED ORDER — CEPHALEXIN 500 MG PO CAPS
500.0000 mg | ORAL_CAPSULE | Freq: Two times a day (BID) | ORAL | 0 refills | Status: DC
Start: 2023-09-16 — End: 2024-01-15

## 2023-09-16 NOTE — Telephone Encounter (Signed)
 Called daughter and no answer so left VM requesting her to call the office back

## 2023-09-16 NOTE — Telephone Encounter (Signed)
 Urine culture returned  Klebsiella uti   I want her to start keflex -sent to her local pharmacy  Let us  know if no improvement  Encourage fluids when able

## 2023-09-17 NOTE — Telephone Encounter (Signed)
 Daughter notified of urine cx results and Dr. Belva Boyden comments. She will get med now and keep us  posted

## 2023-09-20 ENCOUNTER — Ambulatory Visit
Admission: RE | Admit: 2023-09-20 | Discharge: 2023-09-20 | Disposition: A | Discharge status: Home / Self Care | Source: Ambulatory Visit | Attending: Family Medicine | Admitting: Family Medicine

## 2023-09-20 ENCOUNTER — Ambulatory Visit
Admission: RE | Admit: 2023-09-20 | Discharge: 2023-09-20 | Disposition: A | Discharge status: Home / Self Care | Source: Ambulatory Visit | Attending: Family Medicine | Admitting: *Deleted

## 2023-09-20 DIAGNOSIS — S82402D Unspecified fracture of shaft of left fibula, subsequent encounter for closed fracture with routine healing: Secondary | ICD-10-CM | POA: Diagnosis not present

## 2023-09-20 DIAGNOSIS — M85862 Other specified disorders of bone density and structure, left lower leg: Secondary | ICD-10-CM | POA: Diagnosis not present

## 2023-09-20 DIAGNOSIS — M85872 Other specified disorders of bone density and structure, left ankle and foot: Secondary | ICD-10-CM | POA: Diagnosis not present

## 2023-09-20 DIAGNOSIS — S99912A Unspecified injury of left ankle, initial encounter: Secondary | ICD-10-CM | POA: Diagnosis not present

## 2023-09-20 DIAGNOSIS — S82252D Displaced comminuted fracture of shaft of left tibia, subsequent encounter for closed fracture with routine healing: Secondary | ICD-10-CM | POA: Diagnosis not present

## 2023-09-24 ENCOUNTER — Telehealth: Payer: Self-pay | Admitting: *Deleted

## 2023-09-24 ENCOUNTER — Telehealth: Payer: Self-pay | Admitting: Family Medicine

## 2023-09-24 DIAGNOSIS — S82202S Unspecified fracture of shaft of left tibia, sequela: Secondary | ICD-10-CM

## 2023-09-24 DIAGNOSIS — S82209A Unspecified fracture of shaft of unspecified tibia, initial encounter for closed fracture: Secondary | ICD-10-CM | POA: Insufficient documentation

## 2023-09-24 DIAGNOSIS — S99912A Unspecified injury of left ankle, initial encounter: Secondary | ICD-10-CM

## 2023-09-24 NOTE — Telephone Encounter (Signed)
 Referral dpt notified

## 2023-09-24 NOTE — Telephone Encounter (Signed)
Please ok that verbal order Thanks  

## 2023-09-24 NOTE — Telephone Encounter (Signed)
-----   Message from [facility] Sh[place] W sent at 09/24/2023 12:39 PM EDT ----- Pt's daughter notified of xray results and Dr. Belva Boyden comments. They said it's not really swollen but pt does complain of pain around her ankle area when they move her. Given it's still bothering pt they do want to proceed with ortho referral. They would like to see someone in [place]. Daughter advise PCP will put referral in and she will get a call

## 2023-09-24 NOTE — Telephone Encounter (Signed)
 I labeled referral as urgent  Please let referral team know  Thanks

## 2023-09-24 NOTE — Telephone Encounter (Signed)
 Copied from CRM [id number]. Topic: General - Other >> Sep 24, 2023 10:38 AM Annelle Kiel wrote: Reason for CRM: angela from [facility] is calling in requesting orders to continue changing out patient cathter every month

## 2023-09-25 NOTE — Telephone Encounter (Signed)
 Grandson picked up imaging disc

## 2023-09-26 DIAGNOSIS — S82402A Unspecified fracture of shaft of left fibula, initial encounter for closed fracture: Secondary | ICD-10-CM | POA: Diagnosis not present

## 2023-09-26 DIAGNOSIS — S82202A Unspecified fracture of shaft of left tibia, initial encounter for closed fracture: Secondary | ICD-10-CM | POA: Diagnosis not present

## 2023-09-30 ENCOUNTER — Telehealth: Payer: Self-pay | Admitting: Family Medicine

## 2023-09-30 NOTE — Telephone Encounter (Signed)
 No phone # was provided by call ctr. If they call back please update a # to reach them back at

## 2023-09-30 NOTE — Telephone Encounter (Signed)
 Form in your inbox

## 2023-09-30 NOTE — Telephone Encounter (Signed)
 Faxed form and also called nurse PJ Livengood. She said VO was fine and they will keep PCP as the Attending

## 2023-09-30 NOTE — Telephone Encounter (Signed)
 Tracy Morales is ok  I can be attending  Do they need order in computer?

## 2023-09-30 NOTE — Telephone Encounter (Signed)
 Copied from CRM [id number]. Topic: General - Other >> Sep 26, 2023 11:11 AM Orien Bird wrote: Reason for CRM: [facility] is calling because they are needing a referral for hospice their fax number [phone] and callback [phone] PJ is the name. >> Sep 30, 2023 11:02 AM Emylou G wrote: Tracy Morales w/[facility] referral.. is it okay to transition patient from Paliative care to Hospice.. [phone] pls call

## 2023-10-06 ENCOUNTER — Other Ambulatory Visit: Payer: Self-pay | Admitting: Family Medicine

## 2023-10-07 NOTE — Telephone Encounter (Signed)
 Remeron  last filled on 07/10/23 #90 caps/ 0 refill  Omeprazole  filled on 04/15/23 #90 caps/ 1 refill

## 2023-10-11 DIAGNOSIS — M19011 Primary osteoarthritis, right shoulder: Secondary | ICD-10-CM

## 2023-10-11 DIAGNOSIS — I2510 Atherosclerotic heart disease of native coronary artery without angina pectoris: Secondary | ICD-10-CM

## 2023-10-11 DIAGNOSIS — M160 Bilateral primary osteoarthritis of hip: Secondary | ICD-10-CM

## 2023-10-11 DIAGNOSIS — M353 Polymyalgia rheumatica: Secondary | ICD-10-CM | POA: Diagnosis not present

## 2023-10-11 DIAGNOSIS — F32A Depression, unspecified: Secondary | ICD-10-CM | POA: Diagnosis not present

## 2023-10-11 DIAGNOSIS — I739 Peripheral vascular disease, unspecified: Secondary | ICD-10-CM | POA: Diagnosis not present

## 2023-10-11 DIAGNOSIS — Z466 Encounter for fitting and adjustment of urinary device: Secondary | ICD-10-CM | POA: Diagnosis not present

## 2023-10-11 DIAGNOSIS — I1310 Hypertensive heart and chronic kidney disease without heart failure, with stage 1 through stage 4 chronic kidney disease, or unspecified chronic kidney disease: Secondary | ICD-10-CM | POA: Diagnosis not present

## 2023-10-11 DIAGNOSIS — E440 Moderate protein-calorie malnutrition: Secondary | ICD-10-CM | POA: Diagnosis not present

## 2023-10-11 DIAGNOSIS — D631 Anemia in chronic kidney disease: Secondary | ICD-10-CM | POA: Diagnosis not present

## 2023-10-11 DIAGNOSIS — N1832 Chronic kidney disease, stage 3b: Secondary | ICD-10-CM | POA: Diagnosis not present

## 2023-10-11 DIAGNOSIS — F0393 Unspecified dementia, unspecified severity, with mood disturbance: Secondary | ICD-10-CM | POA: Diagnosis not present

## 2023-10-21 ENCOUNTER — Other Ambulatory Visit: Payer: Self-pay | Admitting: Family Medicine

## 2023-10-29 DIAGNOSIS — S82402A Unspecified fracture of shaft of left fibula, initial encounter for closed fracture: Secondary | ICD-10-CM | POA: Diagnosis not present

## 2023-10-29 DIAGNOSIS — S82202A Unspecified fracture of shaft of left tibia, initial encounter for closed fracture: Secondary | ICD-10-CM | POA: Diagnosis not present

## 2023-11-10 ENCOUNTER — Other Ambulatory Visit: Payer: Self-pay | Admitting: Family Medicine

## 2024-01-15 ENCOUNTER — Telehealth: Payer: Self-pay

## 2024-01-15 ENCOUNTER — Telehealth: Payer: Self-pay | Admitting: *Deleted

## 2024-01-15 NOTE — Telephone Encounter (Signed)
 Copied from CRM [id number]. Topic: General - Other >> Jan 15, 2024  9:37 AM Burnard DEL wrote: Reason for CRM: Patients daughter Lyna  called to let Dr Randeen know that patient her mom passed away yesterday.

## 2024-01-15 NOTE — Telephone Encounter (Signed)
 Status updated. Routing to PCP as a Financial planner

## 2024-01-15 NOTE — Telephone Encounter (Signed)
 Duplicate message

## 2024-02-03 DEATH — deceased

## 2024-06-01 ENCOUNTER — Ambulatory Visit: Payer: Self-pay | Admitting: Physician Assistant

## 2024-07-15 ENCOUNTER — Ambulatory Visit: Payer: Medicare PPO
